# Patient Record
Sex: Female | Born: 1950 | Race: White | Hispanic: No | Marital: Married | Smoking: Former smoker
Health system: Southern US, Community
[De-identification: ages and names within clinical notes are randomized; demographics above are authoritative.]

## PROBLEM LIST (undated history)

## (undated) DIAGNOSIS — K5792 Diverticulitis of intestine, part unspecified, without perforation or abscess without bleeding: Secondary | ICD-10-CM

## (undated) DIAGNOSIS — R3915 Urgency of urination: Secondary | ICD-10-CM

## (undated) DIAGNOSIS — G43909 Migraine, unspecified, not intractable, without status migrainosus: Secondary | ICD-10-CM

## (undated) DIAGNOSIS — R05 Cough: Principal | ICD-10-CM

## (undated) DIAGNOSIS — N281 Cyst of kidney, acquired: Secondary | ICD-10-CM

## (undated) DIAGNOSIS — F419 Anxiety disorder, unspecified: Secondary | ICD-10-CM

## (undated) DIAGNOSIS — N390 Urinary tract infection, site not specified: Secondary | ICD-10-CM

## (undated) DIAGNOSIS — M25562 Pain in left knee: Secondary | ICD-10-CM

## (undated) DIAGNOSIS — K5790 Diverticulosis of intestine, part unspecified, without perforation or abscess without bleeding: Secondary | ICD-10-CM

## (undated) DIAGNOSIS — K589 Irritable bowel syndrome without diarrhea: Secondary | ICD-10-CM

## (undated) DIAGNOSIS — B977 Papillomavirus as the cause of diseases classified elsewhere: Secondary | ICD-10-CM

## (undated) DIAGNOSIS — D126 Benign neoplasm of colon, unspecified: Secondary | ICD-10-CM

## (undated) DIAGNOSIS — K219 Gastro-esophageal reflux disease without esophagitis: Secondary | ICD-10-CM

## (undated) DIAGNOSIS — M1990 Unspecified osteoarthritis, unspecified site: Secondary | ICD-10-CM

## (undated) DIAGNOSIS — N200 Calculus of kidney: Secondary | ICD-10-CM

## (undated) DIAGNOSIS — D689 Coagulation defect, unspecified: Secondary | ICD-10-CM

## (undated) DIAGNOSIS — M25561 Pain in right knee: Secondary | ICD-10-CM

## (undated) DIAGNOSIS — T4145XA Adverse effect of unspecified anesthetic, initial encounter: Secondary | ICD-10-CM

## (undated) DIAGNOSIS — IMO0001 Reserved for inherently not codable concepts without codable children: Secondary | ICD-10-CM

## (undated) DIAGNOSIS — T7840XA Allergy, unspecified, initial encounter: Secondary | ICD-10-CM

## (undated) DIAGNOSIS — K649 Unspecified hemorrhoids: Secondary | ICD-10-CM

## (undated) DIAGNOSIS — N319 Neuromuscular dysfunction of bladder, unspecified: Secondary | ICD-10-CM

## (undated) DIAGNOSIS — Z9889 Other specified postprocedural states: Secondary | ICD-10-CM

## (undated) DIAGNOSIS — Z87442 Personal history of urinary calculi: Secondary | ICD-10-CM

## (undated) DIAGNOSIS — C801 Malignant (primary) neoplasm, unspecified: Secondary | ICD-10-CM

## (undated) DIAGNOSIS — D649 Anemia, unspecified: Secondary | ICD-10-CM

## (undated) DIAGNOSIS — A419 Sepsis, unspecified organism: Secondary | ICD-10-CM

## (undated) DIAGNOSIS — F32A Depression, unspecified: Secondary | ICD-10-CM

## (undated) DIAGNOSIS — E785 Hyperlipidemia, unspecified: Secondary | ICD-10-CM

## (undated) DIAGNOSIS — L709 Acne, unspecified: Secondary | ICD-10-CM

## (undated) DIAGNOSIS — Z780 Asymptomatic menopausal state: Secondary | ICD-10-CM

## (undated) DIAGNOSIS — T8859XA Other complications of anesthesia, initial encounter: Secondary | ICD-10-CM

## (undated) DIAGNOSIS — Z85820 Personal history of malignant melanoma of skin: Secondary | ICD-10-CM

## (undated) DIAGNOSIS — B029 Zoster without complications: Secondary | ICD-10-CM

## (undated) DIAGNOSIS — M779 Enthesopathy, unspecified: Secondary | ICD-10-CM

## (undated) DIAGNOSIS — E079 Disorder of thyroid, unspecified: Secondary | ICD-10-CM

## (undated) DIAGNOSIS — R112 Nausea with vomiting, unspecified: Secondary | ICD-10-CM

## (undated) HISTORY — DX: Pain in left knee: M25.562

## (undated) HISTORY — DX: Pain in right knee: M25.561

## (undated) HISTORY — DX: Acne, unspecified: L70.9

## (undated) HISTORY — DX: Diverticulitis of intestine, part unspecified, without perforation or abscess without bleeding: K57.92

## (undated) HISTORY — DX: Unspecified osteoarthritis, unspecified site: M19.90

## (undated) HISTORY — DX: Unspecified hemorrhoids: K64.9

## (undated) HISTORY — PX: JOINT REPLACEMENT: SHX530

## (undated) HISTORY — DX: Zoster without complications: B02.9

## (undated) HISTORY — PX: SPINE SURGERY: SHX786

## (undated) HISTORY — DX: Papillomavirus as the cause of diseases classified elsewhere: B97.7

## (undated) HISTORY — DX: Asymptomatic menopausal state: Z78.0

## (undated) HISTORY — DX: Allergy, unspecified, initial encounter: T78.40XA

## (undated) HISTORY — DX: Calculus of kidney: N20.0

## (undated) HISTORY — DX: Hyperlipidemia, unspecified: E78.5

## (undated) HISTORY — DX: Gastro-esophageal reflux disease without esophagitis: K21.9

## (undated) HISTORY — PX: CARPAL TUNNEL RELEASE: SHX101

## (undated) HISTORY — DX: Urinary tract infection, site not specified: N39.0

## (undated) HISTORY — DX: Depression, unspecified: F32.A

## (undated) HISTORY — DX: Coagulation defect, unspecified: D68.9

## (undated) HISTORY — PX: TRIGGER FINGER RELEASE: SHX641

## (undated) HISTORY — DX: Diverticulosis of intestine, part unspecified, without perforation or abscess without bleeding: K57.90

## (undated) HISTORY — DX: Anxiety disorder, unspecified: F41.9

## (undated) HISTORY — DX: Malignant (primary) neoplasm, unspecified: C80.1

## (undated) HISTORY — DX: Anemia, unspecified: D64.9

## (undated) HISTORY — DX: Disorder of thyroid, unspecified: E07.9

## (undated) HISTORY — DX: Irritable bowel syndrome, unspecified: K58.9

## (undated) HISTORY — PX: HEMORRHOID BANDING: SHX5850

## (undated) HISTORY — DX: Benign neoplasm of colon, unspecified: D12.6

## (undated) HISTORY — DX: Migraine, unspecified, not intractable, without status migrainosus: G43.909

---

## 1985-10-30 HISTORY — PX: FOOT SURGERY: SHX648

## 1988-01-29 HISTORY — PX: TUBAL LIGATION: SHX77

## 1990-10-30 HISTORY — PX: NASAL SEPTUM SURGERY: SHX37

## 1991-10-31 HISTORY — PX: LUMBAR LAMINECTOMY: SHX95

## 1991-10-31 HISTORY — PX: BACK SURGERY: SHX140

## 1999-06-06 ENCOUNTER — Other Ambulatory Visit: Admission: RE | Admit: 1999-06-06 | Discharge: 1999-06-06 | Payer: Self-pay | Admitting: Gynecology

## 2000-06-19 ENCOUNTER — Other Ambulatory Visit: Admission: RE | Admit: 2000-06-19 | Discharge: 2000-06-19 | Payer: Self-pay | Admitting: Gynecology

## 2000-07-06 ENCOUNTER — Other Ambulatory Visit: Admission: RE | Admit: 2000-07-06 | Discharge: 2000-07-06 | Payer: Self-pay | Admitting: Gynecology

## 2000-07-06 ENCOUNTER — Encounter (INDEPENDENT_AMBULATORY_CARE_PROVIDER_SITE_OTHER): Payer: Self-pay

## 2000-11-05 ENCOUNTER — Other Ambulatory Visit: Admission: RE | Admit: 2000-11-05 | Discharge: 2000-11-05 | Payer: Self-pay | Admitting: Gynecology

## 2001-07-22 ENCOUNTER — Other Ambulatory Visit: Admission: RE | Admit: 2001-07-22 | Discharge: 2001-07-22 | Payer: Self-pay | Admitting: Gynecology

## 2001-12-31 ENCOUNTER — Other Ambulatory Visit: Admission: RE | Admit: 2001-12-31 | Discharge: 2001-12-31 | Payer: Self-pay | Admitting: Gynecology

## 2002-04-03 ENCOUNTER — Other Ambulatory Visit: Admission: RE | Admit: 2002-04-03 | Discharge: 2002-04-03 | Payer: Self-pay | Admitting: Gynecology

## 2002-08-29 ENCOUNTER — Encounter: Admission: RE | Admit: 2002-08-29 | Discharge: 2002-08-29 | Payer: Self-pay | Admitting: Family Medicine

## 2002-08-29 ENCOUNTER — Encounter: Payer: Self-pay | Admitting: Family Medicine

## 2002-09-02 ENCOUNTER — Other Ambulatory Visit: Admission: RE | Admit: 2002-09-02 | Discharge: 2002-09-02 | Payer: Self-pay | Admitting: Gynecology

## 2002-10-30 DIAGNOSIS — D126 Benign neoplasm of colon, unspecified: Secondary | ICD-10-CM

## 2002-10-30 HISTORY — DX: Benign neoplasm of colon, unspecified: D12.6

## 2003-09-16 ENCOUNTER — Other Ambulatory Visit: Admission: RE | Admit: 2003-09-16 | Discharge: 2003-09-16 | Payer: Self-pay | Admitting: Gynecology

## 2003-10-31 HISTORY — PX: KIDNEY STONE SURGERY: SHX686

## 2003-11-23 ENCOUNTER — Inpatient Hospital Stay (HOSPITAL_COMMUNITY): Admission: AD | Admit: 2003-11-23 | Discharge: 2003-11-27 | Payer: Self-pay | Admitting: Family Medicine

## 2003-12-29 HISTORY — PX: KIDNEY STONE SURGERY: SHX686

## 2004-01-04 ENCOUNTER — Emergency Department (HOSPITAL_COMMUNITY): Admission: EM | Admit: 2004-01-04 | Discharge: 2004-01-04 | Payer: Self-pay | Admitting: Emergency Medicine

## 2004-01-06 ENCOUNTER — Ambulatory Visit (HOSPITAL_BASED_OUTPATIENT_CLINIC_OR_DEPARTMENT_OTHER): Admission: RE | Admit: 2004-01-06 | Discharge: 2004-01-06 | Payer: Self-pay | Admitting: Urology

## 2004-08-17 ENCOUNTER — Ambulatory Visit: Payer: Self-pay | Admitting: Family Medicine

## 2004-09-08 ENCOUNTER — Ambulatory Visit: Payer: Self-pay | Admitting: Family Medicine

## 2004-09-15 ENCOUNTER — Ambulatory Visit: Payer: Self-pay

## 2004-09-19 ENCOUNTER — Other Ambulatory Visit: Admission: RE | Admit: 2004-09-19 | Discharge: 2004-09-19 | Payer: Self-pay | Admitting: Gynecology

## 2004-11-09 ENCOUNTER — Ambulatory Visit: Payer: Self-pay | Admitting: Family Medicine

## 2004-11-18 ENCOUNTER — Ambulatory Visit: Payer: Self-pay | Admitting: Family Medicine

## 2005-01-06 ENCOUNTER — Other Ambulatory Visit: Admission: RE | Admit: 2005-01-06 | Discharge: 2005-01-06 | Payer: Self-pay | Admitting: Gynecology

## 2005-01-30 ENCOUNTER — Ambulatory Visit: Payer: Self-pay | Admitting: Internal Medicine

## 2005-03-23 ENCOUNTER — Ambulatory Visit: Payer: Self-pay | Admitting: Family Medicine

## 2005-03-23 ENCOUNTER — Encounter: Admission: RE | Admit: 2005-03-23 | Discharge: 2005-03-23 | Payer: Self-pay | Admitting: Family Medicine

## 2005-04-28 ENCOUNTER — Ambulatory Visit: Payer: Self-pay | Admitting: Family Medicine

## 2005-06-19 ENCOUNTER — Ambulatory Visit: Payer: Self-pay | Admitting: Family Medicine

## 2005-07-04 ENCOUNTER — Ambulatory Visit: Payer: Self-pay | Admitting: Family Medicine

## 2005-10-09 ENCOUNTER — Other Ambulatory Visit: Admission: RE | Admit: 2005-10-09 | Discharge: 2005-10-09 | Payer: Self-pay | Admitting: Gynecology

## 2005-10-10 ENCOUNTER — Encounter: Admission: RE | Admit: 2005-10-10 | Discharge: 2005-10-10 | Payer: Self-pay | Admitting: Family Medicine

## 2005-10-30 HISTORY — PX: MELANOMA EXCISION: SHX5266

## 2005-10-30 HISTORY — PX: LITHOTRIPSY: SUR834

## 2005-11-22 IMAGING — CT CT ABDOMEN W/ CM
2 of 3 series · 13 of 32 positions shown, 19 images · IV contrast (omnipaque)
Comparison: none

CLINICAL DATA: Fever, nausea, vomiting, and abdominal pain.  Sinus drainage.
 LIMITED CT OF THE PARANASAL SINUSES
 Prone coronal images of the paranasal sinuses demonstrate very minimal mucosal thickening of the right maxillary sinus.  There is an air fluid level in the left maxillary sinus.  There is mucosal thickening of the left ostiomeatal unit and some mucosal thickening in the left ethmoid sinus.  No bone abnormality is present.  The sphenoid and frontal sinuses are normal.
 IMPRESSION
 1.  Air fluid level in the left maxillary sinus with mucosal disease in the left ethmoid and right maxillary sinuses.
 2.  Considerable mucosal change of the ostiomeatal unit on the left.
 CT SCAN OF THE ABDOMEN WITH CONTRAST
 Multiple spiral images were made through abdomen after intravenous injection of 150 cc of Omnipaque 300.  Lung bases are normal.  There is some linear scarring.  There are subcentimeter low density lesions of the medial and lateral segments of the left lobe of the liver.  There is a single subcentimeter lesion of the superior aspect of the left kidney.  There is no adenopathy, free air, or free fluid.  The spleen, pancreas and adrenal glands are normal.  There is no abnormality of the kidneys other than the small cyst.  There is no inflammatory change within the abdomen.  
 Negative CT scan of the abdomen with contrast.  No evidence for diverticulitis or acute abnormality.
 CT SCAN OF THE PELVIS WITH CONTRAST
 Additional images through the pelvis after oral and intravenous contrast demonstrate no diverticulitis.  There is no free air or free fluid.  The appendix is normal.  The uterus is normal.  There is a 1.9 cm cyst of the left ovary.  The sigmoid colon is unremarkable.
 Negative CT scan of the pelvis with contrast.  No evidence for active diverticulitis.

[Series 4: sinus ltd 2.5 h70h · axial · 0.18mm/px · z∈[-165,-75]mm · 9 of 44 slices shown, 15 images (1 of 2)]
[im 5/44  soft-tissue]
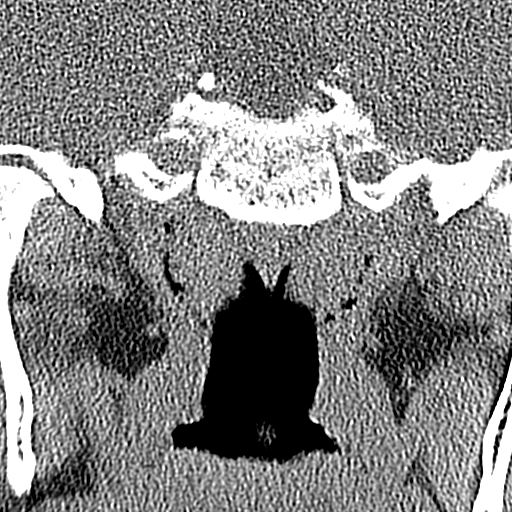
[im 5/44  bone]
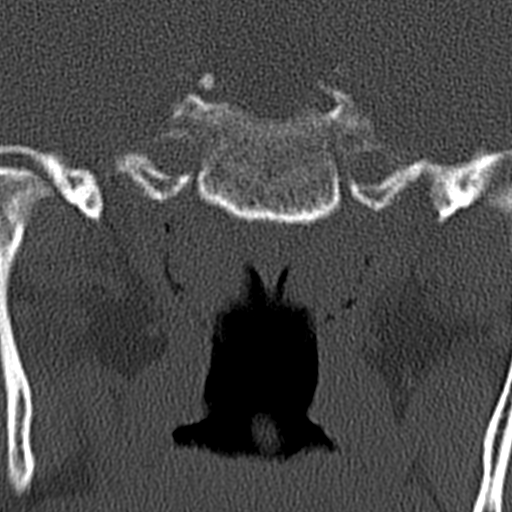
[im 9/44  soft-tissue]
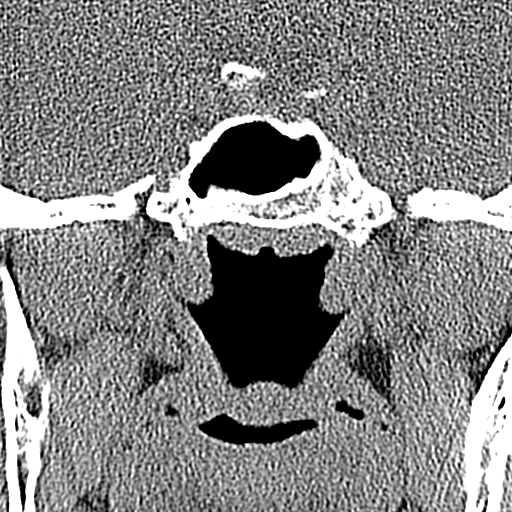
[im 13/44  soft-tissue]
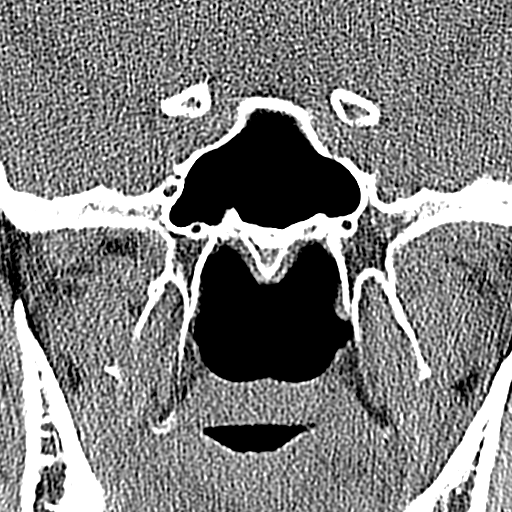
[im 18/44  soft-tissue]
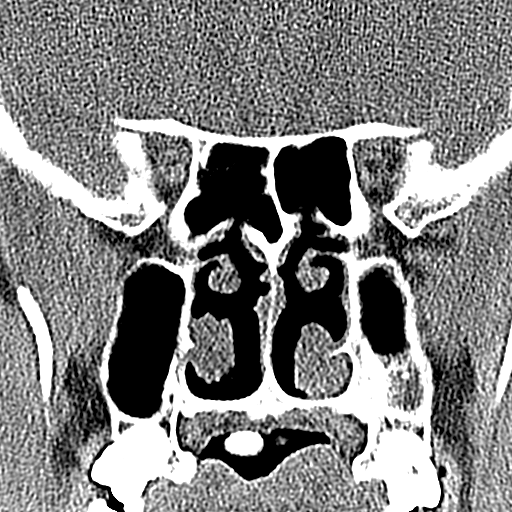
[im 22/44  soft-tissue]
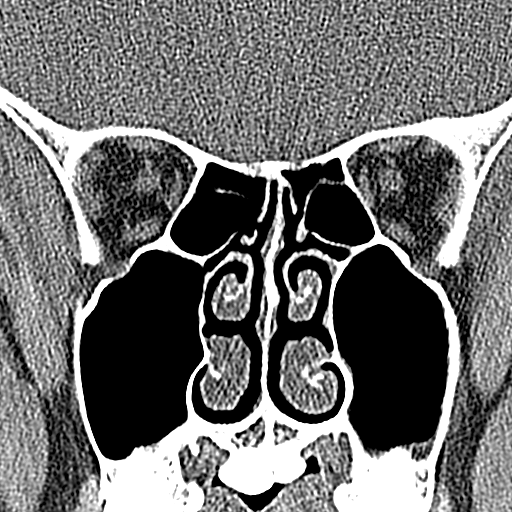
[im 26/44  soft-tissue]
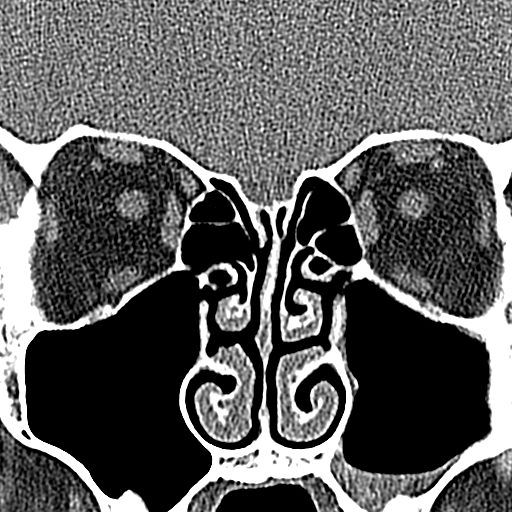
[im 26/44  lung]
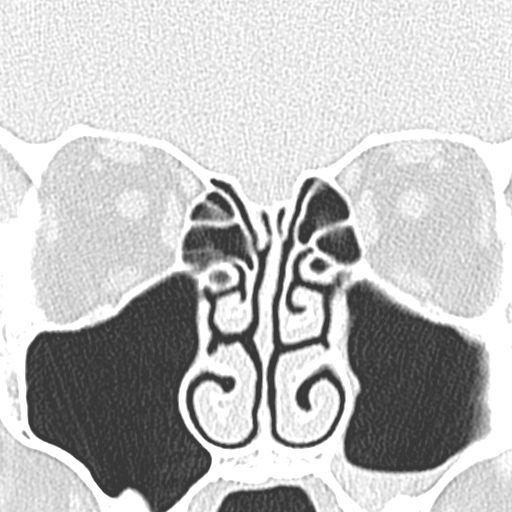
[im 31/44  soft-tissue]
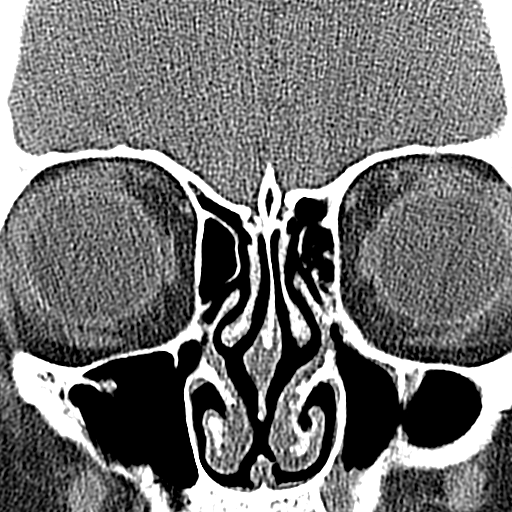
[im 31/44  lung]
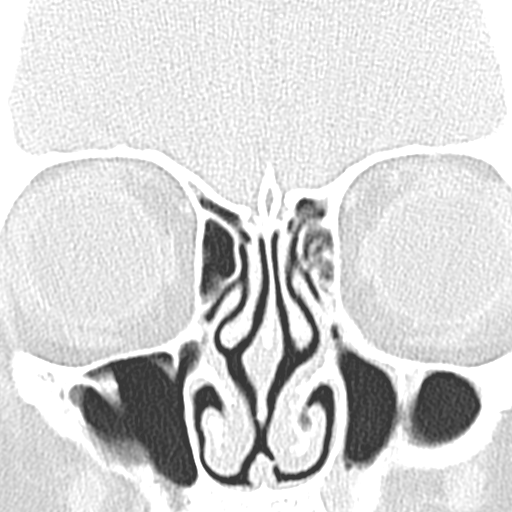
[im 35/44  soft-tissue]
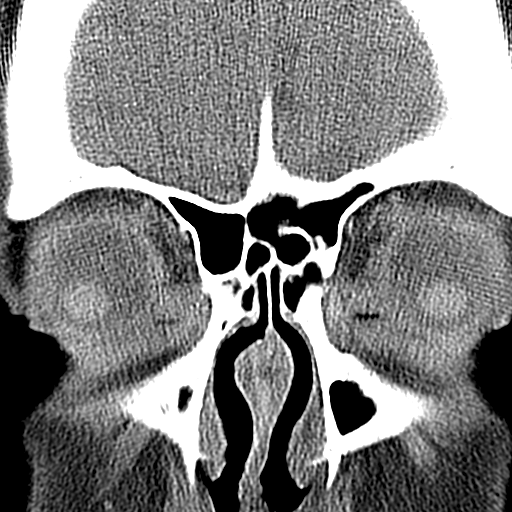
[im 35/44  lung]
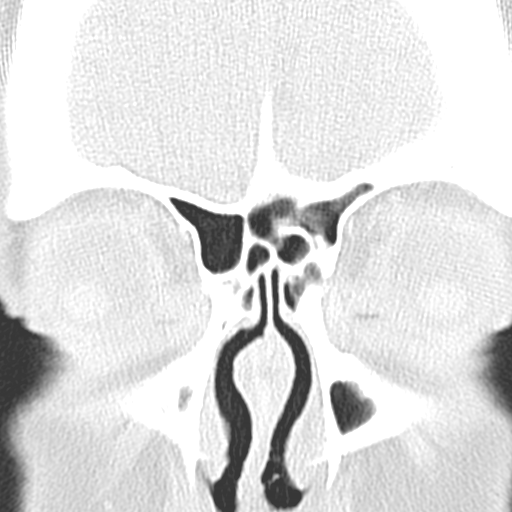
[im 39/44  soft-tissue]
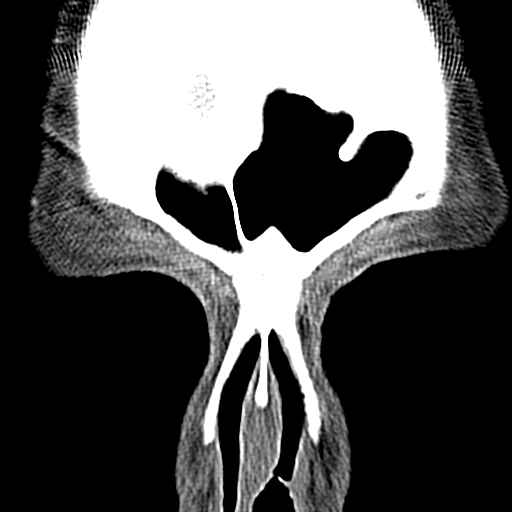
[im 39/44  lung]
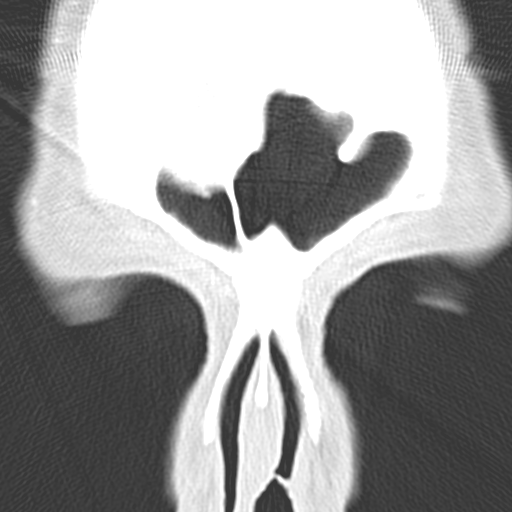
[im 39/44  bone]
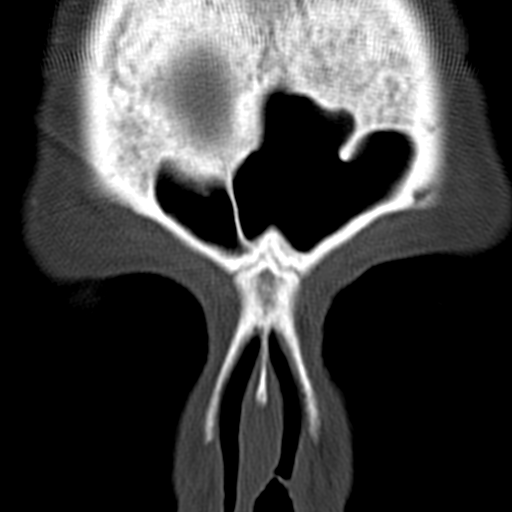

[Series 5: sinus ltd 2.5 h70h · axial · 0.18mm/px · z∈[-164,-130]mm · 4 of 44 slices shown (2 of 2)]
[im 5/44  soft-tissue]
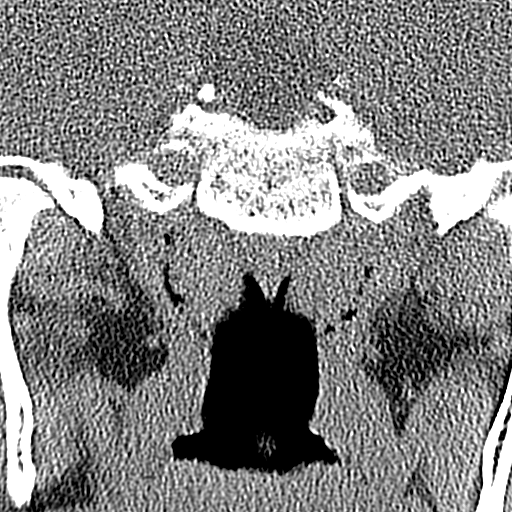
[im 9/44  soft-tissue]
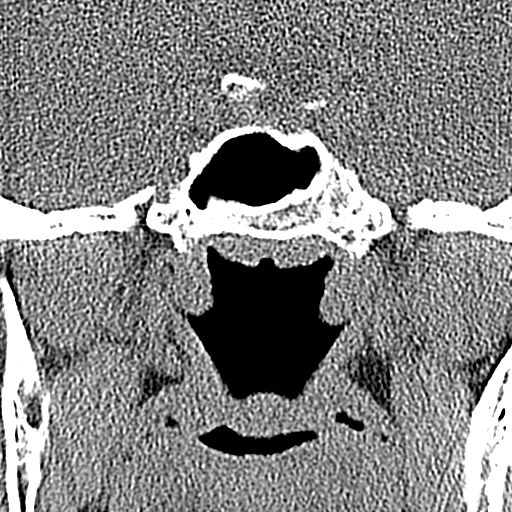
[im 13/44  soft-tissue]
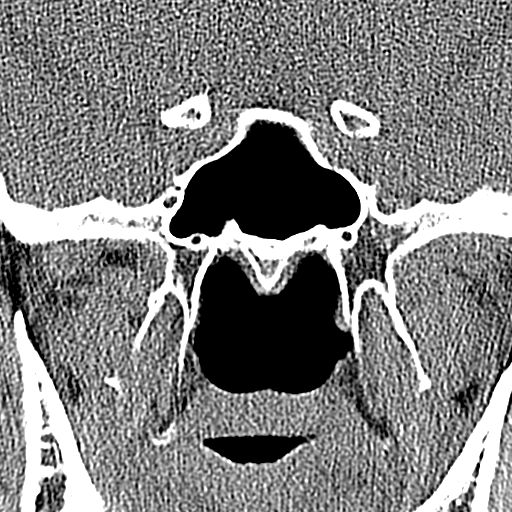
[im 18/44  soft-tissue]
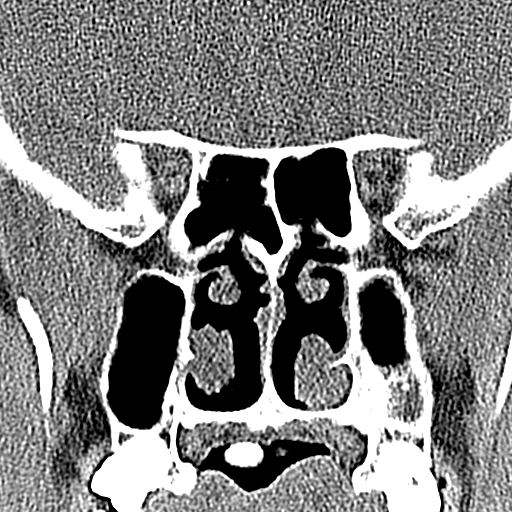

[13 of 32 positions shown; findings below may reference images not displayed]

## 2005-12-21 ENCOUNTER — Ambulatory Visit: Payer: Self-pay | Admitting: Family Medicine

## 2006-01-28 DIAGNOSIS — D0361 Melanoma in situ of right upper limb, including shoulder: Secondary | ICD-10-CM | POA: Insufficient documentation

## 2006-02-06 ENCOUNTER — Emergency Department (HOSPITAL_COMMUNITY): Admission: EM | Admit: 2006-02-06 | Discharge: 2006-02-07 | Payer: Self-pay | Admitting: Emergency Medicine

## 2006-02-07 ENCOUNTER — Ambulatory Visit (HOSPITAL_BASED_OUTPATIENT_CLINIC_OR_DEPARTMENT_OTHER): Admission: RE | Admit: 2006-02-07 | Discharge: 2006-02-07 | Payer: Self-pay | Admitting: Urology

## 2006-02-08 ENCOUNTER — Ambulatory Visit (HOSPITAL_COMMUNITY): Admission: AD | Admit: 2006-02-08 | Discharge: 2006-02-08 | Payer: Self-pay | Admitting: Urology

## 2006-08-13 ENCOUNTER — Ambulatory Visit: Payer: Self-pay | Admitting: Family Medicine

## 2006-09-03 ENCOUNTER — Ambulatory Visit: Payer: Self-pay | Admitting: Family Medicine

## 2006-10-05 ENCOUNTER — Ambulatory Visit: Payer: Self-pay | Admitting: Family Medicine

## 2006-10-05 LAB — CONVERTED CEMR LAB
ALT: 25 units/L (ref 0–40)
AST: 24 units/L (ref 0–37)
Albumin: 4 g/dL (ref 3.5–5.2)
Alkaline Phosphatase: 51 units/L (ref 39–117)
BUN: 18 mg/dL (ref 6–23)
Basophils Absolute: 0 10*3/uL (ref 0.0–0.1)
Basophils Relative: 0.3 % (ref 0.0–1.0)
CO2: 30 meq/L (ref 19–32)
Calcium: 9.5 mg/dL (ref 8.4–10.5)
Chloride: 107 meq/L (ref 96–112)
Chol/HDL Ratio, serum: 4.8
Cholesterol: 228 mg/dL (ref 0–200)
Creatinine, Ser: 0.9 mg/dL (ref 0.4–1.2)
Eosinophil percent: 1.2 % (ref 0.0–5.0)
GFR calc non Af Amer: 69 mL/min
Glomerular Filtration Rate, Af Am: 84 mL/min/{1.73_m2}
Glucose, Bld: 87 mg/dL (ref 70–99)
HCT: 44.4 % (ref 36.0–46.0)
HDL: 47.6 mg/dL (ref >39.0)
Hemoglobin: 14.5 g/dL (ref 12.0–15.0)
LDL DIRECT: 177.8 mg/dL
Lymphocytes Relative: 30.6 % (ref 12.0–46.0)
MCHC: 32.7 g/dL (ref 30.0–36.0)
MCV: 91.6 fL (ref 78.0–100.0)
Monocytes Absolute: 0.6 10*3/uL (ref 0.2–0.7)
Monocytes Relative: 11.7 % — ABNORMAL HIGH (ref 3.0–11.0)
Neutro Abs: 2.7 10*3/uL (ref 1.4–7.7)
Neutrophils Relative %: 56.2 % (ref 43.0–77.0)
Platelets: 210 10*3/uL (ref 150–400)
Potassium: 4.5 meq/L (ref 3.5–5.1)
RBC: 4.85 M/uL (ref 3.87–5.11)
RDW: 12.6 % (ref 11.5–14.6)
Sodium: 144 meq/L (ref 135–145)
TSH: 2.64 microintl units/mL (ref 0.35–5.50)
Total Bilirubin: 0.8 mg/dL (ref 0.3–1.2)
Total Protein: 6.9 g/dL (ref 6.0–8.3)
Triglyceride fasting, serum: 91 mg/dL (ref 0–149)
VLDL: 18 mg/dL (ref 0–40)
WBC: 4.9 10*3/uL (ref 4.5–10.5)

## 2006-10-10 ENCOUNTER — Other Ambulatory Visit: Admission: RE | Admit: 2006-10-10 | Discharge: 2006-10-10 | Payer: Self-pay | Admitting: Gynecology

## 2006-10-12 ENCOUNTER — Ambulatory Visit: Payer: Self-pay | Admitting: Family Medicine

## 2006-11-28 ENCOUNTER — Ambulatory Visit: Payer: Self-pay | Admitting: Internal Medicine

## 2006-12-18 ENCOUNTER — Ambulatory Visit: Payer: Self-pay | Admitting: Internal Medicine

## 2007-06-19 DIAGNOSIS — K219 Gastro-esophageal reflux disease without esophagitis: Secondary | ICD-10-CM | POA: Insufficient documentation

## 2007-06-19 DIAGNOSIS — J309 Allergic rhinitis, unspecified: Secondary | ICD-10-CM | POA: Insufficient documentation

## 2007-07-16 ENCOUNTER — Encounter: Payer: Self-pay | Admitting: Family Medicine

## 2007-07-22 ENCOUNTER — Encounter: Payer: Self-pay | Admitting: Family Medicine

## 2007-07-22 ENCOUNTER — Ambulatory Visit: Payer: Self-pay | Admitting: Internal Medicine

## 2007-07-31 ENCOUNTER — Telehealth: Payer: Self-pay | Admitting: Family Medicine

## 2007-10-02 ENCOUNTER — Telehealth: Payer: Self-pay | Admitting: Family Medicine

## 2007-10-14 ENCOUNTER — Other Ambulatory Visit: Admission: RE | Admit: 2007-10-14 | Discharge: 2007-10-14 | Payer: Self-pay | Admitting: Gynecology

## 2007-10-17 ENCOUNTER — Ambulatory Visit: Payer: Self-pay | Admitting: Family Medicine

## 2007-10-17 DIAGNOSIS — R51 Headache: Secondary | ICD-10-CM

## 2007-10-17 DIAGNOSIS — R519 Headache, unspecified: Secondary | ICD-10-CM | POA: Insufficient documentation

## 2007-11-08 ENCOUNTER — Ambulatory Visit: Payer: Self-pay | Admitting: Family Medicine

## 2007-11-08 LAB — CONVERTED CEMR LAB
Bilirubin Urine: NEGATIVE
Blood in Urine, dipstick: NEGATIVE
Glucose, Urine, Semiquant: NEGATIVE
Ketones, urine, test strip: NEGATIVE
Nitrite: NEGATIVE
Protein, U semiquant: NEGATIVE
Specific Gravity, Urine: 1.015
Urobilinogen, UA: 0.2
WBC Urine, dipstick: NEGATIVE
pH: 7.5

## 2007-11-12 LAB — CONVERTED CEMR LAB
ALT: 19 units/L (ref 0–35)
AST: 19 units/L (ref 0–37)
Albumin: 3.9 g/dL (ref 3.5–5.2)
Alkaline Phosphatase: 59 units/L (ref 39–117)
BUN: 17 mg/dL (ref 6–23)
Basophils Absolute: 0 10*3/uL (ref 0.0–0.1)
Basophils Relative: 0 % (ref 0.0–1.0)
Bilirubin, Direct: 0.1 mg/dL (ref 0.0–0.3)
CO2: 29 meq/L (ref 19–32)
Calcium: 9.5 mg/dL (ref 8.4–10.5)
Chloride: 105 meq/L (ref 96–112)
Cholesterol: 236 mg/dL (ref 0–200)
Creatinine, Ser: 0.7 mg/dL (ref 0.4–1.2)
Direct LDL: 190.8 mg/dL
Eosinophils Absolute: 0.1 10*3/uL (ref 0.0–0.6)
Eosinophils Relative: 1.3 % (ref 0.0–5.0)
GFR calc Af Amer: 112 mL/min
GFR calc non Af Amer: 92 mL/min
Glucose, Bld: 90 mg/dL (ref 70–99)
HCT: 40.6 % (ref 36.0–46.0)
HDL: 39.5 mg/dL (ref >39.0)
Hemoglobin: 14.1 g/dL (ref 12.0–15.0)
Lymphocytes Relative: 31.7 % (ref 12.0–46.0)
MCHC: 34.8 g/dL (ref 30.0–36.0)
MCV: 88.4 fL (ref 78.0–100.0)
Monocytes Absolute: 0.6 10*3/uL (ref 0.2–0.7)
Monocytes Relative: 13 % — ABNORMAL HIGH (ref 3.0–11.0)
Neutro Abs: 2.7 10*3/uL (ref 1.4–7.7)
Neutrophils Relative %: 54 % (ref 43.0–77.0)
Platelets: 213 10*3/uL (ref 150–400)
Potassium: 4.9 meq/L (ref 3.5–5.1)
RBC: 4.59 M/uL (ref 3.87–5.11)
RDW: 12.2 % (ref 11.5–14.6)
Sodium: 140 meq/L (ref 135–145)
TSH: 2.41 microintl units/mL (ref 0.35–5.50)
Total Bilirubin: 0.9 mg/dL (ref 0.3–1.2)
Total CHOL/HDL Ratio: 6
Total Protein: 6.5 g/dL (ref 6.0–8.3)
Triglycerides: 80 mg/dL (ref 0–149)
VLDL: 16 mg/dL (ref 0–40)
WBC: 5 10*3/uL (ref 4.5–10.5)

## 2007-11-18 ENCOUNTER — Ambulatory Visit: Payer: Self-pay | Admitting: Family Medicine

## 2007-11-20 ENCOUNTER — Telehealth: Payer: Self-pay | Admitting: Family Medicine

## 2008-02-25 ENCOUNTER — Ambulatory Visit: Payer: Self-pay | Admitting: Family Medicine

## 2008-02-25 DIAGNOSIS — E785 Hyperlipidemia, unspecified: Secondary | ICD-10-CM | POA: Insufficient documentation

## 2008-02-27 LAB — CONVERTED CEMR LAB
Cholesterol: 228 mg/dL (ref 0–200)
Direct LDL: 179.2 mg/dL
HDL: 39.2 mg/dL (ref >39.0)
Total CHOL/HDL Ratio: 5.8
Triglycerides: 101 mg/dL (ref 0–149)
VLDL: 20 mg/dL (ref 0–40)

## 2008-03-22 ENCOUNTER — Emergency Department (HOSPITAL_COMMUNITY): Admission: EM | Admit: 2008-03-22 | Discharge: 2008-03-22 | Payer: Self-pay | Admitting: Emergency Medicine

## 2008-05-28 ENCOUNTER — Ambulatory Visit: Payer: Self-pay | Admitting: Family Medicine

## 2008-06-03 LAB — CONVERTED CEMR LAB
ALT: 23 units/L (ref 0–35)
AST: 22 units/L (ref 0–37)
Albumin: 3.7 g/dL (ref 3.5–5.2)
Alkaline Phosphatase: 58 units/L (ref 39–117)
Bilirubin, Direct: 0.1 mg/dL (ref 0.0–0.3)
Cholesterol: 159 mg/dL (ref 0–200)
HDL: 37.3 mg/dL — ABNORMAL LOW (ref >39.0)
LDL Cholesterol: 109 mg/dL — ABNORMAL HIGH (ref 0–99)
Total Bilirubin: 0.7 mg/dL (ref 0.3–1.2)
Total CHOL/HDL Ratio: 4.3
Total Protein: 6.2 g/dL (ref 6.0–8.3)
Triglycerides: 63 mg/dL (ref 0–149)
VLDL: 13 mg/dL (ref 0–40)

## 2008-06-16 ENCOUNTER — Encounter: Admission: RE | Admit: 2008-06-16 | Discharge: 2008-06-16 | Payer: Self-pay | Admitting: Sports Medicine

## 2008-07-27 ENCOUNTER — Encounter: Payer: Self-pay | Admitting: Family Medicine

## 2008-09-22 ENCOUNTER — Encounter: Admission: RE | Admit: 2008-09-22 | Discharge: 2008-09-22 | Payer: Self-pay | Admitting: Orthopedic Surgery

## 2008-10-01 ENCOUNTER — Ambulatory Visit: Payer: Self-pay | Admitting: Family Medicine

## 2008-10-05 LAB — CONVERTED CEMR LAB
Cholesterol: 240 mg/dL (ref 0–200)
Direct LDL: 173.2 mg/dL
HDL: 38.5 mg/dL — ABNORMAL LOW (ref >39.0)
Total CHOL/HDL Ratio: 6.2
Triglycerides: 128 mg/dL (ref 0–149)
VLDL: 26 mg/dL (ref 0–40)

## 2008-10-15 ENCOUNTER — Ambulatory Visit: Payer: Self-pay | Admitting: Gynecology

## 2008-10-15 ENCOUNTER — Encounter: Payer: Self-pay | Admitting: Gynecology

## 2008-10-15 ENCOUNTER — Other Ambulatory Visit: Admission: RE | Admit: 2008-10-15 | Discharge: 2008-10-15 | Payer: Self-pay | Admitting: Gynecology

## 2008-10-28 ENCOUNTER — Telehealth: Payer: Self-pay | Admitting: Family Medicine

## 2008-10-30 HISTORY — PX: TOTAL HIP ARTHROPLASTY: SHX124

## 2008-11-02 ENCOUNTER — Ambulatory Visit: Payer: Self-pay | Admitting: Family Medicine

## 2008-11-02 DIAGNOSIS — K5732 Diverticulitis of large intestine without perforation or abscess without bleeding: Secondary | ICD-10-CM | POA: Insufficient documentation

## 2008-11-11 ENCOUNTER — Ambulatory Visit: Payer: Self-pay | Admitting: Family Medicine

## 2008-11-11 LAB — CONVERTED CEMR LAB
Bilirubin Urine: NEGATIVE
Blood in Urine, dipstick: NEGATIVE
Glucose, Urine, Semiquant: NEGATIVE
Ketones, urine, test strip: NEGATIVE
Nitrite: NEGATIVE
Specific Gravity, Urine: 1.02
Urobilinogen, UA: 0.2
WBC Urine, dipstick: NEGATIVE
pH: 7

## 2008-11-12 LAB — CONVERTED CEMR LAB
ALT: 39 units/L — ABNORMAL HIGH (ref 0–35)
AST: 45 units/L — ABNORMAL HIGH (ref 0–37)
Albumin: 3.9 g/dL (ref 3.5–5.2)
Alkaline Phosphatase: 56 units/L (ref 39–117)
BUN: 20 mg/dL (ref 6–23)
Basophils Absolute: 0 10*3/uL (ref 0.0–0.1)
Basophils Relative: 0.4 % (ref 0.0–3.0)
Bilirubin, Direct: 0.1 mg/dL (ref 0.0–0.3)
CO2: 28 meq/L (ref 19–32)
Calcium: 9.4 mg/dL (ref 8.4–10.5)
Chloride: 107 meq/L (ref 96–112)
Cholesterol: 209 mg/dL (ref 0–200)
Creatinine, Ser: 0.7 mg/dL (ref 0.4–1.2)
Direct LDL: 146.1 mg/dL
Eosinophils Absolute: 0.1 10*3/uL (ref 0.0–0.7)
Eosinophils Relative: 1 % (ref 0.0–5.0)
GFR calc Af Amer: 111 mL/min
GFR calc non Af Amer: 92 mL/min
Glucose, Bld: 89 mg/dL (ref 70–99)
HCT: 41.5 % (ref 36.0–46.0)
HDL: 41.3 mg/dL (ref >39.0)
Hemoglobin: 14.3 g/dL (ref 12.0–15.0)
Lymphocytes Relative: 27.8 % (ref 12.0–46.0)
MCHC: 34.3 g/dL (ref 30.0–36.0)
MCV: 90.1 fL (ref 78.0–100.0)
Monocytes Absolute: 0.7 10*3/uL (ref 0.1–1.0)
Monocytes Relative: 11.3 % (ref 3.0–12.0)
Neutro Abs: 3.4 10*3/uL (ref 1.4–7.7)
Neutrophils Relative %: 59.5 % (ref 43.0–77.0)
Platelets: 227 10*3/uL (ref 150–400)
Potassium: 4.4 meq/L (ref 3.5–5.1)
RBC: 4.61 M/uL (ref 3.87–5.11)
RDW: 13.1 % (ref 11.5–14.6)
Sodium: 142 meq/L (ref 135–145)
TSH: 1.75 microintl units/mL (ref 0.35–5.50)
Total Bilirubin: 0.6 mg/dL (ref 0.3–1.2)
Total CHOL/HDL Ratio: 5.1
Total Protein: 6.9 g/dL (ref 6.0–8.3)
Triglycerides: 97 mg/dL (ref 0–149)
VLDL: 19 mg/dL (ref 0–40)
WBC: 5.8 10*3/uL (ref 4.5–10.5)

## 2008-11-18 ENCOUNTER — Ambulatory Visit: Payer: Self-pay | Admitting: Family Medicine

## 2008-11-30 HISTORY — PX: TOTAL HIP ARTHROPLASTY: SHX124

## 2008-12-08 ENCOUNTER — Telehealth: Payer: Self-pay | Admitting: Family Medicine

## 2008-12-16 ENCOUNTER — Inpatient Hospital Stay (HOSPITAL_COMMUNITY): Admission: RE | Admit: 2008-12-16 | Discharge: 2008-12-20 | Payer: Self-pay | Admitting: Orthopedic Surgery

## 2009-03-22 ENCOUNTER — Ambulatory Visit: Payer: Self-pay | Admitting: Gynecology

## 2009-08-04 ENCOUNTER — Ambulatory Visit: Payer: Self-pay | Admitting: Internal Medicine

## 2009-08-04 ENCOUNTER — Encounter: Payer: Self-pay | Admitting: Family Medicine

## 2009-08-18 ENCOUNTER — Encounter: Payer: Self-pay | Admitting: Family Medicine

## 2009-08-25 ENCOUNTER — Encounter: Payer: Self-pay | Admitting: Internal Medicine

## 2009-09-29 ENCOUNTER — Encounter: Admission: RE | Admit: 2009-09-29 | Discharge: 2009-09-29 | Payer: Self-pay | Admitting: Orthopedic Surgery

## 2009-10-27 ENCOUNTER — Ambulatory Visit: Payer: Self-pay | Admitting: Gynecology

## 2009-10-27 ENCOUNTER — Other Ambulatory Visit: Admission: RE | Admit: 2009-10-27 | Discharge: 2009-10-27 | Payer: Self-pay | Admitting: Gynecology

## 2010-01-19 ENCOUNTER — Ambulatory Visit: Payer: Self-pay | Admitting: Family Medicine

## 2010-01-19 LAB — CONVERTED CEMR LAB
Bilirubin Urine: NEGATIVE
Blood in Urine, dipstick: NEGATIVE
Glucose, Urine, Semiquant: NEGATIVE
Ketones, urine, test strip: NEGATIVE
Nitrite: NEGATIVE
Protein, U semiquant: NEGATIVE
Specific Gravity, Urine: 1.015
Urobilinogen, UA: 0.2
pH: 7

## 2010-01-21 LAB — CONVERTED CEMR LAB
ALT: 22 units/L (ref 0–35)
AST: 25 units/L (ref 0–37)
Albumin: 3.9 g/dL (ref 3.5–5.2)
Alkaline Phosphatase: 59 units/L (ref 39–117)
BUN: 16 mg/dL (ref 6–23)
Basophils Absolute: 0 10*3/uL (ref 0.0–0.1)
Basophils Relative: 0.2 % (ref 0.0–3.0)
Bilirubin, Direct: 0 mg/dL (ref 0.0–0.3)
CO2: 30 meq/L (ref 19–32)
Calcium: 9.5 mg/dL (ref 8.4–10.5)
Chloride: 110 meq/L (ref 96–112)
Cholesterol: 217 mg/dL — ABNORMAL HIGH (ref 0–200)
Creatinine, Ser: 0.8 mg/dL (ref 0.4–1.2)
Direct LDL: 165 mg/dL
Eosinophils Absolute: 0.1 10*3/uL (ref 0.0–0.7)
Eosinophils Relative: 1.9 % (ref 0.0–5.0)
GFR calc non Af Amer: 78.38 mL/min (ref >60)
Glucose, Bld: 92 mg/dL (ref 70–99)
HCT: 41.7 % (ref 36.0–46.0)
HDL: 43.9 mg/dL (ref >39.00)
Hemoglobin: 14 g/dL (ref 12.0–15.0)
Lymphocytes Relative: 23.2 % (ref 12.0–46.0)
Lymphs Abs: 1.3 10*3/uL (ref 0.7–4.0)
MCHC: 33.5 g/dL (ref 30.0–36.0)
MCV: 92.3 fL (ref 78.0–100.0)
Monocytes Absolute: 0.6 10*3/uL (ref 0.1–1.0)
Monocytes Relative: 10.4 % (ref 3.0–12.0)
Neutro Abs: 3.6 10*3/uL (ref 1.4–7.7)
Neutrophils Relative %: 64.3 % (ref 43.0–77.0)
Platelets: 194 10*3/uL (ref 150.0–400.0)
Potassium: 4.6 meq/L (ref 3.5–5.1)
RBC: 4.51 M/uL (ref 3.87–5.11)
RDW: 12.5 % (ref 11.5–14.6)
Sodium: 145 meq/L (ref 135–145)
TSH: 2.1 microintl units/mL (ref 0.35–5.50)
Total Bilirubin: 0.4 mg/dL (ref 0.3–1.2)
Total CHOL/HDL Ratio: 5
Total Protein: 6.8 g/dL (ref 6.0–8.3)
Triglycerides: 142 mg/dL (ref 0.0–149.0)
VLDL: 28.4 mg/dL (ref 0.0–40.0)
WBC: 5.6 10*3/uL (ref 4.5–10.5)

## 2010-01-26 ENCOUNTER — Ambulatory Visit: Payer: Self-pay | Admitting: Family Medicine

## 2010-01-26 DIAGNOSIS — M1990 Unspecified osteoarthritis, unspecified site: Secondary | ICD-10-CM | POA: Insufficient documentation

## 2010-01-26 DIAGNOSIS — Z87442 Personal history of urinary calculi: Secondary | ICD-10-CM | POA: Insufficient documentation

## 2010-03-28 ENCOUNTER — Emergency Department (HOSPITAL_COMMUNITY): Admission: EM | Admit: 2010-03-28 | Discharge: 2010-03-28 | Payer: Self-pay | Admitting: Emergency Medicine

## 2010-08-19 ENCOUNTER — Ambulatory Visit: Payer: Self-pay | Admitting: Family Medicine

## 2010-10-30 DIAGNOSIS — IMO0001 Reserved for inherently not codable concepts without codable children: Secondary | ICD-10-CM

## 2010-10-30 HISTORY — DX: Reserved for inherently not codable concepts without codable children: IMO0001

## 2010-11-02 ENCOUNTER — Encounter: Payer: Self-pay | Admitting: Family Medicine

## 2010-11-29 NOTE — Assessment & Plan Note (Signed)
Summary: Ongoing diarrhea X 12 days/nn   Vital Signs:  Patient Profile:   60 Years Old Female Height:     67 inches Weight:      180 pounds Temp:     98.4 degrees F oral Pulse rate:   85 / minute BP sitting:   130 / 82  (left arm) Cuff size:   regular  Vitals Entered By: Alfred Levins, CMA (November 02, 2008 10:52 AM)                 Chief Complaint:  diarrhea x12 days.  History of Present Illness: Here for 12 days of lower abdominal cramps and watery diarrhea. No nausea or fever. No recent antibiotics or travel. Lomotil helps temporarily. She is scheduled to have a right hip replacement per Dr. Lequita Halt on 12-16-08.     Current Allergies: No known allergies   Past Medical History:    Reviewed history from 11/18/2007 and no changes required:       PMS (sees Dr. Audie Box)       Acne (sees Dr. Lonni Fix)       migraines       Allergic rhinitis       GERD       frequent UTI's (sees Dr. Wanda Plump)       normal cardiac stress test 11-05  Past Surgical History:    Reviewed history from 11/18/2007 and no changes required:       Melanoma L upper Arm, per Dr. Lonni Fix       Foot surgery       nasal septra surgery       lithotripsy       lumbar disc surgery X 2       colonoscopy 2-08 per Dr. Marina Goodell     Review of Systems  The patient denies anorexia, fever, weight loss, weight gain, vision loss, decreased hearing, hoarseness, chest pain, syncope, dyspnea on exertion, peripheral edema, prolonged cough, headaches, hemoptysis, melena, hematochezia, severe indigestion/heartburn, hematuria, incontinence, genital sores, muscle weakness, suspicious skin lesions, transient blindness, difficulty walking, depression, unusual weight change, abnormal bleeding, enlarged lymph nodes, angioedema, breast masses, and testicular masses.     Physical Exam  General:     Well-developed,well-nourished,in no acute distress; alert,appropriate and cooperative throughout examination Abdomen:     soft,  normal bowel sounds, no distention, no masses, no guarding, no rigidity, no rebound tenderness, no abdominal hernia, no inguinal hernia, no hepatomegaly, and no splenomegaly.  Mildly tender in both lower quadrants.     Impression & Recommendations:  Problem # 1:  DIVERTICULITIS, COLON (ICD-562.11)  Complete Medication List: 1)  Nadolol 20 Mg Tabs (Nadolol) .Marland Kitchen.. 1 by mouth once daily 2)  Nitrofurantoin Monohyd Macro 100 Mg Caps (Nitrofurantoin monohyd macro) .... Two times a day as needed uti 3)  Rosula 10-5 % Gel (Sulfacetamide-sulfur in urea) .... Once daily 4)  Triaz Cleanser 3 % Lotn (Benzoyl peroxide) .... Once daily 5)  Betamethasone Valerate 0.1 % Lotn (Betamethasone valerate) .... Two times a day as needed 6)  Fioricet 50-325-40 Mg Tabs (Butalbital-apap-caffeine) .Marland Kitchen.. 1 every 4 hours as needed ha 7)  Benzac Ac 5 % Gel (Benzoyl peroxide) .... Once daily 8)  Corgard 20 Mg Tabs (Nadolol) .... Once daily 9)  Prednisone 10 Mg Tabs (Prednisone) .... As needed for mouth ulcers 10)  Ortho-est 0.625 0.75 Mg Tabs (Estropipate) .Marland Kitchen.. 1 by mouth every other day 11)  Lomotil 2.5-0.025 Mg Tabs (Diphenoxylate-atropine) .... Two tabs every 6 hours  as needed diarrhea. 12)  Metronidazole 500 Mg Tabs (Metronidazole) .... Two times a day   Patient Instructions: 1)  Please schedule a follow-up appointment as needed.   Prescriptions: METRONIDAZOLE 500 MG TABS (METRONIDAZOLE) two times a day  #20 x 0   Entered and Authorized by:   Nelwyn Salisbury MD   Signed by:   Nelwyn Salisbury MD on 11/02/2008   Method used:   Electronically to        [facility]  [phone]* (retail)       [address]       [place], [place]  [address]       Ph: [phone] or [phone]       Fax: [phone]   RxID:   [id number]  ]

## 2010-11-29 NOTE — Assessment & Plan Note (Signed)
Summary: flu shot/njr   Nurse Visit   Review of Systems       Flu Vaccine Consent Questions     Do you have a history of severe allergic reactions to this vaccine? no    Any prior history of allergic reactions to egg and/or gelatin? no    Do you have a sensitivity to the preservative Thimersol? no    Do you have a past history of Guillan-Barre Syndrome? no    Do you currently have an acute febrile illness? no    Have you ever had a severe reaction to latex? no    Vaccine information given and explained to patient? yes    Are you currently pregnant? no    Lot Number:[id number]   Exp Date:04/29/2011   Site Given  Left Deltoid IM Pura Spice, RN  August 19, 2010 3:10 PM    Allergies: No Known Drug Allergies  Orders Added: 1)  Admin 1st Vaccine [90471] 2)  Flu Vaccine 62yrs + [34742]

## 2010-11-29 NOTE — Assessment & Plan Note (Signed)
Summary: cpx/cjr/pt rsc/cjr   Vital Signs:  Patient profile:   60 year old female Height:      67 inches Weight:      186 pounds BMI:     29.24  Vitals Entered By: Raechel Ache, RN (January 26, 2010 9:13 AM) CC: CPX, labs done. C/o arthritis pain. Sees gyn.   History of Present Illness: 60 yr old female for cpx. She is doing well in general except for ongoing arthitis pains. When we spoke about her high cholseterol, she admits to eating very poorly for the past feew months and not getting much exercise. She had spent a lot of time with her ill father, who recently died, and she ended up eating a lot of fast food.   Allergies: No Known Drug Allergies  Past History:  Past Medical History: PMS (sees Dr. 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 Lonni Fix 01-2006 Foot surgery per Dr. Chaney Malling 1987 nasal septum  surgery per Dr. Berna Bue 1992 lithotripsy 2007 L5 lumbar disc surgery per Dr. Roxan Hockey 1993 colonoscopy 12-18-06 per Dr. Marina Goodell, repeat in 5 yrs Caesarean section Tubal ligation 1989 kidney stone surgery per Dr. Lorretta Harp 2005 Total hip replacement, right per Dr. Lequita Halt in 11-2008 ESI to cervical spine per Dr. Alvester Morin 09-2009  Review of Systems  The patient denies anorexia, fever, weight loss, weight gain, vision loss, decreased hearing, hoarseness, chest pain, syncope, dyspnea on exertion, peripheral edema, prolonged cough, headaches, hemoptysis, abdominal pain, melena, hematochezia, severe indigestion/heartburn, hematuria, incontinence, genital sores, muscle weakness, suspicious skin lesions, transient blindness, difficulty walking, depression, unusual weight change, abnormal bleeding, enlarged lymph  nodes, angioedema, breast masses, and testicular masses.    Physical Exam  General:  overweight-appearing.   Head:  Normocephalic and atraumatic without obvious abnormalities. No apparent alopecia or balding. Eyes:  No corneal or conjunctival inflammation noted. EOMI. Perrla. Funduscopic exam benign, without hemorrhages, exudates or papilledema. Vision grossly normal. Ears:  External ear exam shows no significant lesions or deformities.  Otoscopic examination reveals clear canals, tympanic membranes are intact bilaterally without bulging, retraction, inflammation or discharge. Hearing is grossly normal bilaterally. Nose:  External nasal examination shows no deformity or inflammation. Nasal mucosa are pink and moist without lesions or exudates. Mouth:  Oral mucosa and oropharynx without lesions or exudates.  Teeth in good repair. Neck:  No deformities, masses, or tenderness noted. Chest Wall:  No deformities, masses, or tenderness noted. Lungs:  Normal respiratory effort, chest expands symmetrically. Lungs are clear to auscultation, no crackles or wheezes. Heart:  Normal rate and regular rhythm. S1 and S2 normal without gallop, murmur, click, rub or other extra sounds. EKG normal Abdomen:  Bowel sounds positive,abdomen soft and non-tender without masses, organomegaly or hernias noted. Msk:  No deformity or scoliosis noted of thoracic or lumbar spine.   Pulses:  R and L carotid,radial,femoral,dorsalis pedis and posterior tibial pulses are full and equal bilaterally Extremities:  No clubbing, cyanosis, edema, or deformity noted with normal full range of motion of all joints.   Neurologic:  No cranial nerve deficits noted. Station and gait are normal. Plantar reflexes are down-going bilaterally. DTRs are symmetrical throughout. Sensory, motor and coordinative functions appear intact. Skin:  Intact without suspicious lesions or rashes Cervical Nodes:  No lymphadenopathy noted Axillary  Nodes:  No  palpable lymphadenopathy Inguinal Nodes:  No significant adenopathy Psych:  Cognition and judgment appear intact. Alert and cooperative with normal attention span and concentration. No apparent delusions, illusions, hallucinations   Impression & Recommendations:  Problem # 1:  WELL ADULT EXAM (ICD-V70.0)  Orders: EKG w/ Interpretation (93000)  Complete Medication List: 1)  Nitrofurantoin Monohyd Macro 100 Mg Caps (Nitrofurantoin monohyd macro) .... Two times a day as needed uti 2)  Rosula 10-5 % Gel (Sulfacetamide-sulfur in urea) .... Once daily 3)  Triaz Cleanser 3 % Lotn (Benzoyl peroxide) .... Once daily 4)  Betamethasone Valerate 0.1 % Lotn (Betamethasone valerate) .... Two times a day as needed 5)  Fioricet 50-325-40 Mg Tabs (Butalbital-apap-caffeine) .Marland Kitchen.. 1 every 4 hours as needed ha 6)  Benzac Ac 5 % Gel (Benzoyl peroxide) .... Once daily 7)  Corgard 20 Mg Tabs (Nadolol) .... Once daily 8)  Prednisone 10 Mg Tabs (Prednisone) .... As needed for mouth ulcers 9)  Fish Oil Oil (Fish oil) .... Once daily 10)  Calcium Carbonate-vitamin D 600-400 Mg-unit Tabs (Calcium carbonate-vitamin d) .... Once daily 11)  Vitamin C 500 Mg Tabs (Ascorbic acid) .... Once daily 12)  Vitamin B-12 500 Mcg Tabs (Cyanocobalamin) .... Once daily 13)  Flax Oil (Flaxseed (linseed)) .... Once daily 14)  Omeprazole 20 Mg Cpdr (Omeprazole) .... One by mouth daily 15)  Aspirin 81 Mg Tbec (Aspirin) .... One by mouth every day 16)  Loratadine 10 Mg Tabs (Loratadine) .... Once daily as needed allergies 17)  Provera 2.5 Mg Tabs (Medroxyprogesterone acetate) .Marland Kitchen.. 1 once daily 18)  Ogen .625  .Marland Kitchen.. 1 once daily  Patient Instructions: 1)  It is important that you exercise reguarly at least 20 minutes 5 times a week. If you develop chest pain, have severe difficulty breathing, or feel very tired, stop exercising immediately and seek medical attention.  2)  You need to lose weight. Consider a lower calorie diet and  regular exercise.  3)  I suggested she take a statin for her cholesterol, but she is very opposed to this idea. She will get back on a strict diet, and we will check her lipids again in 6 months.  Prescriptions: CORGARD 20 MG TABS (NADOLOL) once daily  #30 x 11   Entered and Authorized by:   Nelwyn Salisbury MD   Signed by:   Nelwyn Salisbury MD on 01/26/2010   Method used:   Electronically to        [facility]  [phone]* (retail)       [address]       [place], [place]  [address]       Ph: [phone] or [phone]       Fax: [phone]   RxID:   [id number] BETAMETHASONE VALERATE 0.1 %  LOTN (BETAMETHASONE VALERATE) two times a day as needed  #60 x 5   Entered and Authorized by:   Nelwyn Salisbury MD   Signed by:   Nelwyn Salisbury MD on 01/26/2010   Method used:   Electronically to        [facility]  [phone]* (retail)       [address]       [place], [place]  [address]       Ph: [phone] or [phone]       Fax: [phone]   RxID:  [id number] TRIAZ CLEANSER 3 % LOTN (BENZOYL PEROXIDE) once daily Brand medically necessary #30 x 11   Entered and Authorized by:   Nelwyn Salisbury MD   Signed by:   Nelwyn Salisbury MD on 01/26/2010   Method used:   Electronically to        [facility]  [phone]* (retail)       [address]       [place], [place]  [address]       Ph: [phone] or [phone]       Fax: [phone]   RxID:   [id number] ROSULA 10-5 % GEL (SULFACETAMIDE-SULFUR IN UREA) once daily  #45 x 5   Entered and Authorized by:   Nelwyn Salisbury MD   Signed by:   Nelwyn Salisbury MD on 01/26/2010   Method used:   Electronically to        [facility]  [phone]* (retail)       [address]       [place], [place]  [address]       Ph: [phone] or [phone]       Fax: [phone]   RxID:   [id number] FIORICET  50-325-40 MG  TABS (BUTALBITAL-APAP-CAFFEINE) 1 every 4 hours as needed HA Brand medically necessary #60 x 3   Entered and Authorized by:   Nelwyn Salisbury MD   Signed by:   Nelwyn Salisbury MD on 01/26/2010   Method used:   Print then Give to Patient   RxID:   [id number]    Preventive Care Screening  Colonoscopy:    Date:  11/30/2006    Results:  normal

## 2010-11-29 NOTE — Letter (Signed)
Summary: medical data  medical data   Imported By: Kassie Mends 11/22/2007 12:20:24  _____________________________________________________________________  External Attachment:    Type:   Image     Comment:   medical data

## 2010-11-29 NOTE — Progress Notes (Signed)
Summary: lab results-LMTCB [facility] [place]  Phone Note Call from Patient Call back at Work Phone [phone]   Caller: Patient Call For: Dr. Tawanna Cooler Reason for Call: Lab or Test Results Summary of Call: need the results on her bone density test done on  9/22 pls call for results  Initial call taken by: Shan Levans,  July 31, 2007 11:30 AM  Follow-up for Phone Call        TEST DONE  [place]  [facility] ,  PT CALED ,WILL CALL BACK AS SOON AS RESULTS ARE IN  CALLED [facility] #[phone] - NO ANSWER LMTCB   Follow-up by: Arcola Jansky, RN,  August 02, 2007 8:14 AM  Additional Follow-up for Phone Call Additional follow up Details #1::        Phone Call Completed,called to have BMD results faxed over today,because we have not recieved a copy yet.  call patient BMD normal Additional Follow-up by: Arcola Jansky, RN,  August 05, 2007 8:43 AM         Appended Document: lab results-LMTCB BMD [place] Fax recieved and pt was called by DR Tawanna Cooler.

## 2010-11-29 NOTE — Miscellaneous (Signed)
Summary: BONE DENSITY  Clinical Lists Changes  Orders: Added new Test order of T-Bone Densitometry (77080) - Signed Added new Test order of T-Lumbar Vertebral Assessment (77082) - Signed 

## 2010-11-29 NOTE — Assessment & Plan Note (Signed)
Summary: cpx/cdw   Vital Signs:  Patient Profile:   60 Years Old Female Height:     67 inches Weight:      184 pounds Temp:     98.2 degrees F oral Pulse rate:   77 / minute BP sitting:   132 / 92  (left arm) Cuff size:   regular  Vitals Entered By: Alfred Levins, CMA (November 18, 2008 2:49 PM)                 Chief Complaint:  cpx and no pap.  History of Present Illness: 60 yr old female for cpx. She is due to have a right total hip replacement per Dr. Lequita Halt on 12-16-08, and needs surgical clearance as well. Other than her hip pain, she feels great and has no concerns. She was recently seen for diarrhea, but this cleared up nicely after a course of Flagyl.     Current Allergies (reviewed today): No known allergies   Past Medical History:    Reviewed history from 11/18/2007 and no changes required:       PMS (sees Dr. Audie Box)       Acne (sees Dr. Lonni Fix)       migraines       Allergic rhinitis       GERD       frequent UTI's (sees Dr. Wanda Plump)       normal cardiac stress test 11-05  Past Surgical History:    Reviewed history from 11/18/2007 and no changes required:       Melanoma L upper Arm, per Dr. Lonni Fix       Foot surgery       nasal septum  surgery       lithotripsy       lumbar disc surgery X 2       colonoscopy 12-18-06 per Dr. Marina Goodell, repeat in 5 yrs       Caesarean section   Family History:    Reviewed history from 06/19/2007 and no changes required:       Family History of CAD Female 1st degree relative <60       Family History Hypertension       Family History Psychiatric care       Family History of Cardiovascular disorder  Social History:    Reviewed history from 06/19/2007 and no changes required:       Occupation: Merchandiser, retail       Married       Current Smoker       Alcohol use-no    Review of Systems  The patient denies anorexia, fever, weight loss, weight gain, vision loss, decreased hearing, hoarseness, chest pain, syncope,  dyspnea on exertion, peripheral edema, prolonged cough, headaches, hemoptysis, abdominal pain, melena, hematochezia, severe indigestion/heartburn, hematuria, incontinence, genital sores, muscle weakness, suspicious skin lesions, transient blindness, difficulty walking, depression, unusual weight change, abnormal bleeding, enlarged lymph nodes, angioedema, breast masses, and testicular masses.     Physical Exam  General:     overweight-appearing.   Head:     Normocephalic and atraumatic without obvious abnormalities. No apparent alopecia or balding. Eyes:     No corneal or conjunctival inflammation noted. EOMI. Perrla. Funduscopic exam benign, without hemorrhages, exudates or papilledema. Vision grossly normal. Ears:     External ear exam shows no significant lesions or deformities.  Otoscopic examination reveals clear canals, tympanic membranes are intact bilaterally without bulging, retraction, inflammation or discharge. Hearing is grossly normal bilaterally. Nose:  External nasal examination shows no deformity or inflammation. Nasal mucosa are pink and moist without lesions or exudates. Mouth:     Oral mucosa and oropharynx without lesions or exudates.  Teeth in good repair. Neck:     No deformities, masses, or tenderness noted. Chest Wall:     No deformities, masses, or tenderness noted. Lungs:     Normal respiratory effort, chest expands symmetrically. Lungs are clear to auscultation, no crackles or wheezes. Heart:     Normal rate and regular rhythm. S1 and S2 normal without gallop, murmur, click, rub or other extra sounds. EKG normal Abdomen:     Bowel sounds positive,abdomen soft and non-tender without masses, organomegaly or hernias noted. Msk:     No deformity or scoliosis noted of thoracic or lumbar spine.   Pulses:     R and L carotid,radial,femoral,dorsalis pedis and posterior tibial pulses are full and equal bilaterally Extremities:     No clubbing, cyanosis, edema, or  deformity noted with normal full range of motion of all joints.   Neurologic:     No cranial nerve deficits noted. Station and gait are normal. Plantar reflexes are down-going bilaterally. DTRs are symmetrical throughout. Sensory, motor and coordinative functions appear intact. Skin:     Intact without suspicious lesions or rashes Cervical Nodes:     No lymphadenopathy noted Axillary Nodes:     No palpable lymphadenopathy Inguinal Nodes:     No significant adenopathy Psych:     Cognition and judgment appear intact. Alert and cooperative with normal attention span and concentration. No apparent delusions, illusions, hallucinations    Impression & Recommendations:  Problem # 1:  WELL ADULT EXAM (ICD-V70.0)  Orders: EKG w/ Interpretation (93000)   Complete Medication List: 1)  Nitrofurantoin Monohyd Macro 100 Mg Caps (Nitrofurantoin monohyd macro) .... Two times a day as needed uti 2)  Rosula 10-5 % Gel (Sulfacetamide-sulfur in urea) .... Once daily 3)  Triaz Cleanser 3 % Lotn (Benzoyl peroxide) .... Once daily 4)  Betamethasone Valerate 0.1 % Lotn (Betamethasone valerate) .... Two times a day as needed 5)  Fioricet 50-325-40 Mg Tabs (Butalbital-apap-caffeine) .Marland Kitchen.. 1 every 4 hours as needed ha 6)  Benzac Ac 5 % Gel (Benzoyl peroxide) .... Once daily 7)  Corgard 20 Mg Tabs (Nadolol) .... Once daily 8)  Prednisone 10 Mg Tabs (Prednisone) .... As needed for mouth ulcers 9)  Tramadol Hcl 50 Mg Tabs (Tramadol hcl) .... As needed 10)  Fish Oil Oil (Fish oil) .... Once daily 11)  Calcium Carbonate-vitamin D 600-400 Mg-unit Tabs (Calcium carbonate-vitamin d) .... Once daily 12)  Vitamin C 500 Mg Tabs (Ascorbic acid) .... Once daily 13)  Vitamin B-12 500 Mcg Tabs (Cyanocobalamin) .... Once daily 14)  Flax Oil (Flaxseed (linseed)) .... Once daily 15)  Omeprazole 20 Mg Cpdr (Omeprazole) .... One by mouth daily 16)  Aspirin 81 Mg Tbec (Aspirin) .... One by mouth every day 17)  Loratadine 10  Mg Tabs (Loratadine) .... Once daily as needed allergies   Patient Instructions: 1)  It is important that you exercise regularly at least 20 minutes 5 times a week. If you develop chest pain, have severe difficulty breathing, or feel very tired , stop exercising immediately and seek medical attention. 2)  You need to lose weight. Consider a lower calorie diet and regular exercise.  3)  Recheck lipids in 6 months. 4)  She is cleared for surgery.    Prescriptions: BETAMETHASONE VALERATE 0.1 %  LOTN (BETAMETHASONE VALERATE)  two times a day as needed  #90 days x 3   Entered and Authorized by:   Nelwyn Salisbury MD   Signed by:   Nelwyn Salisbury MD on 11/18/2008   Method used:   Print then Give to Patient   RxID:   [id number] ROSULA 10-5 % GEL (SULFACETAMIDE-SULFUR IN UREA) once daily  #90 days x 3   Entered and Authorized by:   Nelwyn Salisbury MD   Signed by:   Nelwyn Salisbury MD on 11/18/2008   Method used:   Print then Give to Patient   RxID:   [id number] TRIAZ CLEANSER 3 % LOTN (BENZOYL PEROXIDE) once daily  #90 days x 3   Entered and Authorized by:   Nelwyn Salisbury MD   Signed by:   Nelwyn Salisbury MD on 11/18/2008   Method used:   Print then Give to Patient   RxID:   [id number] FIORICET 50-325-40 MG  TABS (BUTALBITAL-APAP-CAFFEINE) 1 every 4 hours as needed HA Brand medically necessary #60 x 3   Entered and Authorized by:   Nelwyn Salisbury MD   Signed by:   Nelwyn Salisbury MD on 11/18/2008   Method used:   Print then Give to Patient   RxID:   [id number] CORGARD 20 MG TABS (NADOLOL) once daily  #90 x 3   Entered and Authorized by:   Nelwyn Salisbury MD   Signed by:   Nelwyn Salisbury MD on 11/18/2008   Method used:   Print then Give to Patient   RxID:   [id number]

## 2010-11-30 ENCOUNTER — Ambulatory Visit: Admit: 2010-11-30 | Payer: Self-pay | Admitting: Gynecology

## 2010-11-30 ENCOUNTER — Other Ambulatory Visit (HOSPITAL_COMMUNITY)
Admission: RE | Admit: 2010-11-30 | Discharge: 2010-11-30 | Disposition: A | Discharge status: Home / Self Care | Payer: 59 | Source: Ambulatory Visit | Attending: Gynecology | Admitting: Gynecology

## 2010-11-30 ENCOUNTER — Other Ambulatory Visit: Payer: Self-pay | Admitting: Gynecology

## 2010-11-30 ENCOUNTER — Encounter: Payer: 59 | Admitting: Gynecology

## 2010-11-30 DIAGNOSIS — Z124 Encounter for screening for malignant neoplasm of cervix: Secondary | ICD-10-CM | POA: Insufficient documentation

## 2010-11-30 DIAGNOSIS — N390 Urinary tract infection, site not specified: Secondary | ICD-10-CM

## 2010-11-30 DIAGNOSIS — R82998 Other abnormal findings in urine: Secondary | ICD-10-CM

## 2010-11-30 DIAGNOSIS — Z01419 Encounter for gynecological examination (general) (routine) without abnormal findings: Secondary | ICD-10-CM

## 2010-12-27 ENCOUNTER — Other Ambulatory Visit (INDEPENDENT_AMBULATORY_CARE_PROVIDER_SITE_OTHER): Payer: 59

## 2010-12-27 DIAGNOSIS — R82998 Other abnormal findings in urine: Secondary | ICD-10-CM

## 2010-12-27 DIAGNOSIS — N390 Urinary tract infection, site not specified: Secondary | ICD-10-CM

## 2011-01-24 ENCOUNTER — Telehealth: Payer: Self-pay | Admitting: Family Medicine

## 2011-01-24 NOTE — Telephone Encounter (Signed)
Please send her a new rx of Rosula sulfa cream to [facility]. Was on a different one that Dr Clent Ridges had rx'd before. She does not want that.

## 2011-01-25 MED ORDER — SULFACETAMIDE-SULFUR IN UREA 10-5 % EX GEL: 1.0000 "application " | Freq: Every day | CUTANEOUS | Status: DC

## 2011-01-25 NOTE — Telephone Encounter (Signed)
Rosula 1--5 % gel, and prefers generic. [facility]  States Dr. Clent Ridges prescribed it last March?

## 2011-01-25 NOTE — Telephone Encounter (Signed)
Please call in Rosula 10-5% gel, apply daily, for one year

## 2011-01-25 NOTE — Telephone Encounter (Signed)
She needs to tell me exactly what she wants. They make Rosula foam 10-4% or gel 10-5%. I have never prescribed this before so I don't know what she has in mind

## 2011-02-14 LAB — URINALYSIS, ROUTINE W REFLEX MICROSCOPIC
Bilirubin Urine: NEGATIVE
Glucose, UA: NEGATIVE mg/dL
Hgb urine dipstick: NEGATIVE
Ketones, ur: NEGATIVE mg/dL
Nitrite: NEGATIVE
Protein, ur: NEGATIVE mg/dL
Specific Gravity, Urine: 1.023 (ref 1.005–1.030)
Urobilinogen, UA: 0.2 mg/dL (ref 0.0–1.0)
pH: 6 (ref 5.0–8.0)

## 2011-02-14 LAB — BASIC METABOLIC PANEL
BUN: 10 mg/dL (ref 6–23)
BUN: 12 mg/dL (ref 6–23)
BUN: 9 mg/dL (ref 6–23)
CO2: 27 mEq/L (ref 19–32)
CO2: 29 mEq/L (ref 19–32)
CO2: 29 mEq/L (ref 19–32)
Calcium: 8.1 mg/dL — ABNORMAL LOW (ref 8.4–10.5)
Calcium: 8.1 mg/dL — ABNORMAL LOW (ref 8.4–10.5)
Calcium: 8.4 mg/dL (ref 8.4–10.5)
Chloride: 100 mEq/L (ref 96–112)
Chloride: 103 mEq/L (ref 96–112)
Chloride: 99 mEq/L (ref 96–112)
Creatinine, Ser: 0.62 mg/dL (ref 0.4–1.2)
Creatinine, Ser: 0.66 mg/dL (ref 0.4–1.2)
Creatinine, Ser: 0.68 mg/dL (ref 0.4–1.2)
GFR calc Af Amer: >60 mL/min (ref >60)
GFR calc Af Amer: >60 mL/min (ref >60)
GFR calc Af Amer: >60 mL/min (ref >60)
GFR calc non Af Amer: >60 mL/min (ref >60)
GFR calc non Af Amer: >60 mL/min (ref >60)
GFR calc non Af Amer: >60 mL/min (ref >60)
Glucose, Bld: 111 mg/dL — ABNORMAL HIGH (ref 70–99)
Glucose, Bld: 120 mg/dL — ABNORMAL HIGH (ref 70–99)
Glucose, Bld: 125 mg/dL — ABNORMAL HIGH (ref 70–99)
Potassium: 3.5 mEq/L (ref 3.5–5.1)
Potassium: 3.8 mEq/L (ref 3.5–5.1)
Potassium: 3.9 mEq/L (ref 3.5–5.1)
Sodium: 134 mEq/L — ABNORMAL LOW (ref 135–145)
Sodium: 134 mEq/L — ABNORMAL LOW (ref 135–145)
Sodium: 135 mEq/L (ref 135–145)

## 2011-02-14 LAB — COMPREHENSIVE METABOLIC PANEL
ALT: 19 U/L (ref 0–35)
AST: 21 U/L (ref 0–37)
Albumin: 3.7 g/dL (ref 3.5–5.2)
Alkaline Phosphatase: 69 U/L (ref 39–117)
BUN: 15 mg/dL (ref 6–23)
CO2: 27 mEq/L (ref 19–32)
Calcium: 9.2 mg/dL (ref 8.4–10.5)
Chloride: 107 mEq/L (ref 96–112)
Creatinine, Ser: 0.74 mg/dL (ref 0.4–1.2)
GFR calc Af Amer: >60 mL/min (ref >60)
GFR calc non Af Amer: >60 mL/min (ref >60)
Glucose, Bld: 131 mg/dL — ABNORMAL HIGH (ref 70–99)
Potassium: 4.1 mEq/L (ref 3.5–5.1)
Sodium: 142 mEq/L (ref 135–145)
Total Bilirubin: 0.5 mg/dL (ref 0.3–1.2)
Total Protein: 6.5 g/dL (ref 6.0–8.3)

## 2011-02-14 LAB — TYPE AND SCREEN
ABO/RH(D): O POS
Antibody Screen: NEGATIVE

## 2011-02-14 LAB — CBC
HCT: 40.9 % (ref 36.0–46.0)
HCT: 31.5 % — ABNORMAL LOW (ref 36.0–46.0)
HCT: 32.4 % — ABNORMAL LOW (ref 36.0–46.0)
HCT: 33.8 % — ABNORMAL LOW (ref 36.0–46.0)
Hemoglobin: 13.9 g/dL (ref 12.0–15.0)
Hemoglobin: 10.7 g/dL — ABNORMAL LOW (ref 12.0–15.0)
Hemoglobin: 11 g/dL — ABNORMAL LOW (ref 12.0–15.0)
Hemoglobin: 11.4 g/dL — ABNORMAL LOW (ref 12.0–15.0)
MCHC: 33.9 g/dL (ref 30.0–36.0)
MCHC: 33.7 g/dL (ref 30.0–36.0)
MCHC: 33.9 g/dL (ref 30.0–36.0)
MCHC: 34.1 g/dL (ref 30.0–36.0)
MCV: 90.9 fL (ref 78.0–100.0)
MCV: 89.9 fL (ref 78.0–100.0)
MCV: 89.9 fL (ref 78.0–100.0)
MCV: 90.9 fL (ref 78.0–100.0)
Platelets: 219 10*3/uL (ref 150–400)
Platelets: 164 10*3/uL (ref 150–400)
Platelets: 164 10*3/uL (ref 150–400)
Platelets: 177 10*3/uL (ref 150–400)
RBC: 4.5 MIL/uL (ref 3.87–5.11)
RBC: 3.5 MIL/uL — ABNORMAL LOW (ref 3.87–5.11)
RBC: 3.61 MIL/uL — ABNORMAL LOW (ref 3.87–5.11)
RBC: 3.72 MIL/uL — ABNORMAL LOW (ref 3.87–5.11)
RDW: 13 % (ref 11.5–15.5)
RDW: 12.4 % (ref 11.5–15.5)
RDW: 12.8 % (ref 11.5–15.5)
RDW: 13.2 % (ref 11.5–15.5)
WBC: 5.3 10*3/uL (ref 4.0–10.5)
WBC: 8.4 10*3/uL (ref 4.0–10.5)
WBC: 9.1 10*3/uL (ref 4.0–10.5)
WBC: 9.7 10*3/uL (ref 4.0–10.5)

## 2011-02-14 LAB — PROTIME-INR
INR: 1 (ref 0.00–1.49)
INR: 1.1 (ref 0.00–1.49)
INR: 1.8 — ABNORMAL HIGH (ref 0.00–1.49)
INR: 2.1 — ABNORMAL HIGH (ref 0.00–1.49)
INR: 2.5 — ABNORMAL HIGH (ref 0.00–1.49)
Prothrombin Time: 13.5 seconds (ref 11.6–15.2)
Prothrombin Time: 14.3 seconds (ref 11.6–15.2)
Prothrombin Time: 22 seconds — ABNORMAL HIGH (ref 11.6–15.2)
Prothrombin Time: 25.1 seconds — ABNORMAL HIGH (ref 11.6–15.2)
Prothrombin Time: 29 seconds — ABNORMAL HIGH (ref 11.6–15.2)

## 2011-02-14 LAB — URINE MICROSCOPIC-ADD ON

## 2011-02-14 LAB — ABO/RH: ABO/RH(D): O POS

## 2011-02-14 LAB — APTT: aPTT: 39 seconds — ABNORMAL HIGH (ref 24–37)

## 2011-03-01 ENCOUNTER — Other Ambulatory Visit (INDEPENDENT_AMBULATORY_CARE_PROVIDER_SITE_OTHER): Payer: 59 | Admitting: Family Medicine

## 2011-03-01 DIAGNOSIS — Z0000 Encounter for general adult medical examination without abnormal findings: Secondary | ICD-10-CM

## 2011-03-01 LAB — HEPATIC FUNCTION PANEL
ALT: 25 U/L (ref 0–35)
AST: 28 U/L (ref 0–37)
Albumin: 3.9 g/dL (ref 3.5–5.2)
Alkaline Phosphatase: 56 U/L (ref 39–117)
Bilirubin, Direct: 0 mg/dL (ref 0.0–0.3)
Total Bilirubin: 0.5 mg/dL (ref 0.3–1.2)
Total Protein: 6.5 g/dL (ref 6.0–8.3)

## 2011-03-01 LAB — CBC WITH DIFFERENTIAL/PLATELET
Basophils Absolute: 0 10*3/uL (ref 0.0–0.1)
Basophils Relative: 0.8 % (ref 0.0–3.0)
Eosinophils Absolute: 0.1 10*3/uL (ref 0.0–0.7)
Eosinophils Relative: 1.6 % (ref 0.0–5.0)
HCT: 41.8 % (ref 36.0–46.0)
Hemoglobin: 14.2 g/dL (ref 12.0–15.0)
Lymphocytes Relative: 29.9 % (ref 12.0–46.0)
Lymphs Abs: 1.4 10*3/uL (ref 0.7–4.0)
MCHC: 33.9 g/dL (ref 30.0–36.0)
MCV: 93.2 fl (ref 78.0–100.0)
Monocytes Absolute: 0.6 10*3/uL (ref 0.1–1.0)
Monocytes Relative: 12.8 % — ABNORMAL HIGH (ref 3.0–12.0)
Neutro Abs: 2.6 10*3/uL (ref 1.4–7.7)
Neutrophils Relative %: 54.9 % (ref 43.0–77.0)
Platelets: 195 10*3/uL (ref 150.0–400.0)
RBC: 4.48 Mil/uL (ref 3.87–5.11)
RDW: 13.1 % (ref 11.5–14.6)
WBC: 4.7 10*3/uL (ref 4.5–10.5)

## 2011-03-01 LAB — POCT URINALYSIS DIPSTICK
Bilirubin, UA: NEGATIVE
Blood, UA: NEGATIVE
Glucose, UA: NEGATIVE
Ketones, UA: NEGATIVE
Nitrite, UA: NEGATIVE
Protein, UA: NEGATIVE
Spec Grav, UA: 1.02
Urobilinogen, UA: 0.2
pH, UA: 7

## 2011-03-01 LAB — BASIC METABOLIC PANEL
BUN: 17 mg/dL (ref 6–23)
CO2: 28 mEq/L (ref 19–32)
Calcium: 9.1 mg/dL (ref 8.4–10.5)
Chloride: 107 mEq/L (ref 96–112)
Creatinine, Ser: 0.6 mg/dL (ref 0.4–1.2)
GFR: 113.16 mL/min (ref >60.00)
Glucose, Bld: 89 mg/dL (ref 70–99)
Potassium: 4.8 mEq/L (ref 3.5–5.1)
Sodium: 142 mEq/L (ref 135–145)

## 2011-03-01 LAB — LIPID PANEL
Cholesterol: 234 mg/dL — ABNORMAL HIGH (ref 0–200)
HDL: 41.6 mg/dL (ref >39.00)
Total CHOL/HDL Ratio: 6
Triglycerides: 124 mg/dL (ref 0.0–149.0)
VLDL: 24.8 mg/dL (ref 0.0–40.0)

## 2011-03-01 LAB — LDL CHOLESTEROL, DIRECT: Direct LDL: 183.7 mg/dL

## 2011-03-01 LAB — TSH: TSH: 2.58 u[IU]/mL (ref 0.35–5.50)

## 2011-03-02 NOTE — Progress Notes (Signed)
Pt Informed via husband. States will not take Rx called in/Dr Clent Ridges informed.

## 2011-03-13 ENCOUNTER — Ambulatory Visit (INDEPENDENT_AMBULATORY_CARE_PROVIDER_SITE_OTHER): Payer: 59 | Admitting: Family Medicine

## 2011-03-13 ENCOUNTER — Encounter: Payer: Self-pay | Admitting: Family Medicine

## 2011-03-13 VITALS — BP 128/82 | HR 78 | Temp 99.2°F | Resp 14 | Ht 67.5 in | Wt 185.6 lb

## 2011-03-13 DIAGNOSIS — Z0000 Encounter for general adult medical examination without abnormal findings: Secondary | ICD-10-CM

## 2011-03-13 DIAGNOSIS — Z136 Encounter for screening for cardiovascular disorders: Secondary | ICD-10-CM

## 2011-03-13 MED ORDER — SIMVASTATIN 40 MG PO TABS: 40.0000 mg | ORAL_TABLET | Freq: Every day | ORAL | Status: DC

## 2011-03-13 MED ORDER — PREDNISONE 10 MG PO TABS: 10.0000 mg | ORAL_TABLET | ORAL | Status: DC | PRN

## 2011-03-13 MED ORDER — NADOLOL 20 MG PO TABS: 20.0000 mg | ORAL_TABLET | Freq: Every day | ORAL | Status: DC

## 2011-03-13 MED ORDER — FIORICET 50-325-40 MG PO TABS: 1.0000 | ORAL_TABLET | ORAL | Status: DC | PRN

## 2011-03-13 NOTE — Progress Notes (Signed)
  Subjective:    Patient ID: Victoria Mosley, female    DOB: 1951-07-28, 60 y.o.   MRN: [id number]  HPI 60 yr old female for a cpx. She feels well in general and has no complaints today. When I asked about any recent chest pains, she did mention some brief chest pain she felt during church services yesterday. No SOB or nausea or sweats. She attributed this to heartburn because it promptly resolved with some TUMS. No discomfort during exertion. She has had several negative stress tests, the most recent being in 2008.    Review of Systems  Constitutional: Negative.   HENT: Negative.   Eyes: Negative.   Respiratory: Negative.   Cardiovascular: Negative.   Gastrointestinal: Negative.   Genitourinary: Negative for dysuria, urgency, frequency, hematuria, flank pain, decreased urine volume, enuresis, difficulty urinating, pelvic pain and dyspareunia.  Musculoskeletal: Negative.   Skin: Negative.   Neurological: Negative.   Hematological: Negative.   Psychiatric/Behavioral: Negative.        Objective:   Physical Exam  Constitutional: She is oriented to person, place, and time. She appears well-developed and well-nourished. No distress.  HENT:  Head: Normocephalic and atraumatic.  Right Ear: External ear normal.  Left Ear: External ear normal.  Nose: Nose normal.  Mouth/Throat: Oropharynx is clear and moist. No oropharyngeal exudate.  Eyes: Conjunctivae and EOM are normal. Pupils are equal, round, and reactive to light. No scleral icterus.  Neck: Normal range of motion. Neck supple. No JVD present. No thyromegaly present.  Cardiovascular: Normal rate, regular rhythm, normal heart sounds and intact distal pulses.  Exam reveals no gallop and no friction rub.   No murmur heard.      EKG shows some new lateral T wave inversions   Pulmonary/Chest: Effort normal and breath sounds normal. No respiratory distress. She has no wheezes. She has no rales. She exhibits no tenderness.  Abdominal:  Soft. Bowel sounds are normal. She exhibits no distension and no mass. There is no tenderness. There is no rebound and no guarding.  Musculoskeletal: Normal range of motion. She exhibits no edema and no tenderness.  Lymphadenopathy:    She has no cervical adenopathy.  Neurological: She is alert and oriented to person, place, and time. She has normal reflexes. No cranial nerve deficit. She exhibits normal muscle tone. Coordination normal.  Skin: Skin is warm and dry. No rash noted. No erythema.  Psychiatric: She has a normal mood and affect. Her behavior is normal. Judgment and thought content normal.          Assessment & Plan:  With the new EKG findings we agreed that it would be a good idea to set up another stress test soon. She reluctantly agreed to start on Simvastatin to get her LDL down. Continue daily aspirin.

## 2011-03-14 ENCOUNTER — Telehealth (HOSPITAL_COMMUNITY): Payer: Self-pay | Admitting: Radiology

## 2011-03-14 ENCOUNTER — Other Ambulatory Visit: Payer: Self-pay | Admitting: *Deleted

## 2011-03-14 DIAGNOSIS — Z136 Encounter for screening for cardiovascular disorders: Secondary | ICD-10-CM

## 2011-03-14 NOTE — Op Note (Signed)
[name], [name]             ACCOUNT NO.:  [id number]   MEDICAL RECORD NO.:  [id number]          PATIENT TYPE:  INP   LOCATION:  NA                           FACILITY:  [facility]   PHYSICIAN:  Ollen Gross, M.D.    DATE OF BIRTH:  [birth year]   DATE OF PROCEDURE:  12/16/2008  DATE OF DISCHARGE:                               OPERATIVE REPORT   PREOPERATIVE DIAGNOSES:  Osteoarthritis, right hip.   POSTOPERATIVE DIAGNOSES:  Osteoarthritis, right hip.   PROCEDURE:  Right total hip arthroplasty.   SURGEON:  Ollen Gross, M.D.   ASSISTANT:  Avel Peace PA-C   ANESTHESIA:  General.   ESTIMATED BLOOD LOSS:  200.   DRAIN:  Hemovac times one.   COMPLICATIONS:  None.   CONDITION:  Stable to recovery.   CLINICAL NOTE:  Victoria Mosley is a 60 year old female with rapidly progressive  osteoarthritis of the right hip with significant pain and dysfunction.  She has had a marked progression of pain symptoms and arthritis over a 6-  month time span.  She now has end-stage arthritic hip.  She presents now  for total hip arthroplasty.   PROCEDURE IN DETAIL:  After successful administration of general  anesthetic, the patient was placed in the left lateral decubitus  position with her right side up and held with the hip positioner.  The  right lower extremity was isolated from her perineum with plastic drapes  and prepped and draped in the usual sterile fashion.  Short  posterolateral incision is made with a 10 blade through subcutaneous  tissue to the level of the fascia lata which is incised in line with the  skin incision.  The sciatic nerve is palpated and protected and the  short external rotators isolated off the femur.  Capsulectomy is  performed and the hip is dislocated.  The center of the femoral head is  marked and a trial prosthesis is placed, such that the center of the  trial head corresponds to the center of her native femoral head.  Osteotomy line is marked on the femoral neck and  osteotomy made with an  oscillating saw.  Femoral head is then removed and the femur retracted  anteriorly to gain acetabular exposure.   Acetabular retractors are placed and labrum and osteophytes removed.  The acetabular reaming is initiated, starting at 43 mm, coursing  increments of 2-49 mm, then a 50-mm Pinnacle acetabular shell is placed  in anatomic position with excellent purchase.  I did not feel it was  necessary to perform any additional screw fixation.  We then placed the  apex hole eliminator and placed the permanent 36-mm neutral Ultramet  liner for metal-on-metal hip replacement.   Femur is repaired with the canal finder and irrigation.  Axial reaming  was performed to 15.5 mm, proximal reaming to a 20D and the sleeve  machined to a large.  A 20D large trial sleeve is placed with 20 x 15  stem and 36 + 8 neck, matching native anteversion.  A 36 + 0 head is  placed and the hip is reduced with excellent stability.  There  is full  extension, full external rotation, 70 degrees flexion, 40 degrees  adduction, 90 degrees internal rotation and 90 degrees of flexion and 70  degrees of internal rotation.  By placing the right leg on top of the  left it felt as though leg lengths were equal.   Hip was then dislocated and all trials removed.  The permanent 20D large  sleeve is placed with the 20 x 15 stem and 36 + 8 neck, matching native  anteversion.  A 36 + 0 head is placed and the hips reduced with the same  stability parameters.  Wound was copiously irrigated with saline  solution and short rotators reattached to femur through drill holes.  Fascia lata was closed over a Hemovac drain with interrupted #1 Vicryl,  subcu closed with #1-0 and #2-0 Vicryl and subcuticular running 4-0  Monocryl.  The incisions were  cleaned and dried and Steri-Strips and  bulky sterile dressing applied.  She was then awakened and transferred  to recovery in stable condition.      Ollen Gross,  M.D.  Electronically Signed     FA/MEDQ  D:  12/16/2008  T:  12/16/2008  Job:  [id number]

## 2011-03-14 NOTE — H&P (Signed)
NAMEFLOYD, Victoria Mosley             ACCOUNT NO.:  [id number]   MEDICAL RECORD NO.:  [id number]          PATIENT TYPE:  INP   LOCATION:  NA                           [facility]   PHYSICIAN:  Ollen Gross, M.D.    DATE OF BIRTH:  1950/11/13   DATE OF ADMISSION:  12/16/2008  DATE OF DISCHARGE:                              HISTORY & PHYSICAL   CHIEF COMPLAINT:  Right hip pain.   HISTORY OF PRESENT ILLNESS:  The patient is a 60 year old female who has  been seen by Dr. Lequita Halt for ongoing right hip pain.  She does recall an  onset of injury back in April 2009.  When she was working in the yard  cleaning up, she bent over and had a little discomfort in the hip and  groin regions, fairly minimal that day, but it got worse.  She has had  ongoing symptoms which have been intermittent.  She has been seen at  Urgent Care and given anti-inflammatories.  She followed up with Dr.  Penni Bombard, then eventually was referred over to Dr. Lequita Halt.  Unfortunately from April up until January this year, symptoms have  progressively gotten worse and it is felt that she has gone on to  develop a rapidly progressive component of osteoarthritis.  She has gone  on from x-rays back in November when there was essentially no collapse  over to basically a bone-on-bone in January of this year.  It is felt  she would benefit from undergoing surgical intervention.  The risks and  benefits have been discussed and she elected to proceed with surgery.  She has been seen by Dr. Clent Ridges and felt to be stable for surgery.   ALLERGIES:  NO KNOWN DRUG ALLERGIES.   INTOLERANCES:  Biaxin - severe stomach upset.  Cipro - severe stomach  upset.   CURRENT MEDICATIONS:  Nadolol, Fioricet, Macrobid, prednisone, tramadol,  omeprazole, loratadine, baby aspirin, Aleve, Excedrin Migraine, calcium  plus D, vitamin C, potassium gluconate, vitamin B12, Omega-3 fish oil,  flaxseed oil, Omega Red, Omega-3 krill oil, Resveratrol grape  extract  and red yeast rice.   PAST MEDICAL HISTORY:  1. History of migraines.  2. Anxiety.  3. Slight tinnitus.  4. Past history of bronchitis.  5. History of pneumonia.  6. Hypercholesterolemia.  7. Reflux disease.  8. Irritable bowel syndrome.  9. Diverticulosis with recent flare.  10.Hemorrhoids.  11.History of urinary tract infections.  12.Cystitis.  13.Renal calculi.  14.History of melanoma skin cancer right arm.  15.Degenerative disk disease.  16.Postmenopausal.  17.History of measles and mumps.   PAST SURGICAL HISTORY:  1. Cesarean section in March 1982.  2. Foot surgery in April 1987.  3. Tubal ligation in April 1989.  4. Deviated septum surgery in April 1992, and again in September 1992.  5. Disk surgery in January 1993.  6. Kidney stone surgery in March 2005.  7. Lithotripsy for kidney stones in April 2007.   FAMILY HISTORY:  Father with history of hypertension and CLL.  Mother  heart disease.  Brother age 31 and two half sister ages 16 and 81.  SOCIAL HISTORY:  Married.  A billing supervisor.  Past smoker, about a  quarter-pack a day for 15 years, quit around 2000.  Very seldom intake  of alcohol.  One child.  Husband will be assisting with care after  surgery.  She does have a living will and healthcare power of attorney.   REVIEW OF SYSTEMS:  GENERAL:  A little bit of fatigue.  She also  documents weight change.  No fevers, chills or night sweats.  NEURO:  Occasional headaches with a history of migraines and tinnitus  with ringing, some insomnia.  No seizure, syncope or paralysis.  RESPIRATORY:  No shortness breath, productive cough or hemoptysis.  CARDIOVASCULAR:  No chest pain, angina or orthopnea.  GI:  She does have reflux, but no nausea, vomiting, diarrhea or  constipation.  GU:  A little bit of frequency, nocturia.  No dysuria or hematuria.  MUSCULOSKELETAL:  Joint pain.   PHYSICAL EXAMINATION:  VITAL SIGNS:  Pulse 68, respirations 12, blood   pressure 172/80.  GENERAL:  A 60 year old white female, well-nourished, well-developed,  mildly anxious.  She is accompanied by her husband.  She does wear  glasses.  HEENT:  Normocephalic, atraumatic.  Pupils round and reactive.  EOMs  intact.  NECK:  Supple.  CHEST:  Clear.  HEART:  Regular rate and rhythm.  No murmur, S1-S2.  ABDOMEN:  Soft, nontender.  Bowel sounds present.  RECTAL/BREASTS/GENITALIA:  Not done, not pertinent to present illness.  EXTREMITIES:  Right hip flexion 100, internal rotation 10, external  rotation 20, abduction 20.   IMPRESSION:  Osteoarthritis right hip.   PLAN:  The patient admitted to [facility] to undergo a right  total replacement arthroplasty.  The surgery will be performed by Dr.  Ollen Gross.      Alexzandrew L. Perkins, P.A.C.      Ollen Gross, M.D.  Electronically Signed    ALP/MEDQ  D:  12/15/2008  T:  12/15/2008  Job:  [id number]   cc:   Ollen Gross, M.D.  Fax: [phone]   Tera Mater. Clent Ridges, MD  [address] [place]  [place] [address]

## 2011-03-14 NOTE — Telephone Encounter (Signed)
Pt scheduled for a stress test, had one once before and has a few questions

## 2011-03-17 NOTE — Discharge Summary (Signed)
NAME:  Victoria Mosley, Victoria Mosley                       ACCOUNT NO.:  [id number]   MEDICAL RECORD NO.:  [id number]                   PATIENT TYPE:  INP   LOCATION:  0357                                 FACILITY:  [facility]   PHYSICIAN:  Rene Paci, M.D. [facility]          DATE OF BIRTH:  [birth year]   DATE OF ADMISSION:  11/23/2003  DATE OF DISCHARGE:  11/27/2003                                 DISCHARGE SUMMARY   DISCHARGE DIAGNOSES:  1. Abdominal pain with nausea, vomiting, diarrhea secondary to viral     gastroenteritis, improved status post gastrointestinal evaluation, no     diverticulitis, symptomatically improved.  2. Acute sinusitis, continue empiric treatment with Avelox.  3. Acute on chronic migraine headache.   DISCHARGE MEDICATIONS:  1. Magic Mouthwash 5 mL swish and swallow 4 times daily for 2 to 3 weeks.  2. Robinul Forte 2 mg b.i.d. for IBS cramping.  3. Imodium p.r.n. diarrhea.  4. Ortho-Evra 0.625 mg p.o. daily.  5. Provera 2.5 mg p.o. daily.  6. Zantac 150 mg p.o. b.i.d.  7. Atenolol 50 mg p.o. daily.  8. Avelox 400 mg p.o. daily x7 additional days to complete a 10-day course.  9. Phenergan 25 mg p.o. q.4-6 h. p.r.n.  10.      Zofran 4 mg p.o. q.6 h. p.r.n.   HOSPITAL FOLLOW UP:  Is to be with Dr. Alonza Smoker, primary care physician, in  1 to 2 weeks and Dr. Marina Goodell of GI as needed.   PROCEDURE:  Included a CT of the abdomen and pelvis done January 25th  without acute findings.   CONDITION ON DISCHARGE:  Medically stable.   HOSPITAL COURSE:  ACUTE VIRAL GASTROENTERITIS:  The patient is a 60 year old  woman who presented to her primary care physician with abdominal pain in the  left lower quadrant associated with nausea, vomiting, and diarrhea and a low-  grade temperature admitted for evaluation and to rule out diverticulitis.  GI evaluated the patient on January 25th and ordered CT to further evaluate  this.  She was begun on IV Cipro and Flagyl for low-grade temperature  which  resulted in normalization of temperature curve.  The patient's GI symptoms  improved though she had exacerbation of her sinusitis as well as chronic  migraines during her hospitalization.  CT of the abdomen showed no  diverticulitis and it was felt that she likely had a viral gastroenteritis  with spontaneous resolution.  Cipro and Flagyl were discontinued on the 25th  and her diet was advanced without difficulty.  However, she was returned to  antibiotics for treatment of acute sinusitis symptoms.  From the day of  discharge, she was tolerating p.o. without difficulty and stable for  outpatient follow up.   LABORATORY DATA:  She had normal CMET.  White count normalization to 5.1.  Hemoglobin of 11.8, platelet count of 273.  UA negative.  Rene Paci, M.D. [facility]    VL/MEDQ  D:  04/12/2004  T:  04/12/2004  Job:  [id number]

## 2011-03-17 NOTE — Op Note (Signed)
NAME:  ICEL, CASTLES                       ACCOUNT NO.:  [id number]   MEDICAL RECORD NO.:  [id number]                   PATIENT TYPE:  AMB   LOCATION:  NESC                                 FACILITY:  [facility]   PHYSICIAN:  Boston Service, M.D.             DATE OF BIRTH:  Feb 29, 1951   DATE OF PROCEDURE:  01/06/2004  DATE OF DISCHARGE:                                 OPERATIVE REPORT   PREOPERATIVE DIAGNOSIS:  A 4x7 mm stone within the distal left ureter about  3 cm proximal to the left ureterovesical junction.  Make reference to CT  report by Dr. Paulina Fusi from January 04, 2004 at 3:50 p.m.  Patient has had  persistent intractable pain.  Nausea but no vomiting.  Denies fever.  Plan  is made for ureteroscopy and stone manipulation.   POSTOPERATIVE DIAGNOSIS:  Same.   PROCEDURE:  Cystoscopy, retrograde ureteroscopy, stone manipulation.   ANESTHESIA:  General.   DRAINS:  None.   COMPLICATIONS:  None.   DESCRIPTION OF PROCEDURE:  The patient was prepped and draped in the dorsal  lithotomy position after institution of an adequate level of general  anesthesia.  A well-lubricated 21 French pan endoscope was gently inserted  at the urethral meatus.  Normal urethra and sphincter.  Normal trigone with  clear efflux at the right orifice.  No efflux at the left orifice.  Right  retrograde showed normal course and caliber of the ureter, pelvis, and  calices with prompt drainage in 3-5 minutes.  Left retrograde showed J  deformity of the distal ureter with elongated filling defect of about 4 x 7  mm and proximal hydroureter.  Guidewire was gently negotiated beyond the  stone with immediate efflux of blood-tinged purulent urine from the left  ureteral orifice.  This hydronephrotic jet was allowed to subside over a  period of about 5-10 minutes.  The end-hole catheter was advanced over the  guidewire.  Once the distal ureter was well dilated, a 6 French ureteroscope  was inserted alongside  the guidewire.  The stone was identified with  adjacent purulent debris.  This was irrigated through the distal ureter  until the stone could be clearly visualized.  The stone was then negotiated  into the flat wire basket and then gently withdrawn.  Ureteroscope was then  reinserted.  Careful inspection of the distal two-thirds of the ureter to  the limb of the short 6 [place] scope showed no ureteral trauma or remaining  calculi within the distal ureter.  The ureteroscope was withdrawn.  Bladder  was drain.  Patient was given a B&O suppository and 30 mg of Toradol and  returned to recovery in satisfactory condition.  Boston Service, M.D.    RH/MEDQ  D:  01/06/2004  T:  01/06/2004  Job:  [id number]

## 2011-03-17 NOTE — Op Note (Signed)
NAMETEKIA, WATERBURY             ACCOUNT NO.:  [id number]   MEDICAL RECORD NO.:  [id number]          PATIENT TYPE:  AMB   LOCATION:  NESC                         FACILITY[facility]  [facility]   PHYSICIAN:  Boston Service, M.D.DATE OF BIRTH:  09-13-1951   DATE OF PROCEDURE:  02/07/2006  DATE OF DISCHARGE:                                 OPERATIVE REPORT   PREOPERATIVE DIAGNOSIS:  5 mm stone right distal ureter.   POSTOPERATIVE DIAGNOSIS:  5 mm stone right distal ureter.   PROCEDURE:  Cystoscopy, retrograde ureteroscopy, stent placement.   ANESTHESIA:  General.   DRAINS:  6 French 26 cm double J stent.   COMPLICATIONS:  Migration of the stone to the proximal ureter.   DESCRIPTION OF PROCEDURE:  The patient was prepped and draped in the dorsal  lithotomy position after institution of an adequate level of general  anesthesia.  A well lubricated 21 French panendoscope was gently inserted at  the urethral meatus, normal urethra and sphincter.  Clear efflux at the left  orifice, minimal reflux at the right orifice.  A blocking catheter was  selected, placed at the left ureteral orifice with gentle injection of  contrast, the ureter, pelvis and calyces were outlined without filling  defect or obstruction.  Right retrograde was performed.  The distal ureter  was atretic, almost thread-like in nature. With gentle injection of  contrast, there was apparently a 2-3 cm segment of the distal ureter that  was quite narrow. With continued injection of contrast a dilated proximal  ureter was demonstrated.  Unfortunately, the stone began to migrate  proximally with additional injection of contrast.  A guidewire was then  inserted at the right ureteral orifice.  An attempt was made to negotiate  the guidewire beyond the stone, however as the guidewire was advanced, the  stone began to migrate into the proximal ureter which was quite  hydronephrotic.  A guidewire was then advanced into the upper pole  calyces,  postobstructive diuresis was allowed to resolve. There was prolonged efflux  of concentrated urine from the right ureteral orifice.  Once this had  subsided, the short 6 French ureteroscope was then passed to the proximal  third of the right ureter.  No stone was identified.  The stone appeared to  be within the mid pole calyces. A long 6 French ureteroscope was then  inserted alongside the guidewire and the stone was not directly visualized.  The ureteroscope was withdrawn.  A 6 French 26 cm double J  stent was then passed over the indwelling guidewire with excellent pigtail  formation on guidewire removal.  There was prompt efflux of concentrated  urine through the fenestrations of the double J stent.  The bladder was  drained.  The patient was given a BNO suppository and returned to recovery  in satisfactory condition.           ______________________________  Boston Service, M.D.     RH/MEDQ  D:  02/07/2006  T:  02/07/2006  Job:  [id number]   cc:   Marcial Pacas P. Fontaine, M.D.  Fax: [phone]   Eugenio Hoes. Tawanna Cooler, M.D.  [facility]  [address] [place]  [place] [address]

## 2011-03-17 NOTE — H&P (Signed)
NAME:  Victoria Mosley, Victoria Mosley                       ACCOUNT NO.:  [id number]   MEDICAL RECORD NO.:  [id number]                   PATIENT TYPE:  INP   LOCATION:  0357                                 FACILITY:  [facility]   PHYSICIAN:  Eugenio Hoes. Tawanna Cooler, M.D. [facility]           DATE OF BIRTH:  21-Sep-1951   DATE OF ADMISSION:  11/23/2003  DATE OF DISCHARGE:                                HISTORY & PHYSICAL   PRIMARY CARE PHYSICIAN:  Tinnie Gens A. Tawanna Cooler, M.D.   HOSPITALIST:  Rene Paci, M.D.   CONCLUSION:  GI, Dr. Wilhemina Cash. al.   HISTORY OF PRESENT ILLNESS:  This 60 year old married white female comes to  the office today as an acute emergency because of abdominal pain.   The patient stated she was feeling well until Thursday when in [place] they  were getting ready to go on a cruise ship.  She began feeling queasy, did  not want to ruin the vacation for her husband, so she got on the boat.  The  pain got worse, she became nauseated, had intermittent fevers, but no  documentation of high they were.  She was unable to eat and lost 4 pounds.  They got back and she came here today for evaluation.   Had a full GI workup by Dr. Marina Goodell in December 2004.  At that time, she had a  normal upper endoscopy.  He recommended doubling her Prilosec because of her  symptoms.  She had a polyp in her colon, some diverticuli in the descending  colon.  Otherwise, her colonoscopy was negative.  She recovered and had no  sequelae.   She also states she has had some vomiting, but no vomiting today.  She  states she had a normal loose bowel movement today, she has had no diarrhea.  She states she had never been sick like this before.   PAST MEDICAL HISTORY:  1. Cesarean section.  2. Foot surgery.  3. BTL.  4. Deviated nasal septum.  5. Ruptured disk surgery in 1993.   Past illnesses other than above negative.   INJURIES:  Negative.   ALLERGIES:  No known drug allergies.   CURRENT MEDICATIONS:  1. Allergy  shots.  2. Ortho-S 0.625.  3. Provera 2.5.  4. Septra DS 1/2 tablet b.i.d.  She was put on that on the cruise ship     because she was told she had an UTI.  5. Zantac 150 two tabs daily.  6. Atenolol 50 daily for migraine prevention.  7. She also has a number of p.r.n. medications, see her dictated list.  Plus     she is on a number of roots and herbs.   REVIEW OF SYSTEMS:  GENERAL:  She does have migraine headaches.  She treats  them prophylactically with beta blocker, and she has break-through  medications to take p.r.n.  She does wear reading glasses.  She gets regular  dental and eye check-ups.  CARDIOPULMONARY:  Negative.  GASTROINTESTINAL:  Negative except as noted above.  GENITOURINARY:  Negative.  GYNECOLOGIC:  She is gravida 1, para 1, AB 0.  She is still taking HRT at the direction of  her gynecologist.  Back surgery of L5 disk was done by Dr. Corlis Hove  years ago.   SOCIAL HISTORY:  She works at [employer].  She enjoys traveling.  Married.  This is her second marriage.   FAMILY HISTORY:  Dad 15, alive and well.  Mother died at 40 of CHF and high  blood pressure.  One brother who is mentally retarded.  A half sister, Lucendia Herrlich,  has coronary artery disease, has hypertension, and angioplasty.  Another  half sister who has had a pacemaker put in.   VACCINATION HISTORY:  Last tetanus was 2003.   PHYSICAL EXAMINATION:  VITAL SIGNS:  The weight was 170, temperature 99,  pulse 80 and regular, respirations 12 and regular, blood pressure 120/80.  GENERAL:  She is a well-developed, well-nourished white female in acute  pain.  She says when she is still her pain is a 4, when she moves it is an 8  to 9.  HEENT:  Negative.  NECK:  Supple, thyroid not enlarged, no carotid bruits.  CHEST:  Clear to auscultation.  CARDIAC:  Negative.  ABDOMEN:  The abdomen was somewhat distended.  There was tenderness in the  left upper quadrant, no rebound.  Bowel sounds were normal.  RECTAL:   Normal, stool guaiac negative.  EXTREMITIES:  Normal skin, peripheral pulses normal.   IMPRESSION AND PLAN:  1. Abdominal pain, most likely consistent with acute diverticulitis.  Admit.     Give her 100 of Demerol and 25 of Phenergan IM now because of severe     pain.  Husband will take her to [facility].  Admission was arranged.     She will be seen by Dr. Felicity Coyer, our hospitalist, and also GI for     evaluation in the hospital.  2. History of migraine headaches.  3. History of allergic rhinitis.  4. History of gastroesophageal reflux disease.  5. Status post child birth x1.  6. Status post lumbar disk surgery at L5.  7. Status post bilateral tubal ligation.   The patient will be started on IV fluids, IV antibiotics, and seen in  consultation via Dr. Felicity Coyer, hospitalist, and GI, Dr. Marina Goodell.  Both called  and kindly agreed to follow her.                                               Jeffrey A. Tawanna Cooler, M.D. [facility]    JAT/MEDQ  D:  11/23/2003  T:  11/23/2003  Job:  [id number]

## 2011-03-17 NOTE — Discharge Summary (Signed)
NAMELAQUIDA, COTRELL             ACCOUNT NO.:  [id number]   MEDICAL RECORD NO.:  [id number]          PATIENT TYPE:  INP   LOCATION:  1610                         FACILITY:  [facility]   PHYSICIAN:  Ollen Gross, M.D.    DATE OF BIRTH:  09-25-1951   DATE OF ADMISSION:  12/16/2008  DATE OF DISCHARGE:  12/20/2008                               DISCHARGE SUMMARY   ADMITTING DIAGNOSES:  1. Osteoarthritis right hip.  2. Migraines.  3. Anxiety.  4. Slight tinnitus.  5. Past history of bronchitis.  6. History of pneumonia.  7. Hypercholesterolemia.  8. Reflux disease.  9. Irritable bowel syndrome.  10.Diverticulosis with recent flare.  11.Hemorrhoids.  12.History of urinary tract infections.  13.Cystitis.  14.Renal calculi.  15.History of melanoma skin cancer right arm.  16.Degenerative disk disease.  17.Postmenopausal.  18.Childhood illnesses of measles and mumps.   DISCHARGE DIAGNOSES:  1. 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  2. Mild postop hyponatremia, stable.  3. Migraines.  4. Anxiety.  5. Slight tinnitus.  6. Past history of bronchitis.  7. History of pneumonia.  8. Hypercholesterolemia.  9. Reflux disease.  10.Irritable bowel syndrome.  11.Diverticulosis with recent flare.  12.Hemorrhoids.  13.History of urinary tract infections.  14.Cystitis.  15.Renal calculi.  16.History of melanoma skin cancer right arm.  17.Degenerative disk disease.  18.Postmenopausal.  19.Childhood illnesses of measles and mumps.   PROCEDURE:  On January 13, 2009, right total hip replacement.  Surgeon,  Dr. Lequita Halt.  Assistant, Alexzandrew L. Perkins, P.A.C.  Anesthesia was  general.   CONSULTS:  None.   BRIEF HISTORY:  Anieya was a 60 year old female with rapidly progressive  OA right hip with significant pain and dysfunction felt to benefit from  undergoing surgery.  Risks and benefits discussed.  The patient was  subsequently admitted to  the hospital.   LABORATORY DATA:  Preop CBC hemoglobin 13.9, hematocrit 40.9, white cell  count 5.3, platelets 219.  Postop hemoglobin 11.4 drifted down, last H  and H 10.7 and 31.5.  PT and PTT preop 13.5 and 39 respectively.  INR  1.0.  Serial pro-times followed per Coumadin protocol.  Last PT/INR 25.1  and 2.1.  Chem panel on admission all within normal limits.  __________  dropped from 142 to 135 down to 134 where it stayed on labs prior to  discharge.  Preop UA small leukocyte esterase, rare epithelials, 3-6  white, only rare bacteria.  Blood group type O positive.   X-RAYS:  Right hip film December 10, 2008, right hip changes consistent  with osteoarthritis, no fracture or acute abnormality.  Portable pelvis  December 16, 2008, and portable right hip film expected appearance of  right hip arthroplasty.   HOSPITAL COURSE:  The patient was admitted to [facility] and  taken to the OR and underwent the above stated procedure without  complications.  The patient tolerated the procedure well.  Did have a  little bit of nausea postop.  Had scopolamine patch, removed that on the  morning of day 1.  Encouraged p.o. medications.  Weaned off PCA and  started back on her home medications.  Had a history of urinary tract  infection but preop UA was okay.  Had excellent urinary output.  Started  getting out of bed on day 1 and walked short distance about 3 feet.  By  day 2 she was going about 30-40 feet though.  A little bit of soreness  with mobility otherwise the incision was healing well.  She was doing  well from pain control standpoint.  Discontinued the Foley and IVs.  She  continued to get up and ambulating, walking about 75 feet by day 3.  Doing a little bit better control but still had a little slight nausea  so she was encouraged to p.o. intake and was walking some short  distances.  However, by day 4 the nausea had improved, she was walking  well and was discharged  home.   DISCHARGE PLAN:  1. Discharged on December 20, 2008.  2. Discharge diagnoses, please see above.   DISCHARGE MEDICATIONS:  Percocet, Robaxin and Coumadin.   DIET:  Low cholesterol, heart healthy diet.   ACTIVITY:  She is partial weightbearing 25-50% right leg, PT and OT,  gait training, ambulation, ADLs, hip precautions.  Follow up in 2 weeks.   DISPOSITION:  Home.   CONDITION UPON DISCHARGE:  Improved.      Alexzandrew L. Perkins, P.A.C.      Ollen Gross, M.D.  Electronically Signed    ALP/MEDQ  D:  01/23/2009  T:  01/23/2009  Job:  [id number]

## 2011-03-20 ENCOUNTER — Ambulatory Visit (HOSPITAL_COMMUNITY): Payer: 59 | Attending: Family Medicine | Admitting: Radiology

## 2011-03-20 VITALS — Ht 67.0 in | Wt 185.0 lb

## 2011-03-20 DIAGNOSIS — Z136 Encounter for screening for cardiovascular disorders: Secondary | ICD-10-CM

## 2011-03-20 DIAGNOSIS — R079 Chest pain, unspecified: Secondary | ICD-10-CM

## 2011-03-20 DIAGNOSIS — R9431 Abnormal electrocardiogram [ECG] [EKG]: Secondary | ICD-10-CM | POA: Insufficient documentation

## 2011-03-20 MED ORDER — TECHNETIUM TC 99M TETROFOSMIN IV KIT
11.0000 | PACK | Freq: Once | INTRAVENOUS | Status: AC | PRN
  Administered 2011-03-20: 11 via INTRAVENOUS

## 2011-03-20 MED ORDER — REGADENOSON 0.4 MG/5ML IV SOLN
0.4000 mg | Freq: Once | INTRAVENOUS | Status: AC
  Administered 2011-03-20: 0.4 mg via INTRAVENOUS

## 2011-03-20 MED ORDER — TECHNETIUM TC 99M TETROFOSMIN IV KIT
33.0000 | PACK | Freq: Once | INTRAVENOUS | Status: AC | PRN
  Administered 2011-03-20: 33 via INTRAVENOUS

## 2011-03-20 NOTE — Progress Notes (Signed)
[facility] SITE 3 NUCLEAR MED [address] [place] [address] [phone]  Cardiology Nuclear Med Study  Victoria Mosley is a 60 y.o. female [id number] 01/11/1951   Nuclear Med Background Indication for Stress Test:  Evaluation for Ischemia and Abnormal EKG History: 2008 GXT- NL and 2005 Myocardial Perfusion Study NL Cardiac Risk Factors: Lipids  Symptoms:  Chest Pain   Nuclear Pre-Procedure Caffeine/Decaff Intake:  None NPO After: 600pm   Lungs:  Clear IV 0.9% NS with Angio Cath:  22g  IV Site: R Wrist  IV Started by:  Cathlyn Parsons, RN  Chest Size (in):  40 Cup Size: C  Height: 5\' 7"  (1.702 m)  Weight:  185 lb (83.915 kg)  BMI:  Body mass index is 28.97 kg/(m^2). Tech Comments:  Corgard held x 14 hrs and no Fioricet in 48hrs.    Nuclear Med Study 1 or 2 day study: 1 day  Stress Test Type:  Eugenie Birks  Reading MD: Kristeen Miss, MD  Order Authorizing Provider:  Dr. Claris Che  Resting Radionuclide: Technetium 75m Tetrofosmin  Resting Radionuclide Dose: 11 mCi   Stress Radionuclide:  Technetium 67m Tetrofosmin  Stress Radionuclide Dose: 33 mCi           Stress Protocol Rest HR: 61 Stress HR: 92  Rest BP: 127/77 Stress BP: 122/67  Exercise Time (min): n/a METS: n/a   Predicted Max HR: 162 bpm % Max HR: 56.79 bpm Rate Pressure Product: 78469   Dose of Adenosine (mg):  n/a Dose of Lexiscan: 0.4 mg  Dose of Atropine (mg): n/a Dose of Dobutamine: n/a mcg/kg/min (at max HR)  Stress Test Technologist: Milana Na, EMT-P  Nuclear Technologist:  Domenic Polite, CNMT     Rest Procedure:  Myocardial perfusion imaging was performed at rest 45 minutes following the intravenous administration of Technetium 34m Tetrofosmin. Rest ECG: NSR with non-specific ST-T wave changes  Stress Procedure:  The patient received IV Lexiscan 0.4 mg over 15-seconds.  Technetium 6m Tetrofosmin injected at 30-seconds.  There were no significant changes with Lexiscan.   Quantitative spect images were obtained after a 45 minute delay. Stress ECG: No significant change from baseline ECG  QPS Raw Data Images:  There is a breast shadow that accounts for the anterior attenuation. Stress Images:  There is decreased uptake in the apex. Rest Images:  There is decreased uptake in the apex. Subtraction (SDS):  There is a fixed anteriour defect that is most consistent with breast attenuation. Transient Ischemic Dilatation (Normal <1.22):  1.10 Lung/Heart Ratio (Normal <0.45):  .39  Quantitative Gated Spect Images QGS EDV:  79 ml QGS ESV:  20 ml QGS cine images:  NL LV Function; NL Wall Motion QGS EF: 75%  Impression Exercise Capacity:  Adenosine study with no exercise. BP Response:  Normal blood pressure response. Clinical Symptoms:  No chest pain. ECG Impression:  No significant ST segment change suggestive of ischemia. Comparison with Prior Nuclear Study: No previous nuclear study performed  Overall Impression:  Low risk stress nuclear study.  No evidence of ischemia.  There is apical attenuation that appears to be due to a breast shadow.  The LV function is normal.   Elyn Aquas., MD, [facility]

## 2011-03-21 ENCOUNTER — Telehealth: Payer: Self-pay | Admitting: Family Medicine

## 2011-03-21 NOTE — Telephone Encounter (Signed)
Her stress test was totally normal. The changes seen on her EKG are nothing to worry about.

## 2011-03-21 NOTE — Progress Notes (Signed)
Copy routed to Dr. Clent Ridges.Mirna Mires

## 2011-03-21 NOTE — Telephone Encounter (Signed)
Pt req to get Stress Test results and clarification of EKG results.

## 2011-03-22 NOTE — Telephone Encounter (Signed)
Spoke w/patient's husband to inform of Stress Test results and let Pt know that changes on Ekg were nothing to worry about.

## 2011-07-26 ENCOUNTER — Encounter: Payer: Self-pay | Admitting: Women's Health

## 2011-07-26 ENCOUNTER — Ambulatory Visit (INDEPENDENT_AMBULATORY_CARE_PROVIDER_SITE_OTHER): Payer: 59 | Admitting: Women's Health

## 2011-07-26 VITALS — BP 130/70

## 2011-07-26 DIAGNOSIS — R8290 Unspecified abnormal findings in urine: Secondary | ICD-10-CM

## 2011-07-26 DIAGNOSIS — K589 Irritable bowel syndrome without diarrhea: Secondary | ICD-10-CM | POA: Insufficient documentation

## 2011-07-26 DIAGNOSIS — N390 Urinary tract infection, site not specified: Secondary | ICD-10-CM

## 2011-07-26 DIAGNOSIS — R82998 Other abnormal findings in urine: Secondary | ICD-10-CM

## 2011-07-26 MED ORDER — SULFAMETHOXAZOLE-TRIMETHOPRIM 800-160 MG PO TABS: 1.0000 | ORAL_TABLET | Freq: Two times a day (BID) | ORAL | Status: AC

## 2011-07-26 NOTE — Progress Notes (Signed)
  Presents with a complaint of urinary frequency, urgency, burning and a strong odor with her urine for 3 days. History of UTIs in the past, also kidney stones. No CVAT. Denies any vaginal discharge or fever. UA positive for TNTC WBCs,  TNTC rbc's, +3 bacteria, positive nitrites.  UTI, sensitivity to Cipro/ nausea, vomiting  Plan: Septra DS one by mouth twice a day for 5 days, UTI prevention discussed, increase fluids, return to office in 2 weeks for a test of cure UA/culture.

## 2011-08-07 ENCOUNTER — Ambulatory Visit: Payer: 59 | Admitting: Women's Health

## 2011-08-07 DIAGNOSIS — R82998 Other abnormal findings in urine: Secondary | ICD-10-CM

## 2011-08-07 DIAGNOSIS — Z01419 Encounter for gynecological examination (general) (routine) without abnormal findings: Secondary | ICD-10-CM

## 2011-08-08 MED ORDER — NITROFURANTOIN MONOHYD MACRO 100 MG PO CAPS: 100.0000 mg | ORAL_CAPSULE | Freq: Two times a day (BID) | ORAL | Status: AC

## 2011-08-09 ENCOUNTER — Ambulatory Visit: Payer: 59

## 2011-09-13 ENCOUNTER — Telehealth: Payer: Self-pay | Admitting: Family Medicine

## 2011-09-13 NOTE — Telephone Encounter (Signed)
Pt stated it's time for another bone density test last one 08-04-2009 per pt. Can I sch?

## 2011-09-14 NOTE — Telephone Encounter (Signed)
Yes please set up a DEXA for osteoporosis

## 2011-09-15 NOTE — Telephone Encounter (Signed)
Pt is sch for dexa on 10-04-2011 4pm

## 2011-10-04 ENCOUNTER — Telehealth: Payer: Self-pay | Admitting: Family Medicine

## 2011-10-04 ENCOUNTER — Ambulatory Visit (INDEPENDENT_AMBULATORY_CARE_PROVIDER_SITE_OTHER)
Admission: RE | Admit: 2011-10-04 | Discharge: 2011-10-04 | Disposition: A | Discharge status: Home / Self Care | Payer: 59 | Source: Ambulatory Visit

## 2011-10-04 DIAGNOSIS — Z13820 Encounter for screening for osteoporosis: Secondary | ICD-10-CM

## 2011-10-04 NOTE — Telephone Encounter (Signed)
[facility] called and requested a bone density scan order, I put in computer.

## 2011-10-12 ENCOUNTER — Encounter: Payer: Self-pay | Admitting: Family Medicine

## 2011-10-12 ENCOUNTER — Telehealth: Payer: Self-pay | Admitting: Family Medicine

## 2011-10-12 NOTE — Telephone Encounter (Signed)
I called pt and gave bone density results. She wants to know if she is taking enough of Vitamin D? She take a calcium supplement that has Vit D in it and also a multivitamin, not sure of the amounts. Also does she get the scan every 2 years still or sooner?

## 2011-10-12 NOTE — Progress Notes (Signed)
Quick Note:  Spoke with pt ______ 

## 2011-10-13 NOTE — Telephone Encounter (Signed)
Spoke with pt

## 2011-10-13 NOTE — Telephone Encounter (Signed)
She should get a DEXA every 2 years, and she should be taking 2000 of vitamin D daily

## 2011-10-31 DIAGNOSIS — K5792 Diverticulitis of intestine, part unspecified, without perforation or abscess without bleeding: Secondary | ICD-10-CM

## 2011-10-31 HISTORY — DX: Diverticulitis of intestine, part unspecified, without perforation or abscess without bleeding: K57.92

## 2011-11-07 ENCOUNTER — Encounter: Payer: Self-pay | Admitting: Gynecology

## 2011-11-13 ENCOUNTER — Other Ambulatory Visit: Payer: Self-pay | Admitting: *Deleted

## 2011-11-13 ENCOUNTER — Encounter: Payer: Self-pay | Admitting: Internal Medicine

## 2011-11-13 DIAGNOSIS — N63 Unspecified lump in unspecified breast: Secondary | ICD-10-CM

## 2011-11-14 ENCOUNTER — Encounter: Payer: Self-pay | Admitting: Family Medicine

## 2011-11-14 ENCOUNTER — Other Ambulatory Visit: Payer: Self-pay | Admitting: Gynecology

## 2011-11-14 DIAGNOSIS — N63 Unspecified lump in unspecified breast: Secondary | ICD-10-CM

## 2012-01-02 ENCOUNTER — Ambulatory Visit (INDEPENDENT_AMBULATORY_CARE_PROVIDER_SITE_OTHER): Payer: 59 | Admitting: Gynecology

## 2012-01-02 ENCOUNTER — Encounter: Payer: Self-pay | Admitting: Gynecology

## 2012-01-02 VITALS — BP 120/74 | Ht 67.0 in | Wt 173.0 lb

## 2012-01-02 DIAGNOSIS — Z01419 Encounter for gynecological examination (general) (routine) without abnormal findings: Secondary | ICD-10-CM

## 2012-01-02 DIAGNOSIS — Z79890 Hormone replacement therapy (postmenopausal): Secondary | ICD-10-CM

## 2012-01-02 MED ORDER — ESTROPIPATE 0.75 MG PO TABS: 0.7500 mg | ORAL_TABLET | Freq: Every day | ORAL | Status: DC

## 2012-01-02 MED ORDER — MEDROXYPROGESTERONE ACETATE 2.5 MG PO TABS: 2.5000 mg | ORAL_TABLET | Freq: Every day | ORAL | Status: DC

## 2012-01-02 NOTE — Progress Notes (Signed)
Victoria Mosley 26-Dec-1950 [id number]        61 y.o.  for annual exam.  Doing well.  Past medical history,surgical history, medications, allergies, family history and social history were all reviewed and documented in the EPIC chart. ROS:  Was performed and pertinent positives and negatives are included in the history.  Exam: Amy chaperone present Filed Vitals:   01/02/12 1559  BP: 120/74   General appearance  Normal Skin grossly normal Head/Neck normal with no cervical or supraclavicular adenopathy thyroid normal Lungs  clear Cardiac RR, without RMG Abdominal  soft, nontender, without masses, organomegaly or hernia Breasts  examined lying and sitting without masses, retractions, discharge or axillary adenopathy. Pelvic  Ext/BUS/vagina  normal with mild atrophic changes  Cervix  normal    Uterus  axial, normal size, shape and contour, midline and mobile nontender   Adnexa  Without masses or tenderness    Anus and perineum  normal   Rectovaginal  normal sphincter tone without palpated masses or tenderness.    Assessment/Plan:  61 y.o. female for annual exam.    1. HRT. Patient is on Ogen 0.625 and Provera 2.5 daily. No bleeding good symptom relief. I discussed again the WHI study. The increased risk of stroke heart attack DVT increased risk of breast cancer. The ACOG and NAMS statements for lowest dose for shortest period of time reviewed. Patient wants to continue. She has tried weaning and had significant hot flashes which were unacceptable. I refilled both of her medications for a year. 2. DEXA. She said she was told she had osteopenia per recent DEXA in December at [place]. I pulled the report and in fact report was normal. I gave her copy of this. I did encourage extra calcium vitamin D. 3. Mammography. Patient recently had mammogram was follow up use in January. The side probable benign finding and recommended a repeat in 6 months and she knows to do this. SBE monthly  reviewed. 4. Colonoscopy. Patient is due for colonoscopy now and knows to schedule this and is in the process of arranging it. 5. Pap smear. The Pap smear was done today. Last Pap smear 2012. She has no history of significant abnormalities with multiple normal records in her chart. I discussed current screening guidelines and will plan every 3 year interval. 6. Health maintenance. No blood work was done today as it's all done through her primary physician's office. I did do a urinalysis at her request. Assuming she continues well from a gynecologic standpoint and she will see me in a year, sooner as needed.    Dara Lords MD, 4:36 PM 01/02/2012

## 2012-01-02 NOTE — Patient Instructions (Signed)
Follow up in one year for your annual gynecologic exam. 

## 2012-01-04 ENCOUNTER — Telehealth: Payer: Self-pay | Admitting: *Deleted

## 2012-01-04 LAB — URINALYSIS W MICROSCOPIC + REFLEX CULTURE
Bacteria, UA: NONE SEEN
Bilirubin Urine: NEGATIVE
Casts: NONE SEEN
Glucose, UA: NEGATIVE mg/dL
Hgb urine dipstick: NEGATIVE
Ketones, ur: NEGATIVE mg/dL
Leukocytes, UA: NEGATIVE
Nitrite: NEGATIVE
Protein, ur: NEGATIVE mg/dL
Specific Gravity, Urine: 1.02 (ref 1.005–1.030)
Squamous Epithelial / LPF: NONE SEEN
Urobilinogen, UA: 0.2 mg/dL (ref 0.0–1.0)
pH: 7 (ref 5.0–8.0)

## 2012-01-04 NOTE — Telephone Encounter (Signed)
Pt informed of recent urine culture results.

## 2012-01-22 ENCOUNTER — Encounter: Payer: Self-pay | Admitting: Internal Medicine

## 2012-03-06 ENCOUNTER — Ambulatory Visit (AMBULATORY_SURGERY_CENTER): Payer: 59 | Admitting: *Deleted

## 2012-03-06 VITALS — Ht 67.0 in | Wt 170.9 lb

## 2012-03-06 DIAGNOSIS — Z860100 Personal history of colon polyps, unspecified: Secondary | ICD-10-CM

## 2012-03-06 DIAGNOSIS — Z1211 Encounter for screening for malignant neoplasm of colon: Secondary | ICD-10-CM

## 2012-03-06 MED ORDER — PEG-KCL-NACL-NASULF-NA ASC-C 100 G PO SOLR: 1.0000 | Freq: Once | ORAL | Status: DC

## 2012-03-07 ENCOUNTER — Encounter: Payer: Self-pay | Admitting: Internal Medicine

## 2012-03-28 ENCOUNTER — Encounter: Payer: Self-pay | Admitting: Internal Medicine

## 2012-03-28 ENCOUNTER — Ambulatory Visit (AMBULATORY_SURGERY_CENTER): Payer: 59 | Admitting: Internal Medicine

## 2012-03-28 VITALS — BP 110/70 | HR 65 | Temp 96.5°F | Resp 22 | Ht 67.0 in | Wt 170.0 lb

## 2012-03-28 DIAGNOSIS — D126 Benign neoplasm of colon, unspecified: Secondary | ICD-10-CM

## 2012-03-28 DIAGNOSIS — Z86010 Personal history of colonic polyps: Secondary | ICD-10-CM

## 2012-03-28 DIAGNOSIS — Z1211 Encounter for screening for malignant neoplasm of colon: Secondary | ICD-10-CM

## 2012-03-28 MED ORDER — SODIUM CHLORIDE 0.9 % IV SOLN: 500.0000 mL | INTRAVENOUS | Status: DC

## 2012-03-28 NOTE — Progress Notes (Signed)
Patient did not experience any of the following events: a burn prior to discharge; a fall within the facility; wrong site/side/patient/procedure/implant event; or a hospital transfer or hospital admission upon discharge from the facility. (G8907) Patient did not have preoperative order for IV antibiotic SSI prophylaxis. (G8918)  

## 2012-03-28 NOTE — Patient Instructions (Signed)
YOU HAD AFollowing upper endoscopy (EGD) N ENDOSCOPIC PROCEDURE TODAY AT THE [facility]: Refer to the procedure report that was given to you for any specific questions about what was found during the examination.  If the procedure report does not answer your questions, please call your gastroenterologist to clarify.  If you requested that your care partner not be given the details of your procedure findings, then the procedure report has been included in a sealed envelope for you to review at your convenience later.  YOU SHOULD EXPECT: Some feelings of bloating in the abdomen. Passage of more gas than usual.  Walking can help get rid of the air that was put into your GI tract during the procedure and reduce the bloating. If you had a lower endoscopy (such as a colonoscopy or flexible sigmoidoscopy) you may notice spotting of blood in your stool or on the toilet paper. If you underwent a bowel prep for your procedure, then you may not have a normal bowel movement for a few days.  DIET: Your first meal following the procedure should be a light meal and then it is ok to progress to your normal diet.  A half-sandwich or bowl of soup is an example of a good first meal.  Heavy or fried foods are harder to digest and may make you feel nauseous or bloated.  Likewise meals heavy in dairy and vegetables can cause extra gas to form and this can also increase the bloating.  Drink plenty of fluids but you should avoid alcoholic beverages for 24 hours.  ACTIVITY: Your care partner should take you home directly after the procedure.  You should plan to take it easy, moving slowly for the rest of the day.  You can resume normal activity the day after the procedure however you should NOT DRIVE or use heavy machinery for 24 hours (because of the sedation medicines used during the test).    SYMPTOMS TO REPORT IMMEDIATELY: A gastroenterologist can be reached at any hour.  During normal business hours, 8:30 AM  to 5:00 PM Monday through Friday, call [phone].  After hours and on weekends, please call the GI answering service at [phone] who will take a message and have the physician on call contact you.   Following lower endoscopy (colonoscopy or flexible sigmoidoscopy):  Excessive amounts of blood in the stool  Significant tenderness or worsening of abdominal pains  Swelling of the abdomen that is new, acute  Fever of 100F or higher   FOLLOW UP: If any biopsies were taken you will be contacted by phone or by letter within the next 1-3 weeks.  Call your gastroenterologist if you have not heard about the biopsies in 3 weeks.  Our staff will call the home number listed on your records the next business day following your procedure to check on you and address any questions or concerns that you may have at that time regarding the information given to you following your procedure. This is a courtesy call and so if there is no answer at the home number and we have not heard from you through the emergency physician on call, we will assume that you have returned to your regular daily activities without incident.    INFORMATION ON  DIVERTICULOSIS & HEMORRHOIDS GIVEN TO YOU  SIGNATURES/CONFIDENTIALITY: You and/or your care partner have signed paperwork which will be entered into your electronic medical record.  These signatures attest to the fact that that the information above on  your After Visit Summary has been reviewed and is understood.  Full responsibility of the confidentiality of this discharge information lies with you and/or your care-partner.

## 2012-03-28 NOTE — Op Note (Signed)
[facility] [address] [place], [place]  [address]  COLONOSCOPY PROCEDURE REPORT  PATIENT:  Victoria Mosley, Victoria Mosley  MR#:  [id number] BIRTHDATE:  October 27, 1951, 60 yrs. old  GENDER:  female ENDOSCOPIST:  Wilhemina Bonito. Eda Keys, MD REF. BY:  Surveillance Program Recall, PROCEDURE DATE:  03/28/2012 PROCEDURE:  Colonoscopy with snare polypectomy x 1 ASA CLASS:  Class II INDICATIONS:  history of pre-cancerous (adenomatous) colon polyps, surveillance and high-risk screening ; index 2004 (TAs); f/u 2008  MEDICATIONS:   MAC sedation, administered by CRNA, propofol (Diprivan) 200 mg IV  DESCRIPTION OF PROCEDURE:   After the risks benefits and alternatives of the procedure were thoroughly explained, informed consent was obtained.  Digital rectal exam was performed and revealed no abnormalities.   The LB CF-[id number] endoscope was introduced through the anus and advanced to the cecum, which was identified by both the appendix and ileocecal valve, without limitations.  The quality of the prep was excellent, using MoviPrep.  The instrument was then slowly withdrawn as the colon was fully examined. <<PROCEDUREIMAGES>>  FINDINGS:  A diminutive polyp was found in the sigmoid colon and snared without cautery. No meaningful tissue available for submission. Moderate diverticulosis was found throughout the colon.  Otherwise normal colonoscopy without other polyps, masses, vascular ectasias, or inflammatory changes.   Retroflexed views in the rectum revealed internal hemorrhoids.    The time to cecum = 3:02  minutes. The scope was then withdrawn in   9:56  minutes from the cecum and the procedure completed.  COMPLICATIONS:  None  ENDOSCOPIC IMPRESSION: 1) Diminutive polyp in the sigmoid colon - removed 2) Moderate diverticulosis throughout the colon 3) Otherwise normal colonoscopy 4) Internal hemorrhoids  RECOMMENDATIONS: 1) Follow up colonoscopy in 5  years  ______________________________ Wilhemina Bonito. Eda Keys, MD  CC:  Nelwyn Salisbury, MD;    The Patient  n. eSIGNED:   Wilhemina Bonito. Eda Keys at 03/28/2012 09:54 AM  [place], Rinaldo Cloud, [phone]

## 2012-03-28 NOTE — Progress Notes (Addendum)
See scanned crna intra procedure report.  Propofol per k rogers crna. ewm  Pt tolerated procedure well. ewm

## 2012-03-29 ENCOUNTER — Telehealth: Payer: Self-pay | Admitting: *Deleted

## 2012-03-29 NOTE — Telephone Encounter (Signed)
  Follow up Call-  Call back number 03/28/2012  Post procedure Call Back phone  # husband's cell [phone]  Permission to leave phone message Yes    Spoke with her husband Patient questions:  Do you have a fever, pain , or abdominal swelling? no Pain Score  0 *  Have you tolerated food without any problems? yes  Have you been able to return to your normal activities? yes  Do you have any questions about your discharge instructions: Diet   no Medications  no Follow up visit  no  Do you have questions or concerns about your Care? no  Actions: * If pain score is 4 or above: No action needed, pain <4.

## 2012-04-02 ENCOUNTER — Other Ambulatory Visit: Payer: Self-pay | Admitting: Family Medicine

## 2012-05-13 ENCOUNTER — Other Ambulatory Visit: Payer: Self-pay | Admitting: *Deleted

## 2012-05-13 ENCOUNTER — Other Ambulatory Visit: Payer: Self-pay | Admitting: Gynecology

## 2012-05-13 DIAGNOSIS — N63 Unspecified lump in unspecified breast: Secondary | ICD-10-CM

## 2012-05-16 ENCOUNTER — Other Ambulatory Visit (INDEPENDENT_AMBULATORY_CARE_PROVIDER_SITE_OTHER): Payer: 59

## 2012-05-16 DIAGNOSIS — Z0000 Encounter for general adult medical examination without abnormal findings: Secondary | ICD-10-CM

## 2012-05-16 LAB — BASIC METABOLIC PANEL
BUN: 21 mg/dL (ref 6–23)
CO2: 27 mEq/L (ref 19–32)
Calcium: 9.2 mg/dL (ref 8.4–10.5)
Chloride: 106 mEq/L (ref 96–112)
Creatinine, Ser: 0.7 mg/dL (ref 0.4–1.2)
GFR: 92.23 mL/min (ref >60.00)
Glucose, Bld: 102 mg/dL — ABNORMAL HIGH (ref 70–99)
Potassium: 4.2 mEq/L (ref 3.5–5.1)
Sodium: 142 mEq/L (ref 135–145)

## 2012-05-16 LAB — POCT URINALYSIS DIPSTICK
Bilirubin, UA: NEGATIVE
Blood, UA: NEGATIVE
Glucose, UA: NEGATIVE
Ketones, UA: NEGATIVE
Nitrite, UA: NEGATIVE
Protein, UA: NEGATIVE
Spec Grav, UA: 1.02
Urobilinogen, UA: 0.2
pH, UA: 6

## 2012-05-16 LAB — HEPATIC FUNCTION PANEL
ALT: 20 U/L (ref 0–35)
AST: 22 U/L (ref 0–37)
Albumin: 4.3 g/dL (ref 3.5–5.2)
Alkaline Phosphatase: 57 U/L (ref 39–117)
Bilirubin, Direct: 0 mg/dL (ref 0.0–0.3)
Total Bilirubin: 0.4 mg/dL (ref 0.3–1.2)
Total Protein: 7.3 g/dL (ref 6.0–8.3)

## 2012-05-16 LAB — LIPID PANEL
Cholesterol: 239 mg/dL — ABNORMAL HIGH (ref 0–200)
HDL: 49.8 mg/dL (ref >39.00)
Total CHOL/HDL Ratio: 5
Triglycerides: 100 mg/dL (ref 0.0–149.0)
VLDL: 20 mg/dL (ref 0.0–40.0)

## 2012-05-16 LAB — CBC WITH DIFFERENTIAL/PLATELET
Basophils Absolute: 0 10*3/uL (ref 0.0–0.1)
Basophils Relative: 0.3 % (ref 0.0–3.0)
Eosinophils Absolute: 0.1 10*3/uL (ref 0.0–0.7)
Eosinophils Relative: 1.7 % (ref 0.0–5.0)
HCT: 44.4 % (ref 36.0–46.0)
Hemoglobin: 14.5 g/dL (ref 12.0–15.0)
Lymphocytes Relative: 29.1 % (ref 12.0–46.0)
Lymphs Abs: 1.6 10*3/uL (ref 0.7–4.0)
MCHC: 32.7 g/dL (ref 30.0–36.0)
MCV: 91.6 fl (ref 78.0–100.0)
Monocytes Absolute: 0.6 10*3/uL (ref 0.1–1.0)
Monocytes Relative: 11.2 % (ref 3.0–12.0)
Neutro Abs: 3.1 10*3/uL (ref 1.4–7.7)
Neutrophils Relative %: 57.7 % (ref 43.0–77.0)
Platelets: 218 10*3/uL (ref 150.0–400.0)
RBC: 4.85 Mil/uL (ref 3.87–5.11)
RDW: 14.1 % (ref 11.5–14.6)
WBC: 5.4 10*3/uL (ref 4.5–10.5)

## 2012-05-16 LAB — LDL CHOLESTEROL, DIRECT: Direct LDL: 165.4 mg/dL

## 2012-05-16 LAB — TSH: TSH: 2.08 u[IU]/mL (ref 0.35–5.50)

## 2012-05-17 NOTE — Progress Notes (Signed)
Quick Note:  I spoke with pt ______ 

## 2012-05-28 ENCOUNTER — Encounter: Payer: Self-pay | Admitting: Family Medicine

## 2012-05-28 ENCOUNTER — Ambulatory Visit (INDEPENDENT_AMBULATORY_CARE_PROVIDER_SITE_OTHER): Payer: 59 | Admitting: Family Medicine

## 2012-05-28 VITALS — BP 124/80 | HR 78 | Temp 98.7°F | Ht 67.5 in | Wt 170.0 lb

## 2012-05-28 DIAGNOSIS — Z23 Encounter for immunization: Secondary | ICD-10-CM

## 2012-05-28 DIAGNOSIS — Z0000 Encounter for general adult medical examination without abnormal findings: Secondary | ICD-10-CM

## 2012-05-28 MED ORDER — PROPRANOLOL HCL 40 MG PO TABS: 40.0000 mg | ORAL_TABLET | Freq: Every day | ORAL | Status: DC

## 2012-05-28 MED ORDER — PREDNISONE 10 MG PO TABS: 10.0000 mg | ORAL_TABLET | ORAL | Status: DC | PRN

## 2012-05-28 MED ORDER — BETAMETHASONE VALERATE 0.1 % EX LOTN: TOPICAL_LOTION | Freq: Two times a day (BID) | CUTANEOUS | Status: DC | PRN

## 2012-05-28 MED ORDER — EZETIMIBE 10 MG PO TABS: 10.0000 mg | ORAL_TABLET | Freq: Every day | ORAL | Status: DC

## 2012-05-28 MED ORDER — FIORICET 50-325-40 MG PO TABS: 1.0000 | ORAL_TABLET | Freq: Four times a day (QID) | ORAL | Status: DC | PRN

## 2012-05-28 NOTE — Addendum Note (Signed)
Addended by: Duard Brady I on: 05/28/2012 12:26 PM   Modules accepted: Orders

## 2012-05-28 NOTE — Progress Notes (Signed)
  Subjective:    Patient ID: Victoria Mosley, female    DOB: 04-30-1951, 61 y.o.   MRN: [id number]  HPI 61 yr old female for a cpx. She feels well except for her chronic joint pains. She tries to exercise but this is difficult. She has managed to lose a little weight. She sees Dr. Dorothea Ogle GYN exams. Her HAs have become a bit more frequent lately.    Review of Systems  Constitutional: Negative.   HENT: Negative.   Eyes: Negative.   Respiratory: Negative.   Cardiovascular: Negative.   Gastrointestinal: Negative.   Genitourinary: Negative for dysuria, urgency, frequency, hematuria, flank pain, decreased urine volume, enuresis, difficulty urinating, pelvic pain and dyspareunia.  Musculoskeletal: Negative.   Skin: Negative.   Neurological: Negative.   Hematological: Negative.   Psychiatric/Behavioral: Negative.        Objective:   Physical Exam  Constitutional: She is oriented to person, place, and time. She appears well-developed and well-nourished. No distress.  HENT:  Head: Normocephalic and atraumatic.  Right Ear: External ear normal.  Left Ear: External ear normal.  Nose: Nose normal.  Mouth/Throat: Oropharynx is clear and moist. No oropharyngeal exudate.  Eyes: Conjunctivae and EOM are normal. Pupils are equal, round, and reactive to light. No scleral icterus.  Neck: Normal range of motion. Neck supple. No JVD present. No thyromegaly present.  Cardiovascular: Normal rate, regular rhythm, normal heart sounds and intact distal pulses.  Exam reveals no gallop and no friction rub.   No murmur heard.      EKG normal   Pulmonary/Chest: Effort normal and breath sounds normal. No respiratory distress. She has no wheezes. She has no rales. She exhibits no tenderness.  Abdominal: Soft. Bowel sounds are normal. She exhibits no distension and no mass. There is no tenderness. There is no rebound and no guarding.  Musculoskeletal: Normal range of motion. She exhibits no edema and no  tenderness.  Lymphadenopathy:    She has no cervical adenopathy.  Neurological: She is alert and oriented to person, place, and time. She has normal reflexes. No cranial nerve deficit. She exhibits normal muscle tone. Coordination normal.  Skin: Skin is warm and dry. No rash noted. No erythema.  Psychiatric: She has a normal mood and affect. Her behavior is normal. Judgment and thought content normal.          Assessment & Plan:  Well exam. Her LDL is still too high despite a strict diet and taking fish oil capsules and flax seeds, so she will start on Zetia 10 mg a day. Recheck labs in 90 days. We will switch from Nadolol to Propranolol to prevent HAs.

## 2012-05-28 NOTE — Addendum Note (Signed)
Addended by: Aniceto Boss A on: 05/28/2012 12:31 PM   Modules accepted: Orders

## 2012-07-08 ENCOUNTER — Encounter (HOSPITAL_COMMUNITY): Payer: Self-pay | Admitting: Certified Registered"

## 2012-07-08 ENCOUNTER — Encounter (HOSPITAL_COMMUNITY)
Admission: EM | Disposition: A | Discharge status: Rehabilitation Facility | Payer: Self-pay | Source: Home / Self Care | Attending: Neurological Surgery

## 2012-07-08 ENCOUNTER — Encounter (HOSPITAL_COMMUNITY): Payer: Self-pay

## 2012-07-08 ENCOUNTER — Inpatient Hospital Stay (HOSPITAL_COMMUNITY)
Admission: EM | Admit: 2012-07-08 | Discharge: 2012-07-11 | DRG: 909 | Disposition: A | Discharge status: Rehabilitation Facility | Payer: 59 | Attending: Neurological Surgery | Admitting: Neurological Surgery

## 2012-07-08 ENCOUNTER — Emergency Department (HOSPITAL_COMMUNITY): Payer: 59 | Admitting: Certified Registered"

## 2012-07-08 ENCOUNTER — Emergency Department (HOSPITAL_COMMUNITY): Payer: 59

## 2012-07-08 DIAGNOSIS — IMO0002 Reserved for concepts with insufficient information to code with codable children: Principal | ICD-10-CM | POA: Diagnosis present

## 2012-07-08 DIAGNOSIS — Z87891 Personal history of nicotine dependence: Secondary | ICD-10-CM

## 2012-07-08 DIAGNOSIS — Y849 Medical procedure, unspecified as the cause of abnormal reaction of the patient, or of later complication, without mention of misadventure at the time of the procedure: Secondary | ICD-10-CM | POA: Diagnosis present

## 2012-07-08 DIAGNOSIS — Z8249 Family history of ischemic heart disease and other diseases of the circulatory system: Secondary | ICD-10-CM

## 2012-07-08 DIAGNOSIS — Z833 Family history of diabetes mellitus: Secondary | ICD-10-CM

## 2012-07-08 DIAGNOSIS — K219 Gastro-esophageal reflux disease without esophagitis: Secondary | ICD-10-CM | POA: Diagnosis present

## 2012-07-08 DIAGNOSIS — N319 Neuromuscular dysfunction of bladder, unspecified: Secondary | ICD-10-CM | POA: Diagnosis present

## 2012-07-08 DIAGNOSIS — S064XAA Epidural hemorrhage with loss of consciousness status unknown, initial encounter: Secondary | ICD-10-CM

## 2012-07-08 DIAGNOSIS — Y92009 Unspecified place in unspecified non-institutional (private) residence as the place of occurrence of the external cause: Secondary | ICD-10-CM

## 2012-07-08 DIAGNOSIS — S064X9A Epidural hemorrhage with loss of consciousness of unspecified duration, initial encounter: Secondary | ICD-10-CM

## 2012-07-08 DIAGNOSIS — K589 Irritable bowel syndrome without diarrhea: Secondary | ICD-10-CM | POA: Diagnosis present

## 2012-07-08 HISTORY — PX: HEMATOMA EVACUATION: SHX5118

## 2012-07-08 HISTORY — PX: POSTERIOR CERVICAL LAMINECTOMY: SHX2248

## 2012-07-08 LAB — CBC WITH DIFFERENTIAL/PLATELET
Basophils Absolute: 0 10*3/uL (ref 0.0–0.1)
Basophils Relative: 0 % (ref 0–1)
Eosinophils Absolute: 0.1 10*3/uL (ref 0.0–0.7)
Eosinophils Relative: 1 % (ref 0–5)
HCT: 41.5 % (ref 36.0–46.0)
Hemoglobin: 14.2 g/dL (ref 12.0–15.0)
Lymphocytes Relative: 17 % (ref 12–46)
Lymphs Abs: 1.6 10*3/uL (ref 0.7–4.0)
MCH: 30.7 pg (ref 26.0–34.0)
MCHC: 34.2 g/dL (ref 30.0–36.0)
MCV: 89.6 fL (ref 78.0–100.0)
Monocytes Absolute: 0.7 10*3/uL (ref 0.1–1.0)
Monocytes Relative: 8 % (ref 3–12)
Neutro Abs: 7.2 10*3/uL (ref 1.7–7.7)
Neutrophils Relative %: 75 % (ref 43–77)
Platelets: 206 10*3/uL (ref 150–400)
RBC: 4.63 MIL/uL (ref 3.87–5.11)
RDW: 12.7 % (ref 11.5–15.5)
WBC: 9.6 10*3/uL (ref 4.0–10.5)

## 2012-07-08 LAB — URINALYSIS, ROUTINE W REFLEX MICROSCOPIC
Bilirubin Urine: NEGATIVE
Glucose, UA: NEGATIVE mg/dL
Ketones, ur: NEGATIVE mg/dL
Nitrite: NEGATIVE
Protein, ur: NEGATIVE mg/dL
Specific Gravity, Urine: 1.014 (ref 1.005–1.030)
Urobilinogen, UA: 0.2 mg/dL (ref 0.0–1.0)
pH: 7.5 (ref 5.0–8.0)

## 2012-07-08 LAB — COMPREHENSIVE METABOLIC PANEL
ALT: 16 U/L (ref 0–35)
AST: 25 U/L (ref 0–37)
Albumin: 4.4 g/dL (ref 3.5–5.2)
Alkaline Phosphatase: 75 U/L (ref 39–117)
BUN: 21 mg/dL (ref 6–23)
CO2: 27 mEq/L (ref 19–32)
Calcium: 10.2 mg/dL (ref 8.4–10.5)
Chloride: 98 mEq/L (ref 96–112)
Creatinine, Ser: 0.74 mg/dL (ref 0.50–1.10)
GFR calc Af Amer: >90 mL/min (ref >90)
GFR calc non Af Amer: >90 mL/min (ref >90)
Glucose, Bld: 114 mg/dL — ABNORMAL HIGH (ref 70–99)
Potassium: 3.7 mEq/L (ref 3.5–5.1)
Sodium: 137 mEq/L (ref 135–145)
Total Bilirubin: 0.3 mg/dL (ref 0.3–1.2)
Total Protein: 7.5 g/dL (ref 6.0–8.3)

## 2012-07-08 LAB — URINE MICROSCOPIC-ADD ON

## 2012-07-08 LAB — PROTIME-INR
INR: 0.99 (ref 0.00–1.49)
Prothrombin Time: 13.3 seconds (ref 11.6–15.2)

## 2012-07-08 LAB — TROPONIN I: Troponin I: <0.3 ng/mL (ref <0.30)

## 2012-07-08 SURGERY — POSTERIOR CERVICAL LAMINECTOMY
Anesthesia: General | Site: Spine Cervical | Wound class: Clean

## 2012-07-08 MED ORDER — LIDOCAINE HCL 4 % MT SOLN
OROMUCOSAL | Status: DC | PRN
  Administered 2012-07-08: 4 mL via TOPICAL

## 2012-07-08 MED ORDER — GADOBENATE DIMEGLUMINE 529 MG/ML IV SOLN
15.0000 mL | Freq: Once | INTRAVENOUS | Status: AC | PRN
  Administered 2012-07-08: 15 mL via INTRAVENOUS

## 2012-07-08 MED ORDER — DEXTROSE 5 % IV SOLN
1.0000 g | Freq: Once | INTRAVENOUS | Status: AC
  Administered 2012-07-08: 1 g via INTRAVENOUS
  Filled 2012-07-08: qty 10

## 2012-07-08 MED ORDER — MIDAZOLAM HCL 5 MG/5ML IJ SOLN
INTRAMUSCULAR | Status: DC | PRN
  Administered 2012-07-08: 2 mg via INTRAVENOUS

## 2012-07-08 MED ORDER — SODIUM CHLORIDE 0.9 % IV SOLN
INTRAVENOUS | Status: DC | PRN
  Administered 2012-07-08: 23:00:00 via INTRAVENOUS

## 2012-07-08 MED ORDER — PROPOFOL 10 MG/ML IV BOLUS
INTRAVENOUS | Status: DC | PRN
  Administered 2012-07-08: 170 mg via INTRAVENOUS

## 2012-07-08 MED ORDER — MUPIROCIN 2 % EX OINT
TOPICAL_OINTMENT | Freq: Two times a day (BID) | CUTANEOUS | Status: DC
  Administered 2012-07-09 – 2012-07-11 (×5): via NASAL
  Filled 2012-07-08: qty 22

## 2012-07-08 MED ORDER — EPHEDRINE SULFATE 50 MG/ML IJ SOLN
INTRAMUSCULAR | Status: DC | PRN
  Administered 2012-07-08 – 2012-07-09 (×2): 10 mg via INTRAVENOUS

## 2012-07-08 MED ORDER — ROCURONIUM BROMIDE 100 MG/10ML IV SOLN
INTRAVENOUS | Status: DC | PRN
  Administered 2012-07-08: 40 mg via INTRAVENOUS

## 2012-07-08 MED ORDER — SUFENTANIL CITRATE 50 MCG/ML IV SOLN
INTRAVENOUS | Status: DC | PRN
  Administered 2012-07-08: 20 ug via INTRAVENOUS

## 2012-07-08 MED ORDER — LIDOCAINE HCL (CARDIAC) 20 MG/ML IV SOLN
INTRAVENOUS | Status: DC | PRN
  Administered 2012-07-08: 100 mg via INTRAVENOUS

## 2012-07-08 SURGICAL SUPPLY — 67 items
BAG DECANTER FOR FLEXI CONT (MISCELLANEOUS) ×2 IMPLANT
BENZOIN TINCTURE PRP APPL 2/3 (GAUZE/BANDAGES/DRESSINGS) IMPLANT
BIT DRILL NEURO 2X3.1 SFT TUCH (MISCELLANEOUS) ×1 IMPLANT
BLADE SURG 11 STRL SS (BLADE) IMPLANT
BLADE SURG ROTATE 9660 (MISCELLANEOUS) IMPLANT
BRUSH SCRUB EZ 1% IODOPHOR (MISCELLANEOUS) IMPLANT
CANISTER SUCTION 2500CC (MISCELLANEOUS) ×2 IMPLANT
CLOTH BEACON ORANGE TIMEOUT ST (SAFETY) ×2 IMPLANT
CONT SPEC 4OZ CLIKSEAL STRL BL (MISCELLANEOUS) ×2 IMPLANT
COVER MAYO STAND STRL (DRAPES) IMPLANT
DECANTER SPIKE VIAL GLASS SM (MISCELLANEOUS) ×2 IMPLANT
DERMABOND ADVANCED (GAUZE/BANDAGES/DRESSINGS)
DERMABOND ADVANCED .7 DNX12 (GAUZE/BANDAGES/DRESSINGS) IMPLANT
DRAPE LAPAROTOMY 100X72 PEDS (DRAPES) ×2 IMPLANT
DRAPE POUCH INSTRU U-SHP 10X18 (DRAPES) ×2 IMPLANT
DRILL NEURO 2X3.1 SOFT TOUCH (MISCELLANEOUS) ×2
DURAPREP 26ML APPLICATOR (WOUND CARE) ×2 IMPLANT
ELECT BLADE 6.5 EXT (BLADE) IMPLANT
ELECT REM PT RETURN 9FT ADLT (ELECTROSURGICAL) ×2
ELECTRODE REM PT RTRN 9FT ADLT (ELECTROSURGICAL) ×1 IMPLANT
EVACUATOR 1/8 PVC DRAIN (DRAIN) ×2 IMPLANT
GAUZE SPONGE 4X4 16PLY XRAY LF (GAUZE/BANDAGES/DRESSINGS) ×2 IMPLANT
GLOVE BIO SURGEON STRL SZ 6.5 (GLOVE) ×2 IMPLANT
GLOVE BIO SURGEON STRL SZ7.5 (GLOVE) IMPLANT
GLOVE BIOGEL PI IND STRL 6.5 (GLOVE) ×1 IMPLANT
GLOVE BIOGEL PI IND STRL 7.0 (GLOVE) ×1 IMPLANT
GLOVE BIOGEL PI IND STRL 7.5 (GLOVE) IMPLANT
GLOVE BIOGEL PI IND STRL 8.5 (GLOVE) ×1 IMPLANT
GLOVE BIOGEL PI INDICATOR 6.5 (GLOVE) ×1
GLOVE BIOGEL PI INDICATOR 7.0 (GLOVE) ×1
GLOVE BIOGEL PI INDICATOR 7.5 (GLOVE)
GLOVE BIOGEL PI INDICATOR 8.5 (GLOVE) ×1
GLOVE ECLIPSE 8.5 STRL (GLOVE) ×2 IMPLANT
GLOVE EXAM NITRILE LRG STRL (GLOVE) IMPLANT
GLOVE EXAM NITRILE MD LF STRL (GLOVE) IMPLANT
GLOVE EXAM NITRILE XL STR (GLOVE) IMPLANT
GLOVE EXAM NITRILE XS STR PU (GLOVE) IMPLANT
GLOVE SS BIOGEL STRL SZ 6.5 (GLOVE) ×1 IMPLANT
GLOVE SUPERSENSE BIOGEL SZ 6.5 (GLOVE) ×1
GOWN BRE IMP SLV AUR LG STRL (GOWN DISPOSABLE) ×4 IMPLANT
GOWN BRE IMP SLV AUR XL STRL (GOWN DISPOSABLE) ×2 IMPLANT
GOWN STRL REIN 2XL LVL4 (GOWN DISPOSABLE) IMPLANT
HEMOSTAT SURGICEL 2X14 (HEMOSTASIS) IMPLANT
KIT BASIN OR (CUSTOM PROCEDURE TRAY) ×2 IMPLANT
KIT ROOM TURNOVER OR (KITS) ×2 IMPLANT
NEEDLE HYPO 18GX1.5 BLUNT FILL (NEEDLE) IMPLANT
NEEDLE HYPO 22GX1.5 SAFETY (NEEDLE) ×2 IMPLANT
NS IRRIG 1000ML POUR BTL (IV SOLUTION) ×2 IMPLANT
PACK LAMINECTOMY NEURO (CUSTOM PROCEDURE TRAY) ×2 IMPLANT
PAD ARMBOARD 7.5X6 YLW CONV (MISCELLANEOUS) ×10 IMPLANT
SPONGE GAUZE 4X4 12PLY (GAUZE/BANDAGES/DRESSINGS) ×2 IMPLANT
SPONGE LAP 4X18 X RAY DECT (DISPOSABLE) IMPLANT
SPONGE SURGIFOAM ABS GEL SZ50 (HEMOSTASIS) ×2 IMPLANT
STAPLER SKIN PROX WIDE 3.9 (STAPLE) ×2 IMPLANT
STRIP CLOSURE SKIN 1/2X4 (GAUZE/BANDAGES/DRESSINGS) IMPLANT
SUT ETHILON 3 0 FSL (SUTURE) ×2 IMPLANT
SUT VIC AB 1 CT1 18XBRD ANBCTR (SUTURE) ×1 IMPLANT
SUT VIC AB 1 CT1 8-18 (SUTURE) ×1
SUT VIC AB 2-0 CP2 18 (SUTURE) ×2 IMPLANT
SUT VIC AB 3-0 SH 8-18 (SUTURE) ×4 IMPLANT
SYR 20ML ECCENTRIC (SYRINGE) ×2 IMPLANT
SYR 3ML LL SCALE MARK (SYRINGE) IMPLANT
TAPE CLOTH SURG 4X10 WHT LF (GAUZE/BANDAGES/DRESSINGS) ×2 IMPLANT
TOWEL OR 17X24 6PK STRL BLUE (TOWEL DISPOSABLE) ×2 IMPLANT
TOWEL OR 17X26 10 PK STRL BLUE (TOWEL DISPOSABLE) ×2 IMPLANT
TRAY FOLEY CATH 14FRSI W/METER (CATHETERS) IMPLANT
WATER STERILE IRR 1000ML POUR (IV SOLUTION) ×2 IMPLANT

## 2012-07-08 NOTE — ED Notes (Signed)
Pt. Reports getting a injection between c5 and c6 for neck pain. Went home and is now having right leg weakness/numbness and spasms. Reports midsternal chest pain. Pt. Alert and oriented x4, anxious.

## 2012-07-08 NOTE — ED Provider Notes (Signed)
History     CSN: [id number]  Arrival date & time 07/08/12  2007   First MD Initiated Contact with Patient 07/08/12 2015      Chief Complaint  Patient presents with  . Extremity Weakness    (Consider location/radiation/quality/duration/timing/severity/associated sxs/prior treatment) HPI Comments: Patient complains of right leg weakness and spasm since receiving an epidural C-spine injection around 3 PM. She's gotten C-spine injections previously by Dr. Alvester Morin for cervical radiculopathy. After the injection today, she did not feel right and developed chest pain and upper back pain which have much improved. After returning home patient noticed spasms in her right leg and difficulty moving it. She denies any fevers, vomiting, bowel or bladder incontinence. She called Dr. August Saucer and was referred to the ED for an MRI of her C-spine. She denies any previous cardiac history.  The history is provided by the patient and the EMS personnel.    Past Medical History  Diagnosis Date  . Menopause     sees Dr. Audie Box  . 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 Migraines   . GERD (gastroesophageal reflux disease)   . Allergy   . IBS (irritable bowel syndrome)   . Arthritis   . Hyperlipidemia   . Diverticulitis   . Kidney stones   . Abnormal EKG 2012    stress test normal  . UTI (urinary tract infection)     started atb on 03-24-12  . Cancer   . Bilateral knee pain     sees Dr. Lequita Halt     Past Surgical History  Procedure Date  . Melanoma excision 2007    left upper arm per dr. Lonni Fix  . Foot surgery 1987    per Dr. Chaney Malling  . Nasal septum surgery 1992    per Dr. Berna Bue  . Lithotripsy 2007  . Lumbar laminectomy 1993    1993, at L5 per Dr. Roxan Hockey  . Colonoscopy 03-28-12    per Dr. Marina Goodell, polyp,  repeat in 5 yrs  . Cesarean section   . Tubal ligation   . Kidney stone surgery 2005    per Dr. Wanda Plump  . Total hip arthroplasty 2010    on right per Dr. Lequita Halt     Family History  Problem Relation Age of Onset  . Heart disease Mother   . Hypertension Mother   . Diabetes Mother   . Depression Father   . Hypertension Father   . Leukemia Father   . Hypertension Sister   . Heart disease Sister   . Cancer Sister     bladder  . Stomach cancer Paternal Grandmother   . Colon cancer Neg Hx   . Esophageal cancer Neg Hx   . Rectal cancer Neg Hx     History  Substance Use Topics  . Smoking status: Former Games developer  . Smokeless tobacco: Never Used  . Alcohol Use: No    OB History    Grav Para Term Preterm Abortions TAB SAB Ect Mult Living   1 1        1       Review of Systems  Constitutional: Positive for activity change. Negative for fever and appetite change.  HENT: Negative for congestion and rhinorrhea.   Eyes: Negative for visual disturbance.  Respiratory: Positive for chest tightness.   Cardiovascular: Positive for chest pain.  Gastrointestinal: Negative for abdominal pain.  Genitourinary: Negative for dysuria and hematuria.  Musculoskeletal: Positive for back pain and extremity weakness.  Skin: Negative for rash.  Neurological: Positive for weakness. Negative for dizziness and headaches.    Allergies  Pravastatin sodium; Biaxin; Ciprofloxacin; and Clarithromycin  Home Medications   Current Outpatient Rx  Name Route Sig Dispense Refill  . ASPIRIN 81 MG PO TABS Oral Take 81 mg by mouth daily.     . ASPIRIN-ACETAMINOPHEN-CAFFEINE 250-250-65 MG PO TABS Oral Take 1 tablet by mouth every 6 (six) hours as needed. For migraine    . VITAMIN B COMPLEX PO Oral Take 1 each by mouth daily.      Marland Kitchen BUTALBITAL-APAP-CAFFEINE 50-325-40 MG PO TABS Oral Take 1 tablet by mouth 2 (two) times daily as needed. For migraine    . CALCIUM-VITAMIN D 600-200 MG-UNIT PO TABS Oral Take 1 tablet by mouth 2 (two) times daily.     Marland Kitchen DIMENHYDRINATE 50 MG PO TABS Oral Take 50 mg by mouth every 8 (eight) hours as needed. For nausea    . DIPHENHYDRAMINE HCL 25  MG PO CAPS Oral Take 25 mg by mouth at bedtime.      . ESTROPIPATE 0.75 MG PO TABS Oral Take 1 tablet (0.75 mg total) by mouth daily. 90 tablet 4  . FLAX PO OIL Oral Take 1 each by mouth 2 (two) times daily.     . IBUPROFEN 800 MG PO TABS Oral Take 800 mg by mouth every 8 (eight) hours as needed. For pain    . LORATADINE 10 MG PO TABS Oral Take 10 mg by mouth daily. For allergies     . MEDROXYPROGESTERONE ACETATE 2.5 MG PO TABS Oral Take 1 tablet (2.5 mg total) by mouth daily. 90 tablet 4  . ONE-A-DAY WOMENS 50 PLUS PO Oral Take 1 each by mouth daily.      Marland Kitchen NAPROXEN SODIUM 220 MG PO TABS Oral Take 220 mg by mouth every morning. Arthritis      . OMEPRAZOLE 20 MG PO CPDR Oral Take 20 mg by mouth daily.      Marland Kitchen OVER THE COUNTER MEDICATION Oral Take 1 capsule by mouth 2 (two) times daily. Turmeric    . POTASSIUM GLUCONATE 595 MG PO CAPS Oral Take 1 each by mouth daily.      Marland Kitchen PREDNISONE 10 MG PO TABS Oral Take 1 tablet (10 mg total) by mouth as needed. For mouth ulcers 60 tablet 5  . PROPRANOLOL HCL 40 MG PO TABS Oral Take 1 tablet (40 mg total) by mouth daily. 90 tablet 3  . VITAMIN C 500 MG PO TABS Oral Take 1,000 mg by mouth daily.       BP 126/67  Pulse 64  Temp 97.9 F (36.6 C) (Oral)  Resp 24  SpO2 94%  Physical Exam  Constitutional: She is oriented to person, place, and time. She appears well-developed and well-nourished. She appears distressed.       Uncomfortable  HENT:  Head: Normocephalic and atraumatic.  Mouth/Throat: Oropharynx is clear and moist. No oropharyngeal exudate.  Eyes: Conjunctivae and EOM are normal. Pupils are equal, round, and reactive to light.  Neck: Normal range of motion. Neck supple.  Cardiovascular: Normal rate, regular rhythm and normal heart sounds.   No murmur heard.      +2 DP, femoral, PT pulses  Pulmonary/Chest: Breath sounds normal. No respiratory distress. She exhibits tenderness.  Abdominal: Soft. There is no tenderness. There is no rebound  and no guarding.  Genitourinary:       Normal rectal tone  Musculoskeletal: She exhibits tenderness. She  exhibits no edema.       Right lower extremity spasm. Unable to lift right leg off bed. Cannot hold up against gravity. +2 DP and PT pulses bilaterally, equal grip she is bilaterally, no facial asymmetry  Neurological: She is alert and oriented to person, place, and time. No cranial nerve deficit.  Skin: Skin is warm.    ED Course  Procedures (including critical care time)  Labs Reviewed  COMPREHENSIVE METABOLIC PANEL - Abnormal; Notable for the following:    Glucose, Bld 114 (*)     All other components within normal limits  URINALYSIS, ROUTINE W REFLEX MICROSCOPIC - Abnormal; Notable for the following:    APPearance CLOUDY (*)     Hgb urine dipstick MODERATE (*)     Leukocytes, UA LARGE (*)     All other components within normal limits  URINE MICROSCOPIC-ADD ON - Abnormal; Notable for the following:    Squamous Epithelial / LPF FEW (*)     Bacteria, UA MANY (*)     All other components within normal limits  CBC WITH DIFFERENTIAL  TROPONIN I  PROTIME-INR  URINALYSIS, WITH MICROSCOPIC   Mr Cervical Spine W Wo Contrast  07/08/2012  *RADIOLOGY REPORT*  Clinical Data: Numbness after spinal injection C6 level.  MRI CERVICAL SPINE WITHOUT AND WITH CONTRAST  Technique:  Multiplanar and multiecho pulse sequences of the cervical spine, to include the craniocervical junction and cervicothoracic junction, were obtained according to standard protocol without and with intravenous contrast.  Contrast: 15mL MULTIHANCE GADOBENATE DIMEGLUMINE 529 MG/ML IV SOLN  Comparison: 09/29/2009  Findings: Large epidural hematoma.  Right posterior lateral loculated component  extends from the C6-7 disc space level to the mid T2 level spanning over 3.9 cm with maximal transverse dimension of 1.2 x 0.7 cm compressing the cord anteriorly and to the left. Within the compressed cord slight increased signal  suggestive of edema on STIR sequence.  Additionally, broad-based epidural hematoma lateral and posterior position (spanning between the neural foramen) extends from the C2 to the T3-T4 level Maximal thickness is 4.5 mm.  Cervical spondylotic changes including:  C2-3:  Small central protrusion. Facet joint degenerative changes greater on the right.  C3-4:  Tiny/small central protrusion. Uncinate bony overgrowth and mild bilateral foraminal narrowing.  C4-5:  Bulge/small broad-based protrusion with mild cord contact. Mild bilateral foraminal narrowing.  C5-6:  Broad-based disc osteophyte complex.  Slightly greater extension to the left. Mild cord flattening.  Moderate right-sided and  mild left-sided foraminal narrowing.  C6-7:  Small broad-based disc osteophyte complex greater to the right.  Mild cord contact/flattening.  Moderate foraminal narrowing greater on the right.  C7-T1:  Bulge.  T1-2:  Facet joint degenerative changes and bulge.  T2-3:  Bulge.  IMPRESSION:  Large epidural hematoma.  Right posterior lateral loculated component  extends from the C6-7 disc space level to the mid T2 level spanning over 3.9 cm with maximal transverse dimension of 1.2 x 0.7 cm compressing the cord anteriorly and to the left.  Within the compressed cord slight increased signal suggestive of edema on STIR sequence.  Additionally, broad-based epidural hematoma lateral and posterior position (spanning between the neural foramen) extends from the C2 to the T3-T4 level.  Maximal thickness is 4.5 mm.  Neurosurgical consultation recommended.  Baseline cervical spondylotic changes as noted above.  Critical Value/emergent results were called by telephone at the time of interpretation on 07/08/2012 at 9:45 p.m. to Dr. Manus Gunning, who verbally acknowledged these results.   Original Report  Authenticated By: Fuller Canada, M.D.    Dg Chest Portable 1 View  07/08/2012  *RADIOLOGY REPORT*  Clinical Data: Right leg weakness, hypertension   PORTABLE CHEST - 1 VIEW  Comparison: Chest x-ray of 11/23/2003  Findings: The lungs are clear.  Mediastinal contours appear stable. The heart is mildly enlarged and stable.  No bony abnormality is seen.  IMPRESSION: Stable chest x-ray.  No active lung disease.   Original Report Authenticated By: Juline Patch, M.D.      1. Epidural hematoma       MDM  Right leg weakness and spasm after receiving epidural injection this afternoon. Chest pain earlier now resolved. Some persistent chest wall tenderness to palpation.  Called by radiology Dr. Constance Goltz regarding cervical epidural hematoma.  D/w Dr. Danielle Dess of neurosurgery.  Dr. Danielle Dess planning to take patient to OR for decompression.  Patient and family in agreement.     Date: 07/08/2012  Rate: 70  Rhythm: normal sinus rhythm  QRS Axis: normal  Intervals: normal  ST/T Wave abnormalities: normal  Conduction Disutrbances:none  Narrative Interpretation:   Old EKG Reviewed: unchanged  CRITICAL CARE Performed by: Glynn Octave   Total critical care time: 30  Critical care time was exclusive of separately billable procedures and treating other patients.  Critical care was necessary to treat or prevent imminent or life-threatening deterioration.  Critical care was time spent personally by me on the following activities: development of treatment plan with patient and/or surrogate as well as nursing, discussions with consultants, evaluation of patient's response to treatment, examination of patient, obtaining history from patient or surrogate, ordering and performing treatments and interventions, ordering and review of laboratory studies, ordering and review of radiographic studies, pulse oximetry and re-evaluation of patient's condition.   Glynn Octave, MD 07/08/12 [phone]

## 2012-07-08 NOTE — H&P (Signed)
Victoria Mosley is an 61 y.o. female.   Chief Complaint: Pain and weakness in right leg status post cervical epidural hematoma HPI: Patient is a 61 year old right-handed white female who undergone a cervical epidural hematoma this afternoon around 3:00 by Dr. Alvester Morin. The noted that she had some pain between the shoulder blades and in the base of the neck prior to her discharge but approximately a half hour to an hour after her discharge she noted the onset of weakness of the right leg she also had increasing neck and shoulder pain she also becomes nauseated she return to the emergency room were and MRI of the cervical spine was performed. This demonstrates that the patient hasn't epidural hematoma at the level of C7-T1 compressing and effacing the right side of her spinal cord. She is now being prepared for the operating room to undergo surgical decompression.  Past Medical History  Diagnosis Date  . Menopause     sees Dr. Audie Box  . 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 Migraines   . GERD (gastroesophageal reflux disease)   . Allergy   . IBS (irritable bowel syndrome)   . Arthritis   . Hyperlipidemia   . Diverticulitis   . Kidney stones   . Abnormal EKG 2012    stress test normal  . UTI (urinary tract infection)     started atb on 03-24-12  . Cancer   . Bilateral knee pain     sees Dr. Lequita Halt     Past Surgical History  Procedure Date  . Melanoma excision 2007    left upper arm per dr. Lonni Fix  . Foot surgery 1987    per Dr. Chaney Malling  . Nasal septum surgery 1992    per Dr. Berna Bue  . Lithotripsy 2007  . Lumbar laminectomy 1993    1993, at L5 per Dr. Roxan Hockey  . Colonoscopy 03-28-12    per Dr. Marina Goodell, polyp,  repeat in 5 yrs  . Cesarean section   . Tubal ligation   . Kidney stone surgery 2005    per Dr. Wanda Plump  . Total hip arthroplasty 2010    on right per Dr. Lequita Halt    Family History  Problem Relation Age of Onset  . Heart disease Mother   .  Hypertension Mother   . Diabetes Mother   . Depression Father   . Hypertension Father   . Leukemia Father   . Hypertension Sister   . Heart disease Sister   . Cancer Sister     bladder  . Stomach cancer Paternal Grandmother   . Colon cancer Neg Hx   . Esophageal cancer Neg Hx   . Rectal cancer Neg Hx    Social History:  reports that she has quit smoking. She has never used smokeless tobacco. She reports that she does not drink alcohol or use illicit drugs.  Allergies:  Allergies  Allergen Reactions  . Pravastatin Sodium (Pravachol)     Joint pain(severe)  . Biaxin (Clarithromycin) Nausea And Vomiting  . Ciprofloxacin Nausea Only  . Clarithromycin Nausea Only     (Not in a hospital admission)  Results for orders placed during the hospital encounter of 07/08/12 (from the past 48 hour(s))  CBC WITH DIFFERENTIAL     Status: Normal   Collection Time   07/08/12  8:37 PM      Component Value Range Comment   WBC 9.6  4.0 - 10.5 K/uL    RBC  4.63  3.87 - 5.11 MIL/uL    Hemoglobin 14.2  12.0 - 15.0 g/dL    HCT 45.4  09.8 - 11.9 %    MCV 89.6  78.0 - 100.0 fL    MCH 30.7  26.0 - 34.0 pg    MCHC 34.2  30.0 - 36.0 g/dL    RDW 14.7  82.9 - 56.2 %    Platelets 206  150 - 400 K/uL    Neutrophils Relative 75  43 - 77 %    Neutro Abs 7.2  1.7 - 7.7 K/uL    Lymphocytes Relative 17  12 - 46 %    Lymphs Abs 1.6  0.7 - 4.0 K/uL    Monocytes Relative 8  3 - 12 %    Monocytes Absolute 0.7  0.1 - 1.0 K/uL    Eosinophils Relative 1  0 - 5 %    Eosinophils Absolute 0.1  0.0 - 0.7 K/uL    Basophils Relative 0  0 - 1 %    Basophils Absolute 0.0  0.0 - 0.1 K/uL   COMPREHENSIVE METABOLIC PANEL     Status: Abnormal   Collection Time   07/08/12  8:37 PM      Component Value Range Comment   Sodium 137  135 - 145 mEq/L    Potassium 3.7  3.5 - 5.1 mEq/L    Chloride 98  96 - 112 mEq/L    CO2 27  19 - 32 mEq/L    Glucose, Bld 114 (*) 70 - 99 mg/dL    BUN 21  6 - 23 mg/dL    Creatinine, Ser 1.30   0.50 - 1.10 mg/dL    Calcium 86.5  8.4 - 10.5 mg/dL    Total Protein 7.5  6.0 - 8.3 g/dL    Albumin 4.4  3.5 - 5.2 g/dL    AST 25  0 - 37 U/L    ALT 16  0 - 35 U/L    Alkaline Phosphatase 75  39 - 117 U/L    Total Bilirubin 0.3  0.3 - 1.2 mg/dL    GFR calc non Af Amer >90  >90 mL/min    GFR calc Af Amer >90  >90 mL/min   TROPONIN I     Status: Normal   Collection Time   07/08/12  8:37 PM      Component Value Range Comment   Troponin I <0.30  <0.30 ng/mL   URINALYSIS, ROUTINE W REFLEX MICROSCOPIC     Status: Abnormal   Collection Time   07/08/12  8:40 PM      Component Value Range Comment   Color, Urine YELLOW  YELLOW    APPearance CLOUDY (*) CLEAR    Specific Gravity, Urine 1.014  1.005 - 1.030    pH 7.5  5.0 - 8.0    Glucose, UA NEGATIVE  NEGATIVE mg/dL    Hgb urine dipstick MODERATE (*) NEGATIVE    Bilirubin Urine NEGATIVE  NEGATIVE    Ketones, ur NEGATIVE  NEGATIVE mg/dL    Protein, ur NEGATIVE  NEGATIVE mg/dL    Urobilinogen, UA 0.2  0.0 - 1.0 mg/dL    Nitrite NEGATIVE  NEGATIVE    Leukocytes, UA LARGE (*) NEGATIVE   URINE MICROSCOPIC-ADD ON     Status: Abnormal   Collection Time   07/08/12  8:40 PM      Component Value Range Comment   Squamous Epithelial / LPF FEW (*) RARE    WBC,  UA 11-20  <3 WBC/hpf    RBC / HPF 0-2  <3 RBC/hpf    Bacteria, UA MANY (*) RARE    Urine-Other AMORPHOUS URATES/PHOSPHATES      Mr Cervical Spine W Wo Contrast  07/08/2012  *RADIOLOGY REPORT*  Clinical Data: Numbness after spinal injection C6 level.  MRI CERVICAL SPINE WITHOUT AND WITH CONTRAST  Technique:  Multiplanar and multiecho pulse sequences of the cervical spine, to include the craniocervical junction and cervicothoracic junction, were obtained according to standard protocol without and with intravenous contrast.  Contrast: 15mL MULTIHANCE GADOBENATE DIMEGLUMINE 529 MG/ML IV SOLN  Comparison: 09/29/2009  Findings: Large epidural hematoma.  Right posterior lateral loculated component  extends  from the C6-7 disc space level to the mid T2 level spanning over 3.9 cm with maximal transverse dimension of 1.2 x 0.7 cm compressing the cord anteriorly and to the left. Within the compressed cord slight increased signal suggestive of edema on STIR sequence.  Additionally, broad-based epidural hematoma lateral and posterior position (spanning between the neural foramen) extends from the C2 to the T3-T4 level Maximal thickness is 4.5 mm.  Cervical spondylotic changes including:  C2-3:  Small central protrusion. Facet joint degenerative changes greater on the right.  C3-4:  Tiny/small central protrusion. Uncinate bony overgrowth and mild bilateral foraminal narrowing.  C4-5:  Bulge/small broad-based protrusion with mild cord contact. Mild bilateral foraminal narrowing.  C5-6:  Broad-based disc osteophyte complex.  Slightly greater extension to the left. Mild cord flattening.  Moderate right-sided and  mild left-sided foraminal narrowing.  C6-7:  Small broad-based disc osteophyte complex greater to the right.  Mild cord contact/flattening.  Moderate foraminal narrowing greater on the right.  C7-T1:  Bulge.  T1-2:  Facet joint degenerative changes and bulge.  T2-3:  Bulge.  IMPRESSION:  Large epidural hematoma.  Right posterior lateral loculated component  extends from the C6-7 disc space level to the mid T2 level spanning over 3.9 cm with maximal transverse dimension of 1.2 x 0.7 cm compressing the cord anteriorly and to the left.  Within the compressed cord slight increased signal suggestive of edema on STIR sequence.  Additionally, broad-based epidural hematoma lateral and posterior position (spanning between the neural foramen) extends from the C2 to the T3-T4 level.  Maximal thickness is 4.5 mm.  Neurosurgical consultation recommended.  Baseline cervical spondylotic changes as noted above.  Critical Value/emergent results were called by telephone at the time of interpretation on 07/08/2012 at 9:45 p.m. to Dr.  Manus Gunning, who verbally acknowledged these results.   Original Report Authenticated By: Fuller Canada, M.D.    Dg Chest Portable 1 View  07/08/2012  *RADIOLOGY REPORT*  Clinical Data: Right leg weakness, hypertension  PORTABLE CHEST - 1 VIEW  Comparison: Chest x-ray of 11/23/2003  Findings: The lungs are clear.  Mediastinal contours appear stable. The heart is mildly enlarged and stable.  No bony abnormality is seen.  IMPRESSION: Stable chest x-ray.  No active lung disease.   Original Report Authenticated By: Juline Patch, M.D.     Review of Systems  HENT: Positive for neck pain.   Eyes: Negative.   Respiratory: Negative.   Cardiovascular: Negative.   Gastrointestinal: Negative.   Genitourinary: Negative.   Skin: Negative.   Neurological: Positive for weakness.  Endo/Heme/Allergies: Negative.   Psychiatric/Behavioral: Negative.     Blood pressure 126/67, pulse 64, temperature 97.9 F (36.6 C), temperature source Oral, resp. rate 24, SpO2 94.00%. Physical Exam  Constitutional: She is oriented to person,  place, and time. She appears well-developed and well-nourished.  HENT:  Head: Normocephalic and atraumatic.  Eyes: Conjunctivae and EOM are normal. Pupils are equal, round, and reactive to light.  Neck: Normal range of motion.       Pain in posterior right side of neck  Cardiovascular: Normal rate and regular rhythm.   Respiratory: Effort normal and breath sounds normal.  GI: Soft. Bowel sounds are normal.  Musculoskeletal: Normal range of motion.  Neurological: She is alert and oriented to person, place, and time. She displays abnormal reflex. She exhibits abnormal muscle tone.       Increased tone in right lower extremity with decreased volitional motor function she is able to wiggle her toes with 3/5 strength she has 1/5 strength in the iliopsoas when out of 5 strength in the quad tone does appear to be increased. Occasional clonic movements of the right lower extremity are noted  these occur spontaneously and are not controlled by the patient  Skin: Skin is warm and dry.  Psychiatric: She has a normal mood and affect. Her behavior is normal. Judgment and thought content normal.     Assessment/Plan  cervical epidural hematoma C7-T1 on the right. Weakness of right lower extremity.  Patient is to be taken to the operating room to undergo surgical decompression   Charlina Dwight J 07/08/2012, 10:47 PM

## 2012-07-08 NOTE — ED Notes (Signed)
Per EMS, Pt. Received injection between c5 and c6 for neck pain this afternoon. States went home and now can't move right leg.

## 2012-07-08 NOTE — Anesthesia Preprocedure Evaluation (Addendum)
Anesthesia Evaluation  Patient identified by MRN, date of birth, ID band Patient awake    Reviewed: Allergy & Precautions, H&P , NPO status , Patient's Chart, lab work & pertinent test results  Airway   Neck ROM: full    Dental   Pulmonary          Cardiovascular Rhythm:regular Rate:Normal     Neuro/Psych  Headaches,    GI/Hepatic GERD-  ,  Endo/Other    Renal/GU Renal disease     Musculoskeletal   Abdominal   Peds  Hematology   Anesthesia Other Findings   Reproductive/Obstetrics                           Anesthesia Physical Anesthesia Plan  ASA: III and Emergent  Anesthesia Plan: General   Post-op Pain Management:    Induction: Intravenous  Airway Management Planned: Oral ETT  Additional Equipment:   Intra-op Plan:   Post-operative Plan: Extubation in OR  Informed Consent: I have reviewed the patients History and Physical, chart, labs and discussed the procedure including the risks, benefits and alternatives for the proposed anesthesia with the patient or authorized representative who has indicated his/her understanding and acceptance.     Plan Discussed with: CRNA, Anesthesiologist and Surgeon  Anesthesia Plan Comments:        Anesthesia Quick Evaluation

## 2012-07-09 ENCOUNTER — Emergency Department (HOSPITAL_COMMUNITY): Payer: 59

## 2012-07-09 ENCOUNTER — Telehealth: Payer: Self-pay | Admitting: Family Medicine

## 2012-07-09 ENCOUNTER — Encounter (HOSPITAL_COMMUNITY): Payer: Self-pay | Admitting: Neurological Surgery

## 2012-07-09 LAB — MRSA PCR SCREENING: MRSA by PCR: NEGATIVE

## 2012-07-09 MED ORDER — PROPRANOLOL HCL 40 MG PO TABS
40.0000 mg | ORAL_TABLET | Freq: Every day | ORAL | Status: DC
  Administered 2012-07-09 – 2012-07-10 (×2): 40 mg via ORAL
  Filled 2012-07-09 (×4): qty 1

## 2012-07-09 MED ORDER — MENTHOL 3 MG MT LOZG: 1.0000 | LOZENGE | OROMUCOSAL | Status: DC | PRN

## 2012-07-09 MED ORDER — KETOROLAC TROMETHAMINE 15 MG/ML IJ SOLN
15.0000 mg | Freq: Four times a day (QID) | INTRAMUSCULAR | Status: AC
  Administered 2012-07-09 – 2012-07-10 (×5): 15 mg via INTRAVENOUS
  Filled 2012-07-09 (×5): qty 1

## 2012-07-09 MED ORDER — LIDOCAINE-EPINEPHRINE 1 %-1:100000 IJ SOLN
INTRAMUSCULAR | Status: DC | PRN
  Administered 2012-07-09: 5 mL

## 2012-07-09 MED ORDER — ESTROPIPATE 0.75 MG PO TABS
0.7500 mg | ORAL_TABLET | Freq: Every day | ORAL | Status: DC
  Administered 2012-07-09 – 2012-07-11 (×3): 0.75 mg via ORAL
  Filled 2012-07-09 (×3): qty 1

## 2012-07-09 MED ORDER — BUPIVACAINE HCL (PF) 0.5 % IJ SOLN
INTRAMUSCULAR | Status: DC | PRN
  Administered 2012-07-09: 5 mL

## 2012-07-09 MED ORDER — ACETAMINOPHEN 325 MG PO TABS: 650.0000 mg | ORAL_TABLET | ORAL | Status: DC | PRN

## 2012-07-09 MED ORDER — SODIUM CHLORIDE 0.9 % IJ SOLN
3.0000 mL | Freq: Two times a day (BID) | INTRAMUSCULAR | Status: DC
  Administered 2012-07-09 (×2): 3 mL via INTRAVENOUS
  Administered 2012-07-10: 10 mL via INTRAVENOUS

## 2012-07-09 MED ORDER — ONDANSETRON HCL 4 MG/2ML IJ SOLN
INTRAMUSCULAR | Status: DC | PRN
  Administered 2012-07-09: 4 mg via INTRAVENOUS

## 2012-07-09 MED ORDER — DEXAMETHASONE SODIUM PHOSPHATE 4 MG/ML IJ SOLN
INTRAMUSCULAR | Status: DC | PRN
  Administered 2012-07-09: 4 mg via INTRAVENOUS

## 2012-07-09 MED ORDER — HEMOSTATIC AGENTS (NO CHARGE) OPTIME
TOPICAL | Status: DC | PRN
  Administered 2012-07-09: 1 via TOPICAL

## 2012-07-09 MED ORDER — DOCUSATE SODIUM 100 MG PO CAPS
100.0000 mg | ORAL_CAPSULE | Freq: Two times a day (BID) | ORAL | Status: DC
  Administered 2012-07-09 – 2012-07-11 (×5): 100 mg via ORAL
  Filled 2012-07-09 (×5): qty 1

## 2012-07-09 MED ORDER — ALUM & MAG HYDROXIDE-SIMETH 200-200-20 MG/5ML PO SUSP: 30.0000 mL | Freq: Four times a day (QID) | ORAL | Status: DC | PRN

## 2012-07-09 MED ORDER — MORPHINE SULFATE 2 MG/ML IJ SOLN
1.0000 mg | INTRAMUSCULAR | Status: DC | PRN
  Administered 2012-07-09 – 2012-07-10 (×2): 2 mg via INTRAVENOUS
  Filled 2012-07-09 (×2): qty 1

## 2012-07-09 MED ORDER — DEXAMETHASONE SODIUM PHOSPHATE 10 MG/ML IJ SOLN
10.0000 mg | INTRAMUSCULAR | Status: DC
  Administered 2012-07-09 – 2012-07-10 (×9): 10 mg via INTRAVENOUS
  Filled 2012-07-09 (×14): qty 1

## 2012-07-09 MED ORDER — ACETAMINOPHEN 650 MG RE SUPP: 650.0000 mg | RECTAL | Status: DC | PRN

## 2012-07-09 MED ORDER — CEFAZOLIN SODIUM 1-5 GM-% IV SOLN
1.0000 g | Freq: Three times a day (TID) | INTRAVENOUS | Status: AC
  Administered 2012-07-09 (×2): 1 g via INTRAVENOUS
  Filled 2012-07-09 (×2): qty 50

## 2012-07-09 MED ORDER — OXYCODONE-ACETAMINOPHEN 5-325 MG PO TABS
1.0000 | ORAL_TABLET | ORAL | Status: DC | PRN
  Administered 2012-07-09 – 2012-07-11 (×7): 1 via ORAL
  Administered 2012-07-11: 2 via ORAL
  Administered 2012-07-11: 1 via ORAL
  Filled 2012-07-09 (×6): qty 1
  Filled 2012-07-09: qty 2
  Filled 2012-07-09: qty 1
  Filled 2012-07-09: qty 2

## 2012-07-09 MED ORDER — 0.9 % SODIUM CHLORIDE (POUR BTL) OPTIME
TOPICAL | Status: DC | PRN
  Administered 2012-07-09: 1000 mL

## 2012-07-09 MED ORDER — MUPIROCIN 2 % EX OINT
TOPICAL_OINTMENT | CUTANEOUS | Status: DC | PRN
  Administered 2012-07-09: 1 via TOPICAL

## 2012-07-09 MED ORDER — PANTOPRAZOLE SODIUM 40 MG PO TBEC
40.0000 mg | DELAYED_RELEASE_TABLET | Freq: Every day | ORAL | Status: DC
  Administered 2012-07-09 – 2012-07-11 (×3): 40 mg via ORAL
  Filled 2012-07-09 (×3): qty 1

## 2012-07-09 MED ORDER — ONDANSETRON HCL 4 MG/2ML IJ SOLN
4.0000 mg | INTRAMUSCULAR | Status: DC | PRN
  Administered 2012-07-09 – 2012-07-11 (×5): 4 mg via INTRAVENOUS
  Filled 2012-07-09 (×5): qty 2

## 2012-07-09 MED ORDER — SODIUM CHLORIDE 0.9 % IV SOLN
INTRAVENOUS | Status: DC
  Administered 2012-07-09: 02:00:00 via INTRAVENOUS
  Administered 2012-07-09: 75 mL/h via INTRAVENOUS
  Administered 2012-07-09: 1000 mL via INTRAVENOUS
  Administered 2012-07-10 – 2012-07-11 (×2): via INTRAVENOUS

## 2012-07-09 MED ORDER — MEDROXYPROGESTERONE ACETATE 2.5 MG PO TABS
2.5000 mg | ORAL_TABLET | Freq: Every day | ORAL | Status: DC
  Administered 2012-07-09 – 2012-07-11 (×3): 2.5 mg via ORAL
  Filled 2012-07-09 (×3): qty 1

## 2012-07-09 MED ORDER — SODIUM CHLORIDE 0.9 % IJ SOLN: 3.0000 mL | INTRAMUSCULAR | Status: DC | PRN

## 2012-07-09 MED ORDER — SENNA 8.6 MG PO TABS
1.0000 | ORAL_TABLET | Freq: Two times a day (BID) | ORAL | Status: DC
  Administered 2012-07-09 – 2012-07-11 (×5): 8.6 mg via ORAL
  Filled 2012-07-09 (×6): qty 1

## 2012-07-09 MED ORDER — VITAMIN C 500 MG PO TABS
1000.0000 mg | ORAL_TABLET | Freq: Every day | ORAL | Status: DC
Start: 2012-07-09 — End: 2012-07-11
  Administered 2012-07-09 – 2012-07-11 (×3): 1000 mg via ORAL
  Filled 2012-07-09 (×3): qty 2

## 2012-07-09 MED ORDER — HYDROMORPHONE HCL PF 1 MG/ML IJ SOLN: 0.2500 mg | INTRAMUSCULAR | Status: DC | PRN

## 2012-07-09 MED ORDER — LACTATED RINGERS IV SOLN
INTRAVENOUS | Status: DC | PRN
  Administered 2012-07-09: via INTRAVENOUS

## 2012-07-09 MED ORDER — DIPHENHYDRAMINE HCL 25 MG PO CAPS
25.0000 mg | ORAL_CAPSULE | Freq: Every day | ORAL | Status: DC
Start: 2012-07-09 — End: 2012-07-11
  Administered 2012-07-09 – 2012-07-10 (×2): 25 mg via ORAL
  Filled 2012-07-09 (×3): qty 1

## 2012-07-09 MED ORDER — DROPERIDOL 2.5 MG/ML IJ SOLN
INTRAMUSCULAR | Status: DC | PRN
  Administered 2012-07-09: 0.625 mg via INTRAVENOUS

## 2012-07-09 MED ORDER — BIOTENE DRY MOUTH MT LIQD
15.0000 mL | Freq: Two times a day (BID) | OROMUCOSAL | Status: DC
  Administered 2012-07-09 – 2012-07-10 (×2): 15 mL via OROMUCOSAL

## 2012-07-09 MED ORDER — LORATADINE 10 MG PO TABS
10.0000 mg | ORAL_TABLET | Freq: Every day | ORAL | Status: DC
  Administered 2012-07-09 – 2012-07-11 (×3): 10 mg via ORAL
  Filled 2012-07-09 (×3): qty 1

## 2012-07-09 MED ORDER — ONDANSETRON HCL 4 MG/2ML IJ SOLN: 4.0000 mg | Freq: Once | INTRAMUSCULAR | Status: DC | PRN

## 2012-07-09 MED ORDER — NEOSTIGMINE METHYLSULFATE 1 MG/ML IJ SOLN
INTRAMUSCULAR | Status: DC | PRN
  Administered 2012-07-09: 3 mg via INTRAVENOUS

## 2012-07-09 MED ORDER — SODIUM CHLORIDE 0.9 % IV SOLN: 250.0000 mL | INTRAVENOUS | Status: DC

## 2012-07-09 MED ORDER — CEFAZOLIN SODIUM 1-5 GM-% IV SOLN
INTRAVENOUS | Status: DC | PRN
  Administered 2012-07-08: 1 g via INTRAVENOUS

## 2012-07-09 MED ORDER — SODIUM CHLORIDE 0.9 % IR SOLN
Status: DC | PRN
  Administered 2012-07-09

## 2012-07-09 MED ORDER — PHENOL 1.4 % MT LIQD: 1.0000 | OROMUCOSAL | Status: DC | PRN

## 2012-07-09 MED ORDER — DEXAMETHASONE SODIUM PHOSPHATE 10 MG/ML IJ SOLN
INTRAMUSCULAR | Status: AC
  Administered 2012-07-09: 10 mg via INTRAVENOUS
  Filled 2012-07-09: qty 1

## 2012-07-09 MED ORDER — GLYCOPYRROLATE 0.2 MG/ML IJ SOLN
INTRAMUSCULAR | Status: DC | PRN
  Administered 2012-07-09: 0.4 mg via INTRAVENOUS

## 2012-07-09 MED ORDER — BUTALBITAL-APAP-CAFFEINE 50-325-40 MG PO TABS: 1.0000 | ORAL_TABLET | Freq: Two times a day (BID) | ORAL | Status: DC | PRN

## 2012-07-09 MED ORDER — DIAZEPAM 5 MG PO TABS: 5.0000 mg | ORAL_TABLET | Freq: Four times a day (QID) | ORAL | Status: DC | PRN

## 2012-07-09 MED ORDER — THROMBIN 5000 UNITS EX KIT
PACK | CUTANEOUS | Status: DC | PRN
  Administered 2012-07-09 (×2): 5000 [IU] via TOPICAL

## 2012-07-09 NOTE — Op Note (Signed)
Preoperative diagnosis: Epidural hematoma C7-T2, status post cervical epidural steroid injection  Postoperative diagnosis: Epidural hematoma C7 and T2 status post cervical epidural steroid injection Procedure: C7, T1, T2 laminectomy evacuation of epidural hematoma Surgeon: Barnett Abu M.D. Anesthesia: Gen. endotracheal Indications: Patient is a 61 year old individual who today had had an epidural steroid injection at C7-T1 she developed substantial neck and shoulder pain and then the vault weakness in the right lower extremity issues seen in the emergency room where an MRI demonstrated a presence of an epidural hematoma extending from C7-T2 mostly on the right side. She was advised regarding the need for emergent surgery.  Procedure: Patient was brought to the operating room supine on a stretcher after the smooth induction of general endotracheal anesthesia she was turned prone after being placed in a 3 pin headrest. The patient's neck was secured in a slightly flexed fashion and care was taken to protect the chin. The back of the neck was prepped with alcohol and DuraPrep and draped in a sterile fashion. A midline incision was created over the lower cervical spine the dissection was carried down to the cervical dorsal fascia. This was opened on the right side of the midline. A subperiosteal dissection was performed and the first spinous process was identified radiographically as that of C5. I can in down from this C7-T1 and T2 are identified. A subperiosteal dissection was carried out over the laminar arches of these vertebrae. A self-retaining retractor was placed in the wound. With adequate hemostatic control laminotomies were created at C7-T1 on the right side. As the yellow ligament and was taken up and the bone was exposed the epidural hematoma was exposed. This was a hard rubbery hemorrhage. His removed in a piecemeal fashion with curettes and rongeurs. Probes were used to dissect the hematoma under  the bony edges and remove it. A laminotomy was then created at T1 and T2. More epidural hematoma was encountered here the hematoma was dissected carefully and removed. Once all the hematoma was removed probes could be passed under laminar arches between all of the vertebrae and no hematoma was encountered. The area was irrigated copiously with antibiotic ear getting solution even in the epidural spaces. When no further [place] was identified hemostasis from the epidural spaces was obtained with Gelfoam. This was later irrigated away. A medium size Hemovac drain was left in the epidural spaces and brought out through separate incision. The cervical dorsal fascia was then closed with #1 Vicryl in an interrupted fashion. 2-0 Vicryl was used in the subcutaneous tissues, and 3-0 Vicryl was used to close the subcuticular skin. Surgical staples were used on the skin. A dry sterile dressing was placed over mupirocin ointment. The patient was then removed from the 3 pin headrest turned to the supine position and returned to the recovery room. Blood loss was estimated at 75 cc.

## 2012-07-09 NOTE — Progress Notes (Signed)
Clinical Social Worker received referral for New SNF placement. Clinical Social Worker reviewed chart and noted PT evaluation recommending HH PT. No social work needs identified. Clinical Social Worker signing off. Please re-consult if further social work needs arise.   Jacklynn Lewis, MSW, LCSWA  Clinical Social Work [phone]

## 2012-07-09 NOTE — Telephone Encounter (Signed)
Call in #[phone] with 5 rf 

## 2012-07-09 NOTE — Transfer of Care (Signed)
Immediate Anesthesia Transfer of Care Note  Patient: Victoria Mosley  Procedure(s) Performed: Procedure(s) (LRB) with comments: POSTERIOR CERVICAL LAMINECTOMY (N/A) - Cervical Seven- thoracic One Laminectomy and Decompression of Epidural Hematoma EVACUATION HEMATOMA (N/A) - Cervical Seven-thoracic One Laminectomy and Decompression of Epidural Hematoma  Patient Location:Neuro ICU  Anesthesia Type: General  Level of Consciousness: sedated, patient cooperative and responds to stimulation  Airway & Oxygen Therapy: Patient Spontanous Breathing and Patient connected to nasal cannula oxygen  Post-op Assessment: Report given to PACU RN and Post -op Vital signs reviewed and stable Moving extremities except right leg/foot  Post vital signs: Reviewed and stable  Complications: No apparent anesthesia complications

## 2012-07-09 NOTE — Progress Notes (Signed)
Patient ID: Victoria Mosley, female   DOB: 24-Mar-1951, 61 y.o.   MRN: [id number] I will signs are stable. Motor function is good in all extremities except right lower extremity. There is 2/5 strength in the iliopsoas 2-3/5 strength in the quadriceps 2-3/5 strength in dorsi and plantar flexors with markedly increased tone persisting. Patient notes there is less jerking and lower extremity.  Leave the drain in for today plan mobilization as tolerated physical therapy and occupational therapy to see patient.

## 2012-07-09 NOTE — Telephone Encounter (Signed)
Please send script for Fioricet 50-325-40 mg take 1 po q4hrs prn and send to [facility], also call pt. ( [facility] can no longer get the brand name )

## 2012-07-09 NOTE — Anesthesia Postprocedure Evaluation (Signed)
  Anesthesia Post-op Note  Patient: Victoria Mosley  Procedure(s) Performed: Procedure(s) (LRB) with comments: POSTERIOR CERVICAL LAMINECTOMY (N/A) - Cervical Seven- thoracic One Laminectomy and Decompression of Epidural Hematoma EVACUATION HEMATOMA (N/A) - Cervical Seven-thoracic One Laminectomy and Decompression of Epidural Hematoma  Patient Location: ICU  Anesthesia Type: General  Level of Consciousness: awake, alert  and oriented  Airway and Oxygen Therapy: Patient Spontanous Breathing  Post-op Pain: none  Post-op Assessment: Post-op Vital signs reviewed, Patient's Cardiovascular Status Stable, Respiratory Function Stable, Patent Airway, No signs of Nausea or vomiting, Adequate PO intake and Pain level controlled  Post-op Vital Signs: Reviewed and stable  Complications: No apparent anesthesia complications

## 2012-07-09 NOTE — Evaluation (Signed)
Physical Therapy Evaluation Patient Details Name: Victoria Mosley MRN: [id number] DOB: Jan 26, 1951 Today's Date: 07/09/2012 Time: 4098-1191 PT Time Calculation (min): 32 min  PT Assessment / Plan / Recommendation Clinical Impression  pt admitted for sudden progressive loss of function primarily in the R LE.  Found to be a cervical  epidural hematoma. S/P evacuation of tumor and deomp. laminectomy  of multimple cervical and upper thoracic vertebrae.  Weakness continues, but is improving.  Hopefully will quickly resolve and be able to get home with HHPT    PT Assessment  Patient needs continued PT services    Follow Up Recommendations  Home health PT    Barriers to Discharge        Equipment Recommendations  Tub/shower bench    Recommendations for Other Services     Frequency Min 5X/week    Precautions / Restrictions Precautions Precautions: Back;Fall Restrictions Weight Bearing Restrictions: No   Pertinent Vitals/Pain       Mobility  Bed Mobility Bed Mobility: Rolling Left;Left Sidelying to Sit;Sitting - Scoot to Edge of Bed Left Sidelying to Sit: 4: Min assist Sitting - Scoot to [place] of Bed: 4: Min assist Details for Bed Mobility Assistance: vc's for safe technique, truncal assist Transfers Transfers: Sit to Stand;Stand to Sit Sit to Stand: 3: Mod assist Stand to Sit: 4: Min assist Details for Transfer Assistance: vc's for hand placement ; lifting assist and help to come forward. Ambulation/Gait Ambulation/Gait Assistance: 4: Min assist Ambulation Distance (Feet): 70 Feet Assistive device: Rolling walker Ambulation/Gait Assistance Details: weak kneed gait, but with good heel toe  Gait Pattern: Step-through pattern;Decreased step length - right;Decreased step length - left;Decreased stride length Stairs: No Wheelchair Mobility Wheelchair Mobility: No    Exercises     PT Diagnosis: Generalized weakness;Acute pain  PT Problem List: Decreased strength;Decreased  activity tolerance;Decreased mobility;Decreased coordination;Decreased knowledge of use of DME;Decreased knowledge of precautions;Pain PT Treatment Interventions: DME instruction;Gait training;Functional mobility training;Therapeutic activities;Neuromuscular re-education;Patient/family education   PT Goals Acute Rehab PT Goals PT Goal Formulation: With patient Time For Goal Achievement: 07/16/12 Potential to Achieve Goals: Good Pt will go Supine/Side to Sit: with supervision PT Goal: Supine/Side to Sit - Progress: Goal set today Pt will go Sit to Stand: with supervision PT Goal: Sit to Stand - Progress: Goal set today Pt will Transfer Bed to Chair/Chair to Bed: with supervision PT Transfer Goal: Bed to Chair/Chair to Bed - Progress: Goal set today Pt will Ambulate: >150 feet;with supervision;with least restrictive assistive device PT Goal: Ambulate - Progress: Goal set today  Visit Information  Last PT Received On: 07/09/12 Assistance Needed: +1    Subjective Data  Subjective: You don't know how it feels to have no use of your R leg Patient Stated Goal: Back to Independent,  easily walking   Prior Functioning  Home Living Lives With: Spouse Available Help at Discharge: Family Type of Home: House Home Access: Other (comment) Home Layout: One level Bathroom Shower/Tub: Tub/shower unit;C[employer]: Standard Bathroom Accessibility: Yes How Accessible: Accessible via walker Home Adaptive Equipment: Bedside commode/3-in-1;Straight cane;Walker - rolling Prior Function Level of Independence: Independent Able to Take Stairs?: Yes Driving: Yes Vocation: Full time employment Communication Communication: No difficulties Dominant Hand: Right    Cognition  Overall Cognitive Status: Appears within functional limits for tasks assessed/performed Arousal/Alertness: Awake/alert Orientation Level: Oriented X4 / Intact Behavior During Session: [facility] for tasks performed      Extremity/Trunk Assessment Right Lower Extremity Assessment RLE ROM/Strength/Tone: [facility] for tasks  assessed Left Lower Extremity Assessment LLE ROM/Strength/Tone: [facility] for tasks assessed;Deficits LLE ROM/Strength/Tone Deficits: bil Grossly weak, but R LE weaker that L at 4-/5 Trunk Assessment Trunk Assessment: Normal   Balance Balance Balance Assessed: Yes Static Sitting Balance Static Sitting - Balance Support: Feet supported;Right upper extremity supported Static Sitting - Level of Assistance: 5: Stand by assistance  End of Session PT - End of Session Equipment Utilized During Treatment: Gait belt Activity Tolerance: Patient tolerated treatment well Patient left: in chair;with call bell/phone within reach Nurse Communication: Mobility status;Other (comment) (pt reports mildly nauseous)  GP     Una Yeomans, Eliseo Gum 07/09/2012, 4:19 PM  07/09/2012  [place], PT [phone] (pager)

## 2012-07-10 DIAGNOSIS — M4712 Other spondylosis with myelopathy, cervical region: Secondary | ICD-10-CM

## 2012-07-10 MED ORDER — DEXAMETHASONE SODIUM PHOSPHATE 4 MG/ML IJ SOLN
4.0000 mg | Freq: Four times a day (QID) | INTRAMUSCULAR | Status: DC
  Administered 2012-07-10 – 2012-07-11 (×6): 4 mg via INTRAVENOUS
  Filled 2012-07-10 (×2): qty 1
  Filled 2012-07-10: qty 0.4
  Filled 2012-07-10 (×3): qty 1
  Filled 2012-07-10: qty 0.4
  Filled 2012-07-10: qty 1
  Filled 2012-07-10 (×2): qty 0.4
  Filled 2012-07-10: qty 1

## 2012-07-10 MED ORDER — SODIUM CHLORIDE 0.9 % IV SOLN: 250.0000 mL | INTRAVENOUS | Status: DC

## 2012-07-10 MED ORDER — TAMSULOSIN HCL 0.4 MG PO CAPS
0.4000 mg | ORAL_CAPSULE | Freq: Every day | ORAL | Status: DC
  Administered 2012-07-10 – 2012-07-11 (×2): 0.4 mg via ORAL
  Filled 2012-07-10 (×3): qty 1

## 2012-07-10 MED ORDER — INFLUENZA VIRUS VACC SPLIT PF IM SUSP
0.5000 mL | INTRAMUSCULAR | Status: AC
  Administered 2012-07-11: 0.5 mL via INTRAMUSCULAR
  Filled 2012-07-10: qty 0.5

## 2012-07-10 NOTE — Evaluation (Signed)
Occupational Therapy Evaluation Patient Details Name: Victoria Mosley MRN: [id number] DOB: [birth year] Today's Date: 07/10/2012 Time: 7829-5621 OT Time Calculation (min): 18 min  OT Assessment / Plan / Recommendation Clinical Impression  61 yo female admitted s/p epidural cervical steroid injection 07/08/2012 per Dr. Alvester Morin. Pt with Rt LE weakness and MRI (+) for epidural hematoma with cord compression. OT to follow acutely. Recommend CIr for d/c planning     OT Assessment  Patient needs continued OT Services    Follow Up Recommendations  Inpatient Rehab    Barriers to Discharge      Equipment Recommendations  Defer to next venue    Recommendations for Other Services Rehab consult  Frequency  Min 2X/week    Precautions / Restrictions Precautions Precautions: Back;Fall Required Braces or Orthoses: Other Brace/Splint Restrictions Weight Bearing Restrictions: No   Pertinent Vitals/Pain 4 out 10 pain at neck surg site    ADL  Grooming: Performed;Teeth care;Min guard (all items setup on container so no opening required) Where Assessed - Grooming: Supported standing Lower Body Dressing: Performed;+1 Total assistance Where Assessed - Lower Body Dressing: Supported sitting (husband pulled socks up ) Toilet Transfer: Simulated;Minimal assistance Toilet Transfer Method: Sit to Barista: Raised toilet seat with arms (or 3-in-1 over toilet) Equipment Used: Gait belt;Rolling walker Transfers/Ambulation Related to ADLs: Pt completed sit<> stand from chair with BIL UE used and weight shifting  ADL Comments: Pt's husband placing all ADL items on sink level prepared for patient. Pt able to weight shift onto Lt le and pushing sink pedals with Rt LE. Pt c/o pain on Lt scapula area s/p brushing teeth. Pt reports feeling more stable with RW. Pt with BIL knee surg needed and wears brace on Rt LE. Pt with Rt hip replacement. Pt reports hx of back pain     OT Diagnosis:  Generalized weakness  OT Problem List: Decreased strength;Decreased activity tolerance;Impaired balance (sitting and/or standing);Decreased safety awareness;Decreased knowledge of use of DME or AE;Decreased knowledge of precautions;Pain OT Treatment Interventions: Self-care/ADL training;Therapeutic exercise;Neuromuscular education;DME and/or AE instruction;Therapeutic activities;Patient/family education;Balance training   OT Goals Acute Rehab OT Goals OT Goal Formulation: With patient Time For Goal Achievement: 07/24/12 Potential to Achieve Goals: Good ADL Goals Pt Will Perform Grooming: with set-up;Sitting at sink;Unsupported ADL Goal: Grooming - Progress: Goal set today Pt Will Perform Upper Body Bathing: with set-up;Standing at sink;Unsupported ADL Goal: Upper Body Bathing - Progress: Goal set today Pt Will Perform Upper Body Dressing: with set-up;Sit to stand from chair ADL Goal: Upper Body Dressing - Progress: Goal set today Pt Will Transfer to Toilet: with set-up;3-in-1;Ambulation ADL Goal: Toilet Transfer - Progress: Goal set today Pt Will Perform Toileting - Clothing Manipulation: with supervision;Sitting on 3-in-1 or toilet ADL Goal: Toileting - Clothing Manipulation - Progress: Goal set today Pt Will Perform Toileting - Hygiene: with set-up;Sit to stand from 3-in-1/toilet ADL Goal: Toileting - Hygiene - Progress: Goal set today Miscellaneous OT Goals Miscellaneous OT Goal #1: Pt will complete bed mobility Supervision level without rails and HOb 20 degrees or less OT Goal: Miscellaneous Goal #1 - Progress: Goal set today  Visit Information  Last OT Received On: 07/10/12 Assistance Needed: +1 PT/OT Co-Evaluation/Treatment: Yes    Subjective Data  Subjective: "i am the bread winner and this 2 day thing has become 6-8 weeks" Patient Stated Goal: to return to work and have less pain   Prior Functioning  Vision/Perception  Home Living Lives With: Spouse Available Help at  Discharge: Family  Type of Home: House Home Access: Other (comment) Home Layout: One level Bathroom Shower/Tub: Tub/shower unit;Curtain Firefighter: Standard Bathroom Accessibility: Yes How Accessible: Accessible via walker Home Adaptive Equipment: Bedside commode/3-in-1;Straight cane;Walker - rolling Prior Function Level of Independence: Independent Able to Take Stairs?: Yes Driving: Yes Vocation: Full time employment Communication Communication: No difficulties Dominant Hand: Right      Cognition  Overall Cognitive Status: Appears within functional limits for tasks assessed/performed Arousal/Alertness: Awake/alert Orientation Level: Oriented X4 / Intact Behavior During Session: WFL for tasks performed    Extremity/Trunk Assessment Right Upper Extremity Assessment RUE ROM/Strength/Tone: Within functional levels RUE Coordination: WFL - gross/fine motor Left Upper Extremity Assessment LUE ROM/Strength/Tone: Within functional levels LUE Coordination: WFL - gross/fine motor   Mobility  Shoulder Instructions  Bed Mobility Bed Mobility: Not assessed Transfers Sit to Stand: 4: Min assist;With upper extremity assist;From chair/3-in-1 Stand to Sit: 4: Min assist;With upper extremity assist;To chair/3-in-1 Details for Transfer Assistance: vc's for hand placement ; lifting assist and help to come forward.       Exercise     Balance Balance Balance Assessed: Yes Static Sitting Balance Static Sitting - Balance Support: Feet supported;Bilateral upper extremity supported Static Sitting - Level of Assistance: 5: Stand by assistance Static Standing Balance Static Standing - Balance Support: Left upper extremity supported;During functional activity Static Standing - Level of Assistance: 5: Stand by assistance Static Standing - Comment/# of Minutes: 4 minutes brushing teeth   End of Session OT - End of Session Activity Tolerance: Patient tolerated treatment well Patient  left: Other (comment) (ambulating with PT) Nurse Communication: Mobility status  GO     Harrel Carina [facility] 07/10/2012, 2:16 PM Pager: [phone]

## 2012-07-10 NOTE — Progress Notes (Signed)
Patient ID: Victoria Mosley, female   DOB: 02-10-1951, 61 y.o.   MRN: [id number] Visited patient who had cervical ESI by Dr. Alvester Morin with post injection epidural hematoma . Appreciate prompt neurosurgical care by Dr. Danielle Dess. She is getting some right leg motor return. Adductor strong but quad still weak and not able to SLR. Ankle DF and PF returned.     No arm pain.   Thank you from Dr. Alvester Morin for the care you have provided.

## 2012-07-10 NOTE — Progress Notes (Signed)
Pt voided insufficient amount at 0600 hr. Iin and out catheter was done a second time. Pt was educated on needing to void by 1300 hr.

## 2012-07-10 NOTE — Progress Notes (Signed)
Pt's foley d/c at 1500 hr 07/09/12 by previous RN. Pt voided insufficient amount within 6 hours. Bladder scan showed > 300 mL. In and out catheter done once at 0100 hr 07/10/12. Pt was educated on voiding by 0700 hr.

## 2012-07-10 NOTE — Progress Notes (Signed)
Patient ID: Victoria Mosley, female   DOB: May 31, 1951, 61 y.o.   MRN: [id number] Vital signs stable. Patient appears awake and alert. Right lower extremity appears to be functioning better.  Dressing dry drain with minimal output motor function 3/5 in iliopsoas quadricep tibialis anterior and gastrocs on right.  Plan: DC drain this afternoon the patient remained stable transfer to floor, rehabilitation medicine consult.

## 2012-07-10 NOTE — Progress Notes (Signed)
Physical Therapy Treatment Patient Details Name: Victoria Mosley MRN: [id number] DOB: [birth year] Today's Date: 07/10/2012 Time: 7829-5621 PT Time Calculation (min): 29 min  PT Assessment / Plan / Recommendation Comments on Treatment Session  `    Follow Up Recommendations       Barriers to Discharge        Equipment Recommendations  Tub/shower bench    Recommendations for Other Services    Frequency Min 5X/week   Plan Discharge plan remains appropriate    Precautions / Restrictions Precautions Precautions: Back;Fall Required Braces or Orthoses: Other Brace/Splint (knee brace R) Restrictions Weight Bearing Restrictions: No   Pertinent Vitals/Pain     Mobility  Bed Mobility Bed Mobility: Not assessed Transfers Transfers: Sit to Stand;Stand to Sit Sit to Stand: 4: Min assist;With upper extremity assist;From chair/3-in-1 Stand to Sit: 4: Min assist;With upper extremity assist;To chair/3-in-1 Details for Transfer Assistance: vc's for hand placement ; lifting assist and help to come forward. Ambulation/Gait Ambulation/Gait Assistance: 4: Min guard Ambulation Distance (Feet): 130 Feet Assistive device: Rolling walker Ambulation/Gait Assistance Details: mildly unsteady gait, not as weak knee'd as last treatment,  pt is much more unsteady with HHA. Gait Pattern: Step-through pattern;Decreased step length - right;Decreased step length - left;Decreased stride length Stairs: No Wheelchair Mobility Wheelchair Mobility: No    Exercises     PT Diagnosis:    PT Problem List:   PT Treatment Interventions:     PT Goals Acute Rehab PT Goals Time For Goal Achievement: 07/16/12 Potential to Achieve Goals: Good PT Goal: Sit to Stand - Progress: Progressing toward goal PT Transfer Goal: Bed to Chair/Chair to Bed - Progress: Progressing toward goal PT Goal: Ambulate - Progress: Progressing toward goal  Visit Information  Last PT Received On: 07/10/12 Assistance Needed:  +1 PT/OT Co-Evaluation/Treatment: Yes    Subjective Data  Subjective: I'm worried that I can't raise my leg straight up. Patient Stated Goal: Back to Independent,  easily walking   Cognition  Overall Cognitive Status: Appears within functional limits for tasks assessed/performed Arousal/Alertness: Awake/alert Orientation Level: Oriented X4 / Intact Behavior During Session: [facility] for tasks performed    Balance  Balance Balance Assessed: Yes Static Sitting Balance Static Sitting - Balance Support: Feet supported;Bilateral upper extremity supported Static Sitting - Level of Assistance: 5: Stand by assistance Static Standing Balance Static Standing - Balance Support: Left upper extremity supported;During functional activity Static Standing - Level of Assistance: 5: Stand by assistance Static Standing - Comment/# of Minutes: 4 minutes brushing teeth  End of Session PT - End of Session Equipment Utilized During Treatment: Gait belt Activity Tolerance: Patient tolerated treatment well Patient left: in chair;with call bell/phone within reach Nurse Communication: Mobility status;Other (comment)   GP     Ugochi Henzler, Eliseo Gum 07/10/2012, 12:23 PM  07/10/2012  [place] Bing, PT [phone] (pager)

## 2012-07-10 NOTE — Consult Note (Signed)
Physical Medicine and Rehabilitation Consult Reason for Consult: Cervical epidural hematoma Referring Physician: Dr. Danielle Dess   HPI: Victoria Mosley is a 61 y.o. right-handed female admitted 07/08/2012 after epidural cervical steroid injection 07/08/2012 per Dr. Alvester Morin. Patient noted approximately 1/2 hour to one hour after discharge she noted some weakness of her right leg and increasing neck pain. She returned to the emergency room And MRI of the cervical spine was performed that showed epidural hematoma at the level of C7-T1 compressing and effacing the right side of her spinal cord. Underwent C7, T1, T2 laminectomy evacuation of epidural hematoma per Dr. Danielle Dess 07/09/2012. Pain management ongoing. She is currently on a Decadron protocol. Physical therapy evaluation completed 07/09/2012. M.D. is requested physical medicine rehabilitation consult to consider inpatient rehabilitation services   Review of Systems  HENT: Positive for neck pain.   Musculoskeletal: Positive for myalgias and back pain.  Neurological: Positive for tingling and headaches.  All other systems reviewed and are negative.   Past Medical History  Diagnosis Date  . Menopause     sees Dr. Audie Box  . 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 Migraines   . GERD (gastroesophageal reflux disease)   . Allergy   . IBS (irritable bowel syndrome)   . Arthritis   . Hyperlipidemia   . Diverticulitis   . Kidney stones   . Abnormal EKG 2012    stress test normal  . UTI (urinary tract infection)     started atb on 03-24-12  . Cancer   . Bilateral knee pain     sees Dr. Lequita Halt    Past Surgical History  Procedure Date  . Melanoma excision 2007    left upper arm per dr. Lonni Fix  . Foot surgery 1987    per Dr. Chaney Malling  . Nasal septum surgery 1992    per Dr. Berna Bue  . Lithotripsy 2007  . Lumbar laminectomy 1993    1993, at L5 per Dr. Roxan Hockey  . Colonoscopy 03-28-12    per Dr. Marina Goodell, polyp,  repeat in 5  yrs  . Cesarean section   . Tubal ligation   . Kidney stone surgery 2005    per Dr. Wanda Plump  . Total hip arthroplasty 2010    on right per Dr. Lequita Halt  . Posterior cervical laminectomy 07/08/2012    Procedure: POSTERIOR CERVICAL LAMINECTOMY;  Surgeon: Barnett Abu, MD;  Location: [facility];  Service: Neurosurgery;  Laterality: N/A;  Cervical Seven- thoracic One Laminectomy and Decompression of Epidural Hematoma  . Hematoma evacuation 07/08/2012    Procedure: EVACUATION HEMATOMA;  Surgeon: Barnett Abu, MD;  Location: [facility];  Service: Neurosurgery;  Laterality: N/A;  Cervical Seven-thoracic One Laminectomy and Decompression of Epidural Hematoma   Family History  Problem Relation Age of Onset  . Heart disease Mother   . Hypertension Mother   . Diabetes Mother   . Depression Father   . Hypertension Father   . Leukemia Father   . Hypertension Sister   . Heart disease Sister   . Cancer Sister     bladder  . Stomach cancer Paternal Grandmother   . Colon cancer Neg Hx   . Esophageal cancer Neg Hx   . Rectal cancer Neg Hx    Social History:  reports that she has quit smoking. She has never used smokeless tobacco. She reports that she does not drink alcohol or use illicit drugs. Allergies:  Allergies  Allergen Reactions  .  Pravastatin Sodium (Pravachol)     Joint pain(severe)  . Biaxin (Clarithromycin) Nausea And Vomiting  . Ciprofloxacin Nausea Only  . Clarithromycin Nausea Only   Medications Prior to Admission  Medication Sig Dispense Refill  . aspirin 81 MG tablet Take 81 mg by mouth daily.       Marland Kitchen aspirin-acetaminophen-caffeine (EXCEDRIN MIGRAINE) 250-250-65 MG per tablet Take 1 tablet by mouth every 6 (six) hours as needed. For migraine      . B Complex Vitamins (VITAMIN B COMPLEX PO) Take 1 each by mouth daily.        . butalbital-acetaminophen-caffeine (FIORICET, ESGIC) 50-325-40 MG per tablet Take 1 tablet by mouth 2 (two) times daily as needed. For migraine      .  Calcium-Vitamin D 600-200 MG-UNIT per tablet Take 1 tablet by mouth 2 (two) times daily.       Marland Kitchen dimenhyDRINATE (DRAMAMINE) 50 MG tablet Take 50 mg by mouth every 8 (eight) hours as needed. For nausea      . diphenhydrAMINE (BENADRYL) 25 mg capsule Take 25 mg by mouth at bedtime.        Marland Kitchen estropipate (OGEN) 0.75 MG tablet Take 1 tablet (0.75 mg total) by mouth daily.  90 tablet  4  . Flax OIL Take 1 each by mouth 2 (two) times daily.       Marland Kitchen ibuprofen (ADVIL,MOTRIN) 800 MG tablet Take 800 mg by mouth every 8 (eight) hours as needed. For pain      . loratadine (CLARITIN) 10 MG tablet Take 10 mg by mouth daily. For allergies       . medroxyPROGESTERone (PROVERA) 2.5 MG tablet Take 1 tablet (2.5 mg total) by mouth daily.  90 tablet  4  . Multiple Vitamins-Minerals (ONE-A-DAY WOMENS 50 PLUS PO) Take 1 each by mouth daily.        . naproxen sodium (ANAPROX) 220 MG tablet Take 220 mg by mouth every morning. Arthritis        . omeprazole (PRILOSEC) 20 MG capsule Take 20 mg by mouth daily.        Marland Kitchen OVER THE COUNTER MEDICATION Take 1 capsule by mouth 2 (two) times daily. Turmeric      . Potassium Gluconate 595 MG CAPS Take 1 each by mouth daily.        . predniSONE (DELTASONE) 10 MG tablet Take 1 tablet (10 mg total) by mouth as needed. For mouth ulcers  60 tablet  5  . propranolol (INDERAL) 40 MG tablet Take 1 tablet (40 mg total) by mouth daily.  90 tablet  3  . vitamin C (ASCORBIC ACID) 500 MG tablet Take 1,000 mg by mouth daily.         Home: Home Living Lives With: Spouse Available Help at Discharge: Family Type of Home: House Home Access: Other (comment) Home Layout: One level Bathroom Shower/Tub: Tub/shower unit;Curtain Firefighter: Standard Bathroom Accessibility: Yes How Accessible: Accessible via walker Home Adaptive Equipment: Bedside commode/3-in-1;Straight cane;Walker - rolling  Functional History: Prior Function Able to Take Stairs?: Yes Driving: Yes Vocation: Full time  employment Functional Status:  Mobility: Bed Mobility Bed Mobility: Rolling Left;Left Sidelying to Sit;Sitting - Scoot to Edge of Bed Left Sidelying to Sit: 4: Min assist Sitting - Scoot to [place] of Bed: 4: Min assist Transfers Transfers: Sit to Stand;Stand to Sit Sit to Stand: 3: Mod assist Stand to Sit: 4: Min assist Ambulation/Gait Ambulation/Gait Assistance: 4: Min assist Ambulation Distance (Feet): 70 Feet Assistive device: Rolling  walker Ambulation/Gait Assistance Details: weak kneed gait, but with good heel toe  Gait Pattern: Step-through pattern;Decreased step length - right;Decreased step length - left;Decreased stride length Stairs: No Wheelchair Mobility Wheelchair Mobility: No  ADL:    Cognition: Cognition Arousal/Alertness: Awake/alert Orientation Level: Oriented X4;Oriented to person;Oriented to place;Oriented to time;Oriented to situation Cognition Overall Cognitive Status: Appears within functional limits for tasks assessed/performed Arousal/Alertness: Awake/alert Orientation Level: Oriented X4 / Intact Behavior During Session: [facility] for tasks performed  Blood pressure 111/55, pulse 76, temperature 97.6 F (36.4 C), temperature source Axillary, resp. rate 16, height 5\' 7"  (1.702 m), weight 81.3 kg (179 lb 3.7 oz), SpO2 99.00%. Physical Exam  Vitals reviewed. Constitutional: She is oriented to person, place, and time. She appears well-developed.  HENT:  Head: Normocephalic.  Eyes:       Pupils round and reactive to light  Cardiovascular: Normal rate and regular rhythm.   Pulmonary/Chest: Breath sounds normal. No respiratory distress. She has no wheezes.  Abdominal: Bowel sounds are normal. She exhibits no distension. There is no tenderness.  Musculoskeletal: She exhibits no edema.  Neurological: She is alert and oriented to person, place, and time.  Skin:       Drains still in place from surgical procedure  Psychiatric: She has a normal mood and affect.    Decreased sensation to temperature on the left side Intact to light touch bilaterally Motor strength is 4/5 in the right deltoid, biceps, triceps, grip 5/5 in the left deltoid, biceps, triceps, grip 2 minus/5 in the right hip flexor knee extensor 4/5 in the right ankle dorsiflexor plantar flexor 5/5 in the left hip flexor, knee flexor, knee extensors, ankle dorsiflexor and plantar flexor Tone there is no evidence of clonus or increase in extensor tone  No results found for this or any previous visit (from the past 24 hour(s)). Dg Cervical Spine 1 View  07/09/2012  *RADIOLOGY REPORT*  Clinical Data: Cervical laminectomy.  DG CERVICAL SPINE - 1 VIEW  Comparison: MRI cervical spine 07/08/2012.  Findings: A single portable lateral view of the cervical spine is obtained with visualization of the cervical spine from the skull base through C5.  The lower cervical region is not visualized metallic structures consistent with surgical retractor and sponge marker in the soft tissues superficial to the C5-C7 spinous processes. Reversal of the usual cervical lordosis is likely positional.  OG and endotracheal tubes are visualized.  No prevertebral soft tissue swelling.  IMPRESSION: Portable lateral cervical spine demonstrating surgical hardware and spines marker   Original Report Authenticated By: Marlon Pel, M.D.    Mr Cervical Spine W Wo Contrast  07/08/2012  *RADIOLOGY REPORT*  Clinical Data: Numbness after spinal injection C6 level.  MRI CERVICAL SPINE WITHOUT AND WITH CONTRAST  Technique:  Multiplanar and multiecho pulse sequences of the cervical spine, to include the craniocervical junction and cervicothoracic junction, were obtained according to standard protocol without and with intravenous contrast.  Contrast: 15mL MULTIHANCE GADOBENATE DIMEGLUMINE 529 MG/ML IV SOLN  Comparison: 09/29/2009  Findings: Large epidural hematoma.  Right posterior lateral loculated component  extends from the C6-7 disc  space level to the mid T2 level spanning over 3.9 cm with maximal transverse dimension of 1.2 x 0.7 cm compressing the cord anteriorly and to the left. Within the compressed cord slight increased signal suggestive of edema on STIR sequence.  Additionally, broad-based epidural hematoma lateral and posterior position (spanning between the neural foramen) extends from the C2 to the T3-T4 level Maximal thickness  is 4.5 mm.  Cervical spondylotic changes including:  C2-3:  Small central protrusion. Facet joint degenerative changes greater on the right.  C3-4:  Tiny/small central protrusion. Uncinate bony overgrowth and mild bilateral foraminal narrowing.  C4-5:  Bulge/small broad-based protrusion with mild cord contact. Mild bilateral foraminal narrowing.  C5-6:  Broad-based disc osteophyte complex.  Slightly greater extension to the left. Mild cord flattening.  Moderate right-sided and  mild left-sided foraminal narrowing.  C6-7:  Small broad-based disc osteophyte complex greater to the right.  Mild cord contact/flattening.  Moderate foraminal narrowing greater on the right.  C7-T1:  Bulge.  T1-2:  Facet joint degenerative changes and bulge.  T2-3:  Bulge.  IMPRESSION:  Large epidural hematoma.  Right posterior lateral loculated component  extends from the C6-7 disc space level to the mid T2 level spanning over 3.9 cm with maximal transverse dimension of 1.2 x 0.7 cm compressing the cord anteriorly and to the left.  Within the compressed cord slight increased signal suggestive of edema on STIR sequence.  Additionally, broad-based epidural hematoma lateral and posterior position (spanning between the neural foramen) extends from the C2 to the T3-T4 level.  Maximal thickness is 4.5 mm.  Neurosurgical consultation recommended.  Baseline cervical spondylotic changes as noted above.  Critical Value/emergent results were called by telephone at the time of interpretation on 07/08/2012 at 9:45 p.m. to Dr. Manus Gunning, who verbally  acknowledged these results.   Original Report Authenticated By: Fuller Canada, M.D.    Dg Chest Portable 1 View  07/08/2012  *RADIOLOGY REPORT*  Clinical Data: Right leg weakness, hypertension  PORTABLE CHEST - 1 VIEW  Comparison: Chest x-ray of 11/23/2003  Findings: The lungs are clear.  Mediastinal contours appear stable. The heart is mildly enlarged and stable.  No bony abnormality is seen.  IMPRESSION: Stable chest x-ray.  No active lung disease.   Original Report Authenticated By: Juline Patch, M.D.     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 She also has a neurogenic bladder 1. Does the need for close, 24 hr/day medical supervision in concert with the patient's rehab needs make it unreasonable for this patient to be served in a less intensive setting? Yes 2. Co-Morbidities requiring supervision/potential complications: Neurogenic bladder, bilateral knee osteoarthritis, bilateral ankle ulcer arthritis 3. Due to bladder management, bowel management, safety, skin/wound care, disease management, medication administration, pain management and patient education, does the patient require 24 hr/day rehab nursing? Yes 4. Does the patient require coordinated care of a physician, rehab nurse, PT (once to hrs/day, 5 days/week) and OT (1-2 hrs/day, 5 days/week) to address physical and functional deficits in the context of the above medical diagnosis(es)? No Addressing deficits in the following areas: balance, endurance, locomotion, strength, transferring, bowel/bladder control, bathing, dressing, feeding, grooming and toileting 5. Can the patient actively participate in an intensive therapy program of at least 3 hrs of therapy per day at least 5 days per week? Yes 6. The potential for patient to make measurable gains while on inpatient rehab is excellent 7. Anticipated functional outcomes upon discharge from inpatient rehab are  Outside independent mobility with PT, Modified independent ADLs with OT, Not applicable with SLP. 8. Estimated rehab length of stay to reach the above functional goals is: 7 days 9. Does the patient have adequate social supports to accommodate these discharge functional goals? Yes 10. Anticipated D/C setting: Home 11. Anticipated post D/C treatments: Outpt therapy 12. Overall Rehab/Functional Prognosis: excellent  RECOMMENDATIONS:  This patient's condition is appropriate for continued rehabilitative care in the following setting: CIR Patient has agreed to participate in recommended program. Yes Note that insurance prior authorization may be required for reimbursement for recommended care.  Comment:    07/10/2012

## 2012-07-11 ENCOUNTER — Inpatient Hospital Stay (HOSPITAL_COMMUNITY)
Admission: RE | Admit: 2012-07-11 | Discharge: 2012-07-19 | DRG: 945 | Disposition: A | Discharge status: Home / Self Care | Payer: 59 | Source: Ambulatory Visit | Attending: Physical Medicine & Rehabilitation | Admitting: Physical Medicine & Rehabilitation

## 2012-07-11 DIAGNOSIS — S24101A Unspecified injury at T1 level of thoracic spinal cord, initial encounter: Secondary | ICD-10-CM

## 2012-07-11 DIAGNOSIS — Y849 Medical procedure, unspecified as the cause of abnormal reaction of the patient, or of later complication, without mention of misadventure at the time of the procedure: Secondary | ICD-10-CM | POA: Diagnosis present

## 2012-07-11 DIAGNOSIS — Z96649 Presence of unspecified artificial hip joint: Secondary | ICD-10-CM

## 2012-07-11 DIAGNOSIS — Z5189 Encounter for other specified aftercare: Principal | ICD-10-CM

## 2012-07-11 DIAGNOSIS — N3289 Other specified disorders of bladder: Secondary | ICD-10-CM | POA: Diagnosis present

## 2012-07-11 DIAGNOSIS — K219 Gastro-esophageal reflux disease without esophagitis: Secondary | ICD-10-CM | POA: Diagnosis present

## 2012-07-11 DIAGNOSIS — Z85820 Personal history of malignant melanoma of skin: Secondary | ICD-10-CM

## 2012-07-11 DIAGNOSIS — G8389 Other specified paralytic syndromes: Secondary | ICD-10-CM | POA: Diagnosis present

## 2012-07-11 DIAGNOSIS — R9431 Abnormal electrocardiogram [ECG] [EKG]: Secondary | ICD-10-CM | POA: Diagnosis present

## 2012-07-11 DIAGNOSIS — IMO0002 Reserved for concepts with insufficient information to code with codable children: Secondary | ICD-10-CM | POA: Diagnosis present

## 2012-07-11 DIAGNOSIS — M1710 Unilateral primary osteoarthritis, unspecified knee: Secondary | ICD-10-CM | POA: Diagnosis present

## 2012-07-11 DIAGNOSIS — K5900 Constipation, unspecified: Secondary | ICD-10-CM | POA: Diagnosis present

## 2012-07-11 DIAGNOSIS — G43909 Migraine, unspecified, not intractable, without status migrainosus: Secondary | ICD-10-CM | POA: Diagnosis present

## 2012-07-11 DIAGNOSIS — E785 Hyperlipidemia, unspecified: Secondary | ICD-10-CM | POA: Diagnosis present

## 2012-07-11 DIAGNOSIS — G4700 Insomnia, unspecified: Secondary | ICD-10-CM | POA: Diagnosis present

## 2012-07-11 DIAGNOSIS — R519 Headache, unspecified: Secondary | ICD-10-CM | POA: Diagnosis present

## 2012-07-11 DIAGNOSIS — R040 Epistaxis: Secondary | ICD-10-CM

## 2012-07-11 LAB — URINALYSIS, ROUTINE W REFLEX MICROSCOPIC
Bilirubin Urine: NEGATIVE
Glucose, UA: NEGATIVE mg/dL
Ketones, ur: NEGATIVE mg/dL
Leukocytes, UA: NEGATIVE
Nitrite: NEGATIVE
Protein, ur: NEGATIVE mg/dL
Specific Gravity, Urine: 1.009 (ref 1.005–1.030)
Urobilinogen, UA: 0.2 mg/dL (ref 0.0–1.0)
pH: 6.5 (ref 5.0–8.0)

## 2012-07-11 LAB — URINE MICROSCOPIC-ADD ON

## 2012-07-11 MED ORDER — PROCHLORPERAZINE 25 MG RE SUPP
12.5000 mg | Freq: Four times a day (QID) | RECTAL | Status: DC | PRN
  Filled 2012-07-11: qty 1

## 2012-07-11 MED ORDER — ACETAMINOPHEN 325 MG PO TABS
325.0000 mg | ORAL_TABLET | ORAL | Status: DC | PRN
  Administered 2012-07-12 – 2012-07-15 (×4): 650 mg via ORAL
  Administered 2012-07-15 (×2): 325 mg via ORAL
  Administered 2012-07-16 (×2): 650 mg via ORAL
  Administered 2012-07-16: 325 mg via ORAL
  Administered 2012-07-17 – 2012-07-18 (×5): 650 mg via ORAL
  Administered 2012-07-19: 325 mg via ORAL
  Administered 2012-07-19: 650 mg via ORAL
  Filled 2012-07-11 (×2): qty 2
  Filled 2012-07-11 (×3): qty 1
  Filled 2012-07-11 (×3): qty 2
  Filled 2012-07-11: qty 1
  Filled 2012-07-11 (×10): qty 2

## 2012-07-11 MED ORDER — PHENOL 1.4 % MT LIQD: 1.0000 | OROMUCOSAL | Status: DC | PRN

## 2012-07-11 MED ORDER — PROCHLORPERAZINE MALEATE 5 MG PO TABS
5.0000 mg | ORAL_TABLET | Freq: Four times a day (QID) | ORAL | Status: DC | PRN
  Administered 2012-07-12 – 2012-07-17 (×5): 5 mg via ORAL
  Filled 2012-07-11 (×5): qty 2

## 2012-07-11 MED ORDER — PANTOPRAZOLE SODIUM 40 MG PO TBEC
40.0000 mg | DELAYED_RELEASE_TABLET | Freq: Every day | ORAL | Status: DC
  Administered 2012-07-12 – 2012-07-18 (×7): 40 mg via ORAL
  Filled 2012-07-11 (×7): qty 1

## 2012-07-11 MED ORDER — LIDOCAINE HCL 2 % EX GEL: CUTANEOUS | Status: DC | PRN

## 2012-07-11 MED ORDER — SENNOSIDES-DOCUSATE SODIUM 8.6-50 MG PO TABS
2.0000 | ORAL_TABLET | Freq: Every day | ORAL | Status: DC
  Administered 2012-07-11: 2 via ORAL
  Filled 2012-07-11: qty 2

## 2012-07-11 MED ORDER — DIPHENHYDRAMINE HCL 25 MG PO CAPS
25.0000 mg | ORAL_CAPSULE | Freq: Every day | ORAL | Status: DC
  Administered 2012-07-11 – 2012-07-18 (×8): 25 mg via ORAL
  Filled 2012-07-11 (×14): qty 1

## 2012-07-11 MED ORDER — DEXAMETHASONE 4 MG PO TABS
4.0000 mg | ORAL_TABLET | Freq: Four times a day (QID) | ORAL | Status: DC
  Administered 2012-07-11 – 2012-07-12 (×2): 4 mg via ORAL
  Filled 2012-07-11 (×6): qty 1

## 2012-07-11 MED ORDER — BUTALBITAL-APAP-CAFFEINE 50-325-40 MG PO TABS
1.0000 | ORAL_TABLET | Freq: Two times a day (BID) | ORAL | Status: DC | PRN
  Administered 2012-07-12 – 2012-07-19 (×7): 1 via ORAL
  Filled 2012-07-11 (×10): qty 1

## 2012-07-11 MED ORDER — PROCHLORPERAZINE EDISYLATE 5 MG/ML IJ SOLN
5.0000 mg | Freq: Four times a day (QID) | INTRAMUSCULAR | Status: DC | PRN
  Filled 2012-07-11: qty 2

## 2012-07-11 MED ORDER — ALUM & MAG HYDROXIDE-SIMETH 200-200-20 MG/5ML PO SUSP
30.0000 mL | Freq: Four times a day (QID) | ORAL | Status: DC | PRN
  Administered 2012-07-17: 30 mL via ORAL
  Filled 2012-07-11 (×2): qty 30

## 2012-07-11 MED ORDER — OXYCODONE HCL 5 MG PO TABS
5.0000 mg | ORAL_TABLET | ORAL | Status: DC | PRN
  Administered 2012-07-11: 10 mg via ORAL
  Administered 2012-07-12 – 2012-07-15 (×18): 5 mg via ORAL
  Filled 2012-07-11: qty 2
  Filled 2012-07-11 (×2): qty 1
  Filled 2012-07-11 (×2): qty 2
  Filled 2012-07-11 (×4): qty 1
  Filled 2012-07-11: qty 2
  Filled 2012-07-11 (×9): qty 1
  Filled 2012-07-11: qty 2
  Filled 2012-07-11: qty 1

## 2012-07-11 MED ORDER — TRAZODONE HCL 50 MG PO TABS
25.0000 mg | ORAL_TABLET | Freq: Every evening | ORAL | Status: DC | PRN
Start: 2012-07-11 — End: 2012-07-19
  Administered 2012-07-12 – 2012-07-16 (×3): 50 mg via ORAL
  Filled 2012-07-11 (×3): qty 1

## 2012-07-11 MED ORDER — MENTHOL 3 MG MT LOZG: 1.0000 | LOZENGE | OROMUCOSAL | Status: DC | PRN

## 2012-07-11 MED ORDER — VITAMIN C 500 MG PO TABS
1000.0000 mg | ORAL_TABLET | Freq: Every day | ORAL | Status: DC
  Administered 2012-07-12 – 2012-07-19 (×8): 1000 mg via ORAL
  Filled 2012-07-11 (×9): qty 2

## 2012-07-11 MED ORDER — ESTROPIPATE 0.75 MG PO TABS
0.7500 mg | ORAL_TABLET | Freq: Every day | ORAL | Status: DC
  Administered 2012-07-12 – 2012-07-19 (×8): 0.75 mg via ORAL
  Filled 2012-07-11 (×9): qty 1

## 2012-07-11 MED ORDER — DIPHENHYDRAMINE HCL 12.5 MG/5ML PO ELIX
12.5000 mg | ORAL_SOLUTION | Freq: Four times a day (QID) | ORAL | Status: DC | PRN
  Administered 2012-07-13: 25 mg via ORAL
  Filled 2012-07-11: qty 10

## 2012-07-11 MED ORDER — GUAIFENESIN-DM 100-10 MG/5ML PO SYRP
5.0000 mL | ORAL_SOLUTION | Freq: Four times a day (QID) | ORAL | Status: DC | PRN
  Administered 2012-07-18 – 2012-07-19 (×2): 10 mL via ORAL
  Filled 2012-07-11 (×2): qty 10

## 2012-07-11 MED ORDER — LORATADINE 10 MG PO TABS
10.0000 mg | ORAL_TABLET | Freq: Every day | ORAL | Status: DC
  Administered 2012-07-12 – 2012-07-19 (×8): 10 mg via ORAL
  Filled 2012-07-11 (×9): qty 1

## 2012-07-11 MED ORDER — BISACODYL 10 MG RE SUPP
10.0000 mg | Freq: Once | RECTAL | Status: AC
  Administered 2012-07-11: 10 mg via RECTAL
  Filled 2012-07-11: qty 1

## 2012-07-11 MED ORDER — PROPRANOLOL HCL 40 MG PO TABS
40.0000 mg | ORAL_TABLET | Freq: Every day | ORAL | Status: DC
  Filled 2012-07-11 (×3): qty 1

## 2012-07-11 MED ORDER — TAMSULOSIN HCL 0.4 MG PO CAPS
0.4000 mg | ORAL_CAPSULE | Freq: Every day | ORAL | Status: DC
  Administered 2012-07-12 – 2012-07-19 (×8): 0.4 mg via ORAL
  Filled 2012-07-11 (×10): qty 1

## 2012-07-11 MED ORDER — SORBITOL 70 % SOLN
45.0000 mL | Freq: Once | Status: AC
  Administered 2012-07-11: 45 mL via ORAL
  Filled 2012-07-11: qty 60

## 2012-07-11 MED ORDER — MEDROXYPROGESTERONE ACETATE 2.5 MG PO TABS
2.5000 mg | ORAL_TABLET | Freq: Every day | ORAL | Status: DC
  Administered 2012-07-12 – 2012-07-19 (×8): 2.5 mg via ORAL
  Filled 2012-07-11 (×9): qty 1

## 2012-07-11 MED ORDER — MUPIROCIN 2 % EX OINT
1.0000 "application " | TOPICAL_OINTMENT | Freq: Two times a day (BID) | CUTANEOUS | Status: DC
  Administered 2012-07-11 – 2012-07-19 (×16): 1 via NASAL
  Filled 2012-07-11: qty 22

## 2012-07-11 NOTE — Progress Notes (Signed)
Have asked insurance to consider pt for CIR.  Await insurance response.  [phone]

## 2012-07-11 NOTE — Progress Notes (Signed)
Physical Therapy Treatment Patient Details Name: Victoria Mosley MRN: [id number] DOB: 24-Apr-1951 Today's Date: 07/11/2012 Time: 4098-1191 PT Time Calculation (min): 39 min  PT Assessment / Plan / Recommendation Comments on Treatment Session  Pt with steady progress today with increased gait distance. Still with weakness in right LE.    Follow Up Recommendations  Home health PT       Equipment Recommendations  Defer to next venue       Frequency Min 5X/week   Plan Discharge plan remains appropriate;Frequency remains appropriate    Precautions / Restrictions Precautions Precautions: Back;Fall Required Braces or Orthoses: Other Brace/Splint Other Brace/Splint: right knee brace with activity    Pertinent Vitals/Pain 5/10 before and after in low back. RN aware and pt was premedicated.    Mobility  Bed Mobility Bed Mobility: Rolling Left;Left Sidelying to Sit;Sit to Sidelying Left;Rolling Right Rolling Right: 4: Min guard Rolling Left: 4: Min assist;With rail Left Sidelying to Sit: 4: Min assist;HOB flat;With rails Sitting - Scoot to Edge of Bed: 4: Min guard Sit to Sidelying Left: 4: Min assist Details for Bed Mobility Assistance: min vc's for logroll technique. assist needed with rolling from back onto left side for trunk transitions, for elevating trunk into sitting postion from lying down and for elevating legs onto bed with lying back down. Transfers Sit to Stand: 4: Min assist;From bed;With upper extremity assist Stand to Sit: 4: Min guard;To bed;With upper extremity assist Details for Transfer Assistance: min vc's for hand placement for saftey with transfers, and assist to complete stand from bed (for ant wt shifting and cues for trunk/hip/knee ext to achieve standing upright). Ambulation/Gait Ambulation/Gait Assistance: 4: Min guard Ambulation Distance (Feet): 150 Feet Assistive device: Rolling walker Ambulation/Gait Assistance Details: cues for posture, walker  postion and to increase bilateral BOS and step length. Pt demo's heel to toe progression with heel strike, however had decreased foot clearance with each step on the right. Gait Pattern: Step-through pattern;Decreased stride length;Decreased step length - left;Decreased stance time - right;Decreased hip/knee flexion - right    Exercises General Exercises - Lower Extremity Ankle Circles/Pumps: AROM;Both;10 reps;Supine Quad Sets: AROM;Strengthening;Right;10 reps;Supine Heel Slides: AROM;Strengthening;Right;10 reps;Supine;Other (comment) (with manual resistance for increased strengthening) Straight Leg Raises: AAROM;Strengthening;Right;10 reps;Supine;Other (comment) (with emphasis on eccentric lowering)    PT Goals Acute Rehab PT Goals PT Goal: Supine/Side to Sit - Progress: Progressing toward goal PT Goal: Sit to Stand - Progress: Progressing toward goal PT Transfer Goal: Bed to Chair/Chair to Bed - Progress: Progressing toward goal PT Goal: Ambulate - Progress: Progressing toward goal  Visit Information  Last PT Received On: 07/11/12 Assistance Needed: +1    Subjective Data  Subjective: No new complaints, agreeable to therapy at this time.   Cognition  Overall Cognitive Status: Appears within functional limits for tasks assessed/performed Arousal/Alertness: Awake/alert Orientation Level: Appears intact for tasks assessed Behavior During Session: [facility] for tasks performed       End of Session PT - End of Session Equipment Utilized During Treatment: Gait belt;Other (comment) (right knee brace) Activity Tolerance: Patient tolerated treatment well Patient left: in bed;with call bell/phone within reach;with family/visitor present;Other (comment) (with Rehab Coordinator in room) Nurse Communication: Mobility status   GP     Sallyanne Kuster 07/11/2012, 10:05 AM  Sallyanne Kuster, PTA Office- [phone]

## 2012-07-11 NOTE — Progress Notes (Signed)
Pt's insurance agrees w /pt d/c to CIR.  Pt's nurse, Morrie Sheldon, notified.  [phone]

## 2012-07-11 NOTE — PMR Pre-admission (Signed)
PMR Admission Coordinator Pre-Admission Assessment  Patient: Victoria Mosley is an 61 y.o., female MRN: [id number] DOB: 02/24/1951 Height: 5\' 7"  (170.2 cm) Weight: 83.6 kg (184 lb 4.9 oz) (bed scale)  Insurance Information HMO:      PPO:       PCP:       IPA:       80/20:       OTHER: yes PRIMARY: [employer]      Policy#: [id number]      Subscriber: pt CM Name: Bertram Denver    Phone#: [phone]        Pre-Cert#: [id number]      Employer: [employer] W[employer] Benefits:  Phone #: [phone]     Name: Abbott Pao. 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 She also has a neurogenic bladder  History of Present Illness: 61 y.o. right-handed female admitted 07/08/2012 after epidural cervical steroid injection 07/08/2012 per Dr. Alvester Morin. Patient noted approximately 1/2 hour to one hour after discharge she noted some weakness of her right leg and increasing neck pain. She returned to the emergency room And MRI of the cervical spine was performed that showed epidural hematoma at the level of C7-T1 compressing and effacing the right side of her spinal cord. Underwent C7, T1, T2 laminectomy evacuation of epidural hematoma per Dr. Danielle Dess 07/09/2012. Pain management ongoing. She is currently on a Decadron protocol. Physical therapy evaluation completed 07/09/2012.       Past Medical History  Past Medical History  Diagnosis Date  . Menopause     sees Dr. Audie Box  . 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 Migraines   . GERD (gastroesophageal reflux disease)   . Allergy   . IBS (irritable bowel syndrome)   . Arthritis   . Hyperlipidemia   . Diverticulitis   . Kidney stones   . Abnormal EKG 2012    stress test normal  . UTI (urinary tract infection)     started atb on 03-24-12  . Cancer   . Bilateral knee pain     sees Dr. Lequita Halt     Family History  family history includes Cancer in her sister; Depression in her father; Diabetes in her mother; Heart disease in her mother and sister; Hypertension in her father, mother, and sister; Leukemia in her father; and Stomach cancer in her paternal grandmother.  There is no history of Colon cancer, and Esophageal cancer, and Rectal cancer, .  Prior Rehab/Hospitalizations: Has had therapy before, but, no Inpatient Rehab   Current Medications  Current facility-administered medications:0.9 %  sodium chloride infusion, , Intravenous, Continuous, Barnett Abu, MD, Last Rate: 75 mL/hr at 07/11/12 0537;  0.9 %  sodium chloride infusion, 250 mL, Intravenous, Continuous, Barnett Abu, MD;  acetaminophen (TYLENOL) suppository 650 mg, 650 mg, Rectal,  Q4H PRN, Barnett Abu, MD;  acetaminophen (TYLENOL) tablet 650 mg, 650 mg, Oral, Q4H PRN, Barnett Abu, MD alum & mag hydroxide-simeth (MAALOX/MYLANTA) 200-200-20 MG/5ML suspension 30 mL, 30 mL, Oral, Q6H PRN, Barnett Abu, MD;  antiseptic oral rinse (BIOTENE) solution 15 mL, 15 mL, Mouth Rinse, BID, Barnett Abu, MD, 15 mL at 07/10/12 0815;  butalbital-acetaminophen-caffeine (FIORICET, ESGIC) 50-325-40 MG per tablet 1 tablet, 1 tablet, Oral, BID PRN, Barnett Abu, MD dexamethasone (DECADRON) injection 4 mg, 4 mg, Intravenous, Q6H, Barnett Abu, MD, 4 mg at 07/11/12 1235;  diazepam (VALIUM) tablet 5 mg, 5 mg, Oral, Q6H PRN, Barnett Abu, MD;  diphenhydrAMINE (BENADRYL) capsule 25 mg, 25 mg, Oral, QHS, Barnett Abu, MD, 25 mg at 07/10/12 2115;  docusate sodium (COLACE) capsule 100  mg, 100 mg, Oral, BID, Barnett Abu, MD, 100 mg at 07/11/12 1052 estropipate (OGEN) tablet 0.75 mg, 0.75 mg, Oral, Daily, Barnett Abu, MD, 0.75 mg at 07/11/12 1051;  influenza  inactive virus vaccine (FLUZONE/FLUARIX) injection 0.5 mL, 0.5 mL, Intramuscular, Tomorrow-1000, Barnett Abu, MD, 0.5 mL at 07/11/12 1052;  loratadine (CLARITIN) tablet 10 mg, 10 mg, Oral, Daily, Barnett Abu, MD, 10 mg at 07/11/12 1051 medroxyPROGESTERone (PROVERA) tablet 2.5 mg, 2.5 mg, Oral, Daily, Barnett Abu, MD, 2.5 mg at 07/11/12 1051;  menthol-cetylpyridinium (CEPACOL) lozenge 3 mg, 1 lozenge, Oral, PRN, Barnett Abu, MD;  morphine 2 MG/ML injection 1-4 mg, 1-4 mg, Intravenous, Q3H PRN, Barnett Abu, MD, 2 mg at 07/10/12 0341;  mupirocin ointment (BACTROBAN) 2 %, , Nasal, BID, Barnett Abu, MD ondansetron [facility]) injection 4 mg, 4 mg, Intravenous, Q4H PRN, Barnett Abu, MD, 4 mg at 07/11/12 1234;  oxyCODONE-acetaminophen (PERCOCET/ROXICET) 5-325 MG per tablet 1-2 tablet, 1-2 tablet, Oral, Q4H PRN, Barnett Abu, MD, 2 tablet at 07/11/12 1229;  pantoprazole (PROTONIX) EC tablet 40 mg, 40 mg, Oral, Q1200, Barnett Abu, MD, 40 mg at 07/11/12 1229;  phenol (CHLORASEPTIC) mouth spray 1 spray, 1 spray, Mouth/Throat, PRN, Barnett Abu, MD propranolol (INDERAL) tablet 40 mg, 40 mg, Oral, Daily, Barnett Abu, MD, 40 mg at 07/10/12 2115;  senna (SENOKOT) tablet 8.6 mg, 1 tablet, Oral, BID, Barnett Abu, MD, 8.6 mg at 07/11/12 1052;  sodium chloride 0.9 % injection 3 mL, 3 mL, Intravenous, Q12H, Barnett Abu, MD, 10 mL at 07/10/12 2116;  sodium chloride 0.9 % injection 3 mL, 3 mL, Intravenous, PRN, Barnett Abu, MD Tamsulosin HCl (FLOMAX) capsule 0.4 mg, 0.4 mg, Oral, QPC breakfast, Barnett Abu, MD, 0.4 mg at 07/11/12 1051;  vitamin C (ASCORBIC ACID) tablet 1,000 mg, 1,000 mg, Oral, Daily, Barnett Abu, MD, 1,000 mg at 07/11/12 1051  Patients Current Diet: General  Precautions / Restrictions Precautions Precautions:  Back;Fall Other Brace/Splint: right knee brace with activity Restrictions Weight Bearing Restrictions: No   Prior Activity Level Community (5-7x/wk):  (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, 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 Assistive Devices/Equipment: Eyeglasses;Brace (specify type) (right knee brace) Home Adaptive Equipment: Bedside commode/3-in-1;Straight cane;Walker - rolling  Prior Functional Level Prior Function Level of Independence: Independent Able to Take Stairs?: Yes Driving: Yes Vocation: Full time employment  Current Functional Level Cognition  Arousal/Alertness: Awake/alert Overall Cognitive Status: Appears within functional limits for tasks assessed/performed Orientation Level: Oriented X4    Extremity Assessment (includes Sensation/Coordination)  RUE ROM/Strength/Tone: Within functional levels RUE Coordination: WFL - gross/fine motor  RLE ROM/Strength/Tone: WFL for tasks assessed    ADLs  Grooming: Performed;Teeth care;Min guard (all items setup on container so no opening required) Where Assessed - Grooming: Supported standing  Lower Body Dressing: Performed;+1 Total assistance Where Assessed - Lower Body Dressing: Supported sitting (husband pulled socks up ) Toilet Transfer: Simulated;Minimal assistance Toilet Transfer Method: Sit to stand Toilet Transfer Equipment: Raised toilet seat with arms (or 3-in-1 over toilet) Equipment Used: Gait belt;Rolling walker Transfers/Ambulation Related to ADLs: Pt completed sit<> stand from chair with BIL UE used and weight shifting  ADL Comments: Pt's husband placing all ADL items on sink level prepared for patient. Pt able to weight shift onto Lt le and pushing sink pedals with Rt LE. Pt c/o pain on Lt scapula area s/p brushing teeth. Pt reports feeling more stable with RW. Pt with BIL knee surg needed and wears brace on Rt LE. Pt with Rt hip replacement. Pt reports hx of back pain     Mobility  Bed Mobility: Rolling  Left;Left Sidelying to Sit;Sit to Sidelying Left;Rolling Right Rolling Right: 4: Min guard Rolling Left: 4: Min assist;With rail Left Sidelying to Sit: 4: Min assist;HOB flat;With rails Sitting - Scoot to Edge of Bed: 4: Min guard Sit to Sidelying Left: 4: Min assist    Transfers  Transfers: Sit to Stand;Stand to Sit Sit to Stand: 4: Min assist;From bed;With upper extremity assist Stand to Sit: 4: Min guard;To bed;With upper extremity assist    Ambulation / Gait / Stairs / Wheelchair Mobility  Ambulation/Gait Ambulation/Gait Assistance: 4: Min guard Ambulation Distance (Feet): 150 Feet Assistive device: Rolling walker Ambulation/Gait Assistance Details: cues for posture, walker postion and to increase bilateral BOS and step length. Pt demo's heel to toe progression with heel strike, however had decreased foot clearance with each step on the right. Gait Pattern: Step-through pattern;Decreased stride length;Decreased step length - left;Decreased stance time - right;Decreased hip/knee flexion - right Stairs: No Wheelchair Mobility Wheelchair Mobility: No    Posture / Balance Static Sitting Balance Static Sitting - Balance Support: Feet supported;Bilateral upper extremity supported Static Sitting - Level of Assistance: 5: Stand by assistance Static Standing Balance Static Standing - Balance Support: Left upper extremity supported;During functional activity Static Standing - Level of Assistance: 5: Stand by assistance Static Standing - Comment/# of Minutes: 4 minutes brushing teeth     Previous Home Environment Living Arrangements: Spouse/significant other Lives With: Spouse Available Help at Discharge: Family Type of Home: House Home Layout: One level Home Access: Other (comment) Bathroom Shower/Tub: Tub/shower unit;Curtain Bathroom Toilet: Standard Bathroom Accessibility: Yes How Accessible: Accessible via walker Home Care Services: No  Discharge Living Setting Plans for  Discharge Living Setting: Patient's home Do you have any problems obtaining your medications?: No  Social/Family/Support Systems Patient Roles: Spouse Contact Information:  [phone]) Anticipated Caregiver:  (husband) Anticipated Caregiver's Contact Information:  (cell [phone]) Ability/Limitations of Caregiver:  (S-Min A) Caregiver Availability: 24/7 Discharge Plan Discussed with Primary Caregiver: Yes Is Caregiver In Agreement with Plan?: Yes Does Caregiver/Family have Issues with Lodging/Transportation while Pt is in Rehab?: No  Goals/Additional Needs Patient/Family Goal for Rehab: Mod I-S Expected length of stay:  (about a week) Pt/Family Agrees to Admission and willing to participate: Yes Program Orientation Provided & Reviewed with Pt/Caregiver Including Roles  & Responsibilities: Yes  Patient Condition: This patient's condition remains as documented in the Consult dated 07/10/12, in which the Rehabilitation Physician determined and documented that the patient's condition is appropriate for intensive rehabilitative care in an inpatient rehabilitation facility.  Preadmission Screen Completed By:  Brock Ra, 07/11/2012 2:17 PM ______________________________________________________________________   Discussed status with Dr. Wynn Banker on 07/11/12 at 2:13pm and received telephone  approval for admission today.  Admission Coordinator:  Brock Ra, time 2:13pm/Date 07/11/12

## 2012-07-11 NOTE — Progress Notes (Signed)
Seeing pt today for possible CIR admit.  [phone]

## 2012-07-12 ENCOUNTER — Inpatient Hospital Stay (HOSPITAL_COMMUNITY): Payer: 59

## 2012-07-12 ENCOUNTER — Inpatient Hospital Stay (HOSPITAL_COMMUNITY): Payer: 59 | Admitting: Physical Therapy

## 2012-07-12 ENCOUNTER — Inpatient Hospital Stay (HOSPITAL_COMMUNITY): Payer: 59 | Admitting: Occupational Therapy

## 2012-07-12 DIAGNOSIS — Z5189 Encounter for other specified aftercare: Secondary | ICD-10-CM

## 2012-07-12 DIAGNOSIS — R040 Epistaxis: Secondary | ICD-10-CM

## 2012-07-12 DIAGNOSIS — S24101A Unspecified injury at T1 level of thoracic spinal cord, initial encounter: Secondary | ICD-10-CM

## 2012-07-12 LAB — COMPREHENSIVE METABOLIC PANEL
ALT: 16 U/L (ref 0–35)
AST: 18 U/L (ref 0–37)
Albumin: 3.3 g/dL — ABNORMAL LOW (ref 3.5–5.2)
Alkaline Phosphatase: 63 U/L (ref 39–117)
BUN: 12 mg/dL (ref 6–23)
CO2: 26 mEq/L (ref 19–32)
Calcium: 9 mg/dL (ref 8.4–10.5)
Chloride: 104 mEq/L (ref 96–112)
Creatinine, Ser: 0.62 mg/dL (ref 0.50–1.10)
GFR calc Af Amer: >90 mL/min (ref >90)
GFR calc non Af Amer: >90 mL/min (ref >90)
Glucose, Bld: 106 mg/dL — ABNORMAL HIGH (ref 70–99)
Potassium: 4 mEq/L (ref 3.5–5.1)
Sodium: 139 mEq/L (ref 135–145)
Total Bilirubin: 0.1 mg/dL — ABNORMAL LOW (ref 0.3–1.2)
Total Protein: 6.2 g/dL (ref 6.0–8.3)

## 2012-07-12 LAB — CBC WITH DIFFERENTIAL/PLATELET
Basophils Absolute: 0 10*3/uL (ref 0.0–0.1)
Basophils Relative: 0 % (ref 0–1)
Eosinophils Absolute: 0 10*3/uL (ref 0.0–0.7)
Eosinophils Relative: 0 % (ref 0–5)
HCT: 38 % (ref 36.0–46.0)
Hemoglobin: 12.8 g/dL (ref 12.0–15.0)
Lymphocytes Relative: 15 % (ref 12–46)
Lymphs Abs: 1.8 10*3/uL (ref 0.7–4.0)
MCH: 30.3 pg (ref 26.0–34.0)
MCHC: 33.7 g/dL (ref 30.0–36.0)
MCV: 89.8 fL (ref 78.0–100.0)
Monocytes Absolute: 1.3 10*3/uL — ABNORMAL HIGH (ref 0.1–1.0)
Monocytes Relative: 12 % (ref 3–12)
Neutro Abs: 8.3 10*3/uL — ABNORMAL HIGH (ref 1.7–7.7)
Neutrophils Relative %: 73 % (ref 43–77)
Platelets: 209 10*3/uL (ref 150–400)
RBC: 4.23 MIL/uL (ref 3.87–5.11)
RDW: 12.8 % (ref 11.5–15.5)
WBC: 11.4 10*3/uL — ABNORMAL HIGH (ref 4.0–10.5)

## 2012-07-12 MED ORDER — BUTALBITAL-APAP-CAFFEINE 50-325-40 MG PO TABS: 1.0000 | ORAL_TABLET | Freq: Two times a day (BID) | ORAL | Status: DC | PRN

## 2012-07-12 MED ORDER — DEXAMETHASONE 4 MG PO TABS
4.0000 mg | ORAL_TABLET | Freq: Every day | ORAL | Status: AC
  Administered 2012-07-16 – 2012-07-17 (×2): 4 mg via ORAL
  Filled 2012-07-12 (×2): qty 1

## 2012-07-12 MED ORDER — DEXAMETHASONE 4 MG PO TABS
4.0000 mg | ORAL_TABLET | Freq: Two times a day (BID) | ORAL | Status: AC
  Administered 2012-07-14 – 2012-07-15 (×3): 4 mg via ORAL
  Filled 2012-07-12 (×4): qty 1

## 2012-07-12 MED ORDER — POLYETHYLENE GLYCOL 3350 17 G PO PACK
17.0000 g | PACK | Freq: Two times a day (BID) | ORAL | Status: DC
  Administered 2012-07-12 – 2012-07-16 (×10): 17 g via ORAL
  Filled 2012-07-12 (×13): qty 1

## 2012-07-12 MED ORDER — DEXAMETHASONE 4 MG PO TABS
4.0000 mg | ORAL_TABLET | Freq: Three times a day (TID) | ORAL | Status: AC
  Administered 2012-07-12 – 2012-07-14 (×6): 4 mg via ORAL
  Filled 2012-07-12 (×6): qty 1

## 2012-07-12 MED ORDER — PROPRANOLOL HCL 40 MG PO TABS
40.0000 mg | ORAL_TABLET | Freq: Every day | ORAL | Status: DC
  Administered 2012-07-12 – 2012-07-18 (×7): 40 mg via ORAL
  Filled 2012-07-12 (×8): qty 1

## 2012-07-12 NOTE — Progress Notes (Signed)
Social Work Assessment and Plan Social Work Assessment and Plan  Patient Details  Name: Victoria Mosley MRN: [id number] Date of Birth: 1951/05/22  Today's Date: 07/12/2012  Problem List:  Patient Active Problem List  Diagnosis  . HYPERLIPIDEMIA  . COMMON MIGRAINE  . ALLERGIC RHINITIS  . GERD  . DIVERTICULITIS, COLON  . OSTEOARTHRITIS  . NEPHROLITHIASIS, HX OF  . IBS (irritable bowel syndrome)  . Traumatic epidural hematoma   Past Medical History:  Past Medical History  Diagnosis Date  . Menopause     sees Dr. Audie Box  . 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 Migraines   . GERD (gastroesophageal reflux disease)   . Allergy   . IBS (irritable bowel syndrome)   . Arthritis   . Hyperlipidemia   . Diverticulitis   . Kidney stones   . Abnormal EKG 2012    stress test normal  . UTI (urinary tract infection)     started atb on 03-24-12  . Cancer   . Bilateral knee pain     sees Dr. Lequita Halt    Past Surgical History:  Past Surgical History  Procedure Date  . Melanoma excision 2007    left upper arm per dr. Lonni Fix  . Foot surgery 1987    per Dr. Chaney Malling  . Nasal septum surgery 1992    per Dr. Berna Bue  . Lithotripsy 2007  . Lumbar laminectomy 1993    1993, at L5 per Dr. Roxan Hockey  . Colonoscopy 03-28-12    per Dr. Marina Goodell, polyp,  repeat in 5 yrs  . Cesarean section   . Tubal ligation   . Kidney stone surgery 2005    per Dr. Wanda Plump  . Total hip arthroplasty 2010    on right per Dr. Lequita Halt  . Posterior cervical laminectomy 07/08/2012    Procedure: POSTERIOR CERVICAL LAMINECTOMY;  Surgeon: Barnett Abu, MD;  Location: [facility];  Service: Neurosurgery;  Laterality: N/A;  Cervical Seven- thoracic One Laminectomy and Decompression of Epidural Hematoma  . Hematoma evacuation 07/08/2012    Procedure: EVACUATION HEMATOMA;  Surgeon: Barnett Abu, MD;  Location: [facility];  Service: Neurosurgery;  Laterality: N/A;  Cervical Seven-thoracic One Laminectomy  and Decompression of Epidural Hematoma   Social History:  reports that she has quit smoking. She has never used smokeless tobacco. She reports that she does not drink alcohol or use illicit drugs.  Family / Support Systems Marital Status: Married Patient Roles: Spouse;Parent;Other (Comment) Programmer, multimedia) Spouse/Significant Other: Steven-[phone]-home  [phone]-cell Children: Son Other Supports: Friend and Co-workers Anticipated Caregiver: Husband Ability/Limitations of Caregiver: No issues Caregiver Availability: 24/7 Family Dynamics: Close knit small family.  Pt is very motivated and wants to improve and do the best she can while here and at home.  Social History Preferred language: English Religion: Baptist Cultural Background: No issues Education: [employer] Read: Yes Write: Yes Employment Status: Employed Name of Employer: [employer] Return to Work Plans: Plans to return in 8 weeks-according to MD recoemmendations Legal Hisotry/Current Legal Issues: No issues Guardian/Conservator: None-accroding to MD pt is capable of making her own decisions   Abuse/Neglect Physical Abuse: Denies Verbal Abuse: Denies Sexual Abuse: Denies Exploitation of patient/patient's resources: Denies Self-Neglect: Denies  Emotional Status Pt's affect, behavior adn adjustment status: Pt is moitvated but did not seleep last night and is having a rough am.  Meds are now fixed and she should have a better night tonight.  She is optimistic and encouraged  by the progress she has already made. Her next hope is her bladder will begin to work on it's own. Recent Psychosocial Issues: Other medical issues Pyschiatric History: History of anxiety takes meds for at times.  She feels this helps.  Depression screen score-2.  Pt is coping appropriately with all that has happened to her.  Will continue to monitor her coping while here. Substance Abuse History: No issues  Patient / Family Perceptions, Expectations &  Goals Pt/Family understanding of illness & functional limitations: Pt has a good understanding of her condition.  She is hopeful she will continue to progress and her bladder will begin to work on her own.  She is discussing with MD daily on his rounds. Premorbid pt/family roles/activities: Wife, Mother, Employee, Friend, Home owner, Church member, etc Anticipated changes in roles/activities/participation: Resume Pt/family expectations/goals: Pt states: " I am so grateful I have recovered like I have, now only my bladder."  Husband states: " We are hopeful she will do well here."  [employer]: None Premorbid Home Care/DME Agencies: None Transportation available at discharge: Family  Discharge Planning Living Arrangements: Spouse/significant other Support Systems: Spouse/significant other;Children;Friends/neighbors;Church/faith community Type of Residence: Private residence Insurance Resources: Media planner (specify) Education officer, museum) Financial Resources: Employment Surveyor, quantity Screen Referred: No Living Expenses: Lives with family Money Management: Patient;Spouse Do you have any problems obtaining your medications?: No Home Management: Both-patient and husband Patient/Family Preliminary Plans: Return home with husband assisting if necessary, he is willing to assist her. Social Work Anticipated Follow Up Needs: HH/OP;Support Group DC Planning Additional Notes/Comments: Short length of stay due to pt's high level  Clinical Impression Pleasant female who is motivated and has been through multiple health issues, but this is the worse.  She has very supportive family and a good support systems.   Lucy Chris 07/12/2012, 12:21 PM

## 2012-07-12 NOTE — Plan of Care (Signed)
Overall Plan of Care [facility]) Patient Details Name: Victoria Mosley MRN: [id number] DOB: 14-Dec-1950  Diagnosis:    Primary Diagnosis:    Traumatic epidural hematoma Co-morbidities: neurogenic bladder, chronic headaches  Functional Problem List  Patient demonstrates impairments in the following areas: Balance, Bladder, Bowel, Endurance, Motor, Pain, Safety, Sensory  and Skin Integrity  Basic ADL's: grooming, bathing, dressing and toileting Advanced ADL's: n/a at this time.  Transfers:  bed mobility, bed to chair, toilet, tub/shower, car and furniture Locomotion:  ambulation and stairs  Additional Impairments:  Leisure Awareness  Anticipated Outcomes Item Anticipated Outcome  Eating/Swallowing    Basic self-care  Modified Independent  Tolieting  Modified Independent  Bowel/Bladder  Continent of bowel and bladder  Transfers  Modified indpendent  Locomotion  Modified independent with gait, S stairs and in community  Communication    Cognition    Pain  < or = 3  Safety/Judgment    Other     Therapy Plan: PT Frequency: 1-2 X/day, 60-90 minutes OT Frequency: 1-2 X/day, 60-90 minutes     Team Interventions: Item RN PT OT SLP SW TR Other  Self Care/Advanced ADL Retraining  x x      Neuromuscular Re-Education  x x      Therapeutic Activities  x x      UE/LE Strength Training/ROM  x x      UE/LE Coordination Activities  x x      Visual/Perceptual Remediation/Compensation         DME/Adaptive Equipment Instruction  x x      Therapeutic Exercise  x x      Balance/Vestibular Training  x x      Patient/Family Education x x x      Cognitive Remediation/Compensation         Functional Mobility Training  x x      Ambulation/Gait Training  x x      Stair Training  x       Wheelchair Propulsion/Positioning  x x      Functional Tourist information centre manager Reintegration  x x      Dysphagia/Aspiration Film/video editor          Bladder Management x        Bowel Management x        Disease Management/Prevention x x       Pain Management x x x      Medication Management x        Skin Care/Wound Management  x x      Splinting/Orthotics  x x      Discharge Planning x x x      Psychosocial Support  x x                         Team Discharge Planning: Destination:  Home Projected Follow-up:  PT, Home Health and OT: TBD Projected Equipment Needs:  Bedside Commode and Tub Bench AD for gait TBD Patient/family involved in discharge planning:  Yes  MD ELOS: 5-7 days Medical Rehab Prognosis:  Excellent Assessment: Pt admitted for CIR therapies. The team will be addressing self-care, safety, strength, NMR, pain. Goals are MOD I.

## 2012-07-12 NOTE — Evaluation (Signed)
Occupational Therapy Assessment and Plan & Session Note  Patient Details  Name: Victoria Mosley MRN: [id number] Date of Birth: 26-Dec-1950  OT Diagnosis: acute pain, lumbago (low back pain) and muscle weakness (generalized) Rehab Potential: Rehab Potential: Good ELOS: 7-10 days   Today's Date: 07/12/2012  Problem List:  Patient Active Problem List  Diagnosis  . HYPERLIPIDEMIA  . COMMON MIGRAINE  . ALLERGIC RHINITIS  . GERD  . DIVERTICULITIS, COLON  . OSTEOARTHRITIS  . NEPHROLITHIASIS, HX OF  . IBS (irritable bowel syndrome)  . Traumatic epidural hematoma    Past Medical History:  Past Medical History  Diagnosis Date  . Menopause     sees Dr. Audie Box  . 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 Migraines   . GERD (gastroesophageal reflux disease)   . Allergy   . IBS (irritable bowel syndrome)   . Arthritis   . Hyperlipidemia   . Diverticulitis   . Kidney stones   . Abnormal EKG 2012    stress test normal  . UTI (urinary tract infection)     started atb on 03-24-12  . Cancer   . Bilateral knee pain     sees Dr. Lequita Halt    Past Surgical History:  Past Surgical History  Procedure Date  . Melanoma excision 2007    left upper arm per dr. Lonni Fix  . Foot surgery 1987    per Dr. Chaney Malling  . Nasal septum surgery 1992    per Dr. Berna Bue  . Lithotripsy 2007  . Lumbar laminectomy 1993    1993, at L5 per Dr. Roxan Hockey  . Colonoscopy 03-28-12    per Dr. Marina Goodell, polyp,  repeat in 5 yrs  . Cesarean section   . Tubal ligation   . Kidney stone surgery 2005    per Dr. Wanda Plump  . Total hip arthroplasty 2010    on right per Dr. Lequita Halt  . Posterior cervical laminectomy 07/08/2012    Procedure: POSTERIOR CERVICAL LAMINECTOMY;  Surgeon: Barnett Abu, MD;  Location: [facility];  Service: Neurosurgery;  Laterality: N/A;  Cervical Seven- thoracic One Laminectomy and Decompression of Epidural Hematoma  . Hematoma evacuation 07/08/2012    Procedure: EVACUATION  HEMATOMA;  Surgeon: Barnett Abu, MD;  Location: [facility];  Service: Neurosurgery;  Laterality: N/A;  Cervical Seven-thoracic One Laminectomy and Decompression of Epidural Hematoma   Clinical Impression: Victoria Mosley is a 61 y.o. right-handed female admitted 07/08/2012 after epidural cervical steroid injection 07/08/2012 per Dr. Alvester Morin. Patient noted approximately 1/2 hour to one hour after discharge she noted some weakness of her right leg and increasing neck pain. She returned to the emergency room And MRI of the cervical spine was performed that showed epidural hematoma at the level of C7-T1 compressing and effacing the right side of her spinal cord. She underwent C7, T1, T2 laminectomy evacuation of epidural hematoma per Dr. Danielle Dess 07/09/2012. Post op foley placed for bladder volume >300cc, decreased urine ouput and reports of hesitancy. Therapies ongoing and CIR recommended for progression. Patient transferred to CIR on 07/11/2012 .    Patient currently requires min with basic self-care skills secondary to muscle weakness and decreased sitting balance, decreased standing balance, decreased postural control and decreased balance strategies.  Prior to hospitalization, patient could complete BADLs independently.   Patient will benefit from skilled intervention to increase independence with basic self-care skills prior to discharge home with care partner.  Anticipate patient will require intermittent supervision and  additional occupational therapy is TBD.  OT - End of Session Activity Tolerance: Tolerates 10 - 20 min activity with multiple rests Endurance Deficit: Yes OT Assessment Rehab Potential: Good Barriers to Discharge: None (none known) OT Plan OT Frequency: 1-2 X/day, 60-90 minutes Estimated Length of Stay: 7-10 days OT Treatment/Interventions: Balance/vestibular training;Community reintegration;Discharge planning;DME/adaptive equipment instruction;Functional mobility  training;Neuromuscular re-education;Pain management;Patient/family education;Psychosocial support;Self Care/advanced ADL retraining;Skin care/wound managment;Splinting/orthotics;Therapeutic Activities;Therapeutic Exercise;UE/LE Strength taining/ROM;UE/LE Coordination activities;Wheelchair propulsion/positioning OT Recommendation Follow Up Recommendations:  (OT: TBD) Equipment Recommended:  (AD TBD; may have SPC and RW at home (husband to bring))  Precautions/Restrictions  Precautions Precautions: Back;Fall Required Braces or Orthoses: Other Brace/Splint Other Brace/Splint: right knee brace  Restrictions Weight Bearing Restrictions: No  General Chart Reviewed: Yes Family/Caregiver Present: No  Pain Pain Assessment Pain Assessment: 0-10 Pain Score:   3 Pain Type: Acute pain Pain Location: Head Pain Orientation: Anterior Pain Descriptors: Heaviness Patients Stated Pain Goal: 3 Pain Intervention(s): RN made aware  Home Living/Prior Functioning Home Living Lives With: Spouse Available Help at Discharge: Family Type of Home: House Home Access: Stairs to enter Secretary/administrator of Steps: one threshold to enter Home Layout: One level Bathroom Shower/Tub: Forensic scientist: Standard Bathroom Accessibility: Yes How Accessible: Accessible via walker Home Adaptive Equipment: Bedside commode/3-in-1;Straight cane;Reacher;Walker - rolling IADL History Homemaking Responsibilities: No Current License: Yes Occupation: Full time employment 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; Clerical work  M-F 8-5 Prior Function Level of Independence: Independent with basic ADLs;Independent with gait;Independent with transfers Able to Take Stairs?: Yes (down, not up unless she had too) Driving: Yes Vocation: Full time employment  ADL - See FIM  Vision/Perception  Vision - History Baseline Vision: Wears glasses only for reading Patient Visual Report: No  change from baseline Perception Perception: Within Functional Limits Praxis Praxis: Intact   Cognition Overall Cognitive Status: Appears within functional limits for tasks assessed Arousal/Alertness: Awake/alert Orientation Level: Oriented X4 Memory: Appears intact Awareness: Appears intact Problem Solving: Appears intact Safety/Judgment: Appears intact  Sensation Sensation Light Touch: Appears Intact (reports LT was cold to RLE yest but feels normal today) Additional Comments: Bilateral UEs appear intact Coordination Gross Motor Movements are Fluid and Coordinated: Yes Fine Motor Movements are Fluid and Coordinated: Yes  Motor  Motor Motor: Within Functional Limits  Trunk/Postural Assessment  Cervical Assessment Cervical Assessment: Within Functional Limits Thoracic Assessment Thoracic Assessment: Within Functional Limits Lumbar Assessment Lumbar Assessment: Within Functional Limits (within limits of back precautions from prior sx) Postural Control Postural Control: Within Functional Limits   Balance Balance Balance Assessed: Yes Static Sitting Balance Static Sitting - Level of Assistance: 6: Modified independent (Device/Increase time) Dynamic Sitting Balance Dynamic Sitting - Level of Assistance: 5: Stand by assistance Static Standing Balance Static Standing - Level of Assistance: 5: Stand by assistance Dynamic Standing Balance Dynamic Standing - Level of Assistance: 4: Min assist  Extremity/Trunk Assessment RUE Assessment RUE Assessment: Within Functional Limits (can benefit from UE strengthening) LUE Assessment LUE Assessment: Within Functional Limits (Can benefit from UE strengthening)  See FIM for current functional status  Refer to Care Plan for Long Term Goals  Recommendations for other services: None  Discharge Criteria: Patient will be discharged from OT if patient refuses treatment 3 consecutive times without medical reason, if treatment goals  not met, if there is a change in medical status, if patient makes no progress towards goals or if patient is discharged from hospital.  The above assessment, treatment plan, treatment alternatives and goals were discussed  and mutually agreed upon: by patient  ----------------------------------------------------------------------------------------------------  SESSION NOTE  0930-1030 - 60 Minutes Individual Therapy Patient with 8/10 complaints of a headache Initial 1:1 occupational therapy evaluation completed. Focused skilled intervention on bed mobility, sit/stands, functional ambulation around room using rolling walker, dynamic standing balance/tolerance/endurance, and toilet transfer. Patient refused bathing & dressing at this time, but stated she would be willing to complete later. Patient also stated she recently took a shower while on the other floor/unit. Therapist discussed OT goals with patient, patient agreeable. At end of session left patient seated on [facility] with call bell within reach. Patient understands not to get up without assistance.   Kallyn Demarcus 07/12/2012, 12:06 PM

## 2012-07-12 NOTE — Discharge Summary (Signed)
Physician Discharge Summary  Patient ID: Victoria Mosley MRN: [id number] DOB/AGE: 1950-11-10 61 y.o.  Admit date: 07/08/2012 Discharge date: 07/12/2012  Admission Diagnoses: Epidural hematoma cervical spine status post epidural steroid injection, paraplegia  Discharge Diagnoses: Epidural hematoma of cervical spine status post epidural steroid injection, paraplegia Active Problems:  * No active hospital problems. *    Discharged Condition: fair  Hospital Course: Patient was admitted to undergo surgical treatment emergently for an epidural hematoma. The patient earlier in the day had had an epidural steroid injection in the cervical spine a few hours later she developed plegia of her right lower extremity. She also had severe pain. An emergent MRI demonstrated the presence of a sizable epidural hematoma at the site of injection C7-T1. She was emergently taken to the operating room where she underwent surgical decompression. She tolerated the procedure well and has been making a good recovery. Further inpatient rehabilitation is mandated during this postoperative period.  Consults: rehabilitation medicine  Significant Diagnostic Studies: MRI of cervical spine  Treatments: surgery: C7-T1 laminectomy evacuation of hematoma T1-T2 laminectomy evacuation of hematoma  Discharge Exam: Blood pressure 117/72, pulse 54, temperature 98.4 F (36.9 C), temperature source Oral, resp. rate 18, height 5\' 7"  (1.702 m), weight 83.6 kg (184 lb 4.9 oz), SpO2 98.00%. Incision/Wound: incision is clean and dry. Motor function reveals 3/5 strength in iliopsoas and quadriceps 4-5 strength in dorsi and plantar flexor there is increased tone in the lower extremity  Disposition: 62-Rehab Facility  Discharge Orders    Future Appointments: Provider: Department: Dept Phone: Center:   07/12/2012 9:30 AM Coralyn Mark, OT [facility] [phone] None   07/12/2012 10:30 AM Philip Aspen, PT [facility]  [phone] None   07/12/2012 1:30 PM Rich Brave, OTA [facility] [phone] None   07/12/2012 2:30 PM Thereasa Parkin, PT [facility] [phone] None       Medication List     As of 07/12/2012  7:56 AM    TAKE these medications         aspirin 81 MG tablet   Take 81 mg by mouth daily.      aspirin-acetaminophen-caffeine 250-250-65 MG per tablet   Commonly known as: EXCEDRIN MIGRAINE   Take 1 tablet by mouth every 6 (six) hours as needed. For migraine      butalbital-acetaminophen-caffeine 50-325-40 MG per tablet   Commonly known as: FIORICET, ESGIC   Take 1 tablet by mouth 2 (two) times daily as needed. For migraine      Calcium-Vitamin D 600-200 MG-UNIT per tablet   Take 1 tablet by mouth 2 (two) times daily.      dimenhyDRINATE 50 MG tablet   Commonly known as: DRAMAMINE   Take 50 mg by mouth every 8 (eight) hours as needed. For nausea      diphenhydrAMINE 25 mg capsule   Commonly known as: BENADRYL   Take 25 mg by mouth at bedtime.      estropipate 0.75 MG tablet   Commonly known as: OGEN   Take 1 tablet (0.75 mg total) by mouth daily.      Flax Oil   Take 1 each by mouth 2 (two) times daily.      ibuprofen 800 MG tablet   Commonly known as: ADVIL,MOTRIN   Take 800 mg by mouth every 8 (eight) hours as needed. For pain      loratadine 10 MG tablet   Commonly known as: CLARITIN  Take 10 mg by mouth daily. For allergies        medroxyPROGESTERone 2.5 MG tablet   Commonly known as: PROVERA   Take 1 tablet (2.5 mg total) by mouth daily.      naproxen sodium 220 MG tablet   Commonly known as: ANAPROX   Take 220 mg by mouth every morning. Arthritis        omeprazole 20 MG capsule   Commonly known as: PRILOSEC   Take 20 mg by mouth daily.      ONE-A-DAY WOMENS 50 PLUS PO   Take 1 each by mouth daily.      OVER THE COUNTER MEDICATION   Take 1 capsule by mouth 2 (two) times daily. Turmeric      Potassium Gluconate 595 MG  Caps   Take 1 each by mouth daily.      predniSONE 10 MG tablet   Commonly known as: DELTASONE   Take 1 tablet (10 mg total) by mouth as needed. For mouth ulcers      propranolol 40 MG tablet   Commonly known as: INDERAL   Take 1 tablet (40 mg total) by mouth daily.      VITAMIN B COMPLEX PO   Take 1 each by mouth daily.      vitamin C 500 MG tablet   Commonly known as: ASCORBIC ACID   Take 1,000 mg by mouth daily.         SignedStefani Dama 07/12/2012, 7:56 AM

## 2012-07-12 NOTE — Progress Notes (Signed)
Physical Therapy Session Note  Patient Details  Name: Victoria Mosley MRN: [id number] Date of Birth: 1951/03/21  Today's Date: 07/12/2012 Time: 7829-5621 Time Calculation (min): 44 min  Short Term Goals: Week 1:  PT Short Term Goal 1 (Week 1): =LTGS  Skilled Therapeutic Interventions/Progress Updates:  Ambulation 2 x 120' with RW with min-guard assist. Pt with very blocked and guarded trunk movement. Alternating foot taps working on single limb stance x 2 bouts to fatigue. Varied bases of support (normal stance, narrow base, bil. modified tandem) while standing on compliant surface + ball toss for distraction x 3 bouts to fatigue. Attempted step ups/down for strengthening however pt did not tolerate well secondary to chronic knee pain (did not rate pain). Pt fatigued from first therapy day and required frequent rest breaks but overall doing very well.   Therapy Documentation Precautions:  Precautions Precautions: Back;Fall Required Braces or Orthoses: Other Brace/Splint Other Brace/Splint: right knee brace  Restrictions Weight Bearing Restrictions: No Pain: Pain Assessment Pain Assessment: 0-10 Pain Score:   4 Pain Type: Acute pain Pain Location: Neck Pain Orientation: Posterior Pain Descriptors: Aching Pain Onset: On-going Patients Stated Pain Goal: 2 Pain Intervention(s): Other (Comment) (Pt received medication at start of treatment. ) Locomotion : Ambulation Ambulation/Gait Assistance: 4: Min guard   See FIM for current functional status  Therapy/Group: Individual Therapy  Wilhemina Bonito 07/12/2012, 5:21 PM

## 2012-07-12 NOTE — Progress Notes (Signed)
Patient received to room 4007 from 3000 at 2030. Alert and oriented x4. Pain level 5 out 10 pain meds adminstered. Patient oriented to unit, call bell system, rehab schedule, and safety plan. Patient verbalized understanding.

## 2012-07-12 NOTE — Care Management Note (Signed)
[facility]patient Rehabilitation Center Individual Statement of Services  Patient Name:  Victoria Mosley  Date:  07/12/2012  Welcome to the Inpatient [facility].  Our goal is to provide you with an individualized program based on your diagnosis and situation, designed to meet your specific needs.  With this comprehensive rehabilitation program, you will be expected to participate in at least 3 hours of rehabilitation therapies Monday-Friday, with modified therapy programming on the weekends.  Your rehabilitation program will include the following services:  Physical Therapy (PT), Occupational Therapy (OT), 24 hour per day rehabilitation nursing, Therapeutic Recreaction (TR), Neuropsychology, Case Management (RN and Social Worker), Rehabilitation Medicine, Nutrition Services and Pharmacy Services  Weekly team conferences will be held on Tuesday  to discuss your progress.  Your RN Case Designer, television/film set will talk with you frequently to get your input and to update you on team discussions.  Team conferences with you and your family in attendance may also be held.  Expected length of stay: 7-10 days Overall anticipated outcome: supervision/mod/i level  Depending on your progress and recovery, your program may change.  Your RN Case Estate agent will coordinate services and will keep you informed of any changes.  Your RN Sports coach and SW names and contact numbers are listed  below.  The following services may also be recommended but are not provided by the Inpatient Rehabilitation Center:   Driving Evaluations  Home Health Rehabiltiation Services  Outpatient Rehabilitatation [facility]  Vocational Rehabilitation   Arrangements will be made to provide these services after discharge if needed.  Arrangements include referral to agencies that provide these services.  Your insurance has been verified to be:  [facility] Your primary doctor is:  Dr Gershon Crane  Pertinent  information will be shared with your doctor and your insurance company.    Social Worker:  Dossie Der, [place] [phone]  Information discussed with and copy given to patient by: Lucy Chris, 07/12/2012, 9:32 AM

## 2012-07-12 NOTE — Progress Notes (Signed)
Subjective/Complaints: Having a migraine headache. Waiting on fioricet. Didn't get inderal last night like she usually takes at home Pleased with neuro progress otherwise. A 12 point review of systems has been performed and if not noted above is otherwise negative.   Objective: Vital Signs: Blood pressure 128/64, pulse 57, temperature 97.6 F (36.4 C), temperature source Oral, resp. rate 18, SpO2 98.00%. No results found.  Basename 07/12/12 0600  WBC 11.4*  HGB 12.8  HCT 38.0  PLT 209   No results found for this basename: NA:2,K:2,CL:2,CO2:2,GLUCOSE:2,BUN:2,CREATININE:2,CALCIUM:2 in the last 72 hours CBG (last 3)  No results found for this basename: GLUCAP:3 in the last 72 hours  Wt Readings from Last 3 Encounters:  07/10/12 83.6 kg (184 lb 4.9 oz)  07/08/12 77.111 kg (170 lb)  07/10/12 83.6 kg (184 lb 4.9 oz)    Physical Exam:  Nursing note and vitals reviewed.  Constitutional: She is oriented to person, place, and time.  Holding damp rag over eyes, forehead HENT:  Head: Normocephalic and atraumatic.  Eyes: Pupils are equal, round, and reactive to light.  Neck: Normal range of motion.  Cardiovascular: Normal rate and regular rhythm.  Pulmonary/Chest: Effort normal and breath sounds normal.  Abdominal: Soft. Bowel sounds are normal. She exhibits no distension. There is no tenderness.  Musculoskeletal: She exhibits no edema and no tenderness.  Neurological: She is alert and oriented to person, place, and time.  Continues with weakness in right knee flexion and PF. Able to lift right leg agst gravity. Left lower ext 3-4. ue's 4/5.  Skin: Skin is warm and dry.  Psychiatric: flat affect but generally pleasant and cooperative.   Assessment/Plan: 1. Functional deficits secondary to C7-T1 EDH s/p lami, decompression which require 3+ hours per day of interdisciplinary therapy in a comprehensive inpatient rehab setting. Physiatrist is providing close team supervision and 24 hour  management of active medical problems listed below. Physiatrist and rehab team continue to assess barriers to discharge/monitor patient progress toward functional and medical goals. FIM:                   Comprehension Comprehension Mode: Auditory Comprehension: 7-Follows complex conversation/direction: With no assist  Expression Expression Mode: Verbal Expression: 7-Expresses complex ideas: With no assist  Social Interaction Social Interaction: 7-Interacts appropriately with others - No medications needed.  Problem Solving Problem Solving: 7-Solves complex problems: Recognizes & self-corrects  Memory Memory: 7-Complete Independence: No helper  Medical Problem List and Plan:  1. DVT Prophylaxis/Anticoagulation: Mechanical: Sequential compression devices, below knee Bilateral lower extremities  2. Pain Management: prn medications effective.  3. Mood: a little anxious but motivated. Will have LCSW follow up for formal evaluation.  4. Neuropsych: This patient is capable of making decisions on his/her own behalf.  5. Hyperactive spastic bladder: continue flomax. Dc foley today once headache better. 6. Acute on chronic constipation: Change senna to miralax. Suppository with results. Needs further clean ot. 7. Migraines: Continue propranolol with prn Fioricet.   LOS (Days) 1 A FACE TO FACE EVALUATION WAS PERFORMED  Biran Mayberry T 07/12/2012, 7:05 AM

## 2012-07-12 NOTE — Evaluation (Addendum)
Physical Therapy Assessment and Plan  Patient Details  Name: Victoria Mosley MRN: [id number] Date of Birth: [birth year]  PT Diagnosis: Abnormality of gait, Difficulty walking, Impaired sensation, Low back pain and Muscle weakness Rehab Potential: Excellent ELOS: 5-7 days   Today's Date: 07/12/2012  Problem List:  Patient Active Problem List  Diagnosis  . HYPERLIPIDEMIA  . COMMON MIGRAINE  . ALLERGIC RHINITIS  . GERD  . DIVERTICULITIS, COLON  . OSTEOARTHRITIS  . NEPHROLITHIASIS, HX OF  . IBS (irritable bowel syndrome)  . Traumatic epidural hematoma    Past Medical History:  Past Medical History  Diagnosis Date  . Menopause     sees Dr. Audie Box  . 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 Migraines   . GERD (gastroesophageal reflux disease)   . Allergy   . IBS (irritable bowel syndrome)   . Arthritis   . Hyperlipidemia   . Diverticulitis   . Kidney stones   . Abnormal EKG 2012    stress test normal  . UTI (urinary tract infection)     started atb on 03-24-12  . Cancer   . Bilateral knee pain     sees Dr. Lequita Halt    Past Surgical History:  Past Surgical History  Procedure Date  . Melanoma excision 2007    left upper arm per dr. Lonni Fix  . Foot surgery 1987    per Dr. Chaney Malling  . Nasal septum surgery 1992    per Dr. Berna Bue  . Lithotripsy 2007  . Lumbar laminectomy 1993    1993, at L5 per Dr. Roxan Hockey  . Colonoscopy 03-28-12    per Dr. Marina Goodell, polyp,  repeat in 5 yrs  . Cesarean section   . Tubal ligation   . Kidney stone surgery 2005    per Dr. Wanda Plump  . Total hip arthroplasty 2010    on right per Dr. Lequita Halt  . Posterior cervical laminectomy 07/08/2012    Procedure: POSTERIOR CERVICAL LAMINECTOMY;  Surgeon: Barnett Abu, MD;  Location: [facility];  Service: Neurosurgery;  Laterality: N/A;  Cervical Seven- thoracic One Laminectomy and Decompression of Epidural Hematoma  . Hematoma evacuation 07/08/2012    Procedure: EVACUATION HEMATOMA;   Surgeon: Barnett Abu, MD;  Location: [facility];  Service: Neurosurgery;  Laterality: N/A;  Cervical Seven-thoracic One Laminectomy and Decompression of Epidural Hematoma    Assessment & Plan Clinical Impression: Patient is a 61 y.o. year old female with recent admission to the hospital on 07/08/2012 after epidural cervical steroid injection 07/08/2012 per Dr. Alvester Morin. Patient noted approximately 1/2 hour to one hour after discharge she noted some weakness of her right leg and increasing neck pain. She returned to the emergency room And MRI of the cervical spine was performed that showed epidural hematoma at the level of C7-T1 compressing and effacing the right side of her spinal cord. She underwent C7, T1, T2 laminectomy evacuation of epidural hematoma per Dr. Danielle Dess 07/09/2012. Post op foley placed for bladder volume >300cc, decreased urine ouput and reports of hesitancy. Patient transferred to CIR on 07/11/2012 .   Patient currently requires min with mobility secondary to muscle weakness and decreased standing balance and decreased balance strategies.  Prior to hospitalization, patient was independent with mobility and lived with Spouse in a House home.  Home access is one threshold to enterStairs to enter.  Patient will benefit from skilled PT intervention to maximize safe functional mobility, minimize fall risk and decrease caregiver burden for  planned discharge home with 24 hour supervision.  Anticipate patient will benefit from follow up OP at discharge.  PT - End of Session Endurance Deficit: Yes PT Assessment Rehab Potential: Excellent Barriers to Discharge: None PT Plan PT Frequency: 1-2 X/day, 60-90 minutes Estimated Length of Stay: 5-7 days PT Treatment/Interventions: Ambulation/gait training;Balance/vestibular training;Community reintegration;Discharge planning;Disease management/prevention;DME/adaptive equipment instruction;Functional mobility training;Neuromuscular re-education;Pain  management;Patient/family education;Psychosocial support;Skin care/wound management;Splinting/orthotics;Stair training;Therapeutic Activities;Therapeutic Exercise;UE/LE Strength taining/ROM;UE/LE Coordination activities;Wheelchair propulsion/positioning PT Recommendation Follow Up Recommendations: Outpatient PT Equipment Recommended:  (AD TBD; may have SPC and RW at home (husband to bring))  PT Evaluation Precautions/Restrictions Precautions Precautions: Back;Fall Required Braces or Orthoses: Other Brace/Splint Other Brace/Splint: right knee brace  Restrictions Weight Bearing Restrictions: No  Pain C/o pain in back and R hip - RN notified. Reports migraine from earlier this AM is resolving.  Home Living/Prior Functioning Home Living Lives With: Spouse Available Help at Discharge: Family Type of Home: House Home Access: Stairs to enter [employer] of Steps: one threshold to enter Home Layout: One level Bathroom Shower/Tub: Forensic scientist: Standard Bathroom Accessibility: Yes How Accessible: Accessible via walker Home Adaptive Equipment: Bedside commode/3-in-1;Straight cane;Reacher;Walker - rolling Prior Function Level of Independence: Independent with basic ADLs;Independent with gait;Independent with transfers Able to Take Stairs?: Yes (down, not up unless she had too) Driving: Yes Vocation: Full time employment Vision/Perception  Vision - History Baseline Vision: Wears glasses only for reading Patient Visual Report: No change from baseline Perception Perception: Within Functional Limits Praxis Praxis: Intact  Cognition Overall Cognitive Status: Appears within functional limits for tasks assessed Arousal/Alertness: Awake/alert Orientation Level: Oriented X4 Memory: Appears intact Awareness: Appears intact Problem Solving: Appears intact Safety/Judgment: Appears intact Sensation Sensation Light Touch: Appears Intact (reports LT  was cold to RLE yest but feels normal today) Additional Comments: Bilateral UEs appear intact Coordination Gross Motor Movements are Fluid and Coordinated: Yes Fine Motor Movements are Fluid and Coordinated: Yes Motor  Motor Motor: Within Functional Limits      Trunk/Postural Assessment  Cervical Assessment Cervical Assessment: Within Functional Limits Thoracic Assessment Thoracic Assessment: Within Functional Limits Lumbar Assessment Lumbar Assessment: Within Functional Limits (within limits of back precautions from prior sx) Postural Control Postural Control: Within Functional Limits  Balance Balance Balance Assessed: Yes Static Sitting Balance Static Sitting - Level of Assistance: 6: Modified independent (Device/Increase time) Dynamic Sitting Balance Dynamic Sitting - Level of Assistance: 5: Stand by assistance Static Standing Balance Static Standing - Level of Assistance: 5: Stand by assistance Dynamic Standing Balance Dynamic Standing - Level of Assistance: 4: Min assist Extremity Assessment  RUE Assessment RUE Assessment: Within Functional Limits (can benefit from UE strengthening) LUE Assessment LUE Assessment: Within Functional Limits (Can benefit from UE strengthening) RLE Assessment RLE Assessment:  (3+/5 grossly, ROM WFL) LLE Assessment LLE Assessment: Within Functional Limits  H/o R hip replacement, needs bilateral knee replacements (wears a brace on R knee at all times due to instability longstanding), and also bilateral pronation at the feet. Pt reports she has tried orthotics in the past and was unsuccessful/unhappy with them.  See FIM for current functional status Refer to Care Plan for Long Term Goals  Recommendations for other services: None  Discharge Criteria: Patient will be discharged from PT if patient refuses treatment 3 consecutive times without medical reason, if treatment goals not met, if there is a change in medical status, if patient makes  no progress towards goals or if patient is discharged from hospital.  The above assessment, treatment plan,  treatment alternatives and goals were discussed and mutually agreed upon: by patient  1030-1125 (55 minutes)  Individual therapy treatment initiated with focus on gait and balance with trial of no AD, SPC, quad cane, and RW. Recommend at this time with nursing to use RW for increased stride, gait speed, and overall safety/balance but during therapy with challenge pt without AD to work on balance and will determine prior to d/c appropriate device. Pt in agreement. Discussed overall goals and LOS expectancy to which pt also in agreement and understanding.   Karolee Stamps [facility] 07/12/2012, 11:34 AM

## 2012-07-12 NOTE — Care Management Note (Signed)
    Page 1 of 1   07/12/2012     9:30:16 AM   CARE MANAGEMENT NOTE 07/12/2012  Patient:  Victoria Mosley, Victoria Mosley   Account Number:  [id number]  Date Initiated:  07/09/2012  Documentation initiated by:  [facility]  Subjective/Objective Assessment:   Admitted postop C7, T1, T2 laminectomy for evacuation of epidural hematoma.     Action/Plan:   PT/OT evals   Anticipated DC Date:  07/12/2012   Anticipated DC Plan:  HOME W HOME HEALTH SERVICES         Choice offered to / List presented to:             Status of service:  Completed, signed off Medicare Important Message given?   (If response is "NO", the following Medicare IM given date fields will be blank) Date Medicare IM given:   Date Additional Medicare IM given:    Discharge Disposition:  IP REHAB FACILITY  Per UR Regulation:  Reviewed for med. necessity/level of care/duration of stay  If discussed at Long Length of Stay Meetings, dates discussed:    Comments:

## 2012-07-12 NOTE — Progress Notes (Signed)
Patient information reviewed and entered into [employer] system by Dottie Vaquerano, RN, CRRN, PPS Coordinator.  Information including medical coding and functional independence measure will be reviewed and updated through discharge.    

## 2012-07-12 NOTE — Telephone Encounter (Signed)
I called to verify to dispense brand name only and I spoke to pt.

## 2012-07-12 NOTE — Progress Notes (Signed)
Occupational Therapy Note  Patient Details  Name: Victoria Mosley MRN: [id number] Date of Birth: [birth year] Today's Date: 07/12/2012  Time: 1330-1400 Pt c/o 4/10 pain in back of neck; RN aware and repositioned Individual Therapy Pt practiced amb with RW to ADL apartment from room with supervision.  Pt practiced bed transfers, tub bench transfers, toilet transfers X 2, and toileting.  Pt completed all tasks at supervision.  Focus on safety awareness, functional amb for home mgmt tasks, activity tolerance, and standing balance.   [name] [facility] 07/12/2012, 2:06 PM

## 2012-07-12 NOTE — Telephone Encounter (Signed)
I sent script e-scribe. 

## 2012-07-12 NOTE — H&P (Signed)
Physical Medicine and Rehabilitation Admission H&P    Chief Complaint  Patient presents with  .  RLE Weakness  : HPI: Victoria Mosley is a 61 y.o. right-handed female admitted 07/08/2012 after epidural cervical steroid injection 07/08/2012 per Dr. Alvester Morin. Patient noted approximately 1/2 hour to one hour after discharge she noted some weakness of her right leg and increasing neck pain. She returned to the emergency room And MRI of the cervical spine was performed that showed epidural hematoma at the level of C7-T1 compressing and effacing the right side of her spinal cord. She underwent C7, T1, T2 laminectomy evacuation of epidural hematoma per Dr. Danielle Dess 07/09/2012. Post op foley placed for bladder volume >300cc, decreased urine ouput  and reports of hesitancy. Therapies ongoing and CIR recommended for progression.   "I can move my leg better" I had bladder problems and tried every medication my urologist prescribed  Review of Systems  HENT: Negative for hearing loss.   Eyes: Negative for blurred vision and double vision.  Respiratory: Negative for cough and shortness of breath.   Cardiovascular: Negative for chest pain and palpitations.  Genitourinary: Positive for urgency and frequency.       Chronic bladder problems X years--no relief with multiple medications.   Musculoskeletal: Positive for back pain and joint pain (DJD B-knees).  Neurological: Positive for sensory change (cold sensation RLE laterally), focal weakness (able to SLR  on right this afternoon.) and headaches.   Past Medical History  Diagnosis Date  . Menopause     sees Dr. Audie Box  . 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 Migraines   . GERD (gastroesophageal reflux disease)   . Allergy   . IBS (irritable bowel syndrome)   . Arthritis   . Hyperlipidemia   . Diverticulitis   . Kidney stones   . Abnormal EKG 2012    stress test normal  . UTI (urinary tract infection)     started atb on 03-24-12    . Cancer   . Bilateral knee pain     sees Dr. Lequita Halt    Past Surgical History  Procedure Date  . Melanoma excision 2007    left upper arm per dr. Lonni Fix  . Foot surgery 1987    per Dr. Chaney Malling  . Nasal septum surgery 1992    per Dr. Berna Bue  . Lithotripsy 2007  . Lumbar laminectomy 1993    1993, at L5 per Dr. Roxan Hockey  . Colonoscopy 03-28-12    per Dr. Marina Goodell, polyp,  repeat in 5 yrs  . Cesarean section   . Tubal ligation   . Kidney stone surgery 2005    per Dr. Wanda Plump  . Total hip arthroplasty 2010    on right per Dr. Lequita Halt  . Posterior cervical laminectomy 07/08/2012    Procedure: POSTERIOR CERVICAL LAMINECTOMY;  Surgeon: Barnett Abu, MD;  Location: [facility];  Service: Neurosurgery;  Laterality: N/A;  Cervical Seven- thoracic One Laminectomy and Decompression of Epidural Hematoma  . Hematoma evacuation 07/08/2012    Procedure: EVACUATION HEMATOMA;  Surgeon: Barnett Abu, MD;  Location: [facility];  Service: Neurosurgery;  Laterality: N/A;  Cervical Seven-thoracic One Laminectomy and Decompression of Epidural Hematoma   Family History  Problem Relation Age of Onset  . Heart disease Mother   . Hypertension Mother   . Diabetes Mother   . Depression Father   . Hypertension Father   . Leukemia Father   . Hypertension Sister   .  Heart disease Sister   . Cancer Sister     bladder  . Stomach cancer Paternal Grandmother   . Colon cancer Neg Hx   . Esophageal cancer Neg Hx   . Rectal cancer Neg Hx    Social History:  Married. She reports that she has quit smoking. She has never used smokeless tobacco. She reports that she does not drink alcohol or use illicit drugs.   Allergies  Allergen Reactions  . Pravastatin Sodium (Pravachol)     Joint pain(severe)  . Biaxin (Clarithromycin) Nausea And Vomiting  . Ciprofloxacin Nausea Only  . Clarithromycin Nausea Only    Scheduled Meds:    . bisacodyl  10 mg Rectal Once  . dexamethasone  4 mg Oral Q6H  .  diphenhydrAMINE  25 mg Oral QHS  . estropipate  0.75 mg Oral Daily  . loratadine  10 mg Oral Daily  . medroxyPROGESTERone  2.5 mg Oral Daily  . mupirocin ointment  1 application Nasal BID  . pantoprazole  40 mg Oral Q1200  . propranolol  40 mg Oral QHS  . senna-docusate  2 tablet Oral QHS  . sorbitol  45 mL Oral Once  . Tamsulosin HCl  0.4 mg Oral QPC breakfast  . vitamin C  1,000 mg Oral Daily  . DISCONTD: propranolol  40 mg Oral Daily    Medications Prior to Admission  Medication Sig Dispense Refill  . aspirin 81 MG tablet Take 81 mg by mouth daily.       Marland Kitchen aspirin-acetaminophen-caffeine (EXCEDRIN MIGRAINE) 250-250-65 MG per tablet Take 1 tablet by mouth every 6 (six) hours as needed. For migraine      . B Complex Vitamins (VITAMIN B COMPLEX PO) Take 1 each by mouth daily.        . butalbital-acetaminophen-caffeine (FIORICET, ESGIC) 50-325-40 MG per tablet Take 1 tablet by mouth 2 (two) times daily as needed. For migraine      . Calcium-Vitamin D 600-200 MG-UNIT per tablet Take 1 tablet by mouth 2 (two) times daily.       Marland Kitchen dimenhyDRINATE (DRAMAMINE) 50 MG tablet Take 50 mg by mouth every 8 (eight) hours as needed. For nausea      . diphenhydrAMINE (BENADRYL) 25 mg capsule Take 25 mg by mouth at bedtime.        Marland Kitchen estropipate (OGEN) 0.75 MG tablet Take 1 tablet (0.75 mg total) by mouth daily.  90 tablet  4  . Flax OIL Take 1 each by mouth 2 (two) times daily.       Marland Kitchen ibuprofen (ADVIL,MOTRIN) 800 MG tablet Take 800 mg by mouth every 8 (eight) hours as needed. For pain      . loratadine (CLARITIN) 10 MG tablet Take 10 mg by mouth daily. For allergies       . medroxyPROGESTERone (PROVERA) 2.5 MG tablet Take 1 tablet (2.5 mg total) by mouth daily.  90 tablet  4  . Multiple Vitamins-Minerals (ONE-A-DAY WOMENS 50 PLUS PO) Take 1 each by mouth daily.        . naproxen sodium (ANAPROX) 220 MG tablet Take 220 mg by mouth every morning. Arthritis        . omeprazole (PRILOSEC) 20 MG capsule  Take 20 mg by mouth daily.        Marland Kitchen OVER THE COUNTER MEDICATION Take 1 capsule by mouth 2 (two) times daily. Turmeric      . Potassium Gluconate 595 MG CAPS Take 1 each by mouth daily.        Marland Kitchen  predniSONE (DELTASONE) 10 MG tablet Take 1 tablet (10 mg total) by mouth as needed. For mouth ulcers  60 tablet  5  . propranolol (INDERAL) 40 MG tablet Take 1 tablet (40 mg total) by mouth daily.  90 tablet  3  . vitamin C (ASCORBIC ACID) 500 MG tablet Take 1,000 mg by mouth daily.         Home:     Functional History:    Functional Status:  Mobility:          ADL:    Cognition:       Blood pressure 128/64, pulse 57, temperature 97.6 F (36.4 C), temperature source Oral, resp. rate 18, SpO2 98.00%. Physical Exam  Nursing note and vitals reviewed. Constitutional: She is oriented to person, place, and time. She appears well-developed and well-nourished.  HENT:  Head: Normocephalic and atraumatic.  Eyes: Pupils are equal, round, and reactive to light.  Neck: Normal range of motion.  Cardiovascular: Normal rate and regular rhythm.   Pulmonary/Chest: Effort normal and breath sounds normal.  Abdominal: Soft. Bowel sounds are normal. She exhibits no distension. There is no tenderness.  Musculoskeletal: She exhibits no edema and no tenderness.  Neurological: She is alert and oriented to person, place, and time.       Continues with weakness in right knee flexion and PF.  Skin: Skin is warm and dry.  Psychiatric: Her behavior is normal. Judgment and thought content normal.  5/5 in BUE 3/5 in R HF and R Quad, 4/5 in R ankle DF/PF,  Sensory hypersensitive to temp on L side Increased extensor tone RLE  Results for orders placed during the hospital encounter of 07/11/12 (from the past 48 hour(s))  URINALYSIS, ROUTINE W REFLEX MICROSCOPIC     Status: Abnormal   Collection Time   07/11/12 10:25 PM      Component Value Range Comment   Color, Urine YELLOW  YELLOW    APPearance CLEAR   CLEAR    Specific Gravity, Urine 1.009  1.005 - 1.030    pH 6.5  5.0 - 8.0    Glucose, UA NEGATIVE  NEGATIVE mg/dL    Hgb urine dipstick TRACE (*) NEGATIVE    Bilirubin Urine NEGATIVE  NEGATIVE    Ketones, ur NEGATIVE  NEGATIVE mg/dL    Protein, ur NEGATIVE  NEGATIVE mg/dL    Urobilinogen, UA 0.2  0.0 - 1.0 mg/dL    Nitrite NEGATIVE  NEGATIVE    Leukocytes, UA NEGATIVE  NEGATIVE   URINE MICROSCOPIC-ADD ON     Status: Normal   Collection Time   07/11/12 10:25 PM      Component Value Range Comment   Squamous Epithelial / LPF RARE  RARE    WBC, UA 0-2  <3 WBC/hpf    RBC / HPF 7-10  <3 RBC/hpf    Bacteria, UA RARE  RARE   CBC WITH DIFFERENTIAL     Status: Abnormal   Collection Time   07/12/12  6:00 AM      Component Value Range Comment   WBC 11.4 (*) 4.0 - 10.5 K/uL    RBC 4.23  3.87 - 5.11 MIL/uL    Hemoglobin 12.8  12.0 - 15.0 g/dL    HCT 45.4  09.8 - 11.9 %    MCV 89.8  78.0 - 100.0 fL    MCH 30.3  26.0 - 34.0 pg    MCHC 33.7  30.0 - 36.0 g/dL    RDW 14.7  82.9 -  15.5 %    Platelets 209  150 - 400 K/uL    Neutrophils Relative 73  43 - 77 %    Neutro Abs 8.3 (*) 1.7 - 7.7 K/uL    Lymphocytes Relative 15  12 - 46 %    Lymphs Abs 1.8  0.7 - 4.0 K/uL    Monocytes Relative 12  3 - 12 %    Monocytes Absolute 1.3 (*) 0.1 - 1.0 K/uL    Eosinophils Relative 0  0 - 5 %    Eosinophils Absolute 0.0  0.0 - 0.7 K/uL    Basophils Relative 0  0 - 1 %    Basophils Absolute 0.0  0.0 - 0.1 K/uL   COMPREHENSIVE METABOLIC PANEL     Status: Abnormal   Collection Time   07/12/12  6:00 AM      Component Value Range Comment   Sodium 139  135 - 145 mEq/L    Potassium 4.0  3.5 - 5.1 mEq/L    Chloride 104  96 - 112 mEq/L    CO2 26  19 - 32 mEq/L    Glucose, Bld 106 (*) 70 - 99 mg/dL    BUN 12  6 - 23 mg/dL    Creatinine, Ser 4.54  0.50 - 1.10 mg/dL    Calcium 9.0  8.4 - 09.8 mg/dL    Total Protein 6.2  6.0 - 8.3 g/dL    Albumin 3.3 (*) 3.5 - 5.2 g/dL    AST 18  0 - 37 U/L    ALT 16  0 - 35  U/L    Alkaline Phosphatase 63  39 - 117 U/L    Total Bilirubin 0.1 (*) 0.3 - 1.2 mg/dL    GFR calc non Af Amer >90  >90 mL/min    GFR calc Af Amer >90  >90 mL/min    No results found.  Post Admission Physician Evaluation: 1. 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 2. Patient is admitted to receive collaborative, interdisciplinary care between the physiatrist, rehab nursing staff, and therapy team. 3. Patient's level of medical complexity and substantial therapy needs in context of that medical necessity cannot be provided at a lesser intensity of care such as a SNF. 4. Patient has experienced substantial functional loss from his/her baseline which was documented above under the "Functional History" and "Functional Status" headings.  Judging by the patient's diagnosis, physical exam, and functional history, the patient has potential for functional progress which will result in measurable gains while on inpatient rehab.  These gains will be of substantial and practical use upon discharge  in facilitating mobility and self-care at the household level. 5. Physiatrist will provide 24 hour management of medical needs as well as oversight of the therapy plan/treatment and provide guidance as appropriate regarding the interaction of the two. 6. 24 hour rehab nursing will assist with bladder management, bowel management, safety, skin/wound care, disease management, medication administration, pain management and patient education  and help integrate therapy concepts, techniques,education, etc. 7. PT will assess and treat for:  Gait training, endurance , safety, equipment.  Goals are: Mod I mobility. 8. OT will assess and treat for: ADLs, equipment, safety, endurance.   Goals are: Mod I ADL. 9. SLP will assess and treat for: NA.  Goals are: NA. 10. Case Management and Social Worker will assess and treat for psychological issues and discharge  planning. 11. Team conference will be held weekly to assess  progress toward goals and to determine barriers to discharge. 12. Patient will receive at least 3 hours of therapy per day at least 5 days per week. 13. ELOS: 7 days      Prognosis:  good   Medical Problem List and Plan: 1. DVT Prophylaxis/Anticoagulation: Mechanical: Sequential compression devices, below knee Bilateral lower extremities 2. Pain Management: prn medications effective.  3. Mood: a little anxious but motivated. Will have  LCSW follow up for formal evaluation. 4. Neuropsych: This patient is capable of making decisions on his/her own behalf. 5. Hyperactive spastic bladder: continue flomax.  Bladder function will improve once constipation resolved. 6. Acute on chronic constipation:  Change senna to miralax. Suppository tonight with sorbitol if no results.  7. Migraines:  Continue propranolol with prn Fioricet.     07/12/2012, 7:19 AM

## 2012-07-13 ENCOUNTER — Inpatient Hospital Stay (HOSPITAL_COMMUNITY): Payer: 59 | Admitting: *Deleted

## 2012-07-13 LAB — URINE CULTURE
Colony Count: NO GROWTH
Culture: NO GROWTH

## 2012-07-13 NOTE — Progress Notes (Signed)
Patient ID: Victoria Mosley, female   DOB: [birth year], 61 y.o.   MRN: [id number] Subjective/Complaints:  9/14.  May complaint today is insomnia. Frequent urination contributing factor. Pleased with neuro progress otherwise. A 12 point review of systems has been performed and if not noted above is otherwise negative.  Chest clear to auscultation. Cardiovascular exam normal heart sounds no tachycardia abdomen benign. Extremities negative SCDs in place  BP Readings from Last 3 Encounters:  07/13/12 132/78  07/11/12 117/72  07/11/12 117/72   Objective: Vital Signs: Blood pressure 132/78, pulse 55, temperature 98.3 F (36.8 C), temperature source Oral, resp. rate 20, SpO2 96.00%. No results found.  Basename 07/12/12 0600  WBC 11.4*  HGB 12.8  HCT 38.0  PLT 209    Basename 07/12/12 0600  NA 139  K 4.0  CL 104  CO2 26  GLUCOSE 106*  BUN 12  CREATININE 0.62  CALCIUM 9.0   CBG (last 3)  No results found for this basename: GLUCAP:3 in the last 72 hours  Wt Readings from Last 3 Encounters:  07/10/12 83.6 kg (184 lb 4.9 oz)  07/08/12 77.111 kg (170 lb)  07/10/12 83.6 kg (184 lb 4.9 oz)    Physical Exam:  Nursing note and vitals reviewed.  Constitutional: She is oriented to person, place, and time.  Holding damp rag over eyes, forehead HENT:  Head: Normocephalic and atraumatic.  Eyes: Pupils are equal, round, and reactive to light.  Neck: Normal range of motion.  Cardiovascular: Normal rate and regular rhythm.  Pulmonary/Chest: Effort normal and breath sounds normal.  Abdominal: Soft. Bowel sounds are normal. She exhibits no distension. There is no tenderness.  Musculoskeletal: She exhibits no edema and no tenderness.  Neurological: She is alert and oriented to person, place, and time.  Continues with weakness in right knee flexion and PF. Able to lift right leg agst gravity. Left lower ext 3-4. ue's 4/5.  Skin: Skin is warm and dry.  Psychiatric: flat affect but  generally pleasant and cooperative.   Assessment/Plan: 1. Functional deficits secondary to C7-T1 EDH s/p lami, decompression which require 3+ hours per day of interdisciplinary therapy in a comprehensive inpatient rehab setting. Physiatrist is providing close team supervision and 24 hour management of active medical problems listed below. Physiatrist and rehab team continue to assess barriers to discharge/monitor patient progress toward functional and medical goals. FIM: FIM - Bathing Bathing: 0: Activity did not occur  FIM - Upper Body Dressing/Undressing Upper body dressing/undressing steps patient completed: Button/unbutton shirt Upper body dressing/undressing: 0: Activity did not occur FIM - Lower Body Dressing/Undressing Lower body dressing/undressing: 0: Activity did not occur  FIM - Toileting Toileting steps completed by patient: Adjust clothing prior to toileting;Performs perineal hygiene;Adjust clothing after toileting Toileting: 4: Steadying assist  FIM - Diplomatic Services operational officer Devices: Building control surveyor Transfers: 4-To toilet/BSC: Min A (steadying Pt. > 75%);4-From toilet/BSC: Min A (steadying Pt. > 75%)  FIM - Bed/Chair Transfer Bed/Chair Transfer: 5: Supine > Sit: Supervision (verbal cues/safety issues);5: Bed > Chair or W/C: Supervision (verbal cues/safety issues);5: Chair or W/C > Bed: Supervision (verbal cues/safety issues)  FIM - Locomotion: Wheelchair Locomotion: Wheelchair: 0: Activity did not occur (gait primary means of locomotion) FIM - Locomotion: Ambulation Locomotion: Ambulation Assistive Devices: Designer, industrial/product Ambulation/Gait Assistance: 4: Min guard Locomotion: Ambulation: 4: Travels 150 ft or more with minimal assistance (Pt.>75%)  Comprehension Comprehension Mode: Auditory Comprehension: 7-Follows complex conversation/direction: With no assist  Expression Expression Mode: Verbal Expression: 7-Expresses  complex  ideas: With no assist  Social Interaction Social Interaction: 7-Interacts appropriately with others - No medications needed.  Problem Solving Problem Solving: 7-Solves complex problems: Recognizes & self-corrects  Memory Memory: 7-Complete Independence: No helper  Medical Problem List and Plan:  1. DVT Prophylaxis/Anticoagulation: Mechanical: Sequential compression devices, below knee Bilateral lower extremities  2. Pain Management: prn medications effective.  3. Mood: a little anxious but motivated. Will have LCSW follow up for formal evaluation.  4. Neuropsych: This patient is capable of making decisions on his/her own behalf.  5. Hyperactive spastic bladder: continue flomax. Dc foley today once headache better. 6. Acute on chronic constipation: Change senna to miralax. Suppository with results. Needs further clean ot. 7. Migraines: Continue propranolol with prn Fioricet.   LOS (Days) 2 A FACE TO FACE EVALUATION WAS PERFORMED  Rogelia Boga 07/13/2012, 9:04 AM

## 2012-07-13 NOTE — Progress Notes (Signed)
Occupational Therapy Note  Patient Details  Name: Victoria Mosley MRN: [id number] Date of Birth: [birth year] Today's Date: 07/13/2012 Pain:  None noted.  See MAR Time:  1445-1530  (45 min) Individual therapy  Engaged in functional mobility to bathroom and to CVA gym.  Pt. Was minimal to supervision to bathroom with RW.  Use Biodex for balance activity.  Pt had most difficulty with posterior balance.  Instructed pt on abdominal muscle contractions and glutes.  She repeatedly stated, " I am going backwards."  Pt was minimal assist while doing balance activity "playing ball."   Humberto Seals 07/13/2012, 4:44 PM

## 2012-07-14 ENCOUNTER — Inpatient Hospital Stay (HOSPITAL_COMMUNITY): Payer: 59 | Admitting: *Deleted

## 2012-07-14 NOTE — Progress Notes (Signed)
Occupational Therapy Note  Patient Details  Name: Victoria Mosley MRN: [id number] Date of Birth: [birth year] Today's Date: 07/14/2012 Individual session Time:  0900-1000 (60 min) Pain: 5/10  1st session;      1st session"  Engaged in bathing and dressing at shower level.  Pt. Ambulated from toilet to shower with RW and supervision assist.  Pt. Sat on tub seat and stood for peri area with Supervision.  Pt. Returned to wc and put pants on with set up.  Needed assist with footies and washing feet; however she was able to put lotion on both feet.       Individual session Time:  1300-1345  ( ) Pain:  4-6 neck  2nd session;   2nd session  Engaged in myotherapy session for upper traps and rhomboids for decreased tension and pressure points.  Pt. Reported relief and wants to train her husband to do the technique.       Humberto Seals 07/14/2012, 10:01 AM

## 2012-07-14 NOTE — Progress Notes (Signed)
Physical Therapy Session Note  Patient Details  Name: Victoria Mosley MRN: [id number] Date of Birth: [birth year]  Today's Date: 07/14/2012 Time: 1120-1205 Time Calculation (min): 45 min  Short Term Goals: Week 1:  PT Short Term Goal 1 (Week 1): =LTGS  Skilled Therapeutic Interventions/Progress Updates:  Pt reporting feeling down, nauseated, and with fear of another migraine coming on, stating she would like to get some fresh air.  Tx focused on gait training in controlled and outdoor environments, WC propulsion for UE strengthening and activity tolerance, and LE strengthening.    Sit<>stand transfers x6 throughout tx with up to min guard/Min A needed, especially as pt fatigued.   Gait training in controlled environment 1x120', 1x140' and several short walking in/around room with safe, slow technique noted, but Min-guard provided due to pt not feeling well and guarded gait noted.  Gait speed in controlled environment = 1.57ft/sec  Gait in outdoor environment on concrete and brick 1x100', limited by fatigue. Pt propelled WC 1x50' with bil UE only, limited by fatigue, but no c/o pain.   Nu Step 1x25min with bil UE and LE only, level 2. Pt challenged to reach 40spm, but unable to exceed 23spm. Initially attempted LE only for maximal strengthening, but unable to generate enough power with RLE to maintain motion.       Therapy Documentation Precautions:  Precautions Precautions: Back;Fall Required Braces or Orthoses: Other Brace/Splint Other Brace/Splint: right knee brace  Restrictions Weight Bearing Restrictions: No    Pain: 6/10, nursing made aware, adjusted therapies/positioning prn   Locomotion : Ambulation Ambulation/Gait Assistance: 4: Min guard Wheelchair Mobility Distance: 50      See FIM for current functional status  Therapy/Group: Individual Therapy  Virl Cagey 07/14/2012, 12:23 PM

## 2012-07-14 NOTE — Progress Notes (Signed)
Patient ID: Victoria Mosley, female   DOB: 11/08/1950, 61 y.o.   MRN: [id number] Patient ID: Victoria Mosley, female   DOB: 03-18-1951, 61 y.o.   MRN: [id number] Subjective/Complaints:  9/15.  Insomnia improved as is the urinary frequency. Complaining of mild early-morning headaches. These improved throughout the day. Pleased with neuro progress otherwise. A 12 point review of systems has been performed and if not noted above is otherwise negative.  Chest clear to auscultation. Cardiovascular exam normal heart sounds no tachycardia abdomen benign. Extremities negative SCDs in place  BP Readings from Last 3 Encounters:  07/14/12 121/69  07/11/12 117/72  07/11/12 117/72   Objective: Vital Signs: Blood pressure 121/69, pulse 56, temperature 98.2 F (36.8 C), temperature source Oral, resp. rate 18, SpO2 95.00%. No results found.  Basename 07/12/12 0600  WBC 11.4*  HGB 12.8  HCT 38.0  PLT 209    Basename 07/12/12 0600  NA 139  K 4.0  CL 104  CO2 26  GLUCOSE 106*  BUN 12  CREATININE 0.62  CALCIUM 9.0   CBG (last 3)  No results found for this basename: GLUCAP:3 in the last 72 hours  Wt Readings from Last 3 Encounters:  07/10/12 83.6 kg (184 lb 4.9 oz)  07/08/12 77.111 kg (170 lb)  07/10/12 83.6 kg (184 lb 4.9 oz)    Physical Exam:  Nursing note and vitals reviewed.  Constitutional: She is oriented to person, place, and time.  Holding damp rag over eyes, forehead HENT:  Head: Normocephalic and atraumatic.  Eyes: Pupils are equal, round, and reactive to light.  Neck: Normal range of motion.  Cardiovascular: Normal rate and regular rhythm.  Pulmonary/Chest: Effort normal and breath sounds normal.  Abdominal: Soft. Bowel sounds are normal. She exhibits no distension. There is no tenderness.  Musculoskeletal: She exhibits no edema and no tenderness.  Neurological: She is alert and oriented to person, place, and time.  Continues with weakness in right knee flexion and PF.  Able to lift right leg agst gravity. Left lower ext 3-4. ue's 4/5.  Skin: Skin is warm and dry.  Psychiatric: flat affect but generally pleasant and cooperative.   Assessment/Plan: 1. Functional deficits secondary to C7-T1 EDH s/p lami, decompression which require 3+ hours per day of interdisciplinary therapy in a comprehensive inpatient rehab setting. Physiatrist is providing close team supervision and 24 hour management of active medical problems listed below. Physiatrist and rehab team continue to assess barriers to discharge/monitor patient progress toward functional and medical goals. FIM: FIM - Bathing Bathing: 0: Activity did not occur  FIM - Upper Body Dressing/Undressing Upper body dressing/undressing steps patient completed: Button/unbutton shirt Upper body dressing/undressing: 0: Activity did not occur FIM - Lower Body Dressing/Undressing Lower body dressing/undressing: 0: Activity did not occur  FIM - Toileting Toileting steps completed by patient: Adjust clothing prior to toileting;Performs perineal hygiene;Adjust clothing after toileting Toileting: 4: Steadying assist  FIM - Diplomatic Services operational officer Devices: Building control surveyor Transfers: 4-To toilet/BSC: Min A (steadying Pt. > 75%);4-From toilet/BSC: Min A (steadying Pt. > 75%)  FIM - Bed/Chair Transfer Bed/Chair Transfer: 5: Supine > Sit: Supervision (verbal cues/safety issues);5: Bed > Chair or W/C: Supervision (verbal cues/safety issues);5: Chair or W/C > Bed: Supervision (verbal cues/safety issues)  FIM - Locomotion: Wheelchair Locomotion: Wheelchair: 0: Activity did not occur (gait primary means of locomotion) FIM - Locomotion: Ambulation Locomotion: Ambulation Assistive Devices: Designer, industrial/product Ambulation/Gait Assistance: 4: Min guard Locomotion: Ambulation: 4: Travels 150 ft or  more with minimal assistance (Pt.>75%)  Comprehension Comprehension Mode: Auditory Comprehension:  7-Follows complex conversation/direction: With no assist  Expression Expression Mode: Verbal Expression: 7-Expresses complex ideas: With no assist  Social Interaction Social Interaction: 7-Interacts appropriately with others - No medications needed.  Problem Solving Problem Solving: 7-Solves complex problems: Recognizes & self-corrects  Memory Memory: 7-Complete Independence: No helper  Medical Problem List and Plan:  1. DVT Prophylaxis/Anticoagulation: Mechanical: Sequential compression devices, below knee Bilateral lower extremities  2. Pain Management: prn medications effective.  3. Mood: a little anxious but motivated. Will have LCSW follow up for formal evaluation.  4. Neuropsych: This patient is capable of making decisions on his/her own behalf.  5. Hyperactive spastic bladder: continue flomax. Dc foley today once headache better. 6. Acute on chronic constipation: Change senna to miralax. Suppository with results. Needs further clean ot. 7. Migraines: Continue propranolol with prn Fioricet.   LOS (Days) 3 A FACE TO FACE EVALUATION WAS PERFORMED  Rogelia Boga 07/14/2012, 8:42 AM

## 2012-07-15 ENCOUNTER — Inpatient Hospital Stay (HOSPITAL_COMMUNITY): Payer: 59

## 2012-07-15 ENCOUNTER — Inpatient Hospital Stay (HOSPITAL_COMMUNITY): Payer: 59 | Admitting: Physical Therapy

## 2012-07-15 MED ORDER — OXYCODONE HCL 5 MG PO TABS
5.0000 mg | ORAL_TABLET | ORAL | Status: DC | PRN
  Administered 2012-07-15 – 2012-07-18 (×10): 5 mg via ORAL
  Filled 2012-07-15 (×13): qty 1

## 2012-07-15 NOTE — Progress Notes (Signed)
Occupational Therapy Note  Patient Details  Name: Victoria Mosley MRN: [id number] Date of Birth: [birth year] Today's Date: 07/15/2012  Time: 1400-1430 Pt c/o 7/10 pain in back of neck and bilateral shoulders; meds admin prior to therapy Individual Therapy Pt practiced toilet transfers, toileting, tub bench transfers, and bed transfers.  Pt amb with RW in ADL apartment for home mgmt tasks.  Walker Systems developer.  Recommended BSC and tub transfer bench for use at home.  Pt completed all tasks at supervision level.   [name] [facility] 07/15/2012, 3:01 PM

## 2012-07-15 NOTE — Progress Notes (Signed)
Physical Therapy Session Note  Patient Details  Name: Victoria Mosley MRN: [id number] Date of Birth: [birth year]  Today's Date: 07/15/2012 Time: 1430-1500 Time Calculation (min): 30 min  Short Term Goals: Week 1:  PT Short Term Goal 1 (Week 1): =LTGS  Skilled Therapeutic Interventions/Progress Updates:   Ambulation with then without RW x 250' over controlled and carpeted surfaces, supervision for safety. Pt more nervous without device and continues to prefer RW. Dynamic standing balance activities working single limb without upper extremity support. Ambulation and bed negotiation in room performed with supervision, nearing modified independence.  Therapy Documentation Precautions:  Precautions Precautions: Back;Fall Required Braces or Orthoses: Other Brace/Splint Other Brace/Splint: right knee brace  Restrictions Weight Bearing Restrictions: No Pain: Pain Assessment Pain Assessment: 0-10 Pain Score:   5 Pain Type: Acute pain Pain Location: Neck Pain Orientation: Right;Left (upper traps) Pain Descriptors: Aching Pain Onset: On-going Pain Intervention(s): Repositioned;Other (Comment) (Pt called for medication at end of session.) Locomotion : Ambulation Ambulation/Gait Assistance: 5: Supervision   See FIM for current functional status  Therapy/Group: Individual Therapy  Wilhemina Bonito 07/15/2012, 5:24 PM

## 2012-07-15 NOTE — Progress Notes (Signed)
Subjective/Complaints: Headaches wax and wane. fioricet tends to help.  -sleep is important  Pleased with neuro progress otherwise. A 12 point review of systems has been performed and if not noted above is otherwise negative.   Objective: Vital Signs: Blood pressure 126/73, pulse 58, temperature 98.2 F (36.8 C), temperature source Oral, resp. rate 20, SpO2 98.00%. No results found. No results found for this basename: WBC:2,HGB:2,HCT:2,PLT:2 in the last 72 hours No results found for this basename: NA:2,K:2,CL:2,CO2:2,GLUCOSE:2,BUN:2,CREATININE:2,CALCIUM:2 in the last 72 hours CBG (last 3)  No results found for this basename: GLUCAP:3 in the last 72 hours  Wt Readings from Last 3 Encounters:  07/10/12 83.6 kg (184 lb 4.9 oz)  07/08/12 77.111 kg (170 lb)  07/10/12 83.6 kg (184 lb 4.9 oz)    Physical Exam:  Nursing note and vitals reviewed.  Constitutional: She is oriented to person, place, and time.  Holding damp rag over eyes, forehead HENT:  Head: Normocephalic and atraumatic.  Eyes: Pupils are equal, round, and reactive to light.  Neck: Normal range of motion.  Cardiovascular: Normal rate and regular rhythm.  Pulmonary/Chest: Effort normal and breath sounds normal.  Abdominal: Soft. Bowel sounds are normal. She exhibits no distension. There is no tenderness.  Musculoskeletal: She exhibits no edema and no tenderness.  Neurological: She is alert and oriented to person, place, and time.  Continues with weakness in right knee flexion and PF. Able to lift right leg agst gravity. Left lower ext 3-4. ue's 4/5.  Skin: Skin is warm and dry.  Psychiatric: flat affect but generally pleasant and cooperative.   Assessment/Plan: 1. Functional deficits secondary to C7-T1 EDH s/p lami, decompression which require 3+ hours per day of interdisciplinary therapy in a comprehensive inpatient rehab setting. Physiatrist is providing close team supervision and 24 hour management of active medical  problems listed below. Physiatrist and rehab team continue to assess barriers to discharge/monitor patient progress toward functional and medical goals. FIM: FIM - Bathing Bathing: 0: Activity did not occur  FIM - Upper Body Dressing/Undressing Upper body dressing/undressing steps patient completed: Button/unbutton shirt Upper body dressing/undressing: 0: Activity did not occur FIM - Lower Body Dressing/Undressing Lower body dressing/undressing: 0: Activity did not occur  FIM - Toileting Toileting steps completed by patient: Adjust clothing prior to toileting;Performs perineal hygiene;Adjust clothing after toileting Toileting: 5: Supervision: Safety issues/verbal cues  FIM - Diplomatic Services operational officer Devices: Environmental consultant;Bedside commode Toilet Transfers: 5-To toilet/BSC: Supervision (verbal cues/safety issues);5-From toilet/BSC: Supervision (verbal cues/safety issues)  FIM - Banker Devices: Therapist, occupational: 5: Bed > Chair or W/C: Supervision (verbal cues/safety issues);5: Chair or W/C > Bed: Supervision (verbal cues/safety issues)  FIM - Locomotion: Wheelchair Distance: 50 Locomotion: Wheelchair: 2: Travels 50 - 149 ft with supervision, cueing or coaxing FIM - Locomotion: Ambulation Locomotion: Ambulation Assistive Devices: Designer, industrial/product Ambulation/Gait Assistance: 4: Min guard Locomotion: Ambulation: 2: Travels 50 - 149 ft with minimal assistance (Pt.>75%)  Comprehension Comprehension Mode: Auditory Comprehension: 7-Follows complex conversation/direction: With no assist  Expression Expression Mode: Verbal Expression: 7-Expresses complex ideas: With no assist  Social Interaction Social Interaction: 6-Interacts appropriately with others with medication or extra time (anti-anxiety, antidepressant).  Problem Solving Problem Solving: 5-Solves complex 90% of the time/cues < 10% of the time  Memory Memory:  7-Complete Independence: No helper  Medical Problem List and Plan:  1. DVT Prophylaxis/Anticoagulation: Mechanical: Sequential compression devices, below knee Bilateral lower extremities  2. Pain Management: prn medications effective.  3. Mood: a  little anxious but motivated. Will have LCSW follow up for formal evaluation.  4. Neuropsych: This patient is capable of making decisions on his/her own behalf.  5. Hyperactive spastic bladder: continue flomax-change to am schedule- i think she has a retaining bladder honestly  -recheck urine culture 6. Acute on chronic constipation: Change senna to miralax. Suppository with results. Needs further clean ot. 7. Migraines: Continue propranolol with prn Fioricet.   LOS (Days) 4 A FACE TO FACE EVALUATION WAS PERFORMED  Eion Timbrook T 07/15/2012, 8:43 AM

## 2012-07-15 NOTE — Progress Notes (Signed)
Physical Therapy Session Note  Patient Details  Name: Victoria Mosley MRN: [id number] Date of Birth: 1951-01-21  Today's Date: 07/15/2012 Time: 1100-1200 Time Calculation (min): 60 min  Short Term Goals: Week 1:  PT Short Term Goal 1 (Week 1): =LTGS  Skilled Therapeutic Interventions/Progress Updates:    Pt talkative but pleasant, pain focused at times. Gait x 150', 150' in controlled environment with RW and supervision. No losses of balance, pt likely ready for ambulation without device however pt reports she is more comfortable with RW. Community gait x >250' over uneven terrain (brick, unlevel sidewalk) ascending and descending inclines performed with RW and supervision - cues for safety, identifying trip hazards, and energy conservation. Gait x 60' in home environment with RW walking forwards, backwards, side stepping with min-guard assist. Cues for negotiation of RW. Car transfer to/from low car and high SUV height performed with supervision. Sit <> stands from varied surfaces including low chair without armrests, supervision. Stair training x 4 performed sideways with min assist.   Therapy Documentation Precautions:  Precautions Precautions: Back;Fall Required Braces or Orthoses: Other Brace/Splint Other Brace/Splint: right knee brace  Restrictions Weight Bearing Restrictions: No Pain: Pain Assessment Pain Assessment: 0-10 Pain Score:   6 Pain Type: Acute pain Pain Location: Neck Pain Orientation: Right;Left Pain Descriptors: Aching Pain Onset: On-going Pain Intervention(s): RN made aware;Repositioned Locomotion : Ambulation Ambulation/Gait Assistance: 5: Supervision   See FIM for current functional status  Therapy/Group: Individual Therapy  Wilhemina Bonito 07/15/2012, 12:08 PM

## 2012-07-15 NOTE — Progress Notes (Signed)
Occupational Therapy Session Note  Patient Details  Name: Victoria Mosley MRN: [id number] Date of Birth: 1950/11/14  Today's Date: 07/15/2012 Time: 0800-0900 Time Calculation (min): 60 min  Short Term Goals: Week 1:  OT Short Term Goal 1 (Week 1): Short Term Goals = Long Term Goals  Skilled Therapeutic Interventions/Progress Updates:    Pt in bed talking on phone but immediately terminated call and attended to therapist.  Pt stated she didn't need to bathe this morning because she had a "great" shower yesterday.  Pt stated she did not want to don shirt because it had a tendency to pull on her neck but did want to don pants..  Pt requested to use BSC and amb with RW to bathroom with supervision.  Pt completed toileting with supervision.  Pt amb to gym with RW.  Pt noted that head ROM decreased secondary to soreness and "tight" muscles.  Therapist performed myotherapy on upper traps, rhomboids, and middle traps.  Several trigger points noted and patient verbalized relief afterwards.  Pt exhibited increased ROM after myotherapy.  Pt amb with RW back to room and positioned self in recliner.  Therapy Documentation Precautions:  Precautions Precautions: Back;Fall Required Braces or Orthoses: Other Brace/Splint Other Brace/Splint: right knee brace  Restrictions Weight Bearing Restrictions: No General:   Pain: Pt c/o 7/10 pain neck and shoulders; RN aware; myotherapy; repositioning    See FIM for current functional status  Therapy/Group: Individual Therapy  Rich Brave 07/15/2012, 9:03 AM

## 2012-07-16 ENCOUNTER — Inpatient Hospital Stay (HOSPITAL_COMMUNITY): Payer: 59

## 2012-07-16 ENCOUNTER — Inpatient Hospital Stay (HOSPITAL_COMMUNITY): Payer: 59 | Attending: Physical Medicine & Rehabilitation

## 2012-07-16 ENCOUNTER — Inpatient Hospital Stay (HOSPITAL_COMMUNITY): Payer: 59 | Admitting: Physical Therapy

## 2012-07-16 LAB — URINE CULTURE
Colony Count: NO GROWTH
Culture: NO GROWTH

## 2012-07-16 NOTE — Progress Notes (Signed)
Social Work Patient ID: Victoria Mosley, female   DOB: 1951-08-29, 61 y.o.   MRN: [id number] Met with pt to inform team conference goals-mod/i level and discharge 9/20.  Pt is pleased bladder has begun to work since this was one of her Concerns.  Agreeable to team's recommendations and pleased with her progress, now needs to build her confidence.

## 2012-07-16 NOTE — Progress Notes (Signed)
Physical Therapy Session Note  Patient Details  Name: Victoria Mosley MRN: [id number] Date of Birth: 11-22-1950  Today's Date: 07/16/2012 Time: 1115-1200 Time Calculation (min): 45 min  Short Term Goals: Week 1:  PT Short Term Goal 1 (Week 1): =LTGS  Skilled Therapeutic Interventions/Progress Updates:    Gait with RW x 200' with supervision/modified independent in controlled environment. Household ambulation walking in varied directions, backwards, and sidestepping over carpeted and tile surfaces with supervision nearing modified independence. Kitchen negotiation opening doors at varied heights as tolerated, supervision. Dynamic balance with out RW support walking while alternating LE kicks of bean bags, min-guard assist. Ambulation x 140' without RW, supervision slight loss of balance with turns however pt able to self correct without assistance.   Therapy Documentation Precautions:  Precautions Precautions: Back;Fall Required Braces or Orthoses: Other Brace/Splint Other Brace/Splint: right knee brace  Restrictions Weight Bearing Restrictions: No Pain: Pain Assessment Pain Assessment: 0-10 Pain Score:   5 Pain Type: Acute pain Pain Location: Head Pain Descriptors: Headache Pain Onset: On-going Pain Intervention(s): Repositioned;Other (Comment) (Premedicated) Locomotion : Ambulation Ambulation/Gait Assistance: 5: Supervision   See FIM for current functional status  Therapy/Group: Individual Therapy  Wilhemina Bonito 07/16/2012, 12:18 PM

## 2012-07-16 NOTE — Progress Notes (Signed)
Occupational Therapy Session Note  Patient Details  Name: Victoria Mosley MRN: [id number] Date of Birth: 04/05/1951  Today's Date: 07/16/2012  Session 1 Time: 5621-3086 Time Calculation (min): 45 min  Short Term Goals: Week 1:  OT Short Term Goal 1 (Week 1): Short Term Goals = Long Term Goals  Skilled Therapeutic Interventions/Progress Updates:    Pt elected to complete bathing tasks at sink with sit<>stand from w/c and LB dressing with sit<>stand from EOB.  Pt completed all tasks with supervision.  Pt elected to not wear shirts at this time secondary to surgical site on posterior neck.  Focus on safety awareness, activity tolerance, standing balance, and functional amb with RW to complete home mgmt tasks in room.  Pt exhibits limited head ROM secondary to surgery.  Therapy Documentation Precautions:  Precautions Precautions: Back;Fall Required Braces or Orthoses: Other Brace/Splint Other Brace/Splint: right knee brace  Restrictions Weight Bearing Restrictions: No Pain: Pain Assessment Pain Assessment: 0-10 Pain Score:   6 Pain Type: Surgical pain Pain Location: Neck Pain Orientation: Posterior Pain Descriptors: Aching Pain Onset: On-going Patients Stated Pain Goal: 2 Pain Intervention(s): RN made aware  See FIM for current functional status  Therapy/Group: Individual Therapy  Session 2 Time: 1400-1430 Pt c/o 6/10 pain in posterior neck; RN aware and meds admin at end of session Individual therapy Pg practiced tub bench transfers and stepping over into tub using wall to steady balance.  Pt completed task with supervision.  Pt amb with RW for home mgmt tasks including retrieving items from drawers and transporting to closet.  Pt does not exhibit any unsafe behavior.  [name] [facility] 07/16/2012, 8:36 AM

## 2012-07-16 NOTE — Patient Care Conference (Signed)
Inpatient RehabilitationTeam Conference Note Date: 07/16/2012   Time: 2:22 PM    Patient Name: Victoria Mosley      Medical Record Number: [id number]  Date of Birth: [birth year] Sex: Female         Room/Bed: 4007/4007-01 Payor Info: Payor: Advertising copywriter  Plan: Advertising copywriter  Product Type: *No Product type*     Admitting Diagnosis: C7 T2 LAMI WITH EVAC OF HEMATOMA  Admit Date/Time:  07/11/2012  8:20 PM Admission Comments: No comment available   Primary Diagnosis:  Traumatic epidural hematoma Principal Problem: Traumatic epidural hematoma  Patient Active Problem List   Diagnosis Date Noted  . Traumatic epidural hematoma 07/12/2012  . IBS (irritable bowel syndrome) 07/26/2011  . OSTEOARTHRITIS 01/26/2010  . NEPHROLITHIASIS, HX OF 01/26/2010  . DIVERTICULITIS, COLON 11/02/2008  . HYPERLIPIDEMIA 02/25/2008  . COMMON MIGRAINE 10/17/2007  . ALLERGIC RHINITIS 06/19/2007  . GERD 06/19/2007    Expected Discharge Date: Expected Discharge Date: 07/19/12  Team Members Present: Physician: Dr. Faith Rogue Social Worker Present: Dossie Der, LCSW Nurse Present: Daryll Brod, RN PT Present: Karolee Stamps, PT OT Present: Mackie Pai, OT;Patricia Mat Carne, OT     Current Status/Progress Goal Weekly Team Focus  Medical   edh after cervical epidural  pain control, bladder emptying and control  see chart   Bowel/Bladder   Continent of bowel and bladder. LBM 07/14/12. c/o urinary urgency  Pt to remain continent of bowel and bladder  Offer toilet assit q 1hr   Swallow/Nutrition/ Hydration     na   na     ADL's   supervision overall  mod I overall  activity tolerance, safety awareness   Mobility   Supervision overall, pt not confident in self.  Modified independent  Improved balance, endurance, independence with gait and stairs.    Communication     wfl   wfl     Safety/Cognition/ Behavioral Observations    na   na     Pain   Oxy IR 5mg  q 4hrs prn, Tylenol 325-65mg  q 4hrs   <3  Offer Oxy IR 5mg  along with Tylenol 325mg  1hr prior to surgery   Skin   I incision healing/monitoring   incision healing        *See Interdisciplinary Assessment and Plan and progress notes for long and short-term goals  Barriers to Discharge: pain,    Possible Resolutions to Barriers:  analgesia, coping skills    Discharge Planning/Teaching Needs:  Home with husband who can assist.  Pt very motivated to be independent.  Short length of stay doing well.      Team Discussion:  Pt progressing well.  Bladder emptying now , ques over active-will follow up with urol as OP. Ready soon for discharge  Revisions to Treatment Plan:  None   Continued Need for Acute Rehabilitation Level of Care: The patient requires daily medical management by a physician with specialized training in physical medicine and rehabilitation for the following conditions: Daily direction of a multidisciplinary physical rehabilitation program to ensure safe treatment while eliciting the highest outcome that is of practical value to the patient.: Yes Daily medical management of patient stability for increased activity during participation in an intensive rehabilitation regime.: Yes Daily analysis of laboratory values and/or radiology reports with any subsequent need for medication adjustment of medical intervention for : Post surgical problems;Neurological problems  Lucy Chris 07/16/2012, 2:22 PM

## 2012-07-16 NOTE — Progress Notes (Signed)
Subjective/Complaints:  -sleeping better. Changing flomax helped  -pain improving  Pleased with neuro progress otherwise. A 12 point review of systems has been performed and if not noted above is otherwise negative.   Objective: Vital Signs: Blood pressure 111/66, pulse 54, temperature 98.5 F (36.9 C), temperature source Oral, resp. rate 19, SpO2 98.00%. No results found. No results found for this basename: WBC:2,HGB:2,HCT:2,PLT:2 in the last 72 hours No results found for this basename: NA:2,K:2,CL:2,CO2:2,GLUCOSE:2,BUN:2,CREATININE:2,CALCIUM:2 in the last 72 hours CBG (last 3)  No results found for this basename: GLUCAP:3 in the last 72 hours  Wt Readings from Last 3 Encounters:  07/10/12 83.6 kg (184 lb 4.9 oz)  07/08/12 77.111 kg (170 lb)  07/10/12 83.6 kg (184 lb 4.9 oz)    Physical Exam:  Nursing note and vitals reviewed.  Constitutional: She is oriented to person, place, and time.  Holding damp rag over eyes, forehead HENT:  Head: Normocephalic and atraumatic.  Eyes: Pupils are equal, round, and reactive to light.  Neck: Normal range of motion.  Cardiovascular: Normal rate and regular rhythm.  Pulmonary/Chest: Effort normal and breath sounds normal.  Abdominal: Soft. Bowel sounds are normal. She exhibits no distension. There is no tenderness.  Musculoskeletal: She exhibits no edema and no tenderness.  Neurological: She is alert and oriented to person, place, and time.  Continues with weakness in right knee flexion and PF but its better. Able to lift right leg agst gravity. Left lower ext 4. ue's 4/5.  Skin: Skin is warm and dry. Staples in place Psychiatric: flat affect but generally pleasant and cooperative.   Assessment/Plan: 1. Functional deficits secondary to C7-T1 EDH s/p lami, decompression which require 3+ hours per day of interdisciplinary therapy in a comprehensive inpatient rehab setting. Physiatrist is providing close team supervision and 24 hour management  of active medical problems listed below. Physiatrist and rehab team continue to assess barriers to discharge/monitor patient progress toward functional and medical goals. FIM: FIM - Bathing Bathing: 0: Activity did not occur  FIM - Upper Body Dressing/Undressing Upper body dressing/undressing steps patient completed: Button/unbutton shirt Upper body dressing/undressing: 0: Activity did not occur FIM - Lower Body Dressing/Undressing Lower body dressing/undressing: 0: Activity did not occur  FIM - Toileting Toileting steps completed by patient: Adjust clothing prior to toileting;Performs perineal hygiene;Adjust clothing after toileting Toileting: 5: Supervision: Safety issues/verbal cues  FIM - Archivist Transfers Assistive Devices: Bedside commode;Walker Toilet Transfers: 5-To toilet/BSC: Supervision (verbal cues/safety issues);5-From toilet/BSC: Supervision (verbal cues/safety issues)  FIM - Banker Devices: Therapist, occupational: 5: Sit > Supine: Supervision (verbal cues/safety issues);5: Bed > Chair or W/C: Supervision (verbal cues/safety issues);5: Chair or W/C > Bed: Supervision (verbal cues/safety issues)  FIM - Locomotion: Wheelchair Distance: 50 Locomotion: Wheelchair: 0: Activity did not occur (Gait primary means of locomotion) FIM - Locomotion: Ambulation Locomotion: Ambulation Assistive Devices: Designer, industrial/product Ambulation/Gait Assistance: 5: Supervision Locomotion: Ambulation: 5: Travels 150 ft or more with supervision/safety issues  Comprehension Comprehension Mode: Auditory Comprehension: 7-Follows complex conversation/direction: With no assist  Expression Expression Mode: Verbal Expression: 7-Expresses complex ideas: With no assist  Social Interaction Social Interaction: 6-Interacts appropriately with others with medication or extra time (anti-anxiety, antidepressant).  Problem Solving Problem Solving:  5-Solves complex 90% of the time/cues < 10% of the time  Memory Memory: 7-Complete Independence: No helper  Medical Problem List and Plan:  1. DVT Prophylaxis/Anticoagulation: Mechanical: Sequential compression devices, below knee Bilateral lower extremities  2. Pain Management: prn  medications effective.  3. Mood: a little anxious but motivated. Will have LCSW follow up for formal evaluation.  4. Neuropsych: This patient is capable of making decisions on his/her own behalf.  5. Hyperactive spastic bladder: continue flomax-changed to am schedule- i think she has a retaining bladder honestly  -recheck urine culture  -check pvr's 6. Acute on chronic constipation: Change senna to miralax. Suppository with results. Needs further clean ot. 7. Migraines: Continue propranolol with prn Fioricet.   LOS (Days) 5 A FACE TO FACE EVALUATION WAS PERFORMED  Omya Winfield T 07/16/2012, 7:07 AM

## 2012-07-16 NOTE — Progress Notes (Signed)
Staples removed from cervical and thoracic area per md's order. Visible edema noted along L mid incisional line. Steri-strips placed per pt's request. Pt tolerated procedure well.

## 2012-07-16 NOTE — Progress Notes (Signed)
Physical Therapy Session Note  Patient Details  Name: Victoria Mosley MRN: [id number] Date of Birth: 1951/10/03  Today's Date: 07/16/2012 Time: 1500-1600 Time Calculation (min): 60 min  Short Term Goals: Week 1:  PT Short Term Goal 1 (Week 1): =LTGS  Skilled Therapeutic Interventions/Progress Updates:  Patient participated in upper extremity strengthening group which consisted WU:[id number] shoulders; punches forward with bilateral UE x 3 minutes; bicep curls with 1.5 lb weights x 3 minutes; shoulder flexion with 1.5  lb weights x 3 minutes; rowing with 1 lb weighted bar x 3 minutes forward and backward; shoulder abduction/adduction and rotation using 1 lb weighted bar.   Therapy Documentation Precautions:  Precautions Precautions: Back;Fall Required Braces or Orthoses: Other Brace/Splint Other Brace/Splint: right knee brace  Restrictions Weight Bearing Restrictions: No General:   Pain: Pain Assessment Pain Assessment: 0-10 Pain Score:   4 Patient reports that she just received pain medication from RN prior to therapy.  See FIM for current functional status  Therapy/Group: Group Therapy  Alma Friendly 07/16/2012, 5:06 PM

## 2012-07-17 ENCOUNTER — Inpatient Hospital Stay (HOSPITAL_COMMUNITY): Payer: 59

## 2012-07-17 ENCOUNTER — Inpatient Hospital Stay (HOSPITAL_COMMUNITY): Payer: 59 | Admitting: Physical Therapy

## 2012-07-17 DIAGNOSIS — Z5189 Encounter for other specified aftercare: Secondary | ICD-10-CM

## 2012-07-17 DIAGNOSIS — S24101A Unspecified injury at T1 level of thoracic spinal cord, initial encounter: Secondary | ICD-10-CM

## 2012-07-17 MED ORDER — DIAZEPAM 2 MG PO TABS
2.0000 mg | ORAL_TABLET | Freq: Every day | ORAL | Status: DC
  Administered 2012-07-17 – 2012-07-18 (×2): 2 mg via ORAL
  Filled 2012-07-17 (×2): qty 1

## 2012-07-17 MED ORDER — SACCHAROMYCES BOULARDII 250 MG PO CAPS
250.0000 mg | ORAL_CAPSULE | Freq: Two times a day (BID) | ORAL | Status: DC
  Administered 2012-07-17 – 2012-07-19 (×5): 250 mg via ORAL
  Filled 2012-07-17 (×8): qty 1

## 2012-07-17 MED ORDER — METHOCARBAMOL 500 MG PO TABS
750.0000 mg | ORAL_TABLET | Freq: Four times a day (QID) | ORAL | Status: DC | PRN
  Administered 2012-07-17 – 2012-07-19 (×4): 750 mg via ORAL
  Filled 2012-07-17 (×4): qty 2

## 2012-07-17 NOTE — Progress Notes (Signed)
Subjective/Complaints:  -poor sleep. Headache. Oxy and fioricet hype me up!  -frequent stool with urination yesterday  -pain improving  Pleased with neuro progress otherwise. A 12 point review of systems has been performed and if not noted above is otherwise negative.   Objective: Vital Signs: Blood pressure 102/71, pulse 64, temperature 98.7 F (37.1 C), temperature source Oral, resp. rate 19, SpO2 100.00%. No results found. No results found for this basename: WBC:2,HGB:2,HCT:2,PLT:2 in the last 72 hours No results found for this basename: NA:2,K:2,CL:2,CO2:2,GLUCOSE:2,BUN:2,CREATININE:2,CALCIUM:2 in the last 72 hours CBG (last 3)  No results found for this basename: GLUCAP:3 in the last 72 hours  Wt Readings from Last 3 Encounters:  07/10/12 83.6 kg (184 lb 4.9 oz)  07/08/12 77.111 kg (170 lb)  07/10/12 83.6 kg (184 lb 4.9 oz)    Physical Exam:  Nursing note and vitals reviewed.  Constitutional: She is oriented to person, place, and time.  Holding damp rag over eyes, forehead HENT:  Head: Normocephalic and atraumatic.  Eyes: Pupils are equal, round, and reactive to light.  Neck: Normal range of motion.  Cardiovascular: Normal rate and regular rhythm.  Pulmonary/Chest: Effort normal and breath sounds normal.  Abdominal: Soft. Bowel sounds are normal. She exhibits no distension. There is no tenderness.  Musculoskeletal: She exhibits no edema and no tenderness.  Neurological: She is alert and oriented to person, place, and time.  Continues with weakness in right knee flexion and PF but its better. Able to lift right leg agst gravity. Left lower ext 4. ue's 4/5.  Skin: Skin is warm and dry. Staples out Psychiatric: flat affect but generally pleasant and cooperative.   Assessment/Plan: 1. Functional deficits secondary to C7-T1 EDH s/p lami, decompression which require 3+ hours per day of interdisciplinary therapy in a comprehensive inpatient rehab setting. Physiatrist is  providing close team supervision and 24 hour management of active medical problems listed below. Physiatrist and rehab team continue to assess barriers to discharge/monitor patient progress toward functional and medical goals. FIM: FIM - Bathing Bathing: 0: Activity did not occur  FIM - Upper Body Dressing/Undressing Upper body dressing/undressing steps patient completed: Button/unbutton shirt Upper body dressing/undressing: 0: Activity did not occur FIM - Lower Body Dressing/Undressing Lower body dressing/undressing: 0: Activity did not occur  FIM - Toileting Toileting steps completed by patient: Adjust clothing prior to toileting;Performs perineal hygiene;Adjust clothing after toileting Toileting: 5: Supervision: Safety issues/verbal cues  FIM - Archivist Transfers Assistive Devices: Bedside commode;Walker Toilet Transfers: 5-To toilet/BSC: Supervision (verbal cues/safety issues);5-From toilet/BSC: Supervision (verbal cues/safety issues)  FIM - Banker Devices: Therapist, occupational: 6: Supine > Sit: No assist;5: Bed > Chair or W/C: Supervision (verbal cues/safety issues);5: Chair or W/C > Bed: Supervision (verbal cues/safety issues)  FIM - Locomotion: Wheelchair Distance: 50 Locomotion: Wheelchair: 1: Total Assistance/staff pushes wheelchair (Pt<25%) FIM - Locomotion: Ambulation Locomotion: Ambulation Assistive Devices: Designer, industrial/product Ambulation/Gait Assistance: 5: Supervision Locomotion: Ambulation: 5: Travels 150 ft or more with supervision/safety issues  Comprehension Comprehension Mode: Auditory Comprehension: 7-Follows complex conversation/direction: With no assist  Expression Expression Mode: Verbal Expression: 7-Expresses complex ideas: With no assist  Social Interaction Social Interaction: 6-Interacts appropriately with others with medication or extra time (anti-anxiety, antidepressant).  Problem  Solving Problem Solving: 7-Solves complex problems: Recognizes & self-corrects  Memory Memory: 7-Complete Independence: No helper  Medical Problem List and Plan:  1. DVT Prophylaxis/Anticoagulation: Mechanical: Sequential compression devices, below knee Bilateral lower extremities  2. Pain Management: prn medications effective.  3. Mood: a little anxious but motivated. Will have LCSW follow up for formal evaluation.  4. Neuropsych: This patient is capable of making decisions on his/her own behalf.  5. Hyperactive spastic bladder: continue flomax-changed to am schedule- i think she has a retaining bladder honestly  -recheck urine culture was negative  -check pvr's--low volume--probably would benefit from anticholinergic med 6. Acute on chronic constipation. Hold miralax due to stool frequency. Add probiotic. 7. Migraines: Continue propranolol with prn Fioricet.   LOS (Days) 6 A FACE TO FACE EVALUATION WAS PERFORMED  Imagine Nest T 07/17/2012, 7:50 AM

## 2012-07-17 NOTE — Progress Notes (Signed)
Physical Therapy Note  Patient Details  Name: Makaiyah Schweiger Montanari MRN: [id number] Date of Birth: September 13, 1951 Today's Date: 07/17/2012  1515-1615 (60 minutes) group Pain: 4/10 cervical/premedicated Pt participated in PT group session focused on UE strengthening/endurance including Nustep (ergonometer) 2 X 5 minutes LEVEL 3 using UEs/LEs; standing without UE support performing bilateral UE exercises - scapular adduction, elbow extension, bicep curls X 20 using green theraband.; gait to/from therapy 120 feet SBA RW.  Sparsh Callens,JIM 07/17/2012, 8:26 AM

## 2012-07-17 NOTE — Progress Notes (Signed)
Occupational Therapy Session Note  Patient Details  Name: Victoria Mosley MRN: [id number] Date of Birth: [birth year]  Today's Date: 07/17/2012  Session 1 Time: 4696-2952 Time Calculation (min): 43 min  Short Term Goals: Week 1:  OT Short Term Goal 1 (Week 1): Short Term Goals = Long Term Goals  Skilled Therapeutic Interventions/Progress Updates:    Pt declined shower this morning and completed bathing and dressing seated EOB.  Pt stated she did not sleep well the previous night.  Pt did agree to donning shirt this morning.  Pt completed all tasks at supervision level.  Pt engaged in functional amb with RW for home mgmt tasks in room.  Focus on standing balance, activity tolerance, and safety awareness. Therapy Documentation Precautions:  Precautions Precautions: Back;Fall Required Braces or Orthoses: Other Brace/Splint Other Brace/Splint: right knee brace  Restrictions Weight Bearing Restrictions: No  Pain: Pain Assessment Pain Assessment: 0-10 Pain Score:   5 Pain Type: Acute pain Pain Location: Head Pain Orientation: Posterior Pain Descriptors: Aching Pain Frequency: Occasional Pain Onset: On-going Patients Stated Pain Goal: 2 Pain Intervention(s): Repositioned  See FIM for current functional status  Therapy/Group: Individual Therapy  Session 2 Time: 1330-1400 Pt c/o 5/10 pain in bilateral shoulders; myotherapy Individual Therapy Pt engaged in functional amb with and without RW to perform home mgmt tasks in room and ADL apartment.  Pt required steady assist when ambulating without RW and supervision with RW.  Practiced tub bench transfers and stepping over into tub with supervision.  [name] [facility] 07/17/2012, 8:29 AM

## 2012-07-17 NOTE — Progress Notes (Signed)
Physical Therapy Session Note  Patient Details  Name: Victoria Mosley MRN: [id number] Date of Birth: Jan 13, 1951  Today's Date: 07/17/2012 Time: 6644-0347 Time Calculation (min): 45 min  Short Term Goals: Week 1:  PT Short Term Goal 1 (Week 1): =LTGS  Skilled Therapeutic Interventions/Progress Updates:    Focused on gait with RW for endurance and strengthening overall in distracting environment, no LOB. Dynamic standing balance activity on compliant surface with no UE support to reach across midline and place horseshoes on basketball hoop to R and L with overall close S. [place] for LE strengthening x 10 min total with rest breaks as needed in seated position.   Therapy Documentation Precautions:  Precautions Precautions: Back;Fall Required Braces or Orthoses: Other Brace/Splint Other Brace/Splint: right knee brace  Restrictions Weight Bearing Restrictions: No    Pain: Premedicated for pain. Rest breaks as needed.   Locomotion : Ambulation Ambulation/Gait Assistance: 5: Supervision   See FIM for current functional status  Therapy/Group: Individual Therapy  Karolee Stamps [facility] 07/17/2012, 9:57 AM

## 2012-07-18 ENCOUNTER — Inpatient Hospital Stay (HOSPITAL_COMMUNITY): Payer: 59 | Admitting: Physical Therapy

## 2012-07-18 ENCOUNTER — Inpatient Hospital Stay (HOSPITAL_COMMUNITY): Payer: 59

## 2012-07-18 LAB — BASIC METABOLIC PANEL
BUN: 21 mg/dL (ref 6–23)
CO2: 30 mEq/L (ref 19–32)
Calcium: 9.2 mg/dL (ref 8.4–10.5)
Chloride: 99 mEq/L (ref 96–112)
Creatinine, Ser: 0.77 mg/dL (ref 0.50–1.10)
GFR calc Af Amer: >90 mL/min (ref >90)
GFR calc non Af Amer: 89 mL/min — ABNORMAL LOW (ref >90)
Glucose, Bld: 90 mg/dL (ref 70–99)
Potassium: 4.2 mEq/L (ref 3.5–5.1)
Sodium: 134 mEq/L — ABNORMAL LOW (ref 135–145)

## 2012-07-18 MED ORDER — SACCHAROMYCES BOULARDII 250 MG PO CAPS: 250.0000 mg | ORAL_CAPSULE | Freq: Two times a day (BID) | ORAL | Status: DC

## 2012-07-18 MED ORDER — OXYCODONE HCL 5 MG PO TABS: 5.0000 mg | ORAL_TABLET | Freq: Four times a day (QID) | ORAL | Status: DC | PRN

## 2012-07-18 MED ORDER — ACETAMINOPHEN 325 MG PO TABS
325.0000 mg | ORAL_TABLET | ORAL | Status: DC | PRN
Start: 2012-07-18 — End: 2012-12-25

## 2012-07-18 MED ORDER — DIAZEPAM 2 MG PO TABS: 2.0000 mg | ORAL_TABLET | Freq: Every day | ORAL | Status: DC

## 2012-07-18 MED ORDER — TAMSULOSIN HCL 0.4 MG PO CAPS: 0.4000 mg | ORAL_CAPSULE | Freq: Every day | ORAL | Status: DC

## 2012-07-18 MED ORDER — METHOCARBAMOL 750 MG PO TABS: 750.0000 mg | ORAL_TABLET | Freq: Four times a day (QID) | ORAL | Status: DC | PRN

## 2012-07-18 NOTE — Progress Notes (Signed)
Subjective/Complaints:  Slept well. Valium was quite helpful with muscle relaxation. Pleased with neuro progress otherwise. A 12 point review of systems has been performed and if not noted above is otherwise negative.   Objective: Vital Signs: Blood pressure 128/69, pulse 67, temperature 98 F (36.7 C), temperature source Oral, resp. rate 19, weight 76.023 kg (167 lb 9.6 oz), SpO2 99.00%. No results found. No results found for this basename: WBC:2,HGB:2,HCT:2,PLT:2 in the last 72 hours  Basename 07/18/12 0600  NA 134*  K 4.2  CL 99  CO2 30  GLUCOSE 90  BUN 21  CREATININE 0.77  CALCIUM 9.2   CBG (last 3)  No results found for this basename: GLUCAP:3 in the last 72 hours  Wt Readings from Last 3 Encounters:  07/17/12 76.023 kg (167 lb 9.6 oz)  07/10/12 83.6 kg (184 lb 4.9 oz)  07/08/12 77.111 kg (170 lb)    Physical Exam:  Nursing note and vitals reviewed.  Constitutional: She is oriented to person, place, and time.  Holding damp rag over eyes, forehead HENT:  Head: Normocephalic and atraumatic.  Eyes: Pupils are equal, round, and reactive to light.  Neck: Normal range of motion.  Cardiovascular: Normal rate and regular rhythm.  Pulmonary/Chest: Effort normal and breath sounds normal.  Abdominal: Soft. Bowel sounds are normal. She exhibits no distension. There is no tenderness.  Musculoskeletal: She exhibits no edema and no tenderness.  Neurological: She is alert and oriented to person, place, and time.  Continues with weakness in right knee flexion and PF but its better. Able to lift right leg agst gravity. Left lower ext 4. ue's 4 to 4+/5.  Skin: Skin is warm and dry. Staples out Psychiatric: flat affect but generally pleasant and cooperative.   Assessment/Plan: 1. Functional deficits secondary to C7-T1 EDH s/p lami, decompression which require 3+ hours per day of interdisciplinary therapy in a comprehensive inpatient rehab setting. Physiatrist is providing close  team supervision and 24 hour management of active medical problems listed below. Physiatrist and rehab team continue to assess barriers to discharge/monitor patient progress toward functional and medical goals. FIM: FIM - Bathing Bathing Steps Patient Completed: Chest;Right Arm;Left Arm;Abdomen;Front perineal area;Buttocks;Right upper leg;Left upper leg;Right lower leg (including foot);Left lower leg (including foot) Bathing: 5: Supervision: Safety issues/verbal cues  FIM - Upper Body Dressing/Undressing Upper body dressing/undressing steps patient completed: Thread/unthread right sleeve of pullover shirt/dresss;Thread/unthread left sleeve of pullover shirt/dress;Put head through opening of pull over shirt/dress;Pull shirt over trunk Upper body dressing/undressing: 5: Supervision: Safety issues/verbal cues FIM - Lower Body Dressing/Undressing Lower body dressing/undressing steps patient completed: Thread/unthread right pants leg;Thread/unthread left pants leg;Pull pants up/down;Don/Doff right sock;Don/Doff left sock;Don/Doff right shoe;Don/Doff left shoe Lower body dressing/undressing: 0: Activity did not occur  FIM - Toileting Toileting steps completed by patient: Adjust clothing prior to toileting;Performs perineal hygiene;Adjust clothing after toileting Toileting: 5: Supervision: Safety issues/verbal cues  FIM - Archivist Transfers Assistive Devices: Bedside commode;Walker Toilet Transfers: 5-To toilet/BSC: Supervision (verbal cues/safety issues);5-From toilet/BSC: Supervision (verbal cues/safety issues)  FIM - Banker Devices: Therapist, occupational: 6: Chair or W/C > Bed: No assist;6: Bed > Chair or W/C: No assist  FIM - Locomotion: Wheelchair Distance: 50 Locomotion: Wheelchair: 0: Activity did not occur (gait to/from therapy) FIM - Locomotion: Ambulation Locomotion: Ambulation Assistive Devices: Dealer Ambulation/Gait Assistance: 5: Supervision Locomotion: Ambulation: 5: Travels 150 ft or more with supervision/safety issues  Comprehension Comprehension Mode: Auditory Comprehension: 7-Follows complex conversation/direction: With no  assist  Expression Expression Mode: Verbal Expression: 7-Expresses complex ideas: With no assist  Social Interaction Social Interaction: 6-Interacts appropriately with others with medication or extra time (anti-anxiety, antidepressant).  Problem Solving Problem Solving: 7-Solves complex problems: Recognizes & self-corrects  Memory Memory: 7-Complete Independence: No helper  Medical Problem List and Plan:  1. DVT Prophylaxis/Anticoagulation: Mechanical: Sequential compression devices, below knee Bilateral lower extremities  2. Pain Management: prn medications effective.  3. Mood: a little anxious but motivated. Will have LCSW follow up for formal evaluation.  4. Neuropsych: This patient is capable of making decisions on his/her own behalf.  5. Hyperactive spastic bladder: continue flomax-changed to am schedule-   -check pvr's--low volume--probably would benefit from anticholinergic med  -outpt uro consult 6. Acute on chronic constipation. Held miralax due to stool frequency. Add probiotic. 7. Migraines: Continue propranolol with prn Fioricet.   -valium qhs   LOS (Days) 7 A FACE TO FACE EVALUATION WAS PERFORMED  SWARTZ,ZACHARY T 07/18/2012, 8:07 AM

## 2012-07-18 NOTE — Progress Notes (Signed)
Occupational Therapy Session Note  Patient Details  Name: Victoria Mosley MRN: [id number] Date of Birth: [birth year]  Today's Date: 07/18/2012  Session 1 Time: 4098-1191 Time Calculation (min): 45 min  Short Term Goals: Week 1:  OT Short Term Goal 1 (Week 1): Short Term Goals = Long Term Goals  Skilled Therapeutic Interventions/Progress Updates:    Pt engaged in bathing and dressing tasks at mod I level.  Focus on safety awareness and safety recommendations at home.  Pt amb with RW in room to place dirty linens in dirty linen bag.  Pt amb with RW into hall to locate RN and request medications.  Practiced tub bench transfers.  Therapy Documentation Precautions:  Precautions Precautions: Back;Fall Required Braces or Orthoses: Other Brace/Splint Other Brace/Splint: right knee brace  Restrictions Weight Bearing Restrictions: No Pain: Pain Assessment Pain Assessment: 0-10 Pain Score:   7 Pain Type: Acute pain Pain Location: Neck Pain Orientation: Posterior Pain Descriptors: Aching;Sore Pain Frequency: Intermittent Pain Onset: On-going Patients Stated Pain Goal: 2 Pain Intervention(s): Repositioned;Distraction  See FIM for current functional status  Therapy/Group: Individual Therapy  Session 2 Time: 1300-1330 Pt c/o cough; pt received meds prior to therapy Individual Therapy  Pt amb with RW in room to gather clothing and personal items in preparation for discharge tomorrow.  Discussed safety at home and recommendation for schedule after discharge.  Pt now mod I in room with RW.  Practiced toilet transfers and navigating in room with various pieces of furniture scattered around.  Victoria Mosley [facility] 07/18/2012, 8:34 AM

## 2012-07-18 NOTE — Progress Notes (Signed)
Physical Therapy Discharge Summary  Patient Details  Name: Victoria Mosley MRN: [id number] Date of Birth: 11/07/1950  Today's Date: 07/18/2012 Time: 1330-1410 Time Calculation (min): 40 min  Skilled Therapeutic Interventions/Progress Updates:  Session focused on community ambulation over uneven paved and brick surfaces with RW. Practiced inclines/ declines, curb, and outdoor steps. Pt ambulated x >400' with RW, supervision. Discussed safety in community and energy conservation techniques. Gait over controlled environment x 400' without RW, supervision. Pt continues to be anxious with mobility, fearful of falling. Reports she is excited to go to outpatient therapies to continue to challenge her balance and decrease need for RW. Pt has not had any issues in room with modified independent status while utilizing RW.    Patient has met 8 of 8 long term goals due to improved activity tolerance, improved balance, increased strength, decreased pain and ability to compensate for deficits.  Patient to discharge at an ambulatory level Modified Independent.   Patient's care partner is independent to provide the necessary physical assistance at discharge. Family did not come for education, pt leaving at modified independent level, able to verbalize any assistance needed and reports husband able to care for her if needed  Reasons goals not met: NA  Recommendation:  Patient will benefit from ongoing skilled PT services in outpatient setting to continue to advance safe functional mobility, address ongoing impairments in decreased gait speed and endurance as would be expected for age, impaired balance without device, minimize fall risk, and return to prior level of function (promote return to work).  Equipment: RW  Reasons for discharge: treatment goals met and discharge from hospital  Patient/family agrees with progress made and goals achieved: Yes  PT Discharge Precautions/Restrictions Fall Vital  Signs Therapy Vitals Temp: 98.8 F (37.1 C) Temp src: Oral Pulse Rate: 78  Resp: 18  BP: 115/76 mmHg Patient Position, if appropriate: Lying Oxygen Therapy SpO2: 98 % Pain Pain Assessment Pain Assessment: 0-10 Pain Score:   4 Pain Type: Acute pain Pain Location: Neck Pain Orientation: Posterior Pain Descriptors: Aching (tight) Pain Onset: On-going Pain Intervention(s): Repositioned;Other (Comment) (encouraged relaxation techniques) Vision/Perception     Cognition Orientation Level: Oriented X4 Sensation Sensation Light Touch: Appears Intact (bil. LEs) Proprioception: Appears Intact Coordination Gross Motor Movements are Fluid and Coordinated: Yes Fine Motor Movements are Fluid and Coordinated: Yes Motor  Motor Motor: Within Functional Limits  Mobility Bed Mobility Bed Mobility: Sit to Supine;Supine to Sit Supine to Sit: 6: Modified independent (Device/Increase time) Sitting - Scoot to Edge of Bed: 6: Modified independent (Device/Increase time) Sit to Supine: 6: Modified independent (Device/Increase time) Transfers Sit to Stand: 6: Modified independent (Device/Increase time) Stand to Sit: 6: Modified independent (Device/Increase time) Locomotion  Ambulation Ambulation: Yes Ambulation/Gait Assistance: 6: Modified independent (Device/Increase time) Ambulation Distance (Feet): 400 Feet (greater than) Assistive device: Rolling walker Gait Gait: Yes Gait Pattern: Impaired Gait Pattern: Step-through pattern (very guarded, blocked trunk with gait. Decreased arm swing) High Level Ambulation High Level Ambulation: Side stepping;Backwards walking;Direction changes;Sudden stops Side Stepping: modified independent Backwards Walking: modified independent Direction Changes: modified independent Sudden Stops: modified independent  Trunk/Postural Assessment     Balance Balance Balance Assessed: Yes Static Sitting Balance Static Sitting - Balance Support: Feet  supported Static Sitting - Level of Assistance: 6: Modified independent (Device/Increase time) Dynamic Sitting Balance Dynamic Sitting - Level of Assistance: 6: Modified independent (Device/Increase time) Static Standing Balance Static Standing - Balance Support: No upper extremity supported Static Standing - Level of Assistance: 6:  Modified independent (Device/Increase time) Dynamic Standing Balance Dynamic Standing - Level of Assistance: 6: Modified independent (Device/Increase time) Extremity Assessment      RLE Assessment RLE Assessment:  (3+/5 grossly, ROM WFL) LLE Assessment LLE Assessment: Within Functional Limits  See FIM for current functional status  Wilhemina Bonito 07/18/2012, 5:19 PM

## 2012-07-18 NOTE — Progress Notes (Signed)
Physical Therapy Session Note  Patient Details  Name: Victoria Mosley MRN: [id number] Date of Birth: Aug 15, 1951  Today's Date: 07/18/2012 Time: 0930-1029 Time Calculation (min): 59 min  Short Term Goals: Week 1:  PT Short Term Goal 1 (Week 1): =LTGS  Skilled Therapeutic Interventions/Progress Updates:    Pt modified independent with >600' ambulation in controlled environment with RW. Bed mobility, household ambulation x 18' with RW, car transfer (at SUV and low car height), 15 steps all performed with modified independence. Pt made modified independent in room, explained conditions and continued need for safety. Nursing made aware  6 min walk test = 404' = 123.139 meters with RW which is well below normal distance for community dwelling individual. Pt will benefit from continued outpatient therapy to progress to prior level of function.   Therapy Documentation Precautions:  Precautions Precautions: Back;Fall Required Braces or Orthoses: Other Brace/Splint Other Brace/Splint: right knee brace  Restrictions Weight Bearing Restrictions: No Pain: Pain Assessment Pain Assessment: 0-10 Pain Score:   5 Pain Type: Acute pain Pain Location: Neck Pain Orientation: Posterior Pain Descriptors: Aching Pain Onset: On-going Pain Intervention(s): Repositioned  See FIM for current functional status  Therapy/Group: Individual Therapy  Wilhemina Bonito 07/18/2012, 12:30 PM

## 2012-07-18 NOTE — Progress Notes (Signed)
Social Work Discharge Note Discharge Note  The overall goal for the admission was met for:   Discharge location: Yes-HOME WITH HUSBAND  Length of Stay: Yes-8 DAYS  Discharge activity level: Yes-MOD/I LEVEL  Home/community participation: Yes  Services provided included: MD, RD, PT, OT, RN, TR, Pharmacy and SW  Financial Services: Private Insurance: [facility]  Follow-up services arranged: Outpatient: [facility]-PT,OT  9/25 10:00-12:00 and DME: [facility]-BSC, TUB BENCH  Comments (or additional information):PT DID VERY WELL HERE READY TO GO HOME  Patient/Family verbalized understanding of follow-up arrangements: Yes  Individual responsible for coordination of the follow-up plan: SELF & STEVEN-HUSBAND  Confirmed correct DME delivered: Lucy Chris 07/18/2012    Lucy Chris

## 2012-07-19 NOTE — Progress Notes (Signed)
Subjective/Complaints:  Slept well. Valium was quite helpful with muscle relaxation. Pleased with neuro progress otherwise. A 12 point review of systems has been performed and if not noted above is otherwise negative.   Objective: Vital Signs: Blood pressure 109/69, pulse 61, temperature 98.7 F (37.1 C), temperature source Oral, resp. rate 20, weight 76.023 kg (167 lb 9.6 oz), SpO2 97.00%. No results found. No results found for this basename: WBC:2,HGB:2,HCT:2,PLT:2 in the last 72 hours  Basename 07/18/12 0600  NA 134*  K 4.2  CL 99  CO2 30  GLUCOSE 90  BUN 21  CREATININE 0.77  CALCIUM 9.2   CBG (last 3)  No results found for this basename: GLUCAP:3 in the last 72 hours  Wt Readings from Last 3 Encounters:  07/17/12 76.023 kg (167 lb 9.6 oz)  07/10/12 83.6 kg (184 lb 4.9 oz)  07/08/12 77.111 kg (170 lb)    Physical Exam:  Nursing note and vitals reviewed.  Constitutional: She is oriented to person, place, and time.  Holding damp rag over eyes, forehead HENT:  Head: Normocephalic and atraumatic.  Eyes: Pupils are equal, round, and reactive to light.  Neck: Normal range of motion.  Cardiovascular: Normal rate and regular rhythm.  Pulmonary/Chest: Effort normal and breath sounds normal.  Abdominal: Soft. Bowel sounds are normal. She exhibits no distension. There is no tenderness.  Musculoskeletal: She exhibits no edema and no tenderness.  Neurological: She is alert and oriented to person, place, and time.    Left lower ext 4. ue's 4 to 4+/5. 4/5 on RLE  Skin: Skin is warm and dry. Staples out Psychiatric: flat affect but generally pleasant and cooperative.   Assessment/Plan: 1. Functional deficits secondary to C7-T1 EDH s/p lami, decompression which require 3+ hours per day of interdisciplinary therapy in a comprehensive inpatient rehab setting. Physiatrist is providing close team supervision and 24 hour management of active medical problems listed below. Physiatrist  and rehab team continue to assess barriers to discharge/monitor patient progress toward functional and medical goals. FIM: FIM - Bathing Bathing Steps Patient Completed: Chest;Right Arm;Left Arm;Abdomen;Front perineal area;Buttocks;Right upper leg;Left upper leg;Right lower leg (including foot);Left lower leg (including foot) Bathing: 6: More than reasonable amount of time  FIM - Upper Body Dressing/Undressing Upper body dressing/undressing steps patient completed: Thread/unthread right sleeve of pullover shirt/dresss;Thread/unthread left sleeve of pullover shirt/dress;Put head through opening of pull over shirt/dress;Pull shirt over trunk Upper body dressing/undressing: 6: More than reasonable amount of time FIM - Lower Body Dressing/Undressing Lower body dressing/undressing steps patient completed: Thread/unthread right pants leg;Thread/unthread left pants leg;Pull pants up/down;Don/Doff right sock;Don/Doff left sock;Don/Doff right shoe;Don/Doff left shoe Lower body dressing/undressing: 6: More than reasonable amount of time  FIM - Toileting Toileting steps completed by patient: Adjust clothing prior to toileting;Performs perineal hygiene;Adjust clothing after toileting Toileting: 6: More than reasonable amount of time  FIM - Diplomatic Services operational officer Devices: Elevated toilet seat Toilet Transfers: 6-Assistive device: No helper;6-From toilet/BSC  FIM - Banker Devices: Therapist, occupational: 6: Supine > Sit: No assist;6: Sit > Supine: No assist;6: Bed > Chair or W/C: No assist;6: Chair or W/C > Bed: No assist  FIM - Locomotion: Wheelchair Distance: 50 Locomotion: Wheelchair: 0: Activity did not occur (gait primary means of locomotion) FIM - Locomotion: Ambulation Locomotion: Ambulation Assistive Devices: Designer, industrial/product Ambulation/Gait Assistance: 6: Modified independent (Device/Increase time) Locomotion: Ambulation: 6:  Travels 150 ft or more with assistive device/no helper  Comprehension Comprehension Mode: Auditory Comprehension:  7-Follows complex conversation/direction: With no assist  Expression Expression Mode: Verbal Expression: 7-Expresses complex ideas: With no assist  Social Interaction Social Interaction: 7-Interacts appropriately with others - No medications needed.  Problem Solving Problem Solving: 6-Solves complex problems: With extra time  Memory Memory: 7-Complete Independence: No helper  Medical Problem List and Plan:  1. DVT Prophylaxis/Anticoagulation: Mechanical: Sequential compression devices, below knee Bilateral lower extremities  2. Pain Management: prn medications effective.  3. Mood: a little anxious but motivated. Will have LCSW follow up for formal evaluation.  4. Neuropsych: This patient is capable of making decisions on his/her own behalf.  5. Hyperactive spastic bladder: continue flomax-changed to am schedule-   -check pvr's--low volume--  -outpt uro consult 6. Acute on chronic constipation. Held miralax due to stool frequency. Add probiotic. 7. Migraines: Continue propranolol with prn Fioricet.   -valium qhs very helpful   LOS (Days) 8 A FACE TO FACE EVALUATION WAS PERFORMED  Victoria Mosley T 07/19/2012, 8:20 AM

## 2012-07-19 NOTE — Progress Notes (Signed)
Patient D/Ced home with self care.  Written and verbal Discharge instructions given via PA Delle Reining.  Patient refused additional pain medication for Migraine Headache.  Patient Alert and Oriented and accompanied by husband.

## 2012-07-19 NOTE — Progress Notes (Signed)
Occupational Therapy Discharge Summary  Patient Details  Name: Victoria Mosley MRN: [id number] Date of Birth: 02-15-1951  Today's Date: 07/19/2012  Patient has met 10 of 10 long term goals due to improved activity tolerance and improved balance.  Pt made excellent progress with bathing, dressing, and functional mobility and transfers since admission.  Pt is mod I/I for bathing, dressing, toilet transfers and toileting; supervision for tub transfers. Patient to discharge at overall Modified Independent level.  Patient's care partner is independent to provide the necessary physical assistance at discharge.    Reasons goals not met: all goals met Recommendation:  Patient will benefit from ongoing skilled OT services in outpatient setting to continue to advance functional skills in the area of iADL and Reduce care partner burden.  Equipment: tub transfer bench; BSC  Reasons for discharge: treatment goals met and discharge from hospital  Patient/family agrees with progress made and goals achieved: Yes  OT Discharge Precautions/Restrictions  Restrictions Weight Bearing Restrictions: No   Pain Pain Assessment Pain Assessment: 0-10 Pain Score:   3 Pain Intervention(s): MD notified (Comment) ADL ADL Equipment Provided: Reacher Eating: Independent Grooming: Independent Upper Body Bathing: Modified independent Lower Body Bathing: Modified independent Upper Body Dressing: Modified independent (Device) Lower Body Dressing: Modified independent Toileting: Modified independent Toilet Transfer: Modified independent Tub/Shower Transfer: Distant supervision   Extremity/Trunk Assessment RUE Assessment RUE Assessment: Within Functional Limits LUE Assessment LUE Assessment: Within Functional Limits  See FIM for current functional status  Rich Brave 07/19/2012, 11:28 AM

## 2012-07-23 NOTE — Discharge Summary (Signed)
[name], [name]             ACCOUNT NO.:  [id number]  MEDICAL RECORD NO.:  [id number]  LOCATION:  4007                         FACILITY:  [facility]  PHYSICIAN:  Ranelle Oyster, M.D.DATE OF BIRTH:  Feb 26, 1951  DATE OF ADMISSION:  07/11/2012 DATE OF DISCHARGE:  07/19/2012                              DISCHARGE SUMMARY   DISCHARGE DIAGNOSES: 1. Epidural cervical hematoma causing Brown-Sequard syndrome. 2. Hyperactive spastic bladder. 3. History of migraines. 4. Insomnia.  HISTORY OF PRESENT ILLNESS:  [name] is a 61 year old female, admitted on July 08, 2012, after epidural cervical steroid injection causing increase in neck pain as well as weakness in her right lower extremity.  MRI of cervical spine showed epidural hematoma at level of C7-T1 compressing and effacing the right side of her spinal cord.  She underwent C7, T1, T2 laminectomy with evacuation of epidural hematoma by Dr. Danielle Dess on July 09, 2012.  Postop, required Foley placement for bladder volume greater than 300 mL as well as reports of hesitancy and decrease in urine output.  The patient was evaluated by Therapy Team and CIR was recommended for progression.  PAST MEDICAL HISTORY: 1. Acne. 2. Granuloma annulare. 3. GERD. 4. Allergies. 5. IBS. 6. Hyperlipidemia. 7. Diverticulitis. 8. Renal calculi. 9. Frequent UTI. 10.Hyperactive bladder. 11.Bilateral end-stage knee DJD.  FUNCTIONAL HISTORY:  The patient was independent prior to admission.  FUNCTIONAL STATUS:  The patient was min-assist for transfers, min-assist ambulating 70 feet with rolling walker.  RECENT LABS:  Check of lytes from July 18, 2012, revealed sodium 134, potassium 4.2, chloride 99, CO2 of 30, BUN 21, creatinine 0.77, glucose 89.  CBC at admission on July 12, 2012, revealed hemoglobin 12.8, hematocrit 38.0, white count 11.4, platelets 209.  Urine culture was done on July 15, 2012, showing no  growth.  HOSPITAL COURSE:  [name] was admitted to Rehab on July 11, 2012, for inpatient therapies to consist of PT, OT at least 3 hours 5 days a week.  Past-admission, physiatrist, rehab RN, and therapy team have worked together to provide customized collaborative interdisciplinary care.  Rehab RN has worked with the patient on bowel and bladder program.  She was started on MiraLax to help with the constipation issues.  A Foley was discontinued and voiding was monitored.  PVR checks were noted to be low.  Urine culture was done and this was negative.  The patient's Flomax was changed to q.a.m. and the patient was continued on her Benadryl at nighttime to help with her Frequency/urgency symptoms at night.  She was also started on Valium to help with her neck pain as well as anxiety issues.  Blood pressures were monitored on b.i.d. basis and these have been relatively stable.  P.o. intake has been good and the patient has been continent of bowel and bladder.  Neck incision has healed well without any signs or symptoms of infection.  During the patient's stay in Rehab, team conference was held to monitor the patient's progress, set goals, as well as discuss barriers to discharge.  OT has worked with the patient on ADL tasks.  The patient made excellent progress and was modified independent for bathing, dressing, toileting as well  as toileting transfers.  She requires supervision for tub transfers.  PT has worked with the patient on mobility, strengthening, and balance.  The patient was able to ambulate greater than 400 feet with rolling walker with supervision.  Gait in controlled environment without rolling walker required supervision. She was able to ambulate greater than 600 feet in controlled environment with rolling walker at modified independent level.  Husband can provide assistance as needed past discharge.  Further followup outpatient PT to continue to address  balance past discharge.  On July 19, 2012, the patient is discharged to home.  DISCHARGE MEDICATIONS: 1. Tylenol 325-650 mg p.o. q.4 hours p.r.n. pain. 2. Valium 2 mg p.o. at bedtime, #15 Rx. 3. Robaxin 750 mg p.o. q.6 hours p.r.n. spasms. 4. Florastor 1 p.o. b.i.d. 5. Flomax 0.4 mg p.o. q.a.m. 6. Aspirin 81 mg p.o. per day. 7. Excedrin Migraine 1 p.o. q.6 hours p.r.n. migraines. 8. Fioricet 1 tab p.o. b.i.d. p.r.n. 9. Calcium with vitamin b.i.d. 10.Dramamine 50 mg p.o. q.8 hours p.r.n. nausea. 11.Benadryl 25 mg p.o. at bedtime. 12.Ogen 0.75 mg p.o. per day. 13.Ibuprofen 800 mg p.o. q.8 hours p.r.n. pain. 14.Claritin 10 mg p.o. per day. 15.Provera 2.5 mg p.o. per day. 16.Vitamin B one p.o. per day. 17.Inderal 40 mg p.o. per day. 18.Prednisone 10 mg p.o. p.r.n. 19.Multivitamin 1 p.o. per day. 20.Omeprazole 20 mg p.o. per day. 21.Vitamin C 1000 mg p.o. per day.  DIET:  Regular.  ACTIVITY:  As tolerated with use of walker, intermittent supervision.  SPECIAL INSTRUCTIONS:  Outpatient PT, [facility] on July 24, 2012.  No driving until cleared by MD.  Elenor Quinones:  The patient to follow up with Dr. Riley Kill on August 06, 2012, at 9:30 a.m.; follow up with Dr. Gershon Crane for posthospital check on July 31, 2012, at 3:15; follow up with Dr. Barnett Abu for postop check in the next 2 weeks.     Delle Reining, P.A.   ______________________________ Ranelle Oyster, M.D.    PL/MEDQ  D:  07/22/2012  T:  07/23/2012  Job:  [id number]  cc:   Stefani Dama, M.D. Tera Mater. Clent Ridges, MD

## 2012-07-24 ENCOUNTER — Ambulatory Visit: Payer: 59 | Attending: Physical Medicine & Rehabilitation | Admitting: Occupational Therapy

## 2012-07-24 ENCOUNTER — Ambulatory Visit: Payer: 59 | Admitting: Physical Therapy

## 2012-07-24 DIAGNOSIS — Z5189 Encounter for other specified aftercare: Secondary | ICD-10-CM | POA: Insufficient documentation

## 2012-07-24 DIAGNOSIS — R269 Unspecified abnormalities of gait and mobility: Secondary | ICD-10-CM | POA: Insufficient documentation

## 2012-07-24 DIAGNOSIS — M6281 Muscle weakness (generalized): Secondary | ICD-10-CM | POA: Insufficient documentation

## 2012-07-24 DIAGNOSIS — R279 Unspecified lack of coordination: Secondary | ICD-10-CM | POA: Insufficient documentation

## 2012-07-24 DIAGNOSIS — M2550 Pain in unspecified joint: Secondary | ICD-10-CM | POA: Insufficient documentation

## 2012-07-30 ENCOUNTER — Ambulatory Visit: Payer: 59 | Attending: Physical Medicine & Rehabilitation | Admitting: Occupational Therapy

## 2012-07-30 DIAGNOSIS — Z5189 Encounter for other specified aftercare: Secondary | ICD-10-CM | POA: Insufficient documentation

## 2012-07-30 DIAGNOSIS — M2550 Pain in unspecified joint: Secondary | ICD-10-CM | POA: Insufficient documentation

## 2012-07-30 DIAGNOSIS — R269 Unspecified abnormalities of gait and mobility: Secondary | ICD-10-CM | POA: Insufficient documentation

## 2012-07-30 DIAGNOSIS — M6281 Muscle weakness (generalized): Secondary | ICD-10-CM | POA: Insufficient documentation

## 2012-07-30 DIAGNOSIS — R279 Unspecified lack of coordination: Secondary | ICD-10-CM | POA: Insufficient documentation

## 2012-07-31 ENCOUNTER — Ambulatory Visit (INDEPENDENT_AMBULATORY_CARE_PROVIDER_SITE_OTHER): Payer: 59 | Admitting: Family Medicine

## 2012-07-31 ENCOUNTER — Ambulatory Visit: Payer: 59 | Admitting: *Deleted

## 2012-07-31 ENCOUNTER — Encounter: Payer: Self-pay | Admitting: Family Medicine

## 2012-07-31 ENCOUNTER — Ambulatory Visit: Payer: 59 | Admitting: Occupational Therapy

## 2012-07-31 VITALS — BP 110/64 | HR 82 | Temp 99.3°F | Wt 173.0 lb

## 2012-07-31 DIAGNOSIS — M5090 Cervical disc disorder, unspecified, unspecified cervical region: Secondary | ICD-10-CM

## 2012-07-31 DIAGNOSIS — S064X9A Epidural hemorrhage with loss of consciousness of unspecified duration, initial encounter: Secondary | ICD-10-CM

## 2012-07-31 DIAGNOSIS — N3281 Overactive bladder: Secondary | ICD-10-CM

## 2012-07-31 DIAGNOSIS — S064XAA Epidural hemorrhage with loss of consciousness status unknown, initial encounter: Secondary | ICD-10-CM

## 2012-07-31 DIAGNOSIS — N318 Other neuromuscular dysfunction of bladder: Secondary | ICD-10-CM

## 2012-07-31 NOTE — Progress Notes (Signed)
  Subjective:    Patient ID: Victoria Mosley, female    DOB: 03-29-1951, 61 y.o.   MRN: [id number]  HPI Here to follow up after a hospital stay from 07-08-12 to 07-19-12. On 07-08-12 she underwent an epidural steroid injection to the cervical spine to help with pain stemming from a herniated disc at the C4-C5 level. Shortly after the injection she developed progressive loss of use of legs and arms, and was sent to the ER. She was found to have a large epidural hematoma, and this was surgically corrected on 07-09-12 by Dr. Danielle Dess. The hematoma was evacuated and she had laminectomies performed at C7-T1 and at T1-T2. This was very successful, and she has been recovering since. She had an inpatient rehab stay under the care of Dr. Faith Rogue, and since coming home she has had outpatient PT every day. She walks with a walker. So far her neck pain has been fairly well controlled. She had problems with bowel movements and urinations in the hospital, but these are much improved now. Her bowels move well. She is taking Flomax daily, and this helps with her long standing urinary urgency and frequency. However she has had a lot of blurred vision the past few weeks and she is convinced this is a side effect or Flomax. This is the only medication that has ever helped her urinary issues, and she has tried just about everything else. She is due to see Urology in a few days. She is taking Tylenol for her chronic HAs.    Review of Systems  Respiratory: Negative.   Cardiovascular: Negative.   Gastrointestinal: Negative.   Genitourinary: Positive for frequency. Negative for dysuria, hematuria, flank pain and pelvic pain.  Neurological: Positive for weakness and headaches.       Objective:   Physical Exam  Constitutional: She is oriented to person, place, and time. She appears well-developed and well-nourished.  Eyes: Conjunctivae normal and EOM are normal. Pupils are equal, round, and reactive to light.  Neck: No  thyromegaly present.  Cardiovascular: Normal rate, regular rhythm, normal heart sounds and intact distal pulses.   Pulmonary/Chest: Effort normal and breath sounds normal.  Lymphadenopathy:    She has no cervical adenopathy.  Neurological: She is alert and oriented to person, place, and time.          Assessment & Plan:  She is recovering well from her surgery and she will follow up with Dr. Riley Kill. She will discuss alternatives to Flomax with Urology.

## 2012-08-02 ENCOUNTER — Ambulatory Visit: Payer: 59 | Admitting: Physical Therapy

## 2012-08-06 ENCOUNTER — Encounter: Payer: 59 | Attending: Physical Medicine & Rehabilitation | Admitting: Physical Medicine & Rehabilitation

## 2012-08-06 ENCOUNTER — Encounter: Payer: Self-pay | Admitting: Physical Medicine & Rehabilitation

## 2012-08-06 VITALS — BP 123/50 | HR 70 | Resp 14 | Ht 67.0 in | Wt 171.0 lb

## 2012-08-06 DIAGNOSIS — G43109 Migraine with aura, not intractable, without status migrainosus: Secondary | ICD-10-CM | POA: Insufficient documentation

## 2012-08-06 DIAGNOSIS — S064XAA Epidural hemorrhage with loss of consciousness status unknown, initial encounter: Secondary | ICD-10-CM

## 2012-08-06 DIAGNOSIS — G8381 Brown-Sequard syndrome: Secondary | ICD-10-CM | POA: Insufficient documentation

## 2012-08-06 DIAGNOSIS — K219 Gastro-esophageal reflux disease without esophagitis: Secondary | ICD-10-CM | POA: Insufficient documentation

## 2012-08-06 DIAGNOSIS — I621 Nontraumatic extradural hemorrhage: Secondary | ICD-10-CM | POA: Insufficient documentation

## 2012-08-06 DIAGNOSIS — G43119 Migraine with aura, intractable, without status migrainosus: Secondary | ICD-10-CM

## 2012-08-06 DIAGNOSIS — N318 Other neuromuscular dysfunction of bladder: Secondary | ICD-10-CM | POA: Insufficient documentation

## 2012-08-06 DIAGNOSIS — N319 Neuromuscular dysfunction of bladder, unspecified: Secondary | ICD-10-CM

## 2012-08-06 DIAGNOSIS — G8389 Other specified paralytic syndromes: Secondary | ICD-10-CM | POA: Insufficient documentation

## 2012-08-06 DIAGNOSIS — S064X9A Epidural hemorrhage with loss of consciousness of unspecified duration, initial encounter: Secondary | ICD-10-CM

## 2012-08-06 DIAGNOSIS — E785 Hyperlipidemia, unspecified: Secondary | ICD-10-CM | POA: Insufficient documentation

## 2012-08-06 MED ORDER — GABAPENTIN 100 MG PO CAPS: 100.0000 mg | ORAL_CAPSULE | Freq: Three times a day (TID) | ORAL | Status: DC

## 2012-08-06 NOTE — Progress Notes (Signed)
Subjective:    Patient ID: Victoria Mosley, female    DOB: 1951/03/31, 61 y.o.   MRN: [id number]  HPI  Pt is back in follow up after her cervical EDH. She has been doing fairly well except for an ingrown toe nail. She has been working with Outpt PT and OT to work on strength, ROM, dexterity of her right greater than left side. She is still walking with a cane. Her balance is better.   Her right shoulder still is bothering her but is not like it was before prior to injection and surgery. She is fearful that she will need further surgery and wants to try anything she can short of surgery in order to avoid it.   Her headaches have been an issue since going home. They have been more consistent since her surgery and hospitalization. She has pain in her neck and over/between her eyes. She typically awakens with a migraine (with visual aura) and takes a fioricet and a tylenol in the morning which tends to knock out the pain. She uses 4-5 more tylenol during the day for other areas of pain.   Weather, sleep, appetite seem to trigger her headache. She also has had  Blurred vision since leaving from the hospital and wonders if it's from the flomax.   Pain Inventory Average Pain 5 Pain Right Now 5 My pain is constant, sharp, burning, dull, stabbing and aching  In the last 24 hours, has pain interfered with the following? General activity 5 Relation with others 5 Enjoyment of life 5 What TIME of day is your pain at its worst? day and night Sleep (in general) Fair  Pain is worse with: some activites Pain improves with: rest, heat/ice, medication and injections Relief from Meds: 5  Mobility walk without assistance use a cane ability to climb steps?  yes do you drive?  yes Do you have any goals in this area?  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   no  Neuro/Psych weakness trouble walking  Prior Studies Any changes since last visit?  no bone scan x-rays CT/MRI  Physicians involved in your care Any changes since last visit?  no   Family History  Problem Relation Age of Onset  . Heart disease Mother   . Hypertension Mother   . Diabetes Mother   . Depression Father   . Hypertension Father   . Leukemia Father   . Hypertension Sister   . Heart disease Sister   . Cancer Sister     bladder  . Stomach cancer Paternal Grandmother   . Colon cancer Neg Hx   . Esophageal cancer Neg Hx   . Rectal cancer Neg Hx    History   Social History  . Marital Status: Married    Spouse Name: N/A    Number of Children: N/A  . Years of Education: N/A   Social History Main Topics  . Smoking status: Former Games developer  . Smokeless tobacco: Never Used  . Alcohol Use: No  . Drug Use: No  . Sexually Active: Yes -- Female partner(s)    Birth Control/ Protection: Post-menopausal   Other Topics Concern  . None   Social History Narrative  . None   Past Surgical History  Procedure Date  . Melanoma excision 2007    left upper arm per dr. Lonni Fix  . Foot surgery 1987  per Dr. Chaney Malling  . Nasal septum surgery 1992    per Dr. Berna Bue  . Lithotripsy 2007  . Lumbar laminectomy 1993    1993, at L5 per Dr. Roxan Hockey  . Colonoscopy 03-28-12    per Dr. Marina Goodell, polyp,  repeat in 5 yrs  . Cesarean section   . Tubal ligation   . Kidney stone surgery 2005    per Dr. Wanda Plump  . Total hip arthroplasty 2010    on right per Dr. Lequita Halt  . Posterior cervical laminectomy 07/08/2012    Procedure: POSTERIOR CERVICAL LAMINECTOMY;  Surgeon: Barnett Abu, MD;  Location: [facility];  Service: Neurosurgery;  Laterality: N/A;  Cervical Seven- thoracic One Laminectomy and Decompression of Epidural Hematoma  . Hematoma evacuation 07/08/2012    Procedure: EVACUATION HEMATOMA;  Surgeon: Barnett Abu, MD;  Location: [facility];  Service: Neurosurgery;   Laterality: N/A;  Cervical Seven-thoracic One Laminectomy and Decompression of Epidural Hematoma   Past Medical History  Diagnosis Date  . Menopause     sees Dr. Audie Box  . 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 Migraines   . GERD (gastroesophageal reflux disease)   . Allergy   . IBS (irritable bowel syndrome)   . Arthritis   . Hyperlipidemia   . Diverticulitis   . Kidney stones   . Abnormal EKG 2012    stress test normal  . UTI (urinary tract infection)     started atb on 03-24-12  . Cancer   . Bilateral knee pain     sees Dr. Lequita Halt    BP 123/50  Pulse 70  Resp 14  Ht 5\' 7"  (1.702 m)  Wt 171 lb (77.565 kg)  BMI 26.78 kg/m2  SpO2 96%     Review of Systems  HENT: Positive for neck pain.   Musculoskeletal: Positive for myalgias, arthralgias and gait problem.  Neurological: Positive for weakness.  All other systems reviewed and are negative.       Objective:   Physical Exam  General: Alert and oriented x 3, No apparent distress HEENT: Head is normocephalic, atraumatic, PERRLA, EOMI, sclera anicteric, oral mucosa pink and moist, dentition intact, ext ear canals clear,  Neck: Supple without JVD or lymphadenopathy Heart: Reg rate and rhythm. No murmurs rubs or gallops Chest: CTA bilaterally without wheezes, rales, or rhonchi; no distress Abdomen: Soft, non-tender, non-distended, bowel sounds positive. Extremities: No clubbing, cyanosis, or edema. Pulses are 2+ Skin: Clean and intact without signs of breakdown. Wound well healed Neuro: Pt is cognitively appropriate with normal insight, memory, and awareness. Cranial nerves 2-12 are intact. Sensory exam is normal.   Fine motor coordination is slgihtly diminished in the right hand and foot.. No tremors. Motor function is grossly 4+ to 5/5.  Musculoskeletal: Cervical ROM limited with right rotation and flexion/extention. She has spasm in the traps and levator scapulae bilaterally.she has pain with  palpation over the peri-operative  Psych: Pt's affect is appropriate. Pt is cooperative        Assessment & Plan:  ASSESSMENT: 1. Epidural cervical hematoma causing Brown-Sequard syndrome.  2. Hyperactive spastic bladder, urinary frequency 3. History of migraines with aura. I feel her C-spine/myofascial pain have been big triggers for these. Recent visual changes also are contributing to more frequent headaches i believe.  4. Insomnia.  Plan: 1. Split inderal to 20mg  bid 2. Begin a trial of neurontin beginning at 100mg  qhs then titrating to TID over 8 days time  for prophylaxis and to potentially help with cervical/referred pain. 3. Continue with PT to address cervical ROM, massage, posture, strength, etc 4. Continue with flomax for now. It could be contributing to her blurred vision and secondarily headaches as well although it has helped her voiding patterns. Ultimately defer to urology regarding plan and other medication/treatment options. 5. Continue to focus on sleep hygiene. 6. Return to see me in 6 weeks.

## 2012-08-06 NOTE — Patient Instructions (Signed)
SPLIT YOUR INDERAL INTO 20MG  DOSES AND TAKE IN THE AM AND AT BEDTIME.  BEGIN THE NEURONTIN TRIAL IN ABOUT 3-4 DAYS.  FOLLOW UP WITH YOUR UROLOGIST REGARDING YOUR URINARY FREQUENCY

## 2012-08-07 ENCOUNTER — Ambulatory Visit: Payer: 59 | Admitting: Physical Therapy

## 2012-08-07 ENCOUNTER — Ambulatory Visit: Payer: 59 | Admitting: Occupational Therapy

## 2012-08-09 ENCOUNTER — Ambulatory Visit: Payer: 59 | Admitting: *Deleted

## 2012-08-09 ENCOUNTER — Ambulatory Visit: Payer: 59 | Admitting: Physical Therapy

## 2012-08-13 ENCOUNTER — Ambulatory Visit: Payer: 59 | Admitting: Physical Therapy

## 2012-08-13 ENCOUNTER — Ambulatory Visit: Payer: 59 | Admitting: Occupational Therapy

## 2012-08-16 ENCOUNTER — Ambulatory Visit: Payer: 59 | Admitting: Occupational Therapy

## 2012-08-16 ENCOUNTER — Ambulatory Visit: Payer: 59 | Admitting: Physical Therapy

## 2012-08-27 ENCOUNTER — Ambulatory Visit: Payer: 59 | Admitting: Occupational Therapy

## 2012-08-27 ENCOUNTER — Ambulatory Visit: Payer: 59 | Admitting: Physical Therapy

## 2012-08-29 ENCOUNTER — Ambulatory Visit: Payer: 59 | Admitting: Occupational Therapy

## 2012-08-29 ENCOUNTER — Ambulatory Visit: Payer: 59 | Admitting: Physical Therapy

## 2012-09-03 ENCOUNTER — Ambulatory Visit: Payer: 59 | Admitting: Physical Therapy

## 2012-09-03 ENCOUNTER — Ambulatory Visit: Payer: 59 | Attending: Physical Medicine & Rehabilitation | Admitting: Occupational Therapy

## 2012-09-03 DIAGNOSIS — M6281 Muscle weakness (generalized): Secondary | ICD-10-CM | POA: Insufficient documentation

## 2012-09-03 DIAGNOSIS — M2550 Pain in unspecified joint: Secondary | ICD-10-CM | POA: Insufficient documentation

## 2012-09-03 DIAGNOSIS — IMO0001 Reserved for inherently not codable concepts without codable children: Secondary | ICD-10-CM | POA: Insufficient documentation

## 2012-09-03 DIAGNOSIS — R279 Unspecified lack of coordination: Secondary | ICD-10-CM | POA: Insufficient documentation

## 2012-09-05 ENCOUNTER — Ambulatory Visit: Payer: 59 | Admitting: Physical Therapy

## 2012-09-05 ENCOUNTER — Ambulatory Visit: Payer: 59 | Admitting: Occupational Therapy

## 2012-09-10 ENCOUNTER — Ambulatory Visit: Payer: 59 | Admitting: Physical Therapy

## 2012-09-10 ENCOUNTER — Encounter: Payer: 59 | Admitting: Occupational Therapy

## 2012-09-12 ENCOUNTER — Encounter: Payer: 59 | Admitting: Occupational Therapy

## 2012-09-12 ENCOUNTER — Ambulatory Visit: Payer: 59 | Admitting: Physical Therapy

## 2012-09-17 ENCOUNTER — Encounter: Payer: Self-pay | Admitting: Physical Medicine & Rehabilitation

## 2012-09-17 ENCOUNTER — Encounter: Payer: 59 | Attending: Physical Medicine & Rehabilitation | Admitting: Physical Medicine & Rehabilitation

## 2012-09-17 VITALS — BP 135/65 | HR 69 | Resp 14 | Ht 67.0 in | Wt 176.4 lb

## 2012-09-17 DIAGNOSIS — G43109 Migraine with aura, not intractable, without status migrainosus: Secondary | ICD-10-CM | POA: Insufficient documentation

## 2012-09-17 DIAGNOSIS — N318 Other neuromuscular dysfunction of bladder: Secondary | ICD-10-CM | POA: Insufficient documentation

## 2012-09-17 DIAGNOSIS — N319 Neuromuscular dysfunction of bladder, unspecified: Secondary | ICD-10-CM

## 2012-09-17 DIAGNOSIS — I621 Nontraumatic extradural hemorrhage: Secondary | ICD-10-CM | POA: Insufficient documentation

## 2012-09-17 DIAGNOSIS — E785 Hyperlipidemia, unspecified: Secondary | ICD-10-CM | POA: Insufficient documentation

## 2012-09-17 DIAGNOSIS — G8381 Brown-Sequard syndrome: Secondary | ICD-10-CM

## 2012-09-17 DIAGNOSIS — K219 Gastro-esophageal reflux disease without esophagitis: Secondary | ICD-10-CM | POA: Insufficient documentation

## 2012-09-17 DIAGNOSIS — G43119 Migraine with aura, intractable, without status migrainosus: Secondary | ICD-10-CM

## 2012-09-17 DIAGNOSIS — G8389 Other specified paralytic syndromes: Secondary | ICD-10-CM | POA: Insufficient documentation

## 2012-09-17 NOTE — Progress Notes (Signed)
Subjective:    Patient ID: Victoria Mosley, female    DOB: 01-30-1951, 61 y.o.   MRN: [id number]  HPI  Mrs. Arcos is back regarding her cervical EDH. She has graduated from therapies. She is back to work now for just over 2 weeks currently. Her biggest complaint is fatigue but she feels that it's improving.  The flomax was stopped and this improved her vision. She's on uroxatrol currently which seems to be helping. She apparently has a few kidney stones which urology is following. She's also had a recent UTI.   She never started the gabapentin because the cessation of the flomax helped her vision and subsequently helped her headaches. She still has some pain in her shoulders, neck but it's more intermittent than anything. She tries to stretch, take rest breaks regularly.   Overall, she is quite happy with where she's at currenlty. Dr. Danielle Dess will check another scan of her spine in December.   Pain Inventory Average Pain 4 Pain Right Now 3 My pain is intermittent, sharp, burning, dull, stabbing and aching  In the last 24 hours, has pain interfered with the following? General activity 4 Relation with others 4 Enjoyment of life 4 What TIME of day is your pain at its worst? varies Sleep (in general) Fair  Pain is worse with: walking, bending, standing and some activites Pain improves with: rest, heat/ice, therapy/exercise and medication Relief from Meds: 7  Mobility walk without assistance ability to climb steps?  yes do you drive?  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  yes CT/MRI  Kidney stones  Physicians involved in your care [facility] urologist   Family History  Problem Relation Age of Onset  . Heart disease Mother   . Hypertension Mother   . Diabetes Mother     . Depression Father   . Hypertension Father   . Leukemia Father   . Hypertension Sister   . Heart disease Sister   . Cancer Sister     bladder  . Stomach cancer Paternal Grandmother   . Colon cancer Neg Hx   . Esophageal cancer Neg Hx   . Rectal cancer Neg Hx    History   Social History  . Marital Status: Married    Spouse Name: N/A    Number of Children: N/A  . Years of Education: N/A   Social History Main Topics  . Smoking status: Former Games developer  . Smokeless tobacco: Never Used  . Alcohol Use: No  . Drug Use: No  . Sexually Active: Yes -- Female partner(s)    Birth Control/ Protection: Post-menopausal   Other Topics Concern  . None   Social History Narrative  . None   Past Surgical History  Procedure Date  . Melanoma excision 2007    left upper arm per dr. Lonni Fix  . Foot surgery 1987    per Dr. Chaney Malling  . Nasal septum surgery 1992    per Dr. Berna Bue  . Lithotripsy 2007  . Lumbar laminectomy 1993    1993, at L5 per Dr. Roxan Hockey  . Colonoscopy 03-28-12    per Dr. Marina Goodell, polyp,  repeat in 5 yrs  . Cesarean section   . Tubal ligation   . Kidney stone surgery 2005    per Dr. Wanda Plump  . Total hip  arthroplasty 2010    on right per Dr. Lequita Halt  . Posterior cervical laminectomy 07/08/2012    Procedure: POSTERIOR CERVICAL LAMINECTOMY;  Surgeon: Barnett Abu, MD;  Location: [facility];  Service: Neurosurgery;  Laterality: N/A;  Cervical Seven- thoracic One Laminectomy and Decompression of Epidural Hematoma  . Hematoma evacuation 07/08/2012    Procedure: EVACUATION HEMATOMA;  Surgeon: Barnett Abu, MD;  Location: [facility];  Service: Neurosurgery;  Laterality: N/A;  Cervical Seven-thoracic One Laminectomy and Decompression of Epidural Hematoma   Past Medical History  Diagnosis Date  . Menopause     sees Dr. Audie Box  . 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 Migraines   . GERD (gastroesophageal reflux disease)   . Allergy   . IBS (irritable  bowel syndrome)   . Arthritis   . Hyperlipidemia   . Diverticulitis   . Kidney stones   . Abnormal EKG 2012    stress test normal  . UTI (urinary tract infection)     started atb on 03-24-12  . Cancer   . Bilateral knee pain     sees Dr. Lequita Halt    BP 135/65  Pulse 69  Resp 14  Ht 5\' 7"  (1.702 m)  Wt 176 lb 6.4 oz (80.015 kg)  BMI 27.63 kg/m2  SpO2 97%   Review of Systems  Gastrointestinal: Positive for constipation.  Genitourinary: Positive for difficulty urinating.  Musculoskeletal: Positive for back pain and gait problem.  Neurological: Positive for weakness.  Hematological: Bruises/bleeds easily.  Psychiatric/Behavioral: The patient is nervous/anxious.        Objective:   Physical Exam General: Alert and oriented x 3, No apparent distress  HEENT: Head is normocephalic, atraumatic, PERRLA, EOMI, sclera anicteric, oral mucosa pink and moist, dentition intact, ext ear canals clear,  Neck: Supple without JVD or lymphadenopathy  Heart: Reg rate and rhythm. No murmurs rubs or gallops  Chest: CTA bilaterally without wheezes, rales, or rhonchi; no distress  Abdomen: Soft, non-tender, non-distended, bowel sounds positive.  Extremities: No clubbing, cyanosis, or edema. Pulses are 2+  Skin: Clean and intact without signs of breakdown. Wound well healed  Neuro: Pt is cognitively appropriate with normal insight, memory, and awareness. Cranial nerves 2-12 are intact. Sensory exam is normal. Fine motor coordination is slgihtly diminished in the right hand and foot.. No tremors. Motor function is grossly   5/5.  Musculoskeletal: Cervical ROM limited with right rotation and flexion/extention. She has spasm in the traps and levator scapulae bilaterally to small extent.  Posture is good. Marland Kitchen Psych: Pt's affect is appropriate. Pt is cooperative   Assessment & Plan:   ASSESSMENT:  1. Epidural cervical hematoma causing Brown-Sequard syndrome.-symptoms have largely resolved. 2. Hyperactive  spastic bladder, urinary frequency  3. History of migraines with aura. I feel her C-spine/myofascial pain have been big triggers for these. Recent visual changes have resolved off of flomax which has helped her headaches.  4. Insomnia.   Plan:  1. Continue inderal in the evening to help with her sleep, headaches  2.  Might benefit from a cervical traction collar down the line if symptoms recur 3. Continue home exercises to address cervical ROM, massage, posture, strength, etc  4. Continue with uroxatral per urology. Rx UTI's as needed.  5. Follow up with me in 6 months. All questions were encouraged and answered.

## 2012-09-17 NOTE — Patient Instructions (Signed)
MAINTAIN YOUR HOME EXERCISE PROGRAM, POSTURE.

## 2012-09-18 ENCOUNTER — Encounter: Payer: 59 | Admitting: Occupational Therapy

## 2012-09-18 ENCOUNTER — Ambulatory Visit: Payer: 59 | Admitting: Physical Therapy

## 2012-09-20 ENCOUNTER — Encounter: Payer: 59 | Admitting: Occupational Therapy

## 2012-09-20 ENCOUNTER — Ambulatory Visit: Payer: 59 | Admitting: Physical Therapy

## 2012-09-24 ENCOUNTER — Ambulatory Visit: Payer: 59 | Admitting: Physical Therapy

## 2012-09-24 ENCOUNTER — Encounter: Payer: 59 | Admitting: Occupational Therapy

## 2012-11-15 ENCOUNTER — Encounter: Payer: Self-pay | Admitting: Gynecology

## 2012-12-05 ENCOUNTER — Other Ambulatory Visit: Payer: Self-pay | Admitting: Urology

## 2012-12-19 ENCOUNTER — Encounter (HOSPITAL_BASED_OUTPATIENT_CLINIC_OR_DEPARTMENT_OTHER): Payer: Self-pay | Admitting: *Deleted

## 2012-12-20 ENCOUNTER — Encounter (HOSPITAL_BASED_OUTPATIENT_CLINIC_OR_DEPARTMENT_OTHER): Payer: Self-pay | Admitting: *Deleted

## 2012-12-20 NOTE — Progress Notes (Signed)
Pt instructed npo p mn 2/25.  To [facility] 2/26 @ 0700.  Needs hgb on arrival.  ekg in epic.

## 2012-12-25 ENCOUNTER — Ambulatory Visit (HOSPITAL_BASED_OUTPATIENT_CLINIC_OR_DEPARTMENT_OTHER)
Admission: RE | Admit: 2012-12-25 | Discharge: 2012-12-25 | Disposition: A | Discharge status: Home / Self Care | Payer: 59 | Source: Ambulatory Visit | Attending: Urology | Admitting: Urology

## 2012-12-25 ENCOUNTER — Encounter (HOSPITAL_BASED_OUTPATIENT_CLINIC_OR_DEPARTMENT_OTHER): Payer: Self-pay | Admitting: Anesthesiology

## 2012-12-25 ENCOUNTER — Encounter (HOSPITAL_BASED_OUTPATIENT_CLINIC_OR_DEPARTMENT_OTHER)
Admission: RE | Disposition: A | Discharge status: Home / Self Care | Payer: Self-pay | Source: Ambulatory Visit | Attending: Urology

## 2012-12-25 ENCOUNTER — Encounter (HOSPITAL_BASED_OUTPATIENT_CLINIC_OR_DEPARTMENT_OTHER): Payer: Self-pay | Admitting: *Deleted

## 2012-12-25 ENCOUNTER — Ambulatory Visit (HOSPITAL_BASED_OUTPATIENT_CLINIC_OR_DEPARTMENT_OTHER): Payer: 59 | Admitting: Anesthesiology

## 2012-12-25 DIAGNOSIS — Z79899 Other long term (current) drug therapy: Secondary | ICD-10-CM | POA: Insufficient documentation

## 2012-12-25 DIAGNOSIS — Z96649 Presence of unspecified artificial hip joint: Secondary | ICD-10-CM | POA: Insufficient documentation

## 2012-12-25 DIAGNOSIS — N200 Calculus of kidney: Secondary | ICD-10-CM | POA: Insufficient documentation

## 2012-12-25 DIAGNOSIS — Z7982 Long term (current) use of aspirin: Secondary | ICD-10-CM | POA: Insufficient documentation

## 2012-12-25 DIAGNOSIS — E785 Hyperlipidemia, unspecified: Secondary | ICD-10-CM | POA: Insufficient documentation

## 2012-12-25 DIAGNOSIS — K219 Gastro-esophageal reflux disease without esophagitis: Secondary | ICD-10-CM | POA: Insufficient documentation

## 2012-12-25 HISTORY — DX: Other specified postprocedural states: Z98.890

## 2012-12-25 HISTORY — DX: Calculus of kidney: N20.0

## 2012-12-25 HISTORY — DX: Personal history of malignant melanoma of skin: Z85.820

## 2012-12-25 HISTORY — PX: CYSTOSCOPY WITH RETROGRADE PYELOGRAM, URETEROSCOPY AND STENT PLACEMENT: SHX5789

## 2012-12-25 HISTORY — DX: Cyst of kidney, acquired: N28.1

## 2012-12-25 HISTORY — DX: Neuromuscular dysfunction of bladder, unspecified: N31.9

## 2012-12-25 HISTORY — DX: Urgency of urination: R39.15

## 2012-12-25 HISTORY — DX: Reserved for inherently not codable concepts without codable children: IMO0001

## 2012-12-25 HISTORY — DX: Personal history of urinary calculi: Z87.442

## 2012-12-25 LAB — POCT I-STAT, CHEM 8
BUN: 15 mg/dL (ref 6–23)
Calcium, Ion: 1.23 mmol/L (ref 1.13–1.30)
Chloride: 108 mEq/L (ref 96–112)
Creatinine, Ser: 0.7 mg/dL (ref 0.50–1.10)
Glucose, Bld: 107 mg/dL — ABNORMAL HIGH (ref 70–99)
HCT: 41 % (ref 36.0–46.0)
Hemoglobin: 13.9 g/dL (ref 12.0–15.0)
Potassium: 3.9 mEq/L (ref 3.5–5.1)
Sodium: 144 mEq/L (ref 135–145)
TCO2: 26 mmol/L (ref 0–100)

## 2012-12-25 SURGERY — CYSTOURETEROSCOPY, WITH RETROGRADE PYELOGRAM AND STENT INSERTION
Anesthesia: General | Site: Ureter | Laterality: Left

## 2012-12-25 MED ORDER — SODIUM CHLORIDE 0.9 % IR SOLN
Status: DC | PRN
  Administered 2012-12-25: 6000 mL

## 2012-12-25 MED ORDER — PROPOFOL 10 MG/ML IV BOLUS
INTRAVENOUS | Status: DC | PRN
  Administered 2012-12-25: 200 mg via INTRAVENOUS

## 2012-12-25 MED ORDER — PROMETHAZINE HCL 25 MG/ML IJ SOLN
6.2500 mg | INTRAMUSCULAR | Status: DC | PRN
  Filled 2012-12-25: qty 1

## 2012-12-25 MED ORDER — OXYCODONE HCL 5 MG PO TABS: 5.0000 mg | ORAL_TABLET | ORAL | Status: DC | PRN

## 2012-12-25 MED ORDER — MIDAZOLAM HCL 5 MG/5ML IJ SOLN
INTRAMUSCULAR | Status: DC | PRN
  Administered 2012-12-25: 2 mg via INTRAVENOUS

## 2012-12-25 MED ORDER — BELLADONNA ALKALOIDS-OPIUM 16.2-60 MG RE SUPP
RECTAL | Status: DC | PRN
  Administered 2012-12-25: 1 via RECTAL

## 2012-12-25 MED ORDER — IOHEXOL 350 MG/ML SOLN
INTRAVENOUS | Status: DC | PRN
  Administered 2012-12-25: 10 mL via URETHRAL

## 2012-12-25 MED ORDER — LIDOCAINE HCL (CARDIAC) 20 MG/ML IV SOLN
INTRAVENOUS | Status: DC | PRN
  Administered 2012-12-25: 80 mg via INTRAVENOUS

## 2012-12-25 MED ORDER — FENTANYL CITRATE 0.05 MG/ML IJ SOLN
25.0000 ug | INTRAMUSCULAR | Status: DC | PRN
  Filled 2012-12-25: qty 1

## 2012-12-25 MED ORDER — AMPICILLIN SODIUM 2 G IJ SOLR
2.0000 g | INTRAMUSCULAR | Status: AC
  Administered 2012-12-25: 2 g via INTRAVENOUS
  Filled 2012-12-25: qty 2000

## 2012-12-25 MED ORDER — PHENAZOPYRIDINE HCL 100 MG PO TABS: 100.0000 mg | ORAL_TABLET | Freq: Three times a day (TID) | ORAL | Status: DC | PRN

## 2012-12-25 MED ORDER — LIDOCAINE HCL 2 % EX GEL
CUTANEOUS | Status: DC | PRN
  Administered 2012-12-25: 1 via URETHRAL

## 2012-12-25 MED ORDER — ONDANSETRON HCL 4 MG/2ML IJ SOLN
INTRAMUSCULAR | Status: DC | PRN
  Administered 2012-12-25: 4 mg via INTRAVENOUS

## 2012-12-25 MED ORDER — KETOROLAC TROMETHAMINE 30 MG/ML IJ SOLN
INTRAMUSCULAR | Status: DC | PRN
  Administered 2012-12-25: 30 mg via INTRAVENOUS

## 2012-12-25 MED ORDER — EPHEDRINE SULFATE 50 MG/ML IJ SOLN
INTRAMUSCULAR | Status: DC | PRN
  Administered 2012-12-25: 5 mg via INTRAVENOUS

## 2012-12-25 MED ORDER — LACTATED RINGERS IV SOLN
INTRAVENOUS | Status: DC
  Administered 2012-12-25 (×2): via INTRAVENOUS
  Filled 2012-12-25: qty 1000

## 2012-12-25 MED ORDER — GENTAMICIN SULFATE 40 MG/ML IJ SOLN
1.5000 mg/kg | INTRAVENOUS | Status: AC
  Administered 2012-12-25: 400 mg via INTRAVENOUS
  Filled 2012-12-25: qty 3

## 2012-12-25 MED ORDER — CEPHALEXIN 500 MG PO CAPS: 500.0000 mg | ORAL_CAPSULE | Freq: Three times a day (TID) | ORAL | Status: DC

## 2012-12-25 MED ORDER — DEXAMETHASONE SODIUM PHOSPHATE 4 MG/ML IJ SOLN
INTRAMUSCULAR | Status: DC | PRN
  Administered 2012-12-25: 10 mg via INTRAVENOUS

## 2012-12-25 MED ORDER — ACETAMINOPHEN 10 MG/ML IV SOLN
INTRAVENOUS | Status: DC | PRN
  Administered 2012-12-25: 1000 mg via INTRAVENOUS

## 2012-12-25 MED ORDER — LACTATED RINGERS IV SOLN
INTRAVENOUS | Status: DC
  Filled 2012-12-25: qty 1000

## 2012-12-25 MED ORDER — HYOSCYAMINE SULFATE 0.125 MG PO TABS: 0.1250 mg | ORAL_TABLET | ORAL | Status: DC | PRN

## 2012-12-25 MED ORDER — KETOROLAC TROMETHAMINE 30 MG/ML IJ SOLN
15.0000 mg | Freq: Once | INTRAMUSCULAR | Status: DC | PRN
  Filled 2012-12-25: qty 1

## 2012-12-25 MED ORDER — OXYBUTYNIN CHLORIDE 5 MG PO TABS: 5.0000 mg | ORAL_TABLET | Freq: Four times a day (QID) | ORAL | Status: DC | PRN

## 2012-12-25 MED ORDER — FENTANYL CITRATE 0.05 MG/ML IJ SOLN
INTRAMUSCULAR | Status: DC | PRN
  Administered 2012-12-25 (×2): 50 ug via INTRAVENOUS

## 2012-12-25 SURGICAL SUPPLY — 37 items
ADAPTER CATH URET PLST 4-6FR (CATHETERS) IMPLANT
BAG DRAIN URO-CYSTO SKYTR STRL (DRAIN) ×3 IMPLANT
BASKET LASER NITINOL 1.9FR (BASKET) IMPLANT
BASKET STNLS GEMINI 4WIRE 3FR (BASKET) IMPLANT
BASKET ZERO TIP NITINOL 2.4FR (BASKET) ×3 IMPLANT
BRUSH URET BIOPSY 3F (UROLOGICAL SUPPLIES) IMPLANT
CANISTER SUCT LVC 12 LTR MEDI- (MISCELLANEOUS) IMPLANT
CATH CLEAR GEL 3F BACKSTOP (CATHETERS) IMPLANT
CATH INTERMIT  6FR 70CM (CATHETERS) IMPLANT
CATH URET 5FR 28IN CONE TIP (BALLOONS)
CATH URET 5FR 28IN OPEN ENDED (CATHETERS) ×3 IMPLANT
CATH URET 5FR 70CM CONE TIP (BALLOONS) IMPLANT
CATH URET DUAL LUMEN 6-10FR 50 (CATHETERS) IMPLANT
CLOTH BEACON ORANGE TIMEOUT ST (SAFETY) ×3 IMPLANT
DRAPE CAMERA CLOSED 9X96 (DRAPES) ×3 IMPLANT
ELECT REM PT RETURN 9FT ADLT (ELECTROSURGICAL)
ELECTRODE REM PT RTRN 9FT ADLT (ELECTROSURGICAL) IMPLANT
GLOVE BIO SURGEON STRL SZ7 (GLOVE) ×3 IMPLANT
GLOVE ECLIPSE 7.0 STRL STRAW (GLOVE) ×6 IMPLANT
GLOVE INDICATOR 7.5 STRL GRN (GLOVE) ×6 IMPLANT
GOWN PREVENTION PLUS LG XLONG (DISPOSABLE) ×6 IMPLANT
GUIDEWIRE 0.038 PTFE COATED (WIRE) IMPLANT
GUIDEWIRE ANG ZIPWIRE 038X150 (WIRE) IMPLANT
GUIDEWIRE STR DUAL SENSOR (WIRE) ×6 IMPLANT
IV NS IRRIG 3000ML ARTHROMATIC (IV SOLUTION) ×6 IMPLANT
KIT BALLIN UROMAX 15FX10 (LABEL) IMPLANT
KIT BALLN UROMAX 15FX4 (MISCELLANEOUS) IMPLANT
KIT BALLN UROMAX 26 75X4 (MISCELLANEOUS)
LASER FIBER DISP (UROLOGICAL SUPPLIES) IMPLANT
PACK CYSTOSCOPY (CUSTOM PROCEDURE TRAY) ×3 IMPLANT
SET HIGH PRES BAL DIL (LABEL)
SHEATH ACCESS URETERAL 38CM (SHEATH) ×3 IMPLANT
SHEATH ACCESS URETERAL 54CM (SHEATH) IMPLANT
SHEATH URET ACCESS 12FR/35CM (UROLOGICAL SUPPLIES) IMPLANT
SHEATH URET ACCESS 12FR/55CM (UROLOGICAL SUPPLIES) IMPLANT
STENT URET 6FRX24 CONTOUR (STENTS) ×3 IMPLANT
SYRINGE IRR TOOMEY STRL 70CC (SYRINGE) IMPLANT

## 2012-12-25 NOTE — Transfer of Care (Signed)
Immediate Anesthesia Transfer of Care Note  Patient: Victoria Mosley  Procedure(s) Performed: Procedure(s) with comments: CYSTOSCOPY WITH RETROGRADE PYELOGRAM, URETEROSCOPY, EXTRACTION OF  LEFT STONE WITH BASKETAND STENT PLACEMENT (Left) - 2.5 HRS    Patient Location: PACU  Anesthesia Type:General  Level of Consciousness: awake, alert  and oriented  Airway & Oxygen Therapy: Patient Spontanous Breathing and Patient connected to nasal cannula oxygen  Post-op Assessment: Report given to PACU RN  Post vital signs: Reviewed and stable  Complications: No apparent anesthesia complications

## 2012-12-25 NOTE — H&P (Signed)
Urology History and Physical Exam  CC: Left nephrolithiasis  HPI: 62 year old female presents for left nephrolithiasis. This is a chronic issue. She has had recurrent UTIs. It is unclear whether these have been associated with her stones or not. CT October 2013 revealed a left lower pole stone which was 15 x 10mm, left middle pole stone 7mm, and left upper pole stone of 3 mm. 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 This has been resolved with alfuzosin. We have discussed treating these stones to see if this could be the source of her urinary tract infections. She has a very large stone burden and we discussed management options including percutaneous nephrostolithotomy, ureteroscopy, shockwave lithotripsy. I have explained F. Ross the lithotomy would be the ideal treatment due to the large stone burden in her kidney, but she is very hesitant to undergo the surgery secondary to the risks. She would much rather prefer a retrograde ureteroscopy. We have discussed the risks, benefits, alternatives, and likelihood of achieving goals. I have explained to her that it is unlikely that one surgical procedure would be able to remove or relieve her of her stone burden. It is more realistic that it may take closer to 3 procedures. Urine culture from 12/04/12 showed pan sensitive Escherichia coli. This has been treated with nitrofurantoin.  We discussed that if we are unable to access her ureter today, then she would like to proceed with PCNL in a separate setting.  PMH: Past Medical History  Diagnosis Date  . Menopause     sees Dr. Audie Box  . 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 Migraines   . GERD (gastroesophageal reflux disease)   . IBS (irritable bowel syndrome)   . Arthritis   . Hyperlipidemia   . Diverticulitis   . UTI (urinary tract infection)     started atb on 03-24-12  . Bilateral knee pain      sees Dr. Lequita Halt   . Kidney stones     bilateral  . Nephrolithiasis     left  . Normal cardiac stress test 2012  . History of renal calculi   . Bilateral renal cysts   . Neurogenic bladder   . History of melanoma excision   . Urinary urgency     PSH: Past Surgical History  Procedure Laterality Date  . Melanoma excision  2007    left upper arm per dr. Lonni Fix  . Foot surgery  1987    per Dr. Chaney Malling  . Nasal septum surgery  1992    per Dr. Berna Bue  . Lithotripsy  2007  . Lumbar laminectomy  1993    1993, at L5 per Dr. Roxan Hockey  . Colonoscopy  03-28-12    per Dr. Marina Goodell, polyp,  repeat in 5 yrs  . Cesarean section    . Tubal ligation    . Kidney stone surgery  2005    per Dr. Wanda Plump  . Total hip arthroplasty  2010    on right per Dr. Lequita Halt  . Posterior cervical laminectomy  07/08/2012    Procedure: POSTERIOR CERVICAL LAMINECTOMY;  Surgeon: Barnett Abu, MD;  Location: [facility];  Service: Neurosurgery;  Laterality: N/A;  Cervical Seven- thoracic One Laminectomy and Decompression of Epidural Hematoma  . Hematoma evacuation  07/08/2012    Procedure: EVACUATION HEMATOMA;  Surgeon: Barnett Abu, MD;  Location: [facility];  Service:  Neurosurgery;  Laterality: N/A;  Cervical Seven-thoracic One Laminectomy and Decompression of Epidural Hematoma    Allergies: Allergies  Allergen Reactions  . Pravastatin Sodium (Pravachol)     Joint pain(severe)  . Biaxin (Clarithromycin) Nausea And Vomiting  . Ciprofloxacin Nausea Only  . Clarithromycin Nausea Only    Medications: Prescriptions prior to admission  Medication Sig Dispense Refill  . acetaminophen (TYLENOL) 325 MG tablet Take 1-2 tablets (325-650 mg total) by mouth every 4 (four) hours as needed for pain.      Marland Kitchen alfuzosin (UROXATRAL) 10 MG 24 hr tablet Take 1 tablet by mouth Daily.      Marland Kitchen aspirin 81 MG tablet Take 81 mg by mouth daily.       Marland Kitchen aspirin-acetaminophen-caffeine (EXCEDRIN MIGRAINE) 250-250-65 MG per tablet  Take 1 tablet by mouth every 6 (six) hours as needed. For migraine      . B Complex Vitamins (VITAMIN B COMPLEX PO) Take 1 each by mouth daily.        . butalbital-acetaminophen-caffeine (FIORICET, ESGIC) 50-325-40 MG per tablet Take 1 tablet by mouth 2 (two) times daily as needed. For migraine  60 tablet  5  . Calcium-Vitamin D 600-200 MG-UNIT per tablet Take 1 tablet by mouth 2 (two) times daily.       . diazepam (VALIUM) 2 MG tablet Take 1 tablet (2 mg total) by mouth at bedtime.  15 tablet  0  . diphenhydrAMINE (BENADRYL) 25 mg capsule Take 25 mg by mouth at bedtime.        Marland Kitchen estropipate (OGEN) 0.75 MG tablet Take 1 tablet (0.75 mg total) by mouth daily.  90 tablet  4  . fish oil-omega-3 fatty acids 1000 MG capsule Take 2 g by mouth daily.      . Flax OIL Take 1 each by mouth 2 (two) times daily.       Marland Kitchen loratadine (CLARITIN) 10 MG tablet Take 10 mg by mouth daily. For allergies       . medroxyPROGESTERone (PROVERA) 2.5 MG tablet Take 1 tablet (2.5 mg total) by mouth daily.  90 tablet  4  . Multiple Vitamins-Minerals (ONE-A-DAY WOMENS 50 PLUS PO) Take 1 each by mouth daily.        . Naproxen Sodium (ALEVE) 220 MG CAPS Take 1 capsule by mouth daily.      Marland Kitchen omeprazole (PRILOSEC) 20 MG capsule Take 20 mg by mouth daily.        . polyethylene glycol (MIRALAX / GLYCOLAX) packet Take 17 g by mouth daily as needed.      . Potassium Gluconate 595 MG CAPS Take 1 each by mouth daily.        . Probiotic Product (ALIGN PO) Take by mouth.      . propranolol (INDERAL) 40 MG tablet Take 1 tablet (40 mg total) by mouth daily.  90 tablet  3  . vitamin C (ASCORBIC ACID) 500 MG tablet Take 1,000 mg by mouth daily.          Social History: History   Social History  . Marital Status: Married    Spouse Name: N/A    Number of Children: N/A  . Years of Education: N/A   Occupational History  . Not on file.   Social History Main Topics  . Smoking status: Former Smoker -- 0.25 packs/day    Quit date:  12/20/1998  . Smokeless tobacco: Never Used  . Alcohol Use: No  . Drug Use: No  . Sexually  Active: Yes -- Female partner(s)    Birth Control/ Protection: Post-menopausal   Other Topics Concern  . Not on file   Social History Narrative  . No narrative on file    Family History: Family History  Problem Relation Age of Onset  . Heart disease Mother   . Hypertension Mother   . Diabetes Mother   . Depression Father   . Hypertension Father   . Leukemia Father   . Hypertension Sister   . Heart disease Sister   . Cancer Sister     bladder  . Stomach cancer Paternal Grandmother   . Colon cancer Neg Hx   . Esophageal cancer Neg Hx   . Rectal cancer Neg Hx     Review of Systems: Positive: None Negative: Chest pain, fever, or SOB.  A further 10 point review of systems was negative except what is listed in the HPI.  Physical Exam: Filed Vitals:   12/25/12 0722  BP: 110/72  Pulse: 72  Temp: 98.3 F (36.8 C)  Resp: 18    General: No acute distress.  Awake. Head:  Normocephalic.  Atraumatic. ENT:  EOMI.  Mucous membranes moist Neck:  Supple.  No lymphadenopathy. CV:  S1 present. S2 present. Regular rate. Pulmonary: Equal effort bilaterally.  Clear to auscultation bilaterally. Abdomen: Soft.  Non- tender to palpation. Skin:  Normal turgor.  No visible rash. Extremity: No gross deformity of bilateral upper extremities.  No gross deformity of    bilateral lower extremities. Neurologic: Alert. Appropriate mood.    Studies:  Recent Labs     12/25/12  0739  HGB  13.9    Recent Labs     12/25/12  0739  NA  144  K  3.9  CL  108  BUN  15  CREATININE  0.70     No results found for this basename: PT, INR, APTT,  in the last 72 hours   No components found with this basename: ABG,     Assessment:  Left nephrolithiasis  Plan: To OR for cystoscopy, left ureteroscopy, left retrograde pyelogram, laser lithotripsy, left ureter stent placement.

## 2012-12-25 NOTE — Anesthesia Postprocedure Evaluation (Signed)
  Anesthesia Post-op Note  Patient: Victoria Mosley  Procedure(s) Performed: Procedure(s) (LRB): CYSTOSCOPY WITH RETROGRADE PYELOGRAM, URETEROSCOPY, EXTRACTION OF  LEFT STONE WITH BASKETAND STENT PLACEMENT (Left)  Patient Location: PACU  Anesthesia Type: General  Level of Consciousness: awake and alert   Airway and Oxygen Therapy: Patient Spontanous Breathing  Post-op Pain: mild  Post-op Assessment: Post-op Vital signs reviewed, Patient's Cardiovascular Status Stable, Respiratory Function Stable, Patent Airway and No signs of Nausea or vomiting  Last Vitals:  Filed Vitals:   12/25/12 0722  BP: 110/72  Pulse: 72  Temp: 36.8 C  Resp: 18    Post-op Vital Signs: stable   Complications: No apparent anesthesia complications

## 2012-12-25 NOTE — Anesthesia Procedure Notes (Signed)
Procedure Name: LMA Insertion Date/Time: 12/25/2012 8:40 AM Performed by: Maris Berger T Pre-anesthesia Checklist: Patient identified, Emergency Drugs available, Suction available and Patient being monitored Patient Re-evaluated:Patient Re-evaluated prior to inductionOxygen Delivery Method: Circle System Utilized Preoxygenation: Pre-oxygenation with 100% oxygen Intubation Type: IV induction Ventilation: Mask ventilation without difficulty LMA: LMA inserted LMA Size: 4.0 Number of attempts: 1 Airway Equipment and Method: bite block Placement Confirmation: positive ETCO2 Dental Injury: Teeth and Oropharynx as per pre-operative assessment

## 2012-12-25 NOTE — Preoperative (Signed)
Beta Blockers   Reason not to administer Beta Blockers:Not Applicable 

## 2012-12-25 NOTE — Anesthesia Preprocedure Evaluation (Signed)
Anesthesia Evaluation  Patient identified by MRN, date of birth, ID band Patient awake    Reviewed: Allergy & Precautions, H&P , NPO status , Patient's Chart, lab work & pertinent test results  Airway Mallampati: II TM Distance: >3 FB Neck ROM: Full    Dental no notable dental hx.    Pulmonary neg pulmonary ROS,  breath sounds clear to auscultation  Pulmonary exam normal       Cardiovascular negative cardio ROS  Rhythm:Regular Rate:Normal     Neuro/Psych negative neurological ROS  negative psych ROS   GI/Hepatic Neg liver ROS, GERD-  Medicated,  Endo/Other  negative endocrine ROS  Renal/GU negative Renal ROS  negative genitourinary   Musculoskeletal negative musculoskeletal ROS (+)   Abdominal   Peds negative pediatric ROS (+)  Hematology negative hematology ROS (+)   Anesthesia Other Findings   Reproductive/Obstetrics negative OB ROS                           Anesthesia Physical Anesthesia Plan  ASA: II  Anesthesia Plan: General   Post-op Pain Management:    Induction: Intravenous  Airway Management Planned: LMA  Additional Equipment:   Intra-op Plan:   Post-operative Plan:   Informed Consent: I have reviewed the patients History and Physical, chart, labs and discussed the procedure including the risks, benefits and alternatives for the proposed anesthesia with the patient or authorized representative who has indicated his/her understanding and acceptance.   Dental advisory given  Plan Discussed with: CRNA and Surgeon  Anesthesia Plan Comments:         Anesthesia Quick Evaluation  

## 2012-12-25 NOTE — Op Note (Signed)
Urology Operative Report  Date of Procedure: 12/25/12  Surgeon: Natalia Leatherwood, MD Assistant: None  Preoperative Diagnosis: Left nephrolithiasis Postoperative Diagnosis:  Same  Procedure(s): Cystoscopy Left ureteroscopy with basket stone retrieval  Left retrograde pyelogram Left ureter stent placement  Estimated blood loss: Minimal  Specimen: Stones sent to [facility] lab.  Drains: None  Complications: None  Findings: Multiple small renal stones as well as multiple Randall's plaques.  History of present illness: 62 year old female with a neurogenic bladder presents today with left nephrolithiasis. She is a large stone burden. She is recurrent urinary tract infections. She presents today for ureteroscopy for the left-sided nephrolithiasis.   Procedure in detail: After informed consent was obtained, the patient was taken to the operating room. They were placed in the supine position. SCDs were turned on and in place. IV antibiotics were infused, and general anesthesia was induced. A timeout was performed in which the correct patient, surgical site, and procedure were identified and agreed upon by the team.  The patient was placed in a dorsolithotomy position, making sure to pad all pertinent neurovascular pressure points. A belladonna and opium suppository was placed into her rectum. The genitals were prepped and draped in the usual sterile fashion.  A rigid cystoscope was advanced into the bladder and the bladder was drained. The bladder was then fully distended and evaluated in a systematic fashion; there were no lesions noted throughout the bladder. Attention was turned to the left ureter which was cannulated with a 5 French ureter catheter. I injected contrast to obtain a retrograde pyelogram. There were no filling defects in the ureter or the collecting system. There were no strictures noted throughout the ureter. 2 sensor tip wires were then placed into the left ureter orifice and up  into the left renal pelvis on fluoroscopy. One wire was secured as a safety wire and the other was used as a working wire.  Under fluoroscopy I placed the obturator to a 12/14 French ureter access sheath. This went up with ease. I then placed the obturator and sheath up over the wire under fluoroscopy with ease. The obturator and wire were then removed. A flexible digital ureter scope was then passed up the sheath and into the renal pelvis. The renal pelvis was evaluated in a systematic fashion all calyces were evaluated. There were noted to be multiple small stones throughout the kidney as well as multiple Randall's plaques. 0 tip Nitinol basket was used to remove all the stones which were large enough to be grasped. The large stone which was noted in her lower pole kidney was actually a large amount of smaller stones. The stones were actually break as they were grasped with the Nitinol basket. No lithotripsy was required. As mentioned, some of the fragments were too small to grasp. After all large fragments had been removed the ureteroscope and access sheath were removed slowly so that the entirety of the ureter was visualized. There was no disruption of the mucosa of the ureter. The safety wire was then loaded back through the cystoscope and a 6 x 24 double-J ureter stent was placed up the wire under direct and fluoroscopic view with ease. A good curl was noted in the left renal pelvis and a curl was noted on direct visualization in the bladder. The string was left in place on the stent and secured to the patient's pubis. The bladder was drained and I placed 10 cc of lidocaine jelly into the urethra. This completed the procedure.  Was placed  back in a supine position, anesthesia was reversed, and she was taken to the [facility] in stable condition.  She will remove the stent on her own early on this coming Monday morning. I will cover her with an antibiotic during the duration given that she has recurrent urinary  tract infections.

## 2012-12-26 ENCOUNTER — Encounter (HOSPITAL_BASED_OUTPATIENT_CLINIC_OR_DEPARTMENT_OTHER): Payer: Self-pay | Admitting: Urology

## 2013-01-27 ENCOUNTER — Encounter: Payer: Self-pay | Admitting: Gynecology

## 2013-01-27 ENCOUNTER — Ambulatory Visit (INDEPENDENT_AMBULATORY_CARE_PROVIDER_SITE_OTHER): Payer: 59 | Admitting: Gynecology

## 2013-01-27 VITALS — BP 124/74 | Ht 67.0 in | Wt 184.0 lb

## 2013-01-27 DIAGNOSIS — K649 Unspecified hemorrhoids: Secondary | ICD-10-CM

## 2013-01-27 DIAGNOSIS — Z01419 Encounter for gynecological examination (general) (routine) without abnormal findings: Secondary | ICD-10-CM

## 2013-01-27 DIAGNOSIS — Z79890 Hormone replacement therapy (postmenopausal): Secondary | ICD-10-CM

## 2013-01-27 MED ORDER — MEDROXYPROGESTERONE ACETATE 2.5 MG PO TABS: 2.5000 mg | ORAL_TABLET | Freq: Every day | ORAL | Status: DC

## 2013-01-27 MED ORDER — ESTROPIPATE 0.75 MG PO TABS: 0.7500 mg | ORAL_TABLET | Freq: Every day | ORAL | Status: DC

## 2013-01-27 NOTE — Progress Notes (Signed)
Victoria Mosley October 10, 1951 [id number]        62 y.o.  G1P1001 for annual exam.  Several issues noted below.  Past medical history,surgical history, medications, allergies, family history and social history were all reviewed and documented in the EPIC chart. ROS:  Was performed and pertinent positives and negatives are included in the history.  Exam: Kim assistant Filed Vitals:   01/27/13 1435  BP: 124/74  Height: 5\' 7"  (1.702 m)  Weight: 184 lb (83.462 kg)   General appearance  Normal Skin grossly normal Head/Neck normal with no cervical or supraclavicular adenopathy thyroid normal Lungs  clear Cardiac RR, without RMG Abdominal  soft, nontender, without masses, organomegaly or hernia Breasts  examined lying and sitting without masses, retractions, discharge or axillary adenopathy. Pelvic  Ext/BUS/vagina  normal with mild atrophic changes  Cervix  normal with mild atrophic changes  Uterus  anteverted, normal size, shape and contour, midline and mobile nontender   Adnexa  Without masses or tenderness    Anus and perineum  normal   Rectovaginal  normal sphincter tone without palpated masses or tenderness. Old external hemorrhoids.   Assessment/Plan:  62 y.o. G45P1001 female for annual exam.   1. HRT. Patient is on Ogen 0.75 mg and Provera 2.5 mg daily. Doing well with no bleeding. Patient wants to continue. I reviewed ACOG in [facility] statements for lowest dose the shortest period of time and recommendations to wean at intervals. WHI study with risks of stroke heart attack DVT breast cancer also again reviewed with her. Various options to include transdermal with first pass effect advantage rediscussed. After lengthy discussion patient wants to continue as is and I refilled her HRT x1 year. Patient knows to report any vaginal bleeding. 2. Mammography 10/2012. Is being followed at six-month interval for suspected benign changes. Patient is comfortable with this and will followup in 6 months  as prescribed. SBE monthly reviewed. 3. Pap smear 2012. No Pap smear done today. No history of significant abnormal Pap smears. Plan repeat next year a 3 year interval. 4. DEXA 09/2011 normal. Plan repeat at 5 year interval. Increase calcium vitamin D reviewed. 5. Colonoscopy 02/2012. Repeat at their recommended interval. 6. History of renal lithiasis. Apparently at urology CT scan they saw some changes suggestive of ovarian cyst did not suggest any particular followup. We'll try to get copy of report. If unable or any question then we'll proceed with ultrasound for surveillance. 7. Hemorrhoids. Not overly bothersome. We'll plan expectant management. General surgical referral if bothersome. 8. Health maintenance. No blood work done as this is all done through her primary physician's office. Followup one year, sooner as needed.   Dara Lords MD, 3:41 PM 01/27/2013

## 2013-01-27 NOTE — Patient Instructions (Addendum)
We will contact you after reviewing the CT scan in reference to questionable ovarian cysts. Call us if you do not hear within one to 2 weeks. Followup in one year, sooner as needed. Report any vaginal bleeding.

## 2013-01-28 ENCOUNTER — Telehealth: Payer: Self-pay | Admitting: *Deleted

## 2013-01-28 NOTE — Telephone Encounter (Signed)
Message copied by Aura Camps on Tue Jan 28, 2013 10:39 AM ------      Message from: Dara Lords      Created: Mon Jan 27, 2013  3:46 PM       I needs copy of CT report from her urologist's office. Apparently there is a question about seeing ovarian cyst and I would like to see what the report shows. ------

## 2013-01-28 NOTE — Telephone Encounter (Signed)
I called [facility] and was unable to receive the report, pt must fill out a medical release form stating the [facility] can have records. I spoke with patient and she will call and have this done.

## 2013-02-03 ENCOUNTER — Telehealth: Payer: Self-pay | Admitting: *Deleted

## 2013-02-03 DIAGNOSIS — N83209 Unspecified ovarian cyst, unspecified side: Secondary | ICD-10-CM

## 2013-02-03 NOTE — Telephone Encounter (Signed)
Left message for pt to call.

## 2013-02-03 NOTE — Telephone Encounter (Signed)
Pt informed with the below note, order placed. 

## 2013-02-03 NOTE — Telephone Encounter (Signed)
Message copied by Aura Camps on Mon Feb 03, 2013  8:46 AM ------      Message from: Dara Lords      Created: Fri Jan 31, 2013  3:03 PM       Tell patient I reviewed her CT scan it did not come with report but it looks like she had a 3 cm cyst on her ovary. I think for completeness we should do a pelvic ultrasound just to make sure everything looks okay. I recommend she schedule a GYN ultrasound. ------

## 2013-02-26 ENCOUNTER — Ambulatory Visit (INDEPENDENT_AMBULATORY_CARE_PROVIDER_SITE_OTHER): Payer: 59 | Admitting: Gynecology

## 2013-02-26 ENCOUNTER — Other Ambulatory Visit: Payer: Self-pay | Admitting: Gynecology

## 2013-02-26 ENCOUNTER — Encounter: Payer: Self-pay | Admitting: Gynecology

## 2013-02-26 ENCOUNTER — Ambulatory Visit (INDEPENDENT_AMBULATORY_CARE_PROVIDER_SITE_OTHER): Payer: 59

## 2013-02-26 DIAGNOSIS — N83209 Unspecified ovarian cyst, unspecified side: Secondary | ICD-10-CM

## 2013-02-26 DIAGNOSIS — N83339 Acquired atrophy of ovary and fallopian tube, unspecified side: Secondary | ICD-10-CM

## 2013-02-26 DIAGNOSIS — D259 Leiomyoma of uterus, unspecified: Secondary | ICD-10-CM

## 2013-02-26 DIAGNOSIS — D251 Intramural leiomyoma of uterus: Secondary | ICD-10-CM

## 2013-02-26 DIAGNOSIS — N854 Malposition of uterus: Secondary | ICD-10-CM

## 2013-02-26 NOTE — Patient Instructions (Signed)
Follow up routinely

## 2013-02-26 NOTE — Progress Notes (Addendum)
Patient presents having had a CT scan for renal lithiasis where they reportedly told her they saw a ovarian cyst but not to worry about it. They sent a copy of the CT done review there is no report and I cannot interpret whether there is any pathology in the pelvis. We asked her to come in for an ultrasound now to clear her ovaries given his history.  Ultrasound shows normal uterine size retroverted with several myomas the largest measuring 30 mm. Endometrial echo not well visualized. Right and left ovaries small and atrophic in appearance. No apparent cysts or masses in either adnexa.  Assessment and plan: Normal pelvic ultrasound. Will get copy of the written report to see what they describe but given the normal ultrasound no followup needed. Patient agrees with the plan.  Addendum: Received CT scan report in actuality they describe a 3.1 cm fibroid and not an ovarian cyst. This correlates with her ultrasound. No further workup needed.

## 2013-03-03 ENCOUNTER — Ambulatory Visit (INDEPENDENT_AMBULATORY_CARE_PROVIDER_SITE_OTHER): Payer: 59 | Admitting: Physician Assistant

## 2013-03-03 ENCOUNTER — Other Ambulatory Visit (INDEPENDENT_AMBULATORY_CARE_PROVIDER_SITE_OTHER): Payer: 59

## 2013-03-03 ENCOUNTER — Encounter: Payer: Self-pay | Admitting: Physician Assistant

## 2013-03-03 ENCOUNTER — Telehealth: Payer: Self-pay | Admitting: Internal Medicine

## 2013-03-03 VITALS — BP 100/62 | HR 76 | Ht 67.0 in | Wt 180.0 lb

## 2013-03-03 DIAGNOSIS — K648 Other hemorrhoids: Secondary | ICD-10-CM

## 2013-03-03 DIAGNOSIS — K5900 Constipation, unspecified: Secondary | ICD-10-CM

## 2013-03-03 DIAGNOSIS — K625 Hemorrhage of anus and rectum: Secondary | ICD-10-CM

## 2013-03-03 DIAGNOSIS — K589 Irritable bowel syndrome without diarrhea: Secondary | ICD-10-CM

## 2013-03-03 LAB — CBC WITH DIFFERENTIAL/PLATELET
Basophils Absolute: 0 10*3/uL (ref 0.0–0.1)
Basophils Relative: 0.4 % (ref 0.0–3.0)
Eosinophils Absolute: 0.1 10*3/uL (ref 0.0–0.7)
Eosinophils Relative: 2 % (ref 0.0–5.0)
HCT: 41.6 % (ref 36.0–46.0)
Hemoglobin: 14.3 g/dL (ref 12.0–15.0)
Lymphocytes Relative: 36.8 % (ref 12.0–46.0)
Lymphs Abs: 2 10*3/uL (ref 0.7–4.0)
MCHC: 34.3 g/dL (ref 30.0–36.0)
MCV: 88.6 fl (ref 78.0–100.0)
Monocytes Absolute: 0.6 10*3/uL (ref 0.1–1.0)
Monocytes Relative: 11.9 % (ref 3.0–12.0)
Neutro Abs: 2.6 10*3/uL (ref 1.4–7.7)
Neutrophils Relative %: 48.9 % (ref 43.0–77.0)
Platelets: 200 10*3/uL (ref 150.0–400.0)
RBC: 4.69 Mil/uL (ref 3.87–5.11)
RDW: 13.2 % (ref 11.5–14.6)
WBC: 5.3 10*3/uL (ref 4.5–10.5)

## 2013-03-03 MED ORDER — HYDROCORTISONE ACETATE 25 MG RE SUPP: RECTAL | Status: DC

## 2013-03-03 MED ORDER — HYOSCYAMINE SULFATE 0.125 MG SL SUBL: SUBLINGUAL_TABLET | SUBLINGUAL | Status: DC

## 2013-03-03 NOTE — Telephone Encounter (Signed)
Patient states she was out of town over the weekend for her son's wedding. She was constipated. She did not take anything for this. Yesterday, she started having diarrhea and is now having diarrhea with bright, red blood. States the diarrhea is accompanied by painful abdominal cramping. She is out of work today due to the diarrhea and pain. States she has had a change in bowel movements for the last month. Hx IBS in past but has not had any problems for a long time. She takes [employer]ibr[employer], academic daily. Wants to be seen ASAP. Offered OV tomorrow but she feels she needs to be seen today. Scheduled with Amy Esterwood, PA at 11:00 AM.

## 2013-03-03 NOTE — Patient Instructions (Addendum)
Please go to the basement level to have your labs drawn.  We sent prescriptions to [facility] for Anusol [facility] Suppositories and Levsin antispasmodic for cramping and spasms.   Continue the Align, 1 daily.  We have given you samples.  Make an appointment to see Dr Marina Goodell or Amy Esterwood PA-C in 2-3 weeks if the above has not helped.

## 2013-03-03 NOTE — Progress Notes (Signed)
Subjective:    Patient ID: Victoria Mosley, female    DOB: Jan 07, 1951, 62 y.o.   MRN: [id number]  HPI  Victoria Mosley is a pleasant 62 year old white female known to Victoria Mosley from prior colonoscopy done in May of 2013 area that she has history of adenomatous colon polyps. She was found to have moderate diverticulosis throughout the colon, internal hemorrhoids and a diminutive polyp was removed with no tissue obtained. Patient relates history of IBS, nephrolithiasis, neurogenic bladder area she had a traumatic epidural hematoma last fall and required an emergency cervical laminectomy. This was complicated by bowel and bladder incontinence. She says ever since her surgery her: colon Has not gone back to normal and she has had increased problems with constipation. She reports constipation on a daily basis, however thus far has not had success with laxatives because they tend to give her diarrhea. Currently she says she will be constipated for several days and then have an episode of fairly severe abdominal cramping, spasm passage of a large amount of hard stool followed by diarrhea and infrequently rectal bleeding She says she's having these episodes with increasing frequency over the past couple of months and that they are occurring at least once every 2 weeks if not more frequently . She had an episode yesterday and says she had about 8 episodes of diarrhea all of which had some bright red blood in. She does not have any associated rectal pain or pressure. For bleeding and diarrhea have resolved as of this morning and she says she feels sore. She did not have any nausea or vomiting but did feel somewhat clammy yesterday with onset. She is worried about the bleeding.    Review of Systems  Constitutional: Positive for appetite change.  HENT: Negative.   Eyes: Negative.   Respiratory: Negative.   Cardiovascular: Negative.   Gastrointestinal: Positive for abdominal pain, diarrhea, constipation and blood in  stool.  Genitourinary: Negative.   Musculoskeletal: Negative.   Skin: Negative.   Allergic/Immunologic: Negative.   Neurological: Negative.   Hematological: Negative.   Psychiatric/Behavioral: Negative.    Outpatient Prescriptions Prior to Visit  Medication Sig Dispense Refill  . alfuzosin (UROXATRAL) 10 MG 24 hr tablet Take 1 tablet by mouth Daily.      . B Complex Vitamins (VITAMIN B COMPLEX PO) Take 1 each by mouth daily.        . butalbital-acetaminophen-caffeine (FIORICET, ESGIC) 50-325-40 MG per tablet Take 1 tablet by mouth 2 (two) times daily as needed. For migraine  60 tablet  5  . Calcium-Vitamin D 600-200 MG-UNIT per tablet Take 1 tablet by mouth daily.       . diphenhydrAMINE (BENADRYL) 25 mg capsule Take 25 mg by mouth at bedtime.        Marland Kitchen estropipate (OGEN) 0.75 MG tablet Take 1 tablet (0.75 mg total) by mouth daily.  90 tablet  4  . fish oil-omega-3 fatty acids 1000 MG capsule Take 2 g by mouth daily.      . Flax OIL Take 1 each by mouth 2 (two) times daily.       Marland Kitchen loratadine (CLARITIN) 10 MG tablet Take 10 mg by mouth daily. For allergies       . medroxyPROGESTERone (PROVERA) 2.5 MG tablet Take 1 tablet (2.5 mg total) by mouth daily.  90 tablet  4  . Multiple Vitamins-Minerals (ONE-A-DAY WOMENS 50 PLUS PO) Take 1 each by mouth daily.        . Naproxen Sodium (ALEVE)  220 MG CAPS Take 2 capsules by mouth every morning and 1 capsule by mouth every evening.      Marland Kitchen omeprazole (PRILOSEC) 20 MG capsule Take 20 mg by mouth daily.        . polyethylene glycol (MIRALAX / GLYCOLAX) packet Take 17 g by mouth daily as needed.      . Probiotic Product (ALIGN PO) Take 1 capsule by mouth daily.       . propranolol (INDERAL) 40 MG tablet Take 1 tablet (40 mg total) by mouth daily.  90 tablet  3  . vitamin C (ASCORBIC ACID) 500 MG tablet Take 1,000 mg by mouth daily.       . diazepam (VALIUM) 2 MG tablet Take 1 tablet (2 mg total) by mouth at bedtime.  15 tablet  0   No  facility-administered medications prior to visit.   Allergies  Allergen Reactions  . Pravastatin Sodium (Pravachol)     Joint pain(severe)  . Biaxin (Clarithromycin) Nausea And Vomiting  . Ciprofloxacin Nausea Only  . Clarithromycin Nausea Only      Patient Active Problem List   Diagnosis Date Noted  . 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 Migraine with aura, intractable 08/06/2012  . Overactive bladder 07/31/2012  . Traumatic epidural hematoma 07/12/2012  . IBS (irritable bowel syndrome) 07/26/2011  . OSTEOARTHRITIS 01/26/2010  . NEPHROLITHIASIS, HX OF 01/26/2010  . DIVERTICULITIS, COLON 11/02/2008  . HYPERLIPIDEMIA 02/25/2008  . COMMON MIGRAINE 10/17/2007  . ALLERGIC RHINITIS 06/19/2007  . GERD 06/19/2007   History  Substance Use Topics  . Smoking status: Former Smoker -- 0.25 packs/day    Quit date: 12/20/1998  . Smokeless tobacco: Never Used  . Alcohol Use: No    Objective:   Physical Exam well-developed white female in no acute distress, pleasant accompanied by her husband. Blood pressure 100/62 pulse 76 height 5 foot 7 weight 180. HEENT; nontraumatic normocephalic EOMI PERRLA sclera anicteric, Neck ;supple no JVD, Cardiovascular; regular rate and rhythm with S1-S2 no murmur or gallop, Pulmonary; clear bilaterally, Abdomen; soft very minimally tender in the left lower quadrant no guarding or rebound no palpable mass or hepatosplenomegaly, Rectal; exam large external hemorrhoidal tags noninflamed on anoscopy I do not see an inflamed internal hemorrhoid she is nontender to exam scant stool is present which is trace heme positive no gross blood noted, Extremities; no clubbing cyanosis or edema skin warm and dry, Psych; mood and affect normal and appropriate        Assessment & Plan:  #53 62 year old female with what sounds like IBS constipation predominant complicated by episodes of cramping and diarrhea with hematochezia most likely  secondary to internal hemorrhoids . Another possibility would be a segmental colitis/diverticulitis type picture though no inflammatory changes were noted at the time of her colonoscopy a year ago. #2 nephrolithiasis #3 anxiety #4 diverticulosis #5 status post emergency cervical laminectomy September 2013 with chronic problems with bowel since. #6 history of adenomatous colon polyps last colonoscopy May 2013 due for followup in 5 years  Plan; will check CBC today Trial of Levsin sublingual one on tongue of onset of cramping and spasm and then every 4 hours as needed for these episodes. Start MiraLax one half dose daily-we discussed altering his regimen as needed Align one by mouth daily Anusol-HC suppositories 1 per rectum each bedtime after an episode of bleeding x4-5 days then as needed Followup with Victoria Mosley or myself as needed, patient is advised if these  measures are not helpful to call back for followup appointment in a few weeks

## 2013-03-05 NOTE — Progress Notes (Signed)
Agree with initial assessment and plans as outlined 

## 2013-03-25 ENCOUNTER — Ambulatory Visit: Payer: 59 | Admitting: Physical Medicine & Rehabilitation

## 2013-05-16 ENCOUNTER — Encounter: Payer: Self-pay | Admitting: Gynecology

## 2013-06-10 ENCOUNTER — Other Ambulatory Visit: Payer: Self-pay | Admitting: Family Medicine

## 2013-06-16 ENCOUNTER — Other Ambulatory Visit: Payer: Self-pay | Admitting: Family Medicine

## 2013-06-23 ENCOUNTER — Other Ambulatory Visit: Payer: Self-pay | Admitting: Family Medicine

## 2013-06-24 NOTE — Telephone Encounter (Signed)
Call in #[phone] with 5 rf 

## 2013-06-24 NOTE — Telephone Encounter (Signed)
Pt request refill of FIORICET 50-325-40 MG per tablet. This had been attempted to send on 8/18, but transmission had failed. Can you pls resend? [facility] [address]

## 2013-06-26 NOTE — Telephone Encounter (Signed)
Received a fax from the pharmacy to changeFioricet 50-325-40 to Fioricet 50-300-40. Ok to change?

## 2013-07-15 ENCOUNTER — Other Ambulatory Visit (INDEPENDENT_AMBULATORY_CARE_PROVIDER_SITE_OTHER): Payer: 59

## 2013-07-15 DIAGNOSIS — Z0000 Encounter for general adult medical examination without abnormal findings: Secondary | ICD-10-CM

## 2013-07-15 LAB — POCT URINALYSIS DIPSTICK
Bilirubin, UA: NEGATIVE
Blood, UA: NEGATIVE
Glucose, UA: NEGATIVE
Ketones, UA: NEGATIVE
Nitrite, UA: NEGATIVE
Protein, UA: NEGATIVE
Spec Grav, UA: 1.015
Urobilinogen, UA: 0.2
pH, UA: 7.5

## 2013-07-15 LAB — CBC WITH DIFFERENTIAL/PLATELET
Basophils Absolute: 0 10*3/uL (ref 0.0–0.1)
Basophils Relative: 0.3 % (ref 0.0–3.0)
Eosinophils Absolute: 0.1 10*3/uL (ref 0.0–0.7)
Eosinophils Relative: 1.4 % (ref 0.0–5.0)
HCT: 45.2 % (ref 36.0–46.0)
Hemoglobin: 15.3 g/dL — ABNORMAL HIGH (ref 12.0–15.0)
Lymphocytes Relative: 33 % (ref 12.0–46.0)
Lymphs Abs: 2.1 10*3/uL (ref 0.7–4.0)
MCHC: 33.8 g/dL (ref 30.0–36.0)
MCV: 88.8 fl (ref 78.0–100.0)
Monocytes Absolute: 0.8 10*3/uL (ref 0.1–1.0)
Monocytes Relative: 12.3 % — ABNORMAL HIGH (ref 3.0–12.0)
Neutro Abs: 3.4 10*3/uL (ref 1.4–7.7)
Neutrophils Relative %: 53 % (ref 43.0–77.0)
Platelets: 224 10*3/uL (ref 150.0–400.0)
RBC: 5.09 Mil/uL (ref 3.87–5.11)
RDW: 14 % (ref 11.5–14.6)
WBC: 6.4 10*3/uL (ref 4.5–10.5)

## 2013-07-15 LAB — LDL CHOLESTEROL, DIRECT: Direct LDL: 164.2 mg/dL

## 2013-07-15 LAB — BASIC METABOLIC PANEL
BUN: 19 mg/dL (ref 6–23)
CO2: 32 mEq/L (ref 19–32)
Calcium: 9.7 mg/dL (ref 8.4–10.5)
Chloride: 104 mEq/L (ref 96–112)
Creatinine, Ser: 0.8 mg/dL (ref 0.4–1.2)
GFR: 80.95 mL/min (ref >60.00)
Glucose, Bld: 91 mg/dL (ref 70–99)
Potassium: 4.6 mEq/L (ref 3.5–5.1)
Sodium: 143 mEq/L (ref 135–145)

## 2013-07-15 LAB — HEPATIC FUNCTION PANEL
ALT: 27 U/L (ref 0–35)
AST: 27 U/L (ref 0–37)
Albumin: 4.2 g/dL (ref 3.5–5.2)
Alkaline Phosphatase: 65 U/L (ref 39–117)
Bilirubin, Direct: 0 mg/dL (ref 0.0–0.3)
Total Bilirubin: 0.5 mg/dL (ref 0.3–1.2)
Total Protein: 7.3 g/dL (ref 6.0–8.3)

## 2013-07-15 LAB — LIPID PANEL
Cholesterol: 230 mg/dL — ABNORMAL HIGH (ref 0–200)
HDL: 42.2 mg/dL (ref >39.00)
Total CHOL/HDL Ratio: 5
Triglycerides: 147 mg/dL (ref 0.0–149.0)
VLDL: 29.4 mg/dL (ref 0.0–40.0)

## 2013-07-15 LAB — TSH: TSH: 3.26 u[IU]/mL (ref 0.35–5.50)

## 2013-07-16 ENCOUNTER — Other Ambulatory Visit: Payer: 59

## 2013-07-17 NOTE — Progress Notes (Signed)
Quick Note:  Pt has appointment on 07/22/13 will go over then, also released results in my chart. ______

## 2013-07-22 ENCOUNTER — Ambulatory Visit (INDEPENDENT_AMBULATORY_CARE_PROVIDER_SITE_OTHER): Payer: 59 | Admitting: Family Medicine

## 2013-07-22 ENCOUNTER — Encounter: Payer: Self-pay | Admitting: Family Medicine

## 2013-07-22 VITALS — BP 122/74 | HR 78 | Temp 98.9°F | Ht 67.5 in | Wt 186.0 lb

## 2013-07-22 DIAGNOSIS — Z0000 Encounter for general adult medical examination without abnormal findings: Secondary | ICD-10-CM

## 2013-07-22 MED ORDER — BENZOYL PEROXIDE 5 % EX GEL: Freq: Every day | CUTANEOUS | Status: DC

## 2013-07-22 MED ORDER — HYDROCHLOROTHIAZIDE 25 MG PO TABS: 25.0000 mg | ORAL_TABLET | Freq: Every day | ORAL | Status: DC

## 2013-07-22 MED ORDER — HYOSCYAMINE SULFATE 0.125 MG SL SUBL: SUBLINGUAL_TABLET | SUBLINGUAL | Status: DC

## 2013-07-22 MED ORDER — FIORICET 50-325-40 MG PO TABS: ORAL_TABLET | ORAL | Status: DC

## 2013-07-22 MED ORDER — OMEPRAZOLE 20 MG PO CPDR: 20.0000 mg | DELAYED_RELEASE_CAPSULE | Freq: Every day | ORAL | Status: DC

## 2013-07-22 MED ORDER — PREDNISONE 10 MG PO TABS: 10.0000 mg | ORAL_TABLET | Freq: Every day | ORAL | Status: DC

## 2013-07-22 MED ORDER — BETAMETHASONE DIPROPIONATE 0.05 % EX LOTN: TOPICAL_LOTION | Freq: Two times a day (BID) | CUTANEOUS | Status: DC

## 2013-07-22 MED ORDER — PROPRANOLOL HCL 40 MG PO TABS: ORAL_TABLET | ORAL | Status: DC

## 2013-07-22 NOTE — Progress Notes (Signed)
  Subjective:    Patient ID: Victoria Mosley, female    DOB: 08-28-1951, 62 y.o.   MRN: [id number]  HPI 62 yr old female for a cpx. She feels well.    Review of Systems  Constitutional: Negative.   HENT: Negative.   Eyes: Negative.   Respiratory: Negative.   Cardiovascular: Negative.   Gastrointestinal: Negative.   Genitourinary: Negative for dysuria, urgency, frequency, hematuria, flank pain, decreased urine volume, enuresis, difficulty urinating, pelvic pain and dyspareunia.  Musculoskeletal: Negative.   Skin: Negative.   Neurological: Negative.   Psychiatric/Behavioral: Negative.        Objective:   Physical Exam  Constitutional: She is oriented to person, place, and time. She appears well-developed and well-nourished. No distress.  HENT:  Head: Normocephalic and atraumatic.  Right Ear: External ear normal.  Left Ear: External ear normal.  Nose: Nose normal.  Mouth/Throat: Oropharynx is clear and moist. No oropharyngeal exudate.  Eyes: Conjunctivae and EOM are normal. Pupils are equal, round, and reactive to light. No scleral icterus.  Neck: Normal range of motion. Neck supple. No JVD present. No thyromegaly present.  Cardiovascular: Normal rate, regular rhythm, normal heart sounds and intact distal pulses.  Exam reveals no gallop and no friction rub.   No murmur heard. Pulmonary/Chest: Effort normal and breath sounds normal. No respiratory distress. She has no wheezes. She has no rales. She exhibits no tenderness.  Abdominal: Soft. Bowel sounds are normal. She exhibits no distension and no mass. There is no tenderness. There is no rebound and no guarding.  Musculoskeletal: Normal range of motion. She exhibits no edema and no tenderness.  Lymphadenopathy:    She has no cervical adenopathy.  Neurological: She is alert and oriented to person, place, and time. She has normal reflexes. No cranial nerve deficit. She exhibits normal muscle tone. Coordination normal.  Skin: Skin  is warm and dry. No rash noted. No erythema.  Psychiatric: She has a normal mood and affect. Her behavior is normal. Judgment and thought content normal.          Assessment & Plan:  Well exam.

## 2013-07-28 ENCOUNTER — Telehealth: Payer: Self-pay | Admitting: Family Medicine

## 2013-07-28 NOTE — Telephone Encounter (Signed)
Fax from pharmacy stating that pt is supposed to be on 1 % of betamethasone lotion? Can you clarify?

## 2013-07-28 NOTE — Telephone Encounter (Signed)
She is actually on 0.05% betamethasone lotion. Have her call us about the DEXA in late December since insurance wants [place] to wait a full 2 years to repeat it

## 2013-07-28 NOTE — Telephone Encounter (Signed)
Pt last bmd was 10-04-2011. Pt would like to sch another BMD due to early menopause. Can I sch?

## 2013-07-29 MED ORDER — BETAMETHASONE VALERATE 0.1 % EX LOTN: TOPICAL_LOTION | Freq: Two times a day (BID) | CUTANEOUS | Status: DC

## 2013-07-29 NOTE — Telephone Encounter (Signed)
I spoke with pt and she was on the 0.1 % of Valisone lotion, per Dr. Clent Ridges okay to send in script which I did send e-scribe.

## 2013-10-03 ENCOUNTER — Telehealth: Payer: Self-pay | Admitting: Family Medicine

## 2013-10-03 DIAGNOSIS — M8580 Other specified disorders of bone density and structure, unspecified site: Secondary | ICD-10-CM

## 2013-10-03 NOTE — Telephone Encounter (Signed)
Pt would like to schedule bone density test last one was dec 2012. Can I sch?

## 2013-10-03 NOTE — Telephone Encounter (Signed)
I ordered the DEXA so she should get a call next week

## 2013-10-06 NOTE — Telephone Encounter (Signed)
I left message with below information. 

## 2013-11-12 ENCOUNTER — Ambulatory Visit (INDEPENDENT_AMBULATORY_CARE_PROVIDER_SITE_OTHER)
Admission: RE | Admit: 2013-11-12 | Discharge: 2013-11-12 | Disposition: A | Discharge status: Home / Self Care | Payer: 59 | Source: Ambulatory Visit | Attending: Family Medicine | Admitting: Family Medicine

## 2013-11-12 ENCOUNTER — Other Ambulatory Visit: Payer: 59

## 2013-11-12 DIAGNOSIS — M8580 Other specified disorders of bone density and structure, unspecified site: Secondary | ICD-10-CM

## 2013-11-12 DIAGNOSIS — M949 Disorder of cartilage, unspecified: Secondary | ICD-10-CM

## 2013-11-12 DIAGNOSIS — M899 Disorder of bone, unspecified: Secondary | ICD-10-CM

## 2013-11-20 ENCOUNTER — Encounter: Payer: Self-pay | Admitting: Gynecology

## 2013-11-23 ENCOUNTER — Telehealth: Payer: Self-pay | Admitting: Family Medicine

## 2013-11-23 NOTE — Telephone Encounter (Signed)
[facility] requesting new script for FIORICET 50-325-40 MG per tablet #60

## 2013-11-24 MED ORDER — FIORICET 50-325-40 MG PO TABS: ORAL_TABLET | ORAL | Status: DC

## 2013-11-24 NOTE — Telephone Encounter (Signed)
Call in #[phone] with 5 rf 

## 2013-11-24 NOTE — Telephone Encounter (Signed)
[facility] 

## 2013-12-09 ENCOUNTER — Telehealth: Payer: Self-pay | Admitting: Family Medicine

## 2013-12-09 NOTE — Telephone Encounter (Signed)
She has stable osteopenia. Take 1500 mg a day of calcium and 2000 units a day of vitamin D. Repeat DEXA in 2 years

## 2013-12-09 NOTE — Telephone Encounter (Signed)
Pt would like results of bone density test. Pt has test at [facility]

## 2013-12-11 NOTE — Telephone Encounter (Signed)
I spoke with pt  

## 2014-01-22 ENCOUNTER — Telehealth: Payer: Self-pay | Admitting: Family Medicine

## 2014-01-22 NOTE — Telephone Encounter (Signed)
Pt will have a new baby in the family and would like to know if she is current on TDAP? pls advise

## 2014-01-22 NOTE — Telephone Encounter (Signed)
Pt had a TDAP in 2013 and I left message with this information.

## 2014-01-28 DIAGNOSIS — B977 Papillomavirus as the cause of diseases classified elsewhere: Secondary | ICD-10-CM | POA: Insufficient documentation

## 2014-01-29 ENCOUNTER — Encounter: Payer: Self-pay | Admitting: Gynecology

## 2014-01-29 ENCOUNTER — Ambulatory Visit (INDEPENDENT_AMBULATORY_CARE_PROVIDER_SITE_OTHER): Payer: 59 | Admitting: Gynecology

## 2014-01-29 ENCOUNTER — Other Ambulatory Visit (HOSPITAL_COMMUNITY)
Admission: RE | Admit: 2014-01-29 | Discharge: 2014-01-29 | Disposition: A | Discharge status: Home / Self Care | Payer: 59 | Source: Ambulatory Visit | Attending: Gynecology | Admitting: Gynecology

## 2014-01-29 VITALS — BP 120/76 | Ht 67.0 in | Wt 196.0 lb

## 2014-01-29 DIAGNOSIS — Z124 Encounter for screening for malignant neoplasm of cervix: Secondary | ICD-10-CM | POA: Insufficient documentation

## 2014-01-29 DIAGNOSIS — R87810 Cervical high risk human papillomavirus (HPV) DNA test positive: Secondary | ICD-10-CM | POA: Insufficient documentation

## 2014-01-29 DIAGNOSIS — Z1151 Encounter for screening for human papillomavirus (HPV): Secondary | ICD-10-CM | POA: Insufficient documentation

## 2014-01-29 DIAGNOSIS — D251 Intramural leiomyoma of uterus: Secondary | ICD-10-CM

## 2014-01-29 DIAGNOSIS — Z01419 Encounter for gynecological examination (general) (routine) without abnormal findings: Secondary | ICD-10-CM

## 2014-01-29 DIAGNOSIS — Z79890 Hormone replacement therapy (postmenopausal): Secondary | ICD-10-CM

## 2014-01-29 MED ORDER — MEDROXYPROGESTERONE ACETATE 2.5 MG PO TABS: 2.5000 mg | ORAL_TABLET | Freq: Every day | ORAL | Status: DC

## 2014-01-29 MED ORDER — ESTROPIPATE 0.75 MG PO TABS: 0.7500 mg | ORAL_TABLET | Freq: Every day | ORAL | Status: DC

## 2014-01-29 NOTE — Addendum Note (Signed)
Addended by: Nelva Nay on: 01/29/2014 04:27 PM   Modules accepted: Orders

## 2014-01-29 NOTE — Progress Notes (Signed)
Victoria Mosley 21-Oct-1951 [id number]        63 y.o.  G1P1001 for annual exam.  Several issues noted below.  Past medical history,surgical history, problem list, medications, allergies, family history and social history were all reviewed and documented in the EPIC chart.  ROS:  Performed and pertinent positives and negatives are included in the history, assessment and plan .  Exam: Kim assistant Filed Vitals:   01/29/14 1553  BP: 120/76  Height: 5\' 7"  (1.702 m)  Weight: 196 lb (88.905 kg)   General appearance  Normal Skin grossly normal Head/Neck normal with no cervical or supraclavicular adenopathy thyroid normal Lungs  clear Cardiac RR, without RMG Abdominal  soft, nontender, without masses, organomegaly or hernia Breasts  examined lying and sitting without masses, retractions, discharge or axillary adenopathy. Pelvic  Ext/BUS/vagina with moderate atrophic changes  Cervix with mild atrophic changes. Pap/HPV  Uterus anteverted, normal size, shape and contour, midline and mobile nontender   Adnexa  Without masses or tenderness    Anus and perineum  Normal   Rectovaginal  Normal sphincter tone without palpated masses or tenderness.    Assessment/Plan:  63 y.o. G49P1001 female for annual exam.   1. Postmenopausal/HRT. Patient continues on Ogen 0.75 mg and Provera 2.5 mg daily. Has been no bleeding and is doing well from a symptom standpoint.  I again reviewed the whole issue of HRT with her to include the WHI study with increased risk of stroke, heart attack, DVT and breast cancer. The ACOG and NAMS statements for lowest dose for the shortest period of time reviewed. Transdermal versus oral first-pass effect benefit discussed. Whether to try to wean now for continue reviewed. Patient strongly wants to continue. She understands and accepts the above risks and I refilled her x1 year. Patient knows to call if any bleeding. 2. Leiomyoma. Patient has history of several small myomas on  ultrasound. Uterus palpates grossly normal size. We'll continue to monitor with annual exams. 3. Pap smear 2012. Pap/HPV today. No history of abnormal Pap smears previously. Plan repeat at 3-5 year interval assuming this Pap smear is normal according to current screening guidelines. 4. Mammography 10/2013. Continue with annual mammography. SBE monthly reviewed. 5. Colonoscopy 2013. Repeat at their recommended interval. 6. DEXA done through Dr. Barbie Banner office 10/2013 normal. Recommend repeat at 5 year interval. Patient is having issues with renal lithiasis and I recommended that she not take extra calcium at this time as this seems to exacerbate her kidney stones. Increased vitamin D recommendations reviewed 7. Health maintenance. No blood work done as this is done through Dr. Barbie Banner office. Followup one year, sooner as needed.  Note: This document was prepared with digital dictation and possible smart phrase technology. Any transcriptional errors that result from this process are unintentional.   [name] MD, 4:19 PM 01/29/2014

## 2014-01-29 NOTE — Patient Instructions (Signed)
Continue on hormone replacement therapy as we discussed. Call if any bleeding or any other issues.  You may obtain a copy of any labs that were done today by logging onto MyChart as outlined in the instructions provided with your AVS (after visit summary). The office will not call with normal lab results but certainly if there are any significant abnormalities then we will contact you.   Health Maintenance, Female A healthy lifestyle and preventative care can promote health and wellness.  Maintain regular health, dental, and eye exams.  Eat a healthy diet. Foods like vegetables, fruits, whole grains, low-fat dairy products, and lean protein foods contain the nutrients you need without too many calories. Decrease your intake of foods high in solid fats, added sugars, and salt. Get information about a proper diet from your caregiver, if necessary.  Regular physical exercise is one of the most important things you can do for your health. Most adults should get at least 150 minutes of moderate-intensity exercise (any activity that increases your heart rate and causes you to sweat) each week. In addition, most adults need muscle-strengthening exercises on 2 or more days a week.   Maintain a healthy weight. The body mass index (BMI) is a screening tool to identify possible weight problems. It provides an estimate of body fat based on height and weight. Your caregiver can help determine your BMI, and can help you achieve or maintain a healthy weight. For adults 20 years and older:  A BMI below 18.5 is considered underweight.  A BMI of 18.5 to 24.9 is normal.  A BMI of 25 to 29.9 is considered overweight.  A BMI of 30 and above is considered obese.  Maintain normal blood lipids and cholesterol by exercising and minimizing your intake of saturated fat. Eat a balanced diet with plenty of fruits and vegetables. Blood tests for lipids and cholesterol should begin at age 5 and be repeated every 5 years.  If your lipid or cholesterol levels are high, you are over 50, or you are a high risk for heart disease, you may need your cholesterol levels checked more frequently.Ongoing high lipid and cholesterol levels should be treated with medicines if diet and exercise are not effective.  If you smoke, find out from your caregiver how to quit. If you do not use tobacco, do not start.  Lung cancer screening is recommended for adults aged 77 80 years who are at high risk for developing lung cancer because of a history of smoking. Yearly low-dose computed tomography (CT) is recommended for people who have at least a 30-pack-year history of smoking and are a current smoker or have quit within the past 15 years. A pack year of smoking is smoking an average of 1 pack of cigarettes a day for 1 year (for example: 1 pack a day for 30 years or 2 packs a day for 15 years). Yearly screening should continue until the smoker has stopped smoking for at least 15 years. Yearly screening should also be stopped for people who develop a health problem that would prevent them from having lung cancer treatment.  If you are pregnant, do not drink alcohol. If you are breastfeeding, be very cautious about drinking alcohol. If you are not pregnant and choose to drink alcohol, do not exceed 1 drink per day. One drink is considered to be 12 ounces (355 mL) of beer, 5 ounces (148 mL) of wine, or 1.5 ounces (44 mL) of liquor.  Avoid use of street drugs.  Do not share needles with anyone. Ask for help if you need support or instructions about stopping the use of drugs.  High blood pressure causes heart disease and increases the risk of stroke. Blood pressure should be checked at least every 1 to 2 years. Ongoing high blood pressure should be treated with medicines, if weight loss and exercise are not effective.  If you are 34 to 62 years old, ask your caregiver if you should take aspirin to prevent strokes.  Diabetes screening involves  taking a blood sample to check your fasting blood sugar level. This should be done once every 3 years, after age 67, if you are within normal weight and without risk factors for diabetes. Testing should be considered at a younger age or be carried out more frequently if you are overweight and have at least 1 risk factor for diabetes.  Breast cancer screening is essential preventative care for women. You should practice "breast self-awareness." This means understanding the normal appearance and feel of your breasts and may include breast self-examination. Any changes detected, no matter how small, should be reported to a caregiver. Women in their 88s and 30s should have a clinical breast exam (CBE) by a caregiver as part of a regular health exam every 1 to 3 years. After age 61, women should have a CBE every year. Starting at age 29, women should consider having a mammogram (breast X-ray) every year. Women who have a family history of breast cancer should talk to their caregiver about genetic screening. Women at a high risk of breast cancer should talk to their caregiver about having an MRI and a mammogram every year.  Breast cancer gene (BRCA)-related cancer risk assessment is recommended for women who have family members with BRCA-related cancers. BRCA-related cancers include breast, ovarian, tubal, and peritoneal cancers. Having family members with these cancers may be associated with an increased risk for harmful changes (mutations) in the breast cancer genes BRCA1 and BRCA2. Results of the assessment will determine the need for genetic counseling and BRCA1 and BRCA2 testing.  The Pap test is a screening test for cervical cancer. Women should have a Pap test starting at age 63. Between ages 15 and 28, Pap tests should be repeated every 2 years. Beginning at age 73, you should have a Pap test every 3 years as long as the past 3 Pap tests have been normal. If you had a hysterectomy for a problem that was not  cancer or a condition that could lead to cancer, then you no longer need Pap tests. If you are between ages 15 and 26, and you have had normal Pap tests going back 10 years, you no longer need Pap tests. If you have had past treatment for cervical cancer or a condition that could lead to cancer, you need Pap tests and screening for cancer for at least 20 years after your treatment. If Pap tests have been discontinued, risk factors (such as a new sexual partner) need to be reassessed to determine if screening should be resumed. Some women have medical problems that increase the chance of getting cervical cancer. In these cases, your caregiver may recommend more frequent screening and Pap tests.  The human papillomavirus (HPV) test is an additional test that may be used for cervical cancer screening. The HPV test looks for the virus that can cause the cell changes on the cervix. The cells collected during the Pap test can be tested for HPV. The HPV test could be used  to screen women aged 70 years and older, and should be used in women of any age who have unclear Pap test results. After the age of 7, women should have HPV testing at the same frequency as a Pap test.  Colorectal cancer can be detected and often prevented. Most routine colorectal cancer screening begins at the age of 66 and continues through age 58. However, your caregiver may recommend screening at an earlier age if you have risk factors for colon cancer. On a yearly basis, your caregiver may provide home test kits to check for hidden blood in the stool. Use of a small camera at the end of a tube, to directly examine the colon (sigmoidoscopy or colonoscopy), can detect the earliest forms of colorectal cancer. Talk to your caregiver about this at age 41, when routine screening begins. Direct examination of the colon should be repeated every 5 to 10 years through age 64, unless early forms of pre-cancerous polyps or small growths are  found.  Hepatitis C blood testing is recommended for all people born from [birth year] through 1964 and any individual with known risks for hepatitis C.  Practice safe sex. Use condoms and avoid high-risk sexual practices to reduce the spread of sexually transmitted infections (STIs). Sexually active women aged 48 and younger should be checked for Chlamydia, which is a common sexually transmitted infection. Older women with new or multiple partners should also be tested for Chlamydia. Testing for other STIs is recommended if you are sexually active and at increased risk.  Osteoporosis is a disease in which the bones lose minerals and strength with aging. This can result in serious bone fractures. The risk of osteoporosis can be identified using a bone density scan. Women ages 38 and over and women at risk for fractures or osteoporosis should discuss screening with their caregivers. Ask your caregiver whether you should be taking a calcium supplement or vitamin D to reduce the rate of osteoporosis.  Menopause can be associated with physical symptoms and risks. Hormone replacement therapy is available to decrease symptoms and risks. You should talk to your caregiver about whether hormone replacement therapy is right for you.  Use sunscreen. Apply sunscreen liberally and repeatedly throughout the day. You should seek shade when your shadow is shorter than you. Protect yourself by wearing long sleeves, pants, a wide-brimmed hat, and sunglasses year round, whenever you are outdoors.  Notify your caregiver of new moles or changes in moles, especially if there is a change in shape or color. Also notify your caregiver if a mole is larger than the size of a pencil eraser.  Stay current with your immunizations. Document Released: 05/01/2011 Document Revised: 02/10/2013 Document Reviewed: 05/01/2011 [facility] Patient Information 2014 [place].

## 2014-01-30 LAB — URINALYSIS W MICROSCOPIC + REFLEX CULTURE
Bilirubin Urine: NEGATIVE
Casts: NONE SEEN
Crystals: NONE SEEN
Glucose, UA: NEGATIVE mg/dL
Hgb urine dipstick: NEGATIVE
Ketones, ur: NEGATIVE mg/dL
Nitrite: POSITIVE — AB
Protein, ur: NEGATIVE mg/dL
Specific Gravity, Urine: 1.015 (ref 1.005–1.030)
Squamous Epithelial / LPF: NONE SEEN
Urobilinogen, UA: 0.2 mg/dL (ref 0.0–1.0)
pH: 6.5 (ref 5.0–8.0)

## 2014-02-01 LAB — URINE CULTURE: Colony Count: >=100000

## 2014-02-02 ENCOUNTER — Other Ambulatory Visit: Payer: Self-pay | Admitting: *Deleted

## 2014-02-02 MED ORDER — SULFAMETHOXAZOLE-TMP DS 800-160 MG PO TABS: 1.0000 | ORAL_TABLET | Freq: Two times a day (BID) | ORAL | Status: DC

## 2014-02-03 ENCOUNTER — Encounter: Payer: Self-pay | Admitting: Gynecology

## 2014-02-03 ENCOUNTER — Other Ambulatory Visit: Payer: Self-pay | Admitting: Gynecology

## 2014-02-03 DIAGNOSIS — N390 Urinary tract infection, site not specified: Secondary | ICD-10-CM

## 2014-02-09 ENCOUNTER — Encounter: Payer: Self-pay | Admitting: Gynecology

## 2014-07-21 ENCOUNTER — Other Ambulatory Visit (INDEPENDENT_AMBULATORY_CARE_PROVIDER_SITE_OTHER): Payer: 59

## 2014-07-21 DIAGNOSIS — Z0000 Encounter for general adult medical examination without abnormal findings: Secondary | ICD-10-CM

## 2014-07-21 LAB — TSH: TSH: 3.17 u[IU]/mL (ref 0.35–4.50)

## 2014-07-21 LAB — BASIC METABOLIC PANEL
BUN: 19 mg/dL (ref 6–23)
CO2: 32 mEq/L (ref 19–32)
Calcium: 9.4 mg/dL (ref 8.4–10.5)
Chloride: 102 mEq/L (ref 96–112)
Creatinine, Ser: 0.9 mg/dL (ref 0.4–1.2)
GFR: 69.16 mL/min (ref >60.00)
Glucose, Bld: 109 mg/dL — ABNORMAL HIGH (ref 70–99)
Potassium: 4.3 mEq/L (ref 3.5–5.1)
Sodium: 140 mEq/L (ref 135–145)

## 2014-07-21 LAB — POCT URINALYSIS DIPSTICK
Bilirubin, UA: NEGATIVE
Glucose, UA: NEGATIVE
Ketones, UA: NEGATIVE
Nitrite, UA: POSITIVE
Protein, UA: NEGATIVE
Spec Grav, UA: 1.01
Urobilinogen, UA: 0.2
pH, UA: 7.5

## 2014-07-21 LAB — CBC WITH DIFFERENTIAL/PLATELET
Basophils Absolute: 0 10*3/uL (ref 0.0–0.1)
Basophils Relative: 0.3 % (ref 0.0–3.0)
Eosinophils Absolute: 0.1 10*3/uL (ref 0.0–0.7)
Eosinophils Relative: 1.5 % (ref 0.0–5.0)
HCT: 44.1 % (ref 36.0–46.0)
Hemoglobin: 14.7 g/dL (ref 12.0–15.0)
Lymphocytes Relative: 31.8 % (ref 12.0–46.0)
Lymphs Abs: 1.8 10*3/uL (ref 0.7–4.0)
MCHC: 33.4 g/dL (ref 30.0–36.0)
MCV: 89.2 fl (ref 78.0–100.0)
Monocytes Absolute: 0.7 10*3/uL (ref 0.1–1.0)
Monocytes Relative: 12.7 % — ABNORMAL HIGH (ref 3.0–12.0)
Neutro Abs: 3.1 10*3/uL (ref 1.4–7.7)
Neutrophils Relative %: 53.7 % (ref 43.0–77.0)
Platelets: 225 10*3/uL (ref 150.0–400.0)
RBC: 4.95 Mil/uL (ref 3.87–5.11)
RDW: 13.9 % (ref 11.5–15.5)
WBC: 5.8 10*3/uL (ref 4.0–10.5)

## 2014-07-21 LAB — LIPID PANEL
Cholesterol: 240 mg/dL — ABNORMAL HIGH (ref 0–200)
HDL: 39.2 mg/dL (ref >39.00)
LDL Cholesterol: 174 mg/dL — ABNORMAL HIGH (ref 0–99)
NonHDL: 200.8
Total CHOL/HDL Ratio: 6
Triglycerides: 134 mg/dL (ref 0.0–149.0)
VLDL: 26.8 mg/dL (ref 0.0–40.0)

## 2014-07-21 LAB — HEPATIC FUNCTION PANEL
ALT: 36 U/L — ABNORMAL HIGH (ref 0–35)
AST: 36 U/L (ref 0–37)
Albumin: 4 g/dL (ref 3.5–5.2)
Alkaline Phosphatase: 61 U/L (ref 39–117)
Bilirubin, Direct: 0.1 mg/dL (ref 0.0–0.3)
Total Bilirubin: 0.5 mg/dL (ref 0.2–1.2)
Total Protein: 7.4 g/dL (ref 6.0–8.3)

## 2014-07-21 NOTE — Addendum Note (Signed)
Addended by: Denna Haggard K on: 07/21/2014 11:05 AM   Modules accepted: Orders

## 2014-07-24 LAB — URINE CULTURE: Colony Count: >=100000

## 2014-07-27 MED ORDER — NITROFURANTOIN MONOHYD MACRO 100 MG PO CAPS: 100.0000 mg | ORAL_CAPSULE | Freq: Two times a day (BID) | ORAL | Status: DC

## 2014-07-27 NOTE — Addendum Note (Signed)
Addended by: Aggie Hacker A on: 07/27/2014 01:41 PM   Modules accepted: Orders

## 2014-07-28 ENCOUNTER — Ambulatory Visit (INDEPENDENT_AMBULATORY_CARE_PROVIDER_SITE_OTHER): Payer: 59 | Admitting: Family Medicine

## 2014-07-28 ENCOUNTER — Encounter: Payer: Self-pay | Admitting: Family Medicine

## 2014-07-28 VITALS — BP 130/72 | HR 81 | Temp 99.0°F | Ht 67.0 in | Wt 197.0 lb

## 2014-07-28 DIAGNOSIS — Z0000 Encounter for general adult medical examination without abnormal findings: Secondary | ICD-10-CM

## 2014-07-28 MED ORDER — HYDROCHLOROTHIAZIDE 25 MG PO TABS: 25.0000 mg | ORAL_TABLET | Freq: Every day | ORAL | Status: DC

## 2014-07-28 MED ORDER — PREDNISONE 10 MG PO TABS: 10.0000 mg | ORAL_TABLET | Freq: Every day | ORAL | Status: DC

## 2014-07-28 MED ORDER — EZETIMIBE 10 MG PO TABS: 10.0000 mg | ORAL_TABLET | Freq: Every day | ORAL | Status: DC

## 2014-07-28 MED ORDER — FIORICET 50-325-40 MG PO TABS: ORAL_TABLET | ORAL | Status: DC

## 2014-07-28 MED ORDER — PROPRANOLOL HCL 40 MG PO TABS: ORAL_TABLET | ORAL | Status: DC

## 2014-07-28 MED ORDER — BETAMETHASONE VALERATE 0.1 % EX LOTN: TOPICAL_LOTION | Freq: Two times a day (BID) | CUTANEOUS | Status: DC

## 2014-07-28 MED ORDER — HYOSCYAMINE SULFATE 0.125 MG SL SUBL: SUBLINGUAL_TABLET | SUBLINGUAL | Status: DC

## 2014-07-28 MED ORDER — BENZOYL PEROXIDE 5 % EX GEL: Freq: Every day | CUTANEOUS | Status: DC

## 2014-07-28 NOTE — Progress Notes (Signed)
   Subjective:    Patient ID: Victoria Mosley, female    DOB: 1951/06/25, 63 y.o.   MRN: [id number]  HPI 63 yr old female for a cpx. She feels well except for right knee pain. She is scheduled for a TKA per Dr. Wynelle Link on 12-07-14. She has not been able to exercise due to this , but she does try to watch her diet. Despite this her lipid levels remain high. She does not tolerate statins.    Review of Systems  Constitutional: Negative.   HENT: Negative.   Eyes: Negative.   Respiratory: Negative.   Cardiovascular: Negative.   Gastrointestinal: Negative.   Genitourinary: Negative for dysuria, urgency, frequency, hematuria, flank pain, decreased urine volume, enuresis, difficulty urinating, pelvic pain and dyspareunia.  Musculoskeletal: Positive for arthralgias and gait problem. Negative for back pain, joint swelling, myalgias, neck pain and neck stiffness.  Skin: Negative.   Neurological: Negative.   Psychiatric/Behavioral: Negative.        Objective:   Physical Exam  Constitutional: She is oriented to person, place, and time. She appears well-developed and well-nourished. No distress.  HENT:  Head: Normocephalic and atraumatic.  Right Ear: External ear normal.  Left Ear: External ear normal.  Nose: Nose normal.  Mouth/Throat: Oropharynx is clear and moist. No oropharyngeal exudate.  Eyes: Conjunctivae and EOM are normal. Pupils are equal, round, and reactive to light. No scleral icterus.  Neck: Normal range of motion. Neck supple. No JVD present. No thyromegaly present.  Cardiovascular: Normal rate, regular rhythm, normal heart sounds and intact distal pulses.  Exam reveals no gallop and no friction rub.   No murmur heard. EKG normal   Pulmonary/Chest: Effort normal and breath sounds normal. No respiratory distress. She has no wheezes. She has no rales. She exhibits no tenderness.  Abdominal: Soft. Bowel sounds are normal. She exhibits no distension and no mass. There is no  tenderness. There is no rebound and no guarding.  Musculoskeletal: Normal range of motion. She exhibits no edema and no tenderness.  Lymphadenopathy:    She has no cervical adenopathy.  Neurological: She is alert and oriented to person, place, and time. She has normal reflexes. No cranial nerve deficit. She exhibits normal muscle tone. Coordination normal.  Skin: Skin is warm and dry. No rash noted. No erythema.  Psychiatric: She has a normal mood and affect. Her behavior is normal. Judgment and thought content normal.          Assessment & Plan:  Well exam. Add Zetia 10 mg daily. Recheck lipids in 90 days. She is cleared for her upcoming surgery.

## 2014-07-28 NOTE — Progress Notes (Signed)
Pre visit review using our clinic review tool, if applicable. No additional management support is needed unless otherwise documented below in the visit note. 

## 2014-07-30 ENCOUNTER — Other Ambulatory Visit: Payer: Self-pay | Admitting: Family Medicine

## 2014-08-31 ENCOUNTER — Encounter: Payer: Self-pay | Admitting: Family Medicine

## 2014-09-01 ENCOUNTER — Telehealth: Payer: Self-pay | Admitting: Family Medicine

## 2014-09-01 NOTE — Telephone Encounter (Signed)
Victoria Mosley called to request a change in dosage, pt would like to go back on Fioricet 50-300-40 mg capsule. She was prescribed a tablet this last time and its a different dosage. Can we make this change?

## 2014-09-02 NOTE — Telephone Encounter (Signed)
Cancel the old Fioricet rx and call in Fioricet 50-300-40 capsules instead to use bid prn migraines, #60 with 5 rf

## 2014-09-04 MED ORDER — BUTALBITAL-APAP-CAFFEINE 50-300-40 MG PO CAPS: 1.0000 | ORAL_CAPSULE | Freq: Two times a day (BID) | ORAL | Status: DC

## 2014-09-04 NOTE — Telephone Encounter (Signed)
Old rx cancelled and new Rx called in.

## 2014-11-02 ENCOUNTER — Telehealth: Payer: Self-pay | Admitting: Family Medicine

## 2014-11-02 ENCOUNTER — Other Ambulatory Visit: Payer: Self-pay | Admitting: Family Medicine

## 2014-11-02 DIAGNOSIS — E785 Hyperlipidemia, unspecified: Secondary | ICD-10-CM

## 2014-11-02 NOTE — Telephone Encounter (Signed)
Patient wants to [facility] to have lipids drawn. Please enter order.  She is going 11/04/14 morning.

## 2014-11-02 NOTE — Telephone Encounter (Signed)
Order placed

## 2014-11-04 ENCOUNTER — Other Ambulatory Visit (INDEPENDENT_AMBULATORY_CARE_PROVIDER_SITE_OTHER): Payer: Self-pay

## 2014-11-04 DIAGNOSIS — E785 Hyperlipidemia, unspecified: Secondary | ICD-10-CM

## 2014-11-04 LAB — LIPID PANEL
Cholesterol: 208 mg/dL — ABNORMAL HIGH (ref 0–200)
HDL: 40.8 mg/dL (ref >39.00)
LDL Cholesterol: 135 mg/dL — ABNORMAL HIGH (ref 0–99)
NonHDL: 167.2
Total CHOL/HDL Ratio: 5
Triglycerides: 161 mg/dL — ABNORMAL HIGH (ref 0.0–149.0)
VLDL: 32.2 mg/dL (ref 0.0–40.0)

## 2014-11-06 ENCOUNTER — Telehealth: Payer: Self-pay | Admitting: Family Medicine

## 2014-11-06 NOTE — Telephone Encounter (Signed)
I left a message with results and released them in my chart.

## 2014-11-06 NOTE — Telephone Encounter (Signed)
Pt req call back about lab results .. °

## 2014-11-11 ENCOUNTER — Telehealth: Payer: Self-pay | Admitting: Family Medicine

## 2014-11-11 MED ORDER — GEMFIBROZIL 600 MG PO TABS: 600.0000 mg | ORAL_TABLET | Freq: Two times a day (BID) | ORAL | Status: DC

## 2014-11-11 NOTE — Telephone Encounter (Signed)
Pt would like to change from Zetia to Lopid due to cost, send in a 90 day supply to [place].

## 2014-11-11 NOTE — Telephone Encounter (Signed)
done

## 2014-11-16 ENCOUNTER — Telehealth: Payer: Self-pay | Admitting: Family Medicine

## 2014-11-16 NOTE — Telephone Encounter (Signed)
Pt called to say that she is having a knee replacement on 12/07/14. Dr Wynelle Link office is asking for a medical clearance .    [phone] pt phone number

## 2014-11-16 NOTE — Telephone Encounter (Signed)
Pt was seen recently for a CPE, had a EKG during that visit. Per Dr. Sarajane Jews we just need the form to fill out. I spoke with pt and she will request this form from Dr. Wynelle Link, they will fax over to [place] and we will fill out and fax back.

## 2014-11-19 ENCOUNTER — Ambulatory Visit: Payer: Self-pay | Admitting: Orthopedic Surgery

## 2014-11-19 NOTE — Progress Notes (Signed)
Preoperative surgical orders have been place into the Epic hospital system for Victoria Mosley on 11/19/2014, 5:45 PM  by Mickel Crow for surgery on 12-07-2014.  Preop Total Knee orders including Experal, IV Tylenol, and IV Decadron as long as there are no contraindications to the above medications. Arlee Muslim, PA-C

## 2014-11-23 ENCOUNTER — Encounter: Payer: Self-pay | Admitting: Gynecology

## 2014-11-30 ENCOUNTER — Ambulatory Visit (HOSPITAL_COMMUNITY)
Admission: RE | Admit: 2014-11-30 | Discharge: 2014-11-30 | Disposition: A | Discharge status: Home / Self Care | Payer: 59 | Source: Ambulatory Visit | Attending: Anesthesiology | Admitting: Anesthesiology

## 2014-11-30 ENCOUNTER — Encounter: Payer: Self-pay | Admitting: Family Medicine

## 2014-11-30 ENCOUNTER — Encounter (HOSPITAL_COMMUNITY): Payer: Self-pay

## 2014-11-30 ENCOUNTER — Ambulatory Visit (INDEPENDENT_AMBULATORY_CARE_PROVIDER_SITE_OTHER): Payer: 59 | Admitting: Family Medicine

## 2014-11-30 ENCOUNTER — Encounter (HOSPITAL_COMMUNITY)
Admission: RE | Admit: 2014-11-30 | Discharge: 2014-11-30 | Disposition: A | Discharge status: Home / Self Care | Payer: 59 | Source: Ambulatory Visit | Attending: Orthopedic Surgery | Admitting: Orthopedic Surgery

## 2014-11-30 VITALS — BP 117/78 | Temp 99.2°F | Resp 12 | Wt 196.0 lb

## 2014-11-30 DIAGNOSIS — J189 Pneumonia, unspecified organism: Secondary | ICD-10-CM

## 2014-11-30 DIAGNOSIS — J181 Lobar pneumonia, unspecified organism: Principal | ICD-10-CM

## 2014-11-30 DIAGNOSIS — R05 Cough: Secondary | ICD-10-CM | POA: Diagnosis present

## 2014-11-30 DIAGNOSIS — R059 Cough, unspecified: Secondary | ICD-10-CM

## 2014-11-30 HISTORY — DX: Urinary tract infection, site not specified: N39.0

## 2014-11-30 HISTORY — DX: Cough: R05

## 2014-11-30 HISTORY — DX: Cough, unspecified: R05.9

## 2014-11-30 LAB — COMPREHENSIVE METABOLIC PANEL
ALT: 27 U/L (ref 0–35)
AST: 32 U/L (ref 0–37)
Albumin: 3.8 g/dL (ref 3.5–5.2)
Alkaline Phosphatase: 75 U/L (ref 39–117)
Anion gap: 9 (ref 5–15)
BUN: 20 mg/dL (ref 6–23)
CO2: 31 mmol/L (ref 19–32)
Calcium: 9.8 mg/dL (ref 8.4–10.5)
Chloride: 99 mmol/L (ref 96–112)
Creatinine, Ser: 0.77 mg/dL (ref 0.50–1.10)
GFR calc Af Amer: >90 mL/min (ref >90)
GFR calc non Af Amer: 88 mL/min — ABNORMAL LOW (ref >90)
Glucose, Bld: 116 mg/dL — ABNORMAL HIGH (ref 70–99)
Potassium: 3.8 mmol/L (ref 3.5–5.1)
Sodium: 139 mmol/L (ref 135–145)
Total Bilirubin: 0.5 mg/dL (ref 0.3–1.2)
Total Protein: 7.3 g/dL (ref 6.0–8.3)

## 2014-11-30 LAB — SURGICAL PCR SCREEN
MRSA, PCR: NEGATIVE
Staphylococcus aureus: POSITIVE — AB

## 2014-11-30 LAB — CBC
HCT: 44 % (ref 36.0–46.0)
Hemoglobin: 14.3 g/dL (ref 12.0–15.0)
MCH: 28.6 pg (ref 26.0–34.0)
MCHC: 32.5 g/dL (ref 30.0–36.0)
MCV: 88 fL (ref 78.0–100.0)
Platelets: 264 10*3/uL (ref 150–400)
RBC: 5 MIL/uL (ref 3.87–5.11)
RDW: 14.3 % (ref 11.5–15.5)
WBC: 7.1 10*3/uL (ref 4.0–10.5)

## 2014-11-30 LAB — PROTIME-INR
INR: 1.01 (ref 0.00–1.49)
Prothrombin Time: 13.4 seconds (ref 11.6–15.2)

## 2014-11-30 LAB — APTT: aPTT: 41 seconds — ABNORMAL HIGH (ref 24–37)

## 2014-11-30 MED ORDER — LEVOFLOXACIN 500 MG PO TABS: 500.0000 mg | ORAL_TABLET | Freq: Every day | ORAL | Status: DC

## 2014-11-30 MED ORDER — HYDROCODONE-HOMATROPINE 5-1.5 MG/5ML PO SYRP: 5.0000 mL | ORAL_SOLUTION | ORAL | Status: DC | PRN

## 2014-11-30 NOTE — Progress Notes (Signed)
Pre visit review using our clinic review tool, if applicable. No additional management support is needed unless otherwise documented below in the visit note. 

## 2014-11-30 NOTE — Progress Notes (Signed)
Clearance dr fry  Chart ekg 9/15 on chart and epic Patient states was seen by Dr Louis Meckel 11/27/14, u/a with culture done there.  Did not repeat as pst visit- obtained results- faxed u/a, culture, chest x ray to dr Wynelle Link

## 2014-11-30 NOTE — Patient Instructions (Addendum)
12/07/14  MONDAY  Your procedure is scheduled on:  12/07/14  Report to [facility] at 0500       AM.   Call this number if you have problems the morning of surgery: [phone]        Do not eat food  Or drink :After Midnight. Sunday NIGHT   Take these medicines the morning of surgery with A SIP OF WATER: Loratadine  .  Contacts, dentures or partial plates, or metal hairpins  can not be worn to surgery. Your family will be responsible for glasses, dentures, hearing aides while you are in surgery  Leave suitcase in the car. After surgery it may be brought to your room.  For patients admitted to the hospital, checkout time is 11:00 AM day of  discharge.         [facility] IS NOT RESPONSIBLE FOR ANY VALUABLES  Patients discharged the day of surgery will not be allowed to drive home. IF going home the day of surgery, you must have a driver and someone to stay with you for the first 24 hours  Name and phone number of your driver:    husband                                                                                                                                      WHAT IS A BLOOD TRANSFUSION? Blood Transfusion Information  A transfusion is the replacement of blood or some of its parts. Blood is made up of multiple cells which provide different functions.  Red blood cells carry oxygen and are used for blood loss replacement.  White blood cells fight against infection.  Platelets control bleeding.  Plasma helps clot blood.  Other blood products are available for specialized needs, such as hemophilia or other clotting disorders. BEFORE THE TRANSFUSION  Who gives blood for transfusions?   Healthy volunteers who are fully evaluated to make sure their blood is safe. This is blood bank blood. Transfusion therapy is the safest it has ever been in the practice of medicine. Before blood is taken from a donor,  a complete history is taken to make sure that person has no history of diseases nor engages in risky social behavior (examples are intravenous drug use or sexual activity with multiple partners). The donor's travel history is screened to minimize risk of transmitting infections, such as malaria. The donated blood is tested for signs of infectious diseases, such as HIV and hepatitis. The blood is then tested to be sure it is compatible with you in order to minimize the chance of a transfusion reaction. If you or a relative donates blood, this is often done in anticipation of surgery and is not appropriate for emergency situations. It takes many days to process the donated blood. RISKS AND COMPLICATIONS Although transfusion therapy is very  safe and saves many lives, the main dangers of transfusion include:  1. Getting an infectious disease. 2. Developing a transfusion reaction. This is an allergic reaction to something in the blood you were given. Every precaution is taken to prevent this. The decision to have a blood transfusion has been considered carefully by your caregiver before blood is given. Blood is not given unless the benefits outweigh the risks. AFTER THE TRANSFUSION  Right after receiving a blood transfusion, you will usually feel much better and more energetic. This is especially true if your red blood cells have gotten low (anemic). The transfusion raises the level of the red blood cells which carry oxygen, and this usually causes an energy increase.  The nurse administering the transfusion will monitor you carefully for complications. HOME CARE INSTRUCTIONS  No special instructions are needed after a transfusion. You may find your energy is better. Speak with your caregiver about any limitations on activity for underlying diseases you may have. SEEK MEDICAL CARE IF:   Your condition is not improving after your transfusion.  You develop redness or irritation at the intravenous (IV)  site. SEEK IMMEDIATE MEDICAL CARE IF:  Any of the following symptoms occur over the next 12 hours:  Shaking chills.  You have a temperature by mouth above 102 F (38.9 C), not controlled by medicine.  Chest, back, or muscle pain.  People around you feel you are not acting correctly or are confused.  Shortness of breath or difficulty breathing.  Dizziness and fainting.  You get a rash or develop hives.  You have a decrease in urine output.  Your urine turns a dark color or changes to pink, red, or brown. Any of the following symptoms occur over the next 10 days:  You have a temperature by mouth above 102 F (38.9 C), not controlled by medicine.  Shortness of breath.  Weakness after normal activity.  The white part of the eye turns yellow (jaundice).  You have a decrease in the amount of urine or are urinating less often.  Your urine turns a dark color or changes to pink, red, or brown. Document Released: 10/13/2000 Document Revised: 01/08/2012 Document Reviewed: 06/01/2008 ExitCare Patient Information 2014 [place].  _______________________________________________________________________  Incentive Spirometer  An incentive spirometer is a tool that can help keep your lungs clear and active. This tool measures how well you are filling your lungs with each breath. Taking long deep breaths may help reverse or decrease the chance of developing breathing (pulmonary) problems (especially infection) following:  A long period of time when you are unable to move or be active. BEFORE THE PROCEDURE   If the spirometer includes an indicator to show your best effort, your nurse or respiratory therapist will set it to a desired goal.  If possible, sit up straight or lean slightly forward. Try not to slouch.  Hold the incentive spirometer in an upright position. INSTRUCTIONS FOR USE  3. Sit on the edge of your bed if possible, or sit up as far as you can in bed or on a  chair. 4. Hold the incentive spirometer in an upright position. 5. Breathe out normally. 6. Place the mouthpiece in your mouth and seal your lips tightly around it. 7. Breathe in slowly and as deeply as possible, raising the piston or the ball toward the top of the column. 8. Hold your breath for 3-5 seconds or for as long as possible. Allow the piston or ball to fall to the bottom of  the column. 9. Remove the mouthpiece from your mouth and breathe out normally. 10. Rest for a few seconds and repeat Steps 1 through 7 at least 10 times every 1-2 hours when you are awake. Take your time and take a few normal breaths between deep breaths. 11. The spirometer may include an indicator to show your best effort. Use the indicator as a goal to work toward during each repetition. 12. After each set of 10 deep breaths, practice coughing to be sure your lungs are clear. If you have an incision (the cut made at the time of surgery), support your incision when coughing by placing a pillow or rolled up towels firmly against it. Once you are able to get out of bed, walk around indoors and cough well. You may stop using the incentive spirometer when instructed by your caregiver.  RISKS AND COMPLICATIONS  Take your time so you do not get dizzy or light-headed.  If you are in pain, you may need to take or ask for pain medication before doing incentive spirometry. It is harder to take a deep breath if you are having pain. AFTER USE  Rest and breathe slowly and easily.  It can be helpful to keep track of a log of your progress. Your caregiver can provide you with a simple table to help with this. If you are using the spirometer at home, follow these instructions: [place] IF:   You are having difficultly using the spirometer.  You have trouble using the spirometer as often as instructed.  Your pain medication is not giving enough relief while using the spirometer.  You develop fever of 100.5 F  (38.1 C) or higher. SEEK IMMEDIATE MEDICAL CARE IF:   You cough up bloody sputum that had not been present before.  You develop fever of 102 F (38.9 C) or greater.  You develop worsening pain at or near the incision site. MAKE SURE YOU:   Understand these instructions.  Will watch your condition.  Will get help right away if you are not doing well or get worse. Document Released: 02/26/2007 Document Revised: 01/08/2012 Document Reviewed: 04/29/2007 ExitCare Patient Information 2014 [place].   ________________________________________________________________________ [facility] - Preparing for Surgery Before surgery, you can play an important role.  Because skin is not sterile, your skin needs to be as free of germs as possible.  You can reduce the number of germs on your skin by washing with CHG (chlorahexidine gluconate) soap before surgery.  CHG is an antiseptic cleaner which kills germs and bonds with the skin to continue killing germs even after washing. Please DO NOT use if you have an allergy to CHG or antibacterial soaps.  If your skin becomes reddened/irritated stop using the CHG and inform your nurse when you arrive at Short Stay. Do not shave (including legs and underarms) for at least 48 hours prior to the first CHG shower.  You may shave your face/neck. Please follow these instructions carefully:  1.  Shower with CHG Soap the night before surgery and the  morning of Surgery.  2.  If you choose to wash your hair, wash your hair first as usual with your  normal  shampoo.  3.  After you shampoo, rinse your hair and body thoroughly to remove the  shampoo.                           4.  Use CHG as you would  any other liquid soap.  You can apply chg directly  to the skin and wash                       Gently with a scrungie or clean washcloth.  5.  Apply the CHG Soap to your body ONLY FROM THE NECK DOWN.   Do not use on face/ open                           Wound or open  sores. Avoid contact with eyes, ears mouth and genitals (private parts).                       Wash face,  Genitals (private parts) with your normal soap.             6.  Wash thoroughly, paying special attention to the area where your surgery  will be performed.  7.  Thoroughly rinse your body with warm water from the neck down.  8.  DO NOT shower/wash with your normal soap after using and rinsing off  the CHG Soap.                9.  Pat yourself dry with a clean towel.            10.  Wear clean pajamas.            11.  Place clean sheets on your bed the night of your first shower and do not  sleep with pets. Day of Surgery : Do not apply any lotions/deodorants the morning of surgery.  Please wear clean clothes to the hospital/surgery center.  FAILURE TO FOLLOW THESE INSTRUCTIONS MAY RESULT IN THE CANCELLATION OF YOUR SURGERY PATIENT SIGNATURE_________________________________  NURSE SIGNATURE__________________________________  ________________________________________________________________________

## 2014-11-30 NOTE — Progress Notes (Signed)
   Subjective:    Patient ID: Victoria Mosley, female    DOB: [birth year], 64 y.o.   MRN: [id number]  HPI Here for 10 days of chest congestion and coughing up green sputum. She has developed a mild fever today. She is to start taking Macrobid tonight for a UTI per urologist. As part of her pre-operative workup for her knee replacement set for 12-07-14 she had a CXR this morning, and it showed a possible RLL pneumonia.    Review of Systems  Constitutional: Positive for fever.  HENT: Negative.   Eyes: Negative.   Respiratory: Positive for cough and chest tightness.   Cardiovascular: Positive for chest pain. Negative for palpitations and leg swelling.       Objective:   Physical Exam  Constitutional: She appears well-developed and well-nourished.  HENT:  Right Ear: External ear normal.  Left Ear: External ear normal.  Nose: Nose normal.  Mouth/Throat: Oropharynx is clear and moist.  Eyes: Conjunctivae are normal.  Pulmonary/Chest: Effort normal. No respiratory distress. She has no wheezes.  She has scattered rhonchi and a patch of rales at the right posterior base. She is tender along the right lower lateral ribs   Lymphadenopathy:    She has no cervical adenopathy.          Assessment & Plan:  She has a small pneumonia which we will treat with Levaquin. The rib pain is muscular from coughing. She will start the [place] as well. Recheck later this week. We will assess at that time whether she will be able to have her surgery as planned or not.

## 2014-12-01 ENCOUNTER — Telehealth: Payer: Self-pay | Admitting: Family Medicine

## 2014-12-01 ENCOUNTER — Encounter (HOSPITAL_COMMUNITY): Payer: Self-pay | Admitting: Emergency Medicine

## 2014-12-01 ENCOUNTER — Emergency Department (HOSPITAL_COMMUNITY)
Admission: EM | Admit: 2014-12-01 | Discharge: 2014-12-01 | Disposition: A | Discharge status: Home / Self Care | Payer: 59 | Attending: Emergency Medicine | Admitting: Emergency Medicine

## 2014-12-01 DIAGNOSIS — T378X5A Adverse effect of other specified systemic anti-infectives and antiparasitics, initial encounter: Secondary | ICD-10-CM | POA: Diagnosis not present

## 2014-12-01 DIAGNOSIS — Q6102 Congenital multiple renal cysts: Secondary | ICD-10-CM | POA: Insufficient documentation

## 2014-12-01 DIAGNOSIS — Z8619 Personal history of other infectious and parasitic diseases: Secondary | ICD-10-CM | POA: Insufficient documentation

## 2014-12-01 DIAGNOSIS — R112 Nausea with vomiting, unspecified: Secondary | ICD-10-CM | POA: Diagnosis present

## 2014-12-01 DIAGNOSIS — F419 Anxiety disorder, unspecified: Secondary | ICD-10-CM | POA: Diagnosis not present

## 2014-12-01 DIAGNOSIS — Z780 Asymptomatic menopausal state: Secondary | ICD-10-CM | POA: Insufficient documentation

## 2014-12-01 DIAGNOSIS — G43909 Migraine, unspecified, not intractable, without status migrainosus: Secondary | ICD-10-CM | POA: Diagnosis not present

## 2014-12-01 DIAGNOSIS — E785 Hyperlipidemia, unspecified: Secondary | ICD-10-CM | POA: Insufficient documentation

## 2014-12-01 DIAGNOSIS — Z85820 Personal history of malignant melanoma of skin: Secondary | ICD-10-CM | POA: Insufficient documentation

## 2014-12-01 DIAGNOSIS — Z7952 Long term (current) use of systemic steroids: Secondary | ICD-10-CM | POA: Diagnosis not present

## 2014-12-01 DIAGNOSIS — T402X5A Adverse effect of other opioids, initial encounter: Secondary | ICD-10-CM | POA: Insufficient documentation

## 2014-12-01 DIAGNOSIS — T368X5A Adverse effect of other systemic antibiotics, initial encounter: Secondary | ICD-10-CM | POA: Diagnosis not present

## 2014-12-01 DIAGNOSIS — T50905A Adverse effect of unspecified drugs, medicaments and biological substances, initial encounter: Secondary | ICD-10-CM

## 2014-12-01 DIAGNOSIS — T507X5A Adverse effect of analeptics and opioid receptor antagonists, initial encounter: Secondary | ICD-10-CM | POA: Diagnosis not present

## 2014-12-01 DIAGNOSIS — Z87440 Personal history of urinary (tract) infections: Secondary | ICD-10-CM | POA: Diagnosis not present

## 2014-12-01 DIAGNOSIS — K219 Gastro-esophageal reflux disease without esophagitis: Secondary | ICD-10-CM | POA: Insufficient documentation

## 2014-12-01 DIAGNOSIS — Z87442 Personal history of urinary calculi: Secondary | ICD-10-CM | POA: Diagnosis not present

## 2014-12-01 DIAGNOSIS — Z872 Personal history of diseases of the skin and subcutaneous tissue: Secondary | ICD-10-CM | POA: Diagnosis not present

## 2014-12-01 DIAGNOSIS — M1990 Unspecified osteoarthritis, unspecified site: Secondary | ICD-10-CM | POA: Diagnosis not present

## 2014-12-01 DIAGNOSIS — T443X5A Adverse effect of other parasympatholytics [anticholinergics and antimuscarinics] and spasmolytics, initial encounter: Secondary | ICD-10-CM | POA: Diagnosis not present

## 2014-12-01 DIAGNOSIS — Z87891 Personal history of nicotine dependence: Secondary | ICD-10-CM | POA: Diagnosis not present

## 2014-12-01 DIAGNOSIS — Z79899 Other long term (current) drug therapy: Secondary | ICD-10-CM | POA: Diagnosis not present

## 2014-12-01 LAB — BASIC METABOLIC PANEL
Anion gap: 10 (ref 5–15)
BUN: 16 mg/dL (ref 6–23)
CO2: 28 mmol/L (ref 19–32)
Calcium: 8.6 mg/dL (ref 8.4–10.5)
Chloride: 100 mmol/L (ref 96–112)
Creatinine, Ser: 0.78 mg/dL (ref 0.50–1.10)
GFR calc Af Amer: >90 mL/min (ref >90)
GFR calc non Af Amer: 88 mL/min — ABNORMAL LOW (ref >90)
Glucose, Bld: 124 mg/dL — ABNORMAL HIGH (ref 70–99)
Potassium: 3.6 mmol/L (ref 3.5–5.1)
Sodium: 138 mmol/L (ref 135–145)

## 2014-12-01 LAB — CBC WITH DIFFERENTIAL/PLATELET
Basophils Absolute: 0 10*3/uL (ref 0.0–0.1)
Basophils Relative: 0 % (ref 0–1)
Eosinophils Absolute: 0.1 10*3/uL (ref 0.0–0.7)
Eosinophils Relative: 2 % (ref 0–5)
HCT: 39.4 % (ref 36.0–46.0)
Hemoglobin: 13.1 g/dL (ref 12.0–15.0)
Lymphocytes Relative: 21 % (ref 12–46)
Lymphs Abs: 1.6 10*3/uL (ref 0.7–4.0)
MCH: 29 pg (ref 26.0–34.0)
MCHC: 33.2 g/dL (ref 30.0–36.0)
MCV: 87.2 fL (ref 78.0–100.0)
Monocytes Absolute: 1 10*3/uL (ref 0.1–1.0)
Monocytes Relative: 14 % — ABNORMAL HIGH (ref 3–12)
Neutro Abs: 4.9 10*3/uL (ref 1.7–7.7)
Neutrophils Relative %: 63 % (ref 43–77)
Platelets: 236 10*3/uL (ref 150–400)
RBC: 4.52 MIL/uL (ref 3.87–5.11)
RDW: 14.3 % (ref 11.5–15.5)
WBC: 7.6 10*3/uL (ref 4.0–10.5)

## 2014-12-01 MED ORDER — DIPHENHYDRAMINE HCL 50 MG/ML IJ SOLN
25.0000 mg | Freq: Once | INTRAMUSCULAR | Status: AC
  Administered 2014-12-01: 25 mg via INTRAVENOUS
  Filled 2014-12-01 (×2): qty 1

## 2014-12-01 MED ORDER — CEFUROXIME AXETIL 500 MG PO TABS: 500.0000 mg | ORAL_TABLET | Freq: Two times a day (BID) | ORAL | Status: DC

## 2014-12-01 MED ORDER — SODIUM CHLORIDE 0.9 % IV BOLUS (SEPSIS)
1000.0000 mL | Freq: Once | INTRAVENOUS | Status: AC
  Administered 2014-12-01: 1000 mL via INTRAVENOUS

## 2014-12-01 MED ORDER — ONDANSETRON HCL 4 MG/2ML IJ SOLN
4.0000 mg | Freq: Once | INTRAMUSCULAR | Status: AC
  Administered 2014-12-01: 4 mg via INTRAVENOUS
  Filled 2014-12-01 (×2): qty 2

## 2014-12-01 MED ORDER — MORPHINE SULFATE 4 MG/ML IJ SOLN
4.0000 mg | INTRAMUSCULAR | Status: DC | PRN
  Administered 2014-12-01: 4 mg via INTRAVENOUS
  Filled 2014-12-01: qty 1

## 2014-12-01 MED ORDER — ONDANSETRON HCL 4 MG/2ML IJ SOLN
4.0000 mg | Freq: Once | INTRAMUSCULAR | Status: AC
  Administered 2014-12-01: 4 mg via INTRAVENOUS
  Filled 2014-12-01: qty 2

## 2014-12-01 MED ORDER — LORAZEPAM 2 MG/ML IJ SOLN
1.0000 mg | INTRAMUSCULAR | Status: DC | PRN
  Administered 2014-12-01: 1 mg via INTRAVENOUS
  Filled 2014-12-01: qty 1

## 2014-12-01 NOTE — Discharge Instructions (Signed)
Stop Levaquin  Continue Macrobid.  Recheck with your physician.  Stay hydrated.

## 2014-12-01 NOTE — Telephone Encounter (Signed)
I agree it would be helpful to see the culture report on her urine. Maybe then we could pick one antibiotic to cover both problems. I will wait for this fax to come in

## 2014-12-01 NOTE — Telephone Encounter (Signed)
I spoke with pt and sent script e-scribe. 

## 2014-12-01 NOTE — Telephone Encounter (Signed)
I spoke with pt and we are waiting on results from urine culture.

## 2014-12-01 NOTE — ED Notes (Signed)
Called Dr. Jeneen Rinks to report patient's migraine.

## 2014-12-01 NOTE — Telephone Encounter (Signed)
Pt has appt scheduled for 2.5.2016.

## 2014-12-01 NOTE — Telephone Encounter (Signed)
We got the urine C and S today and the E coli are sensitive to every antibiotic tested. Call in Ceftin 500 mg bid for 10 days. This should treat both the UTI and pneumonia together

## 2014-12-01 NOTE — Progress Notes (Signed)
   Subjective:    Patient ID: Victoria Mosley, female    DOB: 07-14-1951, 64 y.o.   MRN: [id number]  HPI    Review of Systems     Objective:   Physical Exam        Assessment & Plan:

## 2014-12-01 NOTE — ED Provider Notes (Signed)
CSN: [id number]     Arrival date & time 12/01/14  0313 History  This chart was scribed for Tanna Furry, MD by Randa Evens, ED Scribe. This patient was seen in room A04C/A04C and the patient's care was started at 3:18 AM.      Chief Complaint  Patient presents with  . Medication Reaction   The history is provided by the patient. No language interpreter was used.   HPI Comments: Victoria Mosley is a 64 y.o. female who presents to the Emergency Department complaining of medication reaction onset 1 day prior. Pt states she feels "tingling and hot all over." Pt states she also has associated nausea and vomiting. Pt states she has tried benadryl with no relief. Pt states she is on several new medications including Levaquin, Hydromet, and nitrofurantoin. Pt states she is on these medication for bronchitis and a "touch of pneumonia." Pt states she was recently diagnosed with UTI as well. Pt doesn't report any other symptoms.   Past Medical History  Diagnosis Date  . Menopause     sees Dr. Phineas Real  . 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 Migraines   . GERD (gastroesophageal reflux disease)   . IBS (irritable bowel syndrome)   . Arthritis   . Hyperlipidemia   . Diverticulitis   . UTI (urinary tract infection)     started atb on 03-24-12  . Bilateral knee pain     sees Dr. Wynelle Link   . Kidney stones     bilateral  . Nephrolithiasis     left  . Normal cardiac stress test 2012  . History of renal calculi   . Bilateral renal cysts   . Neurogenic bladder   . History of melanoma excision   . Urinary urgency   . 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 Cough 11/30/14     cough since 11/20/14- now productine- green with green mucus out of nose- no fever  . Chronic UTI (urinary tract infection)    Past Surgical History  Procedure Laterality Date  . Melanoma excision  2007    left upper arm per dr. Teressa Senter  . Foot surgery Bilateral 1987    per Dr.  Alphonzo Cruise  . Nasal septum surgery  1992    per Dr. Cynda Familia  . Lithotripsy  2007    x 3  . Lumbar laminectomy  1993    1993, at L5 per Dr. Quentin Cornwall  . Colonoscopy  03-28-12    per Dr. Henrene Pastor, polyp,  repeat in 5 yrs  . Cesarean section    . Tubal ligation    . Kidney stone surgery  2005    per Dr. Reece Agar  . Total hip arthroplasty Right 2010    [place]  . Posterior cervical laminectomy  07/08/2012    Procedure: POSTERIOR CERVICAL LAMINECTOMY;  Surgeon: Kristeen Miss, MD;  Location: [facility];  Service: Neurosurgery;  Laterality: N/A;  Cervical Seven- thoracic One Laminectomy and Decompression of Epidural Hematoma  . Hematoma evacuation  07/08/2012    Procedure: EVACUATION HEMATOMA;  Surgeon: Kristeen Miss, MD;  Location: [facility];  Service: Neurosurgery;  Laterality: N/A;  Cervical Seven-thoracic One Laminectomy and Decompression of Epidural Hematoma  . Cystoscopy with retrograde pyelogram, ureteroscopy and stent placement Left 12/25/2012    Procedure: CYSTOSCOPY WITH RETROGRADE PYELOGRAM, URETEROSCOPY, EXTRACTION OF  LEFT STONE WITH BASKETAND STENT PLACEMENT;  Surgeon: Molli Hazard,  MD;  Location: [facility];  Service: Urology;  Laterality: Left;  2.5 HRS    Family History  Problem Relation Age of Onset  . Heart disease Mother   . Hypertension Mother   . Diabetes Mother   . Hypertension Father   . Leukemia Father   . Hypertension Sister   . Heart disease Sister   . Bladder Cancer Sister   . Stomach cancer Paternal Grandmother   . Colon cancer Neg Hx   . Esophageal cancer Neg Hx   . Rectal cancer Neg Hx    History  Substance Use Topics  . Smoking status: Former Smoker -- 0.25 packs/day    Quit date: 12/20/1998  . Smokeless tobacco: Never Used  . Alcohol Use: No   OB History    Gravida Para Term Preterm AB TAB SAB Ectopic Multiple Living   1 1 1       1       Review of Systems  Constitutional: Negative for fever, chills, diaphoresis, appetite  change and fatigue.  HENT: Negative for mouth sores, sore throat and trouble swallowing.   Eyes: Negative for visual disturbance.  Respiratory: Negative for cough, chest tightness, shortness of breath and wheezing.   Cardiovascular: Negative for chest pain.  Gastrointestinal: Positive for nausea and vomiting. Negative for abdominal pain, diarrhea and abdominal distention.  Endocrine: Negative for polydipsia, polyphagia and polyuria.  Genitourinary: Negative for dysuria, frequency and hematuria.  Musculoskeletal: Negative for gait problem.  Skin: Negative for color change, pallor and rash.  Neurological: Negative for dizziness, syncope, light-headedness and headaches.  Hematological: Does not bruise/bleed easily.  Psychiatric/Behavioral: Negative for behavioral problems and confusion.     Allergies  Pravastatin sodium; Biaxin; Ciprofloxacin; Clarithromycin; and Levaquin  Home Medications   Prior to Admission medications   Medication Sig Start Date End Date Taking? Authorizing Provider  B Complex Vitamins (VITAMIN B COMPLEX PO) Take 1 each by mouth daily.     Yes Historical Provider, MD  betamethasone valerate lotion (VALISONE) 0.1 % Apply topically 2 (two) times daily. 07/28/14  Yes Laurey Morale, MD  Calcium Citrate-Vitamin D (CALCIUM CITRATE + PO) Take 1 tablet by mouth daily.   Yes Historical Provider, MD  diphenhydrAMINE (BENADRYL) 25 mg capsule Take 25 mg by mouth at bedtime.     Yes Historical Provider, MD  estropipate (OGEN) 0.75 MG tablet Take 1 tablet (0.75 mg total) by mouth daily. Patient taking differently: Take 0.75 mg by mouth daily with supper.  01/29/14  Yes [name], MD  fish oil-omega-3 fatty acids 1000 MG capsule Take 2 g by mouth daily.   Yes Historical Provider, MD  Flax OIL Take 1 capsule by mouth 2 (two) times daily.    Yes Historical Provider, MD  hydrochlorothiazide (HYDRODIURIL) 25 MG tablet Take 1 tablet (25 mg total) by mouth daily. 07/28/14  Yes  Laurey Morale, MD  HYDROcodone-homatropine (HYDROMET) 5-1.5 MG/5ML syrup Take 5 mLs by mouth every 4 (four) hours as needed. 11/30/14  Yes Laurey Morale, MD  loratadine (CLARITIN) 10 MG tablet Take 10 mg by mouth daily. For allergies   Yes Historical Provider, MD  medroxyPROGESTERone (PROVERA) 2.5 MG tablet Take 1 tablet (2.5 mg total) by mouth daily. Patient taking differently: Take 2.5 mg by mouth daily with supper.  01/29/14  Yes [name], MD  Multiple Vitamins-Minerals (ONE-A-DAY WOMENS 50 PLUS PO) Take 1 each by mouth daily.     Yes Historical Provider, MD  Naproxen  Sodium (ALEVE) 220 MG CAPS Take 2 capsules by mouth every morning and 1 capsule by mouth every evening.   Yes Historical Provider, MD  omeprazole (PRILOSEC) 20 MG capsule TAKE ONE CAPSULE BY MOUTH ONCE DAILY 11/02/14  Yes Laurey Morale, MD  polyethylene glycol (MIRALAX / GLYCOLAX) packet Take 17 g by mouth daily. States takes 1/3 packet at bedtime   Yes Historical Provider, MD  potassium gluconate 595 MG TABS Take 1,190 mg by mouth daily.    Yes Historical Provider, MD  propranolol (INDERAL) 40 MG tablet TAKE ONE TABLET BY MOUTH DAILY. 07/28/14  Yes Laurey Morale, MD  vitamin C (ASCORBIC ACID) 500 MG tablet Take 1,000 mg by mouth daily.    Yes Historical Provider, MD  benzoyl peroxide 5 % gel Apply topically daily. Patient not taking: Reported on 11/24/2014 07/28/14   Laurey Morale, MD  Butalbital-APAP-Caffeine 50-300-40 MG CAPS Take 1 capsule by mouth 2 (two) times daily. Patient not taking: Reported on 12/01/2014 09/04/14   Laurey Morale, MD  hyoscyamine (LEVSIN SL) 0.125 MG SL tablet Place 1 tab on the tongue to dissolve at the at the onset of cramping, spasms then every 4 hours as needed. Patient not taking: Reported on 12/01/2014 07/28/14   Laurey Morale, MD  levofloxacin (LEVAQUIN) 500 MG tablet Take 1 tablet (500 mg total) by mouth daily. Patient not taking: Reported on 12/01/2014 11/30/14 12/10/14  Laurey Morale, MD   nitrofurantoin, macrocrystal-monohydrate, (MACROBID) 100 MG capsule Take 1 capsule (100 mg total) by mouth 2 (two) times daily. Patient not taking: Reported on 12/01/2014 07/27/14   Laurey Morale, MD  omeprazole (PRILOSEC) 20 MG capsule Take 20 mg by mouth at bedtime.     Historical Provider, MD  predniSONE (DELTASONE) 10 MG tablet Take 1 tablet (10 mg total) by mouth daily. Patient not taking: Reported on 11/30/2014 07/28/14   Laurey Morale, MD  propranolol (INDERAL) 40 MG tablet Take 40 mg by mouth at bedtime.    Historical Provider, MD  pseudoephedrine (SUDAFED) 120 MG 12 hr tablet Take 120 mg by mouth 2 (two) times daily.    Historical Provider, MD  pseudoephedrine-guaifenesin (MUCINEX D) 60-600 MG per tablet Take 1 tablet by mouth every 12 (twelve) hours.    Historical Provider, MD   BP 107/60 mmHg  Pulse 61  Temp(Src) 97.7 F (36.5 C) (Oral)  Resp 15  Ht 5\' 7"  (1.702 m)  Wt 196 lb (88.905 kg)  BMI 30.69 kg/m2  SpO2 94%   Physical Exam  Constitutional: She is oriented to person, place, and time. She appears well-developed and well-nourished. No distress.  Pressured speech and appears anxious.   HENT:  Head: Normocephalic.  Eyes: Conjunctivae are normal. Pupils are equal, round, and reactive to light. No scleral icterus.  Neck: Normal range of motion. Neck supple. No thyromegaly present.  Cardiovascular: Normal rate and regular rhythm.  Exam reveals no gallop and no friction rub.   No murmur heard. Pulmonary/Chest: Effort normal. No respiratory distress. She has no wheezes. She has rhonchi in the right lower field. She has no rales.  Abdominal: Soft. Bowel sounds are normal. She exhibits no distension. There is no tenderness. There is no rebound.  Musculoskeletal: Normal range of motion.  Neurological: She is alert and oriented to person, place, and time.  Skin: Skin is warm and dry. No rash noted.  Psychiatric: Her behavior is normal.  Nursing note and vitals reviewed.   ED  Course  Procedures (including critical care time) DIAGNOSTIC STUDIES: Oxygen Saturation is 99% on RA, normal by my interpretation.    COORDINATION OF CARE: 3:58 AM-Discussed treatment plan with pt at bedside and pt agreed to plan.     Labs Review Labs Reviewed  CBC WITH DIFFERENTIAL/PLATELET - Abnormal; Notable for the following:    Monocytes Relative 14 (*)    All other components within normal limits  BASIC METABOLIC PANEL - Abnormal; Notable for the following:    Glucose, Bld 124 (*)    GFR calc non Af Amer 88 (*)    All other components within normal limits    Imaging Review Dg Chest 2 View  11/30/2014   CLINICAL DATA:  Cough.  EXAM: CHEST  2 VIEW  COMPARISON:  07/08/2012.  FINDINGS: Mediastinum and hilar structures are normal. Mild bibasilar subsegmental atelectasis. Mild right base infiltrate cannot be excluded. No pleural effusion or pneumothorax. Heart size normal. No acute bony abnormality.  IMPRESSION: Mild bibasilar subsegmental atelectasis. Mild right base pneumonia cannot be excluded   Electronically Signed   By: Marcello Moores  Register   On: 11/30/2014 09:21     EKG Interpretation None      MDM   Final diagnoses:  Medication reaction, initial encounter    Patient with an anion concerning history. Nausea improved. No sign of true urticarial or anaphylactic reaction. I discussed with her starting a new medication for treatment of community acquired pneumonia. This is quite subtle on x-ray. However, she refuses. States "I wanted take another antibiotic after what happened to be tonight. We discussed trying an antibiotic here to ensure that she would not react to before discharging her with her prescription. She declines. States she will follow-up with her physician.  I personally performed the services described in this documentation, which was scribed in my presence. The recorded information has been reviewed and is accurate.      Tanna Furry, MD 12/01/14 [phone]

## 2014-12-01 NOTE — Telephone Encounter (Signed)
Patient Name: Victoria Mosley  DOB: 27-Nov-1950    Initial Comment Caller states she saw MD yesterday for congestion, dx bronchitis and touch of pneumonia. Had chest x-ray done yesterday in prep of knee replacement which will be Monday. Having allergic reaction to levaquin. Vomited and felt better. Woke up at 2am tingling all over, hot, nauseated, dizzy. Took benadryl, but threw it up. Was in ED for 6 hours for treatment. Has question   Nurse Assessment  Nurse: Leilani Merl, RN, Heather Date/Time (Eastern Time): 12/01/2014 9:05:37 AM  Confirm and document reason for call. If symptomatic, describe symptoms. ---Caller states she saw MD yesterday for congestion, dx bronchitis and touch of pneumonia. She is also on macrobid for a UTI. Had chest x-ray done yesterday in prep of knee replacement which will be Monday. Having allergic reaction to levaquin. Vomited and felt better. Woke up at 2am tingling all over, hot, nauseated, dizzy. Took benadryl, but threw it up. Was in ED for 6 hours for treatment. She was given fluids, pain meds, nausea medication. She is wanting one antibiotic to take that will take care of the pneumonia and the UTI. She was seen by Dr. Kenton Kingfisher, the urologist for the UTI, he did do a UA C&S.  Has the patient traveled out of the country within the last 30 days? ---Not Applicable  Does the patient require triage? ---No  Please document clinical information provided and list any resource used. ---Caller informed to get Dr. Kenton Kingfisher to fax over the UA C&S to Dr. Barbie Banner office, so he will know what bacteria is in the urine. Caller states that she will do that, office fax number given. Caller states that her ortho doc is calling her back right now, she will have to call the office back later.     Guidelines    Guideline Title Affirmed Question Affirmed Notes       Final Disposition User        Comments  Called the office backline, [phone] and gave information to Donna.

## 2014-12-01 NOTE — ED Notes (Signed)
Dr. James at the bedside.  

## 2014-12-01 NOTE — ED Notes (Signed)
Reported nausea and anxiety to Dr. Jeneen Rinks.

## 2014-12-01 NOTE — ED Notes (Signed)
Dr. Jeneen Rinks is at the bedside.

## 2014-12-01 NOTE — ED Notes (Signed)
Patient reports having a "weird sensation" after taking medications for bronchitis and UTI: levaquin, hydromet, and nitrofurantoin. She reports nausea and vomiting twice, hot tingling sensation, and dry mouth. She reports she took benadryl at home also.

## 2014-12-02 NOTE — H&P (Addendum)
TOTAL KNEE ADMISSION H&P  Patient is being admitted for right total knee arthroplasty.  Subjective:  Chief Complaint:right knee pain.  HPI: Victoria Mosley, 64 y.o. female, has a history of pain and functional disability in the right knee due to arthritis and has failed non-surgical conservative treatments for greater than 12 weeks to includeNSAID's and/or analgesics, corticosteriod injections and activity modification.  Onset of symptoms was gradual, starting 4 years ago with gradually worsening course since that time. The patient noted no past surgery on the right knee(s).  Patient currently rates pain in the right knee(s) at 7 out of 10 with activity. Patient has night pain, worsening of pain with activity and weight bearing, pain that interferes with activities of daily living, pain with passive range of motion, crepitus and joint swelling.  Patient has evidence of periarticular osteophytes and joint space narrowing by imaging studies. There is no active infection.  Patient Active Problem List   Diagnosis Date Noted  . 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 Migraine with aura, intractable 08/06/2012  . Overactive bladder 07/31/2012  . Traumatic epidural hematoma 07/12/2012  . IBS (irritable bowel syndrome) 07/26/2011  . OSTEOARTHRITIS 01/26/2010  . NEPHROLITHIASIS, HX OF 01/26/2010  . DIVERTICULITIS, COLON 11/02/2008  . Hyperlipidemia 02/25/2008  . COMMON MIGRAINE 10/17/2007  . ALLERGIC RHINITIS 06/19/2007  . GERD 06/19/2007   Past Medical History  Diagnosis Date  . Menopause     sees Dr. Phineas Real  . 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 Migraines   . GERD (gastroesophageal reflux disease)   . IBS (irritable bowel syndrome)   . Arthritis   . Hyperlipidemia   . Diverticulitis   . UTI (urinary tract infection)     started atb on 03-24-12  . Bilateral knee pain     sees Dr. Wynelle Link   .  Kidney stones     bilateral  . Nephrolithiasis     left  . Normal cardiac stress test 2012  . History of renal calculi   . Bilateral renal cysts   . Neurogenic bladder   . History of melanoma excision   . Urinary urgency   . 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 Cough 11/30/14     cough since 11/20/14- now productine- green with green mucus out of nose- no fever  . Chronic UTI (urinary tract infection)     Past Surgical History  Procedure Laterality Date  . Melanoma excision  2007    left upper arm per dr. Teressa Senter  . Foot surgery Bilateral 1987    per Dr. Alphonzo Cruise  . Nasal septum surgery  1992    per Dr. Cynda Familia  . Lithotripsy  2007    x 3  . Lumbar laminectomy  1993    1993, at L5 per Dr. Quentin Cornwall  . Colonoscopy  03-28-12    per Dr. Henrene Pastor, polyp,  repeat in 5 yrs  . Cesarean section    . Tubal ligation    . Kidney stone surgery  2005    per Dr. Reece Agar  . Total hip arthroplasty Right 2010    [place]  . Posterior cervical laminectomy  07/08/2012    Procedure: POSTERIOR CERVICAL LAMINECTOMY;  Surgeon: Kristeen Miss, MD;  Location: [facility];  Service: Neurosurgery;  Laterality: N/A;  Cervical Seven- thoracic One Laminectomy and Decompression of Epidural Hematoma  .  Hematoma evacuation  07/08/2012    Procedure: EVACUATION HEMATOMA;  Surgeon: Kristeen Miss, MD;  Location: [facility];  Service: Neurosurgery;  Laterality: N/A;  Cervical Seven-thoracic One Laminectomy and Decompression of Epidural Hematoma  . Cystoscopy with retrograde pyelogram, ureteroscopy and stent placement Left 12/25/2012    Procedure: CYSTOSCOPY WITH RETROGRADE PYELOGRAM, URETEROSCOPY, EXTRACTION OF  LEFT STONE WITH BASKETAND STENT PLACEMENT;  Surgeon: Molli Hazard, MD;  Location: [facility];  Service: Urology;  Laterality: Left;  2.5 HRS      Current outpatient prescriptions:  .  betamethasone valerate lotion (VALISONE) 0.1 %, Apply topically 2 (two)  times daily., Disp: 60 mL, Rfl: 5 .  Butalbital-APAP-Caffeine 50-300-40 MG CAPS, Take 1 capsule by mouth 2 (two) times daily. (Patient not taking: Reported on 12/01/2014), Disp: 60 capsule, Rfl: 5 .  diphenhydrAMINE (BENADRYL) 25 mg capsule, Take 25 mg by mouth at bedtime.  , Disp: , Rfl:  .  estropipate (OGEN) 0.75 MG tablet, Take 1 tablet (0.75 mg total) by mouth daily. (Patient taking differently: Take 0.75 mg by mouth daily with supper. ), Disp: 90 tablet, Rfl: 4 .  hydrochlorothiazide (HYDRODIURIL) 25 MG tablet, Take 1 tablet (25 mg total) by mouth daily., Disp: 90 tablet, Rfl: 3 .  loratadine (CLARITIN) 10 MG tablet, Take 10 mg by mouth daily. For allergies, Disp: , Rfl:  .  medroxyPROGESTERone (PROVERA) 2.5 MG tablet, Take 1 tablet (2.5 mg total) by mouth daily. (Patient taking differently: Take 2.5 mg by mouth daily with supper. ), Disp: 90 tablet, Rfl: 4 .  omeprazole (PRILOSEC) 20 MG capsule, Take 20 mg by mouth at bedtime. , Disp: , Rfl:  .  polyethylene glycol (MIRALAX / GLYCOLAX) packet, Take 17 g by mouth daily. States takes 1/3 packet at bedtime, Disp: , Rfl:  .  predniSONE (DELTASONE) 10 MG tablet, Take 1 tablet (10 mg total) by mouth daily. (Patient not taking: Reported on 11/30/2014), Disp: 90 tablet, Rfl: 3 .  propranolol (INDERAL) 40 MG tablet, Take 40 mg by mouth at bedtime., Disp: , Rfl:  .  pseudoephedrine (SUDAFED) 120 MG 12 hr tablet, Take 120 mg by mouth 2 (two) times daily., Disp: , Rfl:  .  pseudoephedrine-guaifenesin (MUCINEX D) 60-600 MG per tablet, Take 1 tablet by mouth every 12 (twelve) hours., Disp: , Rfl:  .  B Complex Vitamins (VITAMIN B COMPLEX PO), Take 1 each by mouth daily.  , Disp: , Rfl:  .  benzoyl peroxide 5 % gel, Apply topically daily. (Patient not taking: Reported on 11/24/2014), Disp: 60 g, Rfl: 5 .  Calcium Citrate-Vitamin D (CALCIUM CITRATE + PO), Take 1 tablet by mouth daily., Disp: , Rfl:  .  cefUROXime (CEFTIN) 500 MG tablet, Take 1 tablet (500 mg  total) by mouth 2 (two) times daily with a meal., Disp: 20 tablet, Rfl: 0 .  fish oil-omega-3 fatty acids 1000 MG capsule, Take 2 g by mouth daily., Disp: , Rfl:  .  Flax OIL, Take 1 capsule by mouth 2 (two) times daily. , Disp: , Rfl:  .  HYDROcodone-homatropine (HYDROMET) 5-1.5 MG/5ML syrup, Take 5 mLs by mouth every 4 (four) hours as needed., Disp: 240 mL, Rfl: 0 .  levofloxacin (LEVAQUIN) 500 MG tablet, Take 1 tablet (500 mg total) by mouth daily. (Patient not taking: Reported on 12/01/2014), Disp: 10 tablet, Rfl: 0 .  Multiple Vitamins-Minerals (ONE-A-DAY WOMENS 50 PLUS PO), Take 1 each by mouth daily.  , Disp: , Rfl:  .  Naproxen  Sodium (ALEVE) 220 MG CAPS, Take 2 capsules by mouth every morning and 1 capsule by mouth every evening., Disp: , Rfl:  .  omeprazole (PRILOSEC) 20 MG capsule, TAKE ONE CAPSULE BY MOUTH ONCE DAILY, Disp: 90 capsule, Rfl: 0 .  potassium gluconate 595 MG TABS, Take 1,190 mg by mouth daily. , Disp: , Rfl:  .  propranolol (INDERAL) 40 MG tablet, TAKE ONE TABLET BY MOUTH DAILY., Disp: 90 tablet, Rfl: 3 .  vitamin C (ASCORBIC ACID) 500 MG tablet, Take 1,000 mg by mouth daily. , Disp: , Rfl:   Allergies  Allergen Reactions  . Pravastatin Sodium [Pravachol]     Joint pain(severe)  . Biaxin [Clarithromycin] Nausea And Vomiting  . Ciprofloxacin Nausea Only  . Clarithromycin Nausea Only  . Levaquin [Levofloxacin In D5w] Other (See Comments)    Hot, dizziness, diarrhea    History  Substance Use Topics  . Smoking status: Former Smoker -- 0.25 packs/day    Quit date: 12/20/1998  . Smokeless tobacco: Never Used  . Alcohol Use: No    Family History  Problem Relation Age of Onset  . Heart disease Mother   . Hypertension Mother   . Diabetes Mother   . Hypertension Father   . Leukemia Father   . Hypertension Sister   . Heart disease Sister   . Bladder Cancer Sister   . Stomach cancer Paternal Grandmother   . Colon cancer Neg Hx   . Esophageal cancer Neg Hx   .  Rectal cancer Neg Hx      Review of Systems  Constitutional: Positive for malaise/fatigue. Negative for fever, chills, weight loss and diaphoresis.  HENT: Positive for hearing loss and tinnitus. Negative for congestion, ear discharge, ear pain, nosebleeds and sore throat.   Eyes: Negative.   Respiratory: Negative.  Negative for stridor.   Cardiovascular: Negative.   Gastrointestinal: Positive for heartburn and constipation. Negative for nausea, vomiting, abdominal pain, diarrhea, blood in stool and melena.  Genitourinary: Negative.   Musculoskeletal: Positive for myalgias, back pain and joint pain. Negative for falls and neck pain.  Skin: Negative.   Neurological: Positive for weakness and headaches. Negative for dizziness, tingling, tremors, sensory change, speech change, focal weakness, seizures and loss of consciousness.  Endo/Heme/Allergies: Positive for environmental allergies. Negative for polydipsia. Does not bruise/bleed easily.  Psychiatric/Behavioral: Negative.     Objective:  Physical Exam  Constitutional: She is oriented to person, place, and time. She appears well-developed. No distress.  Overweight  HENT:  Head: Normocephalic and atraumatic.  Right Ear: External ear normal.  Left Ear: External ear normal.  Nose: Nose normal.  Mouth/Throat: Oropharynx is clear and moist.  Eyes: Conjunctivae and EOM are normal.  Neck: Normal range of motion. Neck supple.  Cardiovascular: Normal rate, regular rhythm, normal heart sounds and intact distal pulses.   No murmur heard. Respiratory: Effort normal and breath sounds normal. No respiratory distress. She has no wheezes.  GI: Soft. Bowel sounds are normal. She exhibits no distension. There is no tenderness.  Musculoskeletal:       Right hip: Normal.       Left hip: Normal.       Right knee: She exhibits decreased range of motion and swelling. She exhibits no effusion and no erythema. Tenderness found. Medial joint line and  lateral joint line tenderness noted.       Left knee: Normal.       Right lower leg: She exhibits no tenderness and no swelling.  Left lower leg: She exhibits no tenderness and no swelling.  Evaluation of her right hip, flexion 120, rotation in 30, out 30, abduction 40 without discomfort. Her right knee with no effusion. There is marked crepitus on range of motion in the knee. She is tender along the medial joint line. There is no lateral tenderness or instability  Neurological: She is alert and oriented to person, place, and time. She has normal strength and normal reflexes. No sensory deficit.  Skin: No rash noted. She is not diaphoretic. No erythema.  Psychiatric: She has a normal mood and affect. Her behavior is normal.    Vitals  Weight: 190 lb Height: 67in Body Surface Area: 1.98 m Body Mass Index: 29.76 kg/m  Pulse: 76 (Regular)  BP: 128/72 (Sitting, Left Arm, Standard)  Imaging Review Plain radiographs demonstrate severe degenerative joint disease of the right knee(s). The overall alignment ismild varus. The bone quality appears to be good for age and reported activity level.  Assessment/Plan:  End stage arthritis, right knee (Primary Osteoarthritis)  The patient history, physical examination, clinical judgment of the provider and imaging studies are consistent with end stage degenerative joint disease of the right knee(s) and total knee arthroplasty is deemed medically necessary. The treatment options including medical management, injection therapy arthroscopy and arthroplasty were discussed at length. The risks and benefits of total knee arthroplasty were presented and reviewed. The risks due to aseptic loosening, infection, stiffness, patella tracking problems, thromboembolic complications and other imponderables were discussed. The patient acknowledged the explanation, agreed to proceed with the plan and consent was signed. Patient is being admitted for inpatient  treatment for surgery, pain control, PT, OT, prophylactic antibiotics, VTE prophylaxis, progressive ambulation and ADL's and discharge planning. The patient is planning to be discharged home with home health services   Wants general anesthesia Wants CPM for home use TXA IV  PCP: Dr. Alysia Penna   Ardeen Jourdain, PA-C

## 2014-12-04 ENCOUNTER — Ambulatory Visit (INDEPENDENT_AMBULATORY_CARE_PROVIDER_SITE_OTHER): Payer: 59 | Admitting: Family Medicine

## 2014-12-04 ENCOUNTER — Encounter: Payer: Self-pay | Admitting: Family Medicine

## 2014-12-04 VITALS — BP 118/78 | HR 75 | Temp 98.5°F

## 2014-12-04 DIAGNOSIS — J181 Lobar pneumonia, unspecified organism: Principal | ICD-10-CM

## 2014-12-04 DIAGNOSIS — N3000 Acute cystitis without hematuria: Secondary | ICD-10-CM

## 2014-12-04 DIAGNOSIS — J189 Pneumonia, unspecified organism: Secondary | ICD-10-CM

## 2014-12-04 NOTE — Progress Notes (Signed)
Pre visit review using our clinic review tool, if applicable. No additional management support is needed unless otherwise documented below in the visit note. 

## 2014-12-04 NOTE — Progress Notes (Signed)
   Subjective:    Patient ID: Victoria Mosley, female    DOB: 1951/10/08, 64 y.o.   MRN: [id number]  HPI Here to follow up a RLL pneumonia. After an allergic reaction to Levaquin led to an ER visit on 12-01-14, we switched her to Ceftin and she has done quite well since then. The cough and fever are gone, and she feels more energy. She actually went back to work yesterday.    Review of Systems  Constitutional: Negative.   Respiratory: Negative.   Cardiovascular: Negative.   Genitourinary: Negative.        Objective:   Physical Exam  Constitutional: She appears well-developed and well-nourished.  Neck: No thyromegaly present.  Pulmonary/Chest: Effort normal and breath sounds normal. No respiratory distress. She has no wheezes. She has no rales.  Lymphadenopathy:    She has no cervical adenopathy.          Assessment & Plan:  She is recovering well from pneumonia and a UTI. Finish out the [employer]. She is cleared for her knee surgery on Monday as planned.

## 2014-12-07 ENCOUNTER — Inpatient Hospital Stay (HOSPITAL_COMMUNITY): Payer: 59 | Admitting: Certified Registered Nurse Anesthetist

## 2014-12-07 ENCOUNTER — Inpatient Hospital Stay (HOSPITAL_COMMUNITY)
Admission: RE | Admit: 2014-12-07 | Discharge: 2014-12-09 | DRG: 470 | Disposition: A | Discharge status: Home / Self Care | Payer: 59 | Source: Ambulatory Visit | Attending: Orthopedic Surgery | Admitting: Orthopedic Surgery

## 2014-12-07 ENCOUNTER — Encounter (HOSPITAL_COMMUNITY): Payer: Self-pay | Admitting: *Deleted

## 2014-12-07 ENCOUNTER — Encounter (HOSPITAL_COMMUNITY)
Admission: RE | Disposition: A | Discharge status: Home / Self Care | Payer: Self-pay | Source: Ambulatory Visit | Attending: Orthopedic Surgery

## 2014-12-07 DIAGNOSIS — Z96641 Presence of right artificial hip joint: Secondary | ICD-10-CM | POA: Diagnosis present

## 2014-12-07 DIAGNOSIS — G43119 Migraine with aura, intractable, without status migrainosus: Secondary | ICD-10-CM | POA: Diagnosis present

## 2014-12-07 DIAGNOSIS — Z87891 Personal history of nicotine dependence: Secondary | ICD-10-CM

## 2014-12-07 DIAGNOSIS — Z87442 Personal history of urinary calculi: Secondary | ICD-10-CM | POA: Diagnosis not present

## 2014-12-07 DIAGNOSIS — Z87440 Personal history of urinary (tract) infections: Secondary | ICD-10-CM

## 2014-12-07 DIAGNOSIS — M1710 Unilateral primary osteoarthritis, unspecified knee: Secondary | ICD-10-CM | POA: Diagnosis present

## 2014-12-07 DIAGNOSIS — K219 Gastro-esophageal reflux disease without esophagitis: Secondary | ICD-10-CM | POA: Diagnosis present

## 2014-12-07 DIAGNOSIS — N319 Neuromuscular dysfunction of bladder, unspecified: Secondary | ICD-10-CM | POA: Diagnosis present

## 2014-12-07 DIAGNOSIS — M25561 Pain in right knee: Secondary | ICD-10-CM | POA: Diagnosis present

## 2014-12-07 DIAGNOSIS — G8381 Brown-Sequard syndrome: Secondary | ICD-10-CM | POA: Diagnosis present

## 2014-12-07 DIAGNOSIS — Z881 Allergy status to other antibiotic agents status: Secondary | ICD-10-CM | POA: Diagnosis not present

## 2014-12-07 DIAGNOSIS — E785 Hyperlipidemia, unspecified: Secondary | ICD-10-CM | POA: Diagnosis present

## 2014-12-07 DIAGNOSIS — K589 Irritable bowel syndrome without diarrhea: Secondary | ICD-10-CM | POA: Diagnosis present

## 2014-12-07 DIAGNOSIS — M1711 Unilateral primary osteoarthritis, right knee: Secondary | ICD-10-CM | POA: Diagnosis present

## 2014-12-07 DIAGNOSIS — L709 Acne, unspecified: Secondary | ICD-10-CM | POA: Diagnosis present

## 2014-12-07 DIAGNOSIS — N3281 Overactive bladder: Secondary | ICD-10-CM | POA: Diagnosis present

## 2014-12-07 DIAGNOSIS — M179 Osteoarthritis of knee, unspecified: Secondary | ICD-10-CM | POA: Diagnosis present

## 2014-12-07 DIAGNOSIS — Z7982 Long term (current) use of aspirin: Secondary | ICD-10-CM | POA: Diagnosis not present

## 2014-12-07 DIAGNOSIS — Z85820 Personal history of malignant melanoma of skin: Secondary | ICD-10-CM

## 2014-12-07 HISTORY — PX: TOTAL KNEE ARTHROPLASTY: SHX125

## 2014-12-07 LAB — TYPE AND SCREEN
ABO/RH(D): O POS
Antibody Screen: NEGATIVE

## 2014-12-07 SURGERY — ARTHROPLASTY, KNEE, TOTAL
Anesthesia: Monitor Anesthesia Care | Site: Knee | Laterality: Right

## 2014-12-07 MED ORDER — PHENYLEPHRINE 40 MCG/ML (10ML) SYRINGE FOR IV PUSH (FOR BLOOD PRESSURE SUPPORT)
PREFILLED_SYRINGE | INTRAVENOUS | Status: AC
  Filled 2014-12-07: qty 10

## 2014-12-07 MED ORDER — KETOROLAC TROMETHAMINE 15 MG/ML IJ SOLN
INTRAMUSCULAR | Status: AC
  Filled 2014-12-07: qty 1

## 2014-12-07 MED ORDER — SODIUM CHLORIDE 0.9 % IV SOLN
INTRAVENOUS | Status: DC
Start: 2014-12-07 — End: 2014-12-07

## 2014-12-07 MED ORDER — PROMETHAZINE HCL 25 MG/ML IJ SOLN
INTRAMUSCULAR | Status: AC
  Filled 2014-12-07: qty 1

## 2014-12-07 MED ORDER — METHOCARBAMOL 500 MG PO TABS
500.0000 mg | ORAL_TABLET | Freq: Four times a day (QID) | ORAL | Status: DC | PRN
  Administered 2014-12-08: 500 mg via ORAL
  Filled 2014-12-07: qty 1

## 2014-12-07 MED ORDER — GLYCOPYRROLATE 0.2 MG/ML IJ SOLN
INTRAMUSCULAR | Status: AC
  Filled 2014-12-07: qty 1

## 2014-12-07 MED ORDER — CHLORHEXIDINE GLUCONATE 4 % EX LIQD: 60.0000 mL | Freq: Once | CUTANEOUS | Status: DC

## 2014-12-07 MED ORDER — POTASSIUM GLUCONATE 595 (99 K) MG PO TABS
1190.0000 mg | ORAL_TABLET | Freq: Every day | ORAL | Status: DC
  Administered 2014-12-08 – 2014-12-09 (×2): 1190 mg via ORAL
  Filled 2014-12-07 (×2): qty 2

## 2014-12-07 MED ORDER — MENTHOL 3 MG MT LOZG
1.0000 | LOZENGE | OROMUCOSAL | Status: DC | PRN
  Filled 2014-12-07: qty 9

## 2014-12-07 MED ORDER — ACETAMINOPHEN 650 MG RE SUPP: 650.0000 mg | Freq: Four times a day (QID) | RECTAL | Status: DC | PRN

## 2014-12-07 MED ORDER — PHENYLEPHRINE HCL 10 MG/ML IJ SOLN
INTRAMUSCULAR | Status: DC | PRN
  Administered 2014-12-07 (×3): 40 ug via INTRAVENOUS
  Administered 2014-12-07: 80 ug via INTRAVENOUS

## 2014-12-07 MED ORDER — HYDROCHLOROTHIAZIDE 25 MG PO TABS
25.0000 mg | ORAL_TABLET | Freq: Every day | ORAL | Status: DC
  Administered 2014-12-07 – 2014-12-09 (×3): 25 mg via ORAL
  Filled 2014-12-07 (×3): qty 1

## 2014-12-07 MED ORDER — MIDAZOLAM HCL 2 MG/2ML IJ SOLN
INTRAMUSCULAR | Status: AC
  Filled 2014-12-07: qty 2

## 2014-12-07 MED ORDER — PROPOFOL 10 MG/ML IV BOLUS
INTRAVENOUS | Status: AC
  Filled 2014-12-07: qty 20

## 2014-12-07 MED ORDER — HYDROMORPHONE HCL 2 MG/ML IJ SOLN
INTRAMUSCULAR | Status: AC
  Filled 2014-12-07: qty 1

## 2014-12-07 MED ORDER — POTASSIUM CHLORIDE IN NACL 20-0.9 MEQ/L-% IV SOLN
INTRAVENOUS | Status: AC
  Filled 2014-12-07: qty 1000

## 2014-12-07 MED ORDER — HYOSCYAMINE SULFATE 0.125 MG SL SUBL
0.1250 mg | SUBLINGUAL_TABLET | SUBLINGUAL | Status: DC | PRN
  Filled 2014-12-07: qty 1

## 2014-12-07 MED ORDER — POTASSIUM CHLORIDE IN NACL 20-0.9 MEQ/L-% IV SOLN
INTRAVENOUS | Status: DC
  Administered 2014-12-07: 23:00:00 via INTRAVENOUS
  Filled 2014-12-07 (×5): qty 1000

## 2014-12-07 MED ORDER — KETOROLAC TROMETHAMINE 15 MG/ML IJ SOLN
7.5000 mg | Freq: Four times a day (QID) | INTRAMUSCULAR | Status: AC | PRN
  Administered 2014-12-07: 7.5 mg via INTRAVENOUS
  Filled 2014-12-07: qty 1

## 2014-12-07 MED ORDER — SUCCINYLCHOLINE CHLORIDE 20 MG/ML IJ SOLN
INTRAMUSCULAR | Status: DC | PRN
  Administered 2014-12-07: 100 mg via INTRAVENOUS

## 2014-12-07 MED ORDER — HYDROMORPHONE HCL 1 MG/ML IJ SOLN
INTRAMUSCULAR | Status: AC
  Filled 2014-12-07: qty 2

## 2014-12-07 MED ORDER — CEFUROXIME AXETIL 500 MG PO TABS
500.0000 mg | ORAL_TABLET | Freq: Two times a day (BID) | ORAL | Status: DC
  Administered 2014-12-07 – 2014-12-09 (×4): 500 mg via ORAL
  Filled 2014-12-07 (×7): qty 1

## 2014-12-07 MED ORDER — PROPRANOLOL HCL 40 MG PO TABS
40.0000 mg | ORAL_TABLET | Freq: Every day | ORAL | Status: DC
  Administered 2014-12-08: 40 mg via ORAL
  Filled 2014-12-07 (×3): qty 1

## 2014-12-07 MED ORDER — LIDOCAINE HCL (CARDIAC) 20 MG/ML IV SOLN
INTRAVENOUS | Status: DC | PRN
  Administered 2014-12-07: 100 mg via INTRAVENOUS

## 2014-12-07 MED ORDER — LACTATED RINGERS IV SOLN
INTRAVENOUS | Status: DC
  Administered 2014-12-07: 09:00:00 via INTRAVENOUS

## 2014-12-07 MED ORDER — FENTANYL CITRATE 0.05 MG/ML IJ SOLN
INTRAMUSCULAR | Status: DC | PRN
  Administered 2014-12-07 (×5): 50 ug via INTRAVENOUS

## 2014-12-07 MED ORDER — DIPHENHYDRAMINE HCL 12.5 MG/5ML PO ELIX: 12.5000 mg | ORAL_SOLUTION | ORAL | Status: DC | PRN

## 2014-12-07 MED ORDER — NEOSTIGMINE METHYLSULFATE 10 MG/10ML IV SOLN
INTRAVENOUS | Status: AC
Start: 2014-12-07 — End: 2014-12-07
  Filled 2014-12-07: qty 1

## 2014-12-07 MED ORDER — STERILE WATER FOR IRRIGATION IR SOLN
Status: DC | PRN
  Administered 2014-12-07: 1500 mL

## 2014-12-07 MED ORDER — RIVAROXABAN 10 MG PO TABS
10.0000 mg | ORAL_TABLET | Freq: Every day | ORAL | Status: DC
  Administered 2014-12-08 – 2014-12-09 (×2): 10 mg via ORAL
  Filled 2014-12-07 (×4): qty 1

## 2014-12-07 MED ORDER — ONDANSETRON HCL 4 MG PO TABS: 4.0000 mg | ORAL_TABLET | Freq: Four times a day (QID) | ORAL | Status: DC | PRN

## 2014-12-07 MED ORDER — BUTALBITAL-APAP-CAFFEINE 50-325-40 MG PO TABS
1.0000 | ORAL_TABLET | ORAL | Status: DC | PRN
  Administered 2014-12-08: 1 via ORAL
  Administered 2014-12-09: 2 via ORAL
  Filled 2014-12-07: qty 2
  Filled 2014-12-07: qty 1

## 2014-12-07 MED ORDER — ACETAMINOPHEN 325 MG PO TABS: 650.0000 mg | ORAL_TABLET | Freq: Four times a day (QID) | ORAL | Status: DC | PRN

## 2014-12-07 MED ORDER — TRAMADOL HCL 50 MG PO TABS
50.0000 mg | ORAL_TABLET | Freq: Four times a day (QID) | ORAL | Status: DC | PRN
  Administered 2014-12-07 – 2014-12-08 (×3): 100 mg via ORAL
  Filled 2014-12-07 (×4): qty 2

## 2014-12-07 MED ORDER — 0.9 % SODIUM CHLORIDE (POUR BTL) OPTIME
TOPICAL | Status: DC | PRN
  Administered 2014-12-07: 1000 mL

## 2014-12-07 MED ORDER — METOCLOPRAMIDE HCL 10 MG PO TABS: 5.0000 mg | ORAL_TABLET | Freq: Three times a day (TID) | ORAL | Status: DC | PRN

## 2014-12-07 MED ORDER — SODIUM CHLORIDE 0.9 % IJ SOLN
INTRAMUSCULAR | Status: DC | PRN
  Administered 2014-12-07: 30 mL

## 2014-12-07 MED ORDER — DEXAMETHASONE SODIUM PHOSPHATE 10 MG/ML IJ SOLN
10.0000 mg | Freq: Once | INTRAMUSCULAR | Status: AC
  Administered 2014-12-08: 10 mg via INTRAVENOUS
  Filled 2014-12-07: qty 1

## 2014-12-07 MED ORDER — MIDAZOLAM HCL 5 MG/5ML IJ SOLN
INTRAMUSCULAR | Status: DC | PRN
  Administered 2014-12-07: 2 mg via INTRAVENOUS

## 2014-12-07 MED ORDER — ATROPINE SULFATE 0.4 MG/ML IJ SOLN
INTRAMUSCULAR | Status: AC
  Filled 2014-12-07: qty 1

## 2014-12-07 MED ORDER — LIDOCAINE HCL (CARDIAC) 20 MG/ML IV SOLN
INTRAVENOUS | Status: AC
  Filled 2014-12-07: qty 5

## 2014-12-07 MED ORDER — BUPIVACAINE LIPOSOME 1.3 % IJ SUSP
20.0000 mL | Freq: Once | INTRAMUSCULAR | Status: DC
  Filled 2014-12-07: qty 20

## 2014-12-07 MED ORDER — METOCLOPRAMIDE HCL 5 MG/ML IJ SOLN: 5.0000 mg | Freq: Three times a day (TID) | INTRAMUSCULAR | Status: DC | PRN

## 2014-12-07 MED ORDER — TRANEXAMIC ACID 100 MG/ML IV SOLN
1000.0000 mg | INTRAVENOUS | Status: AC
  Administered 2014-12-07: 1000 mg via INTRAVENOUS
  Filled 2014-12-07: qty 10

## 2014-12-07 MED ORDER — GUAIFENESIN ER 600 MG PO TB12: 600.0000 mg | ORAL_TABLET | Freq: Two times a day (BID) | ORAL | Status: DC | PRN

## 2014-12-07 MED ORDER — SCOPOLAMINE 1 MG/3DAYS TD PT72
MEDICATED_PATCH | TRANSDERMAL | Status: DC | PRN
  Administered 2014-12-07: 1 via TRANSDERMAL

## 2014-12-07 MED ORDER — DOCUSATE SODIUM 100 MG PO CAPS
100.0000 mg | ORAL_CAPSULE | Freq: Two times a day (BID) | ORAL | Status: DC
  Administered 2014-12-07 – 2014-12-09 (×4): 100 mg via ORAL

## 2014-12-07 MED ORDER — SODIUM CHLORIDE 0.9 % IJ SOLN
INTRAMUSCULAR | Status: AC
  Filled 2014-12-07: qty 50

## 2014-12-07 MED ORDER — GLYCOPYRROLATE 0.2 MG/ML IJ SOLN
INTRAMUSCULAR | Status: DC | PRN
  Administered 2014-12-07: .8 mg via INTRAVENOUS

## 2014-12-07 MED ORDER — LORATADINE 10 MG PO TABS
10.0000 mg | ORAL_TABLET | Freq: Every day | ORAL | Status: DC
  Administered 2014-12-08 – 2014-12-09 (×2): 10 mg via ORAL
  Filled 2014-12-07 (×2): qty 1

## 2014-12-07 MED ORDER — ACETAMINOPHEN 500 MG PO TABS
1000.0000 mg | ORAL_TABLET | Freq: Four times a day (QID) | ORAL | Status: AC
  Administered 2014-12-07 – 2014-12-08 (×4): 1000 mg via ORAL
  Filled 2014-12-07 (×4): qty 2

## 2014-12-07 MED ORDER — MORPHINE SULFATE 2 MG/ML IJ SOLN
1.0000 mg | INTRAMUSCULAR | Status: DC | PRN
  Administered 2014-12-07 – 2014-12-08 (×5): 2 mg via INTRAVENOUS
  Administered 2014-12-08: 1 mg via INTRAVENOUS
  Filled 2014-12-07 (×6): qty 1

## 2014-12-07 MED ORDER — ROCURONIUM BROMIDE 100 MG/10ML IV SOLN
INTRAVENOUS | Status: AC
  Filled 2014-12-07: qty 1

## 2014-12-07 MED ORDER — PANTOPRAZOLE SODIUM 40 MG PO TBEC
40.0000 mg | DELAYED_RELEASE_TABLET | Freq: Every day | ORAL | Status: DC
  Administered 2014-12-07: 40 mg via ORAL
  Filled 2014-12-07 (×2): qty 1

## 2014-12-07 MED ORDER — PREDNISONE 20 MG PO TABS
20.0000 mg | ORAL_TABLET | Freq: Once | ORAL | Status: AC
  Administered 2014-12-07: 20 mg via ORAL
  Filled 2014-12-07: qty 1

## 2014-12-07 MED ORDER — CEFAZOLIN SODIUM-DEXTROSE 2-3 GM-% IV SOLR
INTRAVENOUS | Status: AC
  Filled 2014-12-07: qty 50

## 2014-12-07 MED ORDER — OXYCODONE HCL 5 MG PO TABS
5.0000 mg | ORAL_TABLET | ORAL | Status: DC | PRN
  Administered 2014-12-08: 10 mg via ORAL
  Filled 2014-12-07: qty 2

## 2014-12-07 MED ORDER — PSEUDOEPHEDRINE-GUAIFENESIN ER 60-600 MG PO TB12: 1.0000 | ORAL_TABLET | Freq: Two times a day (BID) | ORAL | Status: DC

## 2014-12-07 MED ORDER — BISACODYL 10 MG RE SUPP: 10.0000 mg | Freq: Every day | RECTAL | Status: DC | PRN

## 2014-12-07 MED ORDER — POLYETHYLENE GLYCOL 3350 17 G PO PACK: 17.0000 g | PACK | Freq: Every day | ORAL | Status: DC | PRN

## 2014-12-07 MED ORDER — NEOSTIGMINE METHYLSULFATE 10 MG/10ML IV SOLN
INTRAVENOUS | Status: DC | PRN
  Administered 2014-12-07: 4 mg via INTRAVENOUS

## 2014-12-07 MED ORDER — FENTANYL CITRATE 0.05 MG/ML IJ SOLN
INTRAMUSCULAR | Status: AC
  Filled 2014-12-07: qty 5

## 2014-12-07 MED ORDER — ACETAMINOPHEN 10 MG/ML IV SOLN
1000.0000 mg | Freq: Once | INTRAVENOUS | Status: AC
  Administered 2014-12-07: 1000 mg via INTRAVENOUS
  Filled 2014-12-07: qty 100

## 2014-12-07 MED ORDER — PREDNISONE 10 MG PO TABS
10.0000 mg | ORAL_TABLET | Freq: Every day | ORAL | Status: DC
  Filled 2014-12-07: qty 1

## 2014-12-07 MED ORDER — PROMETHAZINE HCL 25 MG/ML IJ SOLN
6.2500 mg | INTRAMUSCULAR | Status: AC | PRN
  Administered 2014-12-07 (×2): 12.5 mg via INTRAVENOUS

## 2014-12-07 MED ORDER — SODIUM CHLORIDE 0.9 % IJ SOLN
INTRAMUSCULAR | Status: AC
Start: 2014-12-07 — End: 2014-12-07
  Administered 2014-12-07: 10 mL
  Filled 2014-12-07: qty 10

## 2014-12-07 MED ORDER — BUPIVACAINE HCL 0.25 % IJ SOLN
INTRAMUSCULAR | Status: DC | PRN
  Administered 2014-12-07: 30 mL

## 2014-12-07 MED ORDER — GLYCOPYRROLATE 0.2 MG/ML IJ SOLN
INTRAMUSCULAR | Status: AC
  Filled 2014-12-07: qty 3

## 2014-12-07 MED ORDER — ONDANSETRON HCL 4 MG/2ML IJ SOLN
INTRAMUSCULAR | Status: AC
  Filled 2014-12-07: qty 2

## 2014-12-07 MED ORDER — CEFAZOLIN SODIUM-DEXTROSE 2-3 GM-% IV SOLR
2.0000 g | INTRAVENOUS | Status: AC
  Administered 2014-12-07: 2 g via INTRAVENOUS

## 2014-12-07 MED ORDER — SODIUM CHLORIDE 0.9 % IJ SOLN
INTRAMUSCULAR | Status: AC
  Filled 2014-12-07: qty 10

## 2014-12-07 MED ORDER — PREDNISONE 20 MG PO TABS
20.0000 mg | ORAL_TABLET | Freq: Two times a day (BID) | ORAL | Status: AC
  Administered 2014-12-08 (×2): 20 mg via ORAL
  Filled 2014-12-07 (×2): qty 1

## 2014-12-07 MED ORDER — PREDNISONE 10 MG PO TABS
10.0000 mg | ORAL_TABLET | Freq: Two times a day (BID) | ORAL | Status: DC
  Administered 2014-12-09: 10 mg via ORAL
  Filled 2014-12-07 (×3): qty 1

## 2014-12-07 MED ORDER — OXYCODONE HCL 5 MG PO TABS: 5.0000 mg | ORAL_TABLET | Freq: Once | ORAL | Status: DC | PRN

## 2014-12-07 MED ORDER — SODIUM CHLORIDE 0.9 % IR SOLN
Status: DC | PRN
  Administered 2014-12-07: 1000 mL

## 2014-12-07 MED ORDER — CEFAZOLIN SODIUM-DEXTROSE 2-3 GM-% IV SOLR
2.0000 g | Freq: Four times a day (QID) | INTRAVENOUS | Status: AC
  Administered 2014-12-07 (×2): 2 g via INTRAVENOUS
  Filled 2014-12-07 (×2): qty 50

## 2014-12-07 MED ORDER — DEXTROSE 5 % IV SOLN
500.0000 mg | Freq: Four times a day (QID) | INTRAVENOUS | Status: DC | PRN
  Administered 2014-12-07: 500 mg via INTRAVENOUS
  Filled 2014-12-07 (×2): qty 5

## 2014-12-07 MED ORDER — ROCURONIUM BROMIDE 100 MG/10ML IV SOLN
INTRAVENOUS | Status: DC | PRN
  Administered 2014-12-07: 40 mg via INTRAVENOUS

## 2014-12-07 MED ORDER — SCOPOLAMINE 1 MG/3DAYS TD PT72
MEDICATED_PATCH | TRANSDERMAL | Status: AC
  Filled 2014-12-07: qty 1

## 2014-12-07 MED ORDER — BUPIVACAINE LIPOSOME 1.3 % IJ SUSP
INTRAMUSCULAR | Status: DC | PRN
  Administered 2014-12-07: 20 mL

## 2014-12-07 MED ORDER — DEXAMETHASONE SODIUM PHOSPHATE 10 MG/ML IJ SOLN
INTRAMUSCULAR | Status: AC
  Filled 2014-12-07: qty 1

## 2014-12-07 MED ORDER — OXYCODONE HCL 5 MG/5ML PO SOLN
5.0000 mg | Freq: Once | ORAL | Status: DC | PRN
  Filled 2014-12-07: qty 5

## 2014-12-07 MED ORDER — FLEET ENEMA 7-19 GM/118ML RE ENEM: 1.0000 | ENEMA | Freq: Once | RECTAL | Status: AC | PRN

## 2014-12-07 MED ORDER — PHENOL 1.4 % MT LIQD: 1.0000 | OROMUCOSAL | Status: DC | PRN

## 2014-12-07 MED ORDER — HYDROMORPHONE HCL 1 MG/ML IJ SOLN
INTRAMUSCULAR | Status: DC | PRN
  Administered 2014-12-07 (×4): 0.5 mg via INTRAVENOUS

## 2014-12-07 MED ORDER — LACTATED RINGERS IV SOLN
INTRAVENOUS | Status: DC | PRN
  Administered 2014-12-07 (×2): via INTRAVENOUS

## 2014-12-07 MED ORDER — DEXAMETHASONE SODIUM PHOSPHATE 10 MG/ML IJ SOLN
10.0000 mg | Freq: Once | INTRAMUSCULAR | Status: AC
  Administered 2014-12-07: 10 mg via INTRAVENOUS

## 2014-12-07 MED ORDER — EPHEDRINE SULFATE 50 MG/ML IJ SOLN
INTRAMUSCULAR | Status: AC
  Filled 2014-12-07: qty 1

## 2014-12-07 MED ORDER — ONDANSETRON HCL 4 MG/2ML IJ SOLN
4.0000 mg | Freq: Four times a day (QID) | INTRAMUSCULAR | Status: DC | PRN
  Administered 2014-12-07 – 2014-12-09 (×3): 4 mg via INTRAVENOUS
  Filled 2014-12-07 (×3): qty 2

## 2014-12-07 MED ORDER — BUPIVACAINE HCL (PF) 0.25 % IJ SOLN
INTRAMUSCULAR | Status: AC
  Filled 2014-12-07: qty 30

## 2014-12-07 MED ORDER — ONDANSETRON HCL 4 MG/2ML IJ SOLN
INTRAMUSCULAR | Status: DC | PRN
  Administered 2014-12-07: 4 mg via INTRAVENOUS

## 2014-12-07 MED ORDER — HYDROMORPHONE HCL 1 MG/ML IJ SOLN
0.2500 mg | INTRAMUSCULAR | Status: DC | PRN
  Administered 2014-12-07 (×4): 0.5 mg via INTRAVENOUS

## 2014-12-07 MED ORDER — PSEUDOEPHEDRINE HCL ER 120 MG PO TB12
120.0000 mg | ORAL_TABLET | Freq: Two times a day (BID) | ORAL | Status: DC | PRN
  Filled 2014-12-07: qty 1

## 2014-12-07 MED ORDER — PROPOFOL 10 MG/ML IV BOLUS
INTRAVENOUS | Status: DC | PRN
  Administered 2014-12-07: 180 mg via INTRAVENOUS

## 2014-12-07 SURGICAL SUPPLY — 60 items
BAG ZIPLOCK 12X15 (MISCELLANEOUS) ×2 IMPLANT
BANDAGE ELASTIC 6 VELCRO ST LF (GAUZE/BANDAGES/DRESSINGS) ×2 IMPLANT
BANDAGE ESMARK 6X9 LF (GAUZE/BANDAGES/DRESSINGS) ×1 IMPLANT
BLADE SAG 18X100X1.27 (BLADE) ×2 IMPLANT
BLADE SAW SGTL 11.0X1.19X90.0M (BLADE) ×2 IMPLANT
BNDG CONFORM 6X.82 1P STRL (GAUZE/BANDAGES/DRESSINGS) ×2 IMPLANT
BNDG ESMARK 6X9 LF (GAUZE/BANDAGES/DRESSINGS) ×2
BOWL SMART MIX CTS (DISPOSABLE) ×2 IMPLANT
CAPT KNEE TOTAL 3 ATTUNE ×2 IMPLANT
CEMENT HV SMART SET (Cement) ×2 IMPLANT
CUFF TOURN SGL QUICK 34 (TOURNIQUET CUFF) ×1
CUFF TRNQT CYL 34X4X40X1 (TOURNIQUET CUFF) ×1 IMPLANT
DECANTER SPIKE VIAL GLASS SM (MISCELLANEOUS) ×2 IMPLANT
DRAPE EXTREMITY T 121X128X90 (DRAPE) ×2 IMPLANT
DRAPE POUCH INSTRU U-SHP 10X18 (DRAPES) ×2 IMPLANT
DRAPE U-SHAPE 47X51 STRL (DRAPES) ×2 IMPLANT
DRSG ADAPTIC 3X8 NADH LF (GAUZE/BANDAGES/DRESSINGS) ×2 IMPLANT
DRSG PAD ABDOMINAL 8X10 ST (GAUZE/BANDAGES/DRESSINGS) ×2 IMPLANT
DURAPREP 26ML APPLICATOR (WOUND CARE) ×2 IMPLANT
ELECT REM PT RETURN 9FT ADLT (ELECTROSURGICAL) ×2
ELECTRODE REM PT RTRN 9FT ADLT (ELECTROSURGICAL) ×1 IMPLANT
EVACUATOR 1/8 PVC DRAIN (DRAIN) ×2 IMPLANT
FACESHIELD WRAPAROUND (MASK) ×10 IMPLANT
GAUZE SPONGE 4X4 12PLY STRL (GAUZE/BANDAGES/DRESSINGS) ×2 IMPLANT
GLOVE BIO SURGEON STRL SZ7.5 (GLOVE) IMPLANT
GLOVE BIO SURGEON STRL SZ8 (GLOVE) ×2 IMPLANT
GLOVE BIOGEL PI IND STRL 6.5 (GLOVE) IMPLANT
GLOVE BIOGEL PI IND STRL 8 (GLOVE) ×1 IMPLANT
GLOVE BIOGEL PI INDICATOR 6.5 (GLOVE)
GLOVE BIOGEL PI INDICATOR 8 (GLOVE) ×1
GLOVE SURG SS PI 6.5 STRL IVOR (GLOVE) IMPLANT
GOWN STRL REUS W/TWL LRG LVL3 (GOWN DISPOSABLE) ×2 IMPLANT
GOWN STRL REUS W/TWL XL LVL3 (GOWN DISPOSABLE) IMPLANT
HANDPIECE INTERPULSE COAX TIP (DISPOSABLE) ×1
IMMOBILIZER KNEE 20 (SOFTGOODS) ×4 IMPLANT
IMMOBILIZER KNEE 20 THIGH 36 (SOFTGOODS) ×1 IMPLANT
KIT BASIN OR (CUSTOM PROCEDURE TRAY) ×2 IMPLANT
MANIFOLD NEPTUNE II (INSTRUMENTS) ×2 IMPLANT
NDL SAFETY ECLIPSE 18X1.5 (NEEDLE) ×2 IMPLANT
NEEDLE HYPO 18GX1.5 SHARP (NEEDLE) ×2
NS IRRIG 1000ML POUR BTL (IV SOLUTION) ×2 IMPLANT
PACK TOTAL JOINT (CUSTOM PROCEDURE TRAY) ×2 IMPLANT
PAD ABD 8X10 STRL (GAUZE/BANDAGES/DRESSINGS) ×2 IMPLANT
PADDING CAST COTTON 6X4 STRL (CAST SUPPLIES) ×6 IMPLANT
POSITIONER SURGICAL ARM (MISCELLANEOUS) ×2 IMPLANT
SET HNDPC FAN SPRY TIP SCT (DISPOSABLE) ×1 IMPLANT
STRIP CLOSURE SKIN 1/2X4 (GAUZE/BANDAGES/DRESSINGS) ×4 IMPLANT
SUCTION FRAZIER 12FR DISP (SUCTIONS) ×2 IMPLANT
SUT MNCRL AB 4-0 PS2 18 (SUTURE) ×2 IMPLANT
SUT VIC AB 2-0 CT1 27 (SUTURE) ×3
SUT VIC AB 2-0 CT1 TAPERPNT 27 (SUTURE) ×3 IMPLANT
SUT VLOC 180 0 24IN GS25 (SUTURE) ×2 IMPLANT
SYR 20CC LL (SYRINGE) ×2 IMPLANT
SYR 50ML LL SCALE MARK (SYRINGE) ×2 IMPLANT
TAPE STRIPS DRAPE STRL (GAUZE/BANDAGES/DRESSINGS) ×2 IMPLANT
TOWEL OR 17X26 10 PK STRL BLUE (TOWEL DISPOSABLE) ×2 IMPLANT
TOWEL OR NON WOVEN STRL DISP B (DISPOSABLE) IMPLANT
TRAY FOLEY CATH 14FRSI W/METER (CATHETERS) ×2 IMPLANT
WATER STERILE IRR 1500ML POUR (IV SOLUTION) ×2 IMPLANT
WRAP KNEE MAXI GEL POST OP (GAUZE/BANDAGES/DRESSINGS) ×2 IMPLANT

## 2014-12-07 NOTE — Op Note (Signed)
Pre-operative diagnosis- Osteoarthritis  Right knee(s)  Post-operative diagnosis- Osteoarthritis Right knee(s)  Procedure-  Right  Total Knee Arthroplasty  Surgeon- Dione Plover. Selina Tapper, MD  Assistant- Ardeen Jourdain, PA-C   Anesthesia-  Spinal  EBL-* No blood loss amount entered *   Drains Hemovac  Tourniquet time-  Total Tourniquet Time Documented: Thigh (Right) - 33 minutes Total: Thigh (Right) - 33 minutes     Complications- None  Condition-PACU - hemodynamically stable.   Brief Clinical Note  Victoria Mosley is a 64 y.o. year old female with end stage OA of her right knee with progressively worsening pain and dysfunction. She has constant pain, with activity and at rest and significant functional deficits with difficulties even with ADLs. She has had extensive non-op management including analgesics, injections of cortisone and viscosupplements, and home exercise program, but remains in significant pain with significant dysfunction.Radiographs show bone on bone arthritis patellofemoral. She presents now for right Total Knee Arthroplasty.    Procedure in detail---   The patient is brought into the operating room and positioned supine on the operating table. After successful administration of  Spinal,   a tourniquet is placed high on the  Right thigh(s) and the lower extremity is prepped and draped in the usual sterile fashion. Time out is performed by the operating team and then the  Right lower extremity is wrapped in Esmarch, knee flexed and the tourniquet inflated to 300 mmHg.       A midline incision is made with a ten blade through the subcutaneous tissue to the level of the extensor mechanism. A fresh blade is used to make a medial parapatellar arthrotomy. Soft tissue over the proximal medial tibia is subperiosteally elevated to the joint line with a knife and into the semimembranosus bursa with a Cobb elevator. Soft tissue over the proximal lateral tibia is elevated with  attention being paid to avoiding the patellar tendon on the tibial tubercle. The patella is everted, knee flexed 90 degrees and the ACL and PCL are removed. Findings are bone on bone patellofemoral with exposed bone laterally and large global osteophytes.        The drill is used to create a starting hole in the distal femur and the canal is thoroughly irrigated with sterile saline to remove the fatty contents. The 5 degree Right  valgus alignment guide is placed into the femoral canal and the distal femoral cutting block is pinned to remove 9 mm off the distal femur. Resection is made with an oscillating saw.      The tibia is subluxed forward and the menisci are removed. The extramedullary alignment guide is placed referencing proximally at the medial aspect of the tibial tubercle and distally along the second metatarsal axis and tibial crest. The block is pinned to remove 43mm off the more deficient medial  side. Resection is made with an oscillating saw. Size 5is the most appropriate size for the tibia and the proximal tibia is prepared with the modular drill and keel punch for that size.      The femoral sizing guide is placed and size 6 is most appropriate. Rotation is marked off the epicondylar axis and confirmed by creating a rectangular flexion gap at 90 degrees. The size 6 cutting block is pinned in this rotation and the anterior, posterior and chamfer cuts are made with the oscillating saw. The intercondylar block is then placed and that cut is made.      Trial size 5 tibial component, trial  size 6 posterior stabilized femur and a 6  mm posterior stabilized rotating platform insert trial is placed. Full extension is achieved with excellent varus/valgus and anterior/posterior balance throughout full range of motion. The patella is everted and thickness measured to be 22  mm. Free hand resection is taken to 12 mm, a 35 template is placed, lug holes are drilled, trial patella is placed, and it tracks  normally. Osteophytes are removed off the posterior femur with the trial in place. All trials are removed and the cut bone surfaces prepared with pulsatile lavage. Cement is mixed and once ready for implantation, the size 5 tibial implant, size  6 narrow posterior stabilized femoral component, and the size 35 patella are cemented in place and the patella is held with the clamp. The trial insert is placed and the knee held in full extension. The Exparel (20 ml mixed with 30 ml saline) and .25% Bupivicaine, are injected into the extensor mechanism, posterior capsule, medial and lateral gutters and subcutaneous tissues.  All extruded cement is removed and once the cement is hard the permanent 6 mm posterior stabilized rotating platform insert is placed into the tibial tray.      The wound is copiously irrigated with saline solution and the extensor mechanism closed over a hemovac drain with #1 V-loc suture. The tourniquet is released for a total tourniquet time of 33  minutes. Flexion against gravity is 140 degrees and the patella tracks normally. Subcutaneous tissue is closed with 2.0 vicryl and subcuticular with running 4.0 Monocryl. The incision is cleaned and dried and steri-strips and a bulky sterile dressing are applied. The limb is placed into a knee immobilizer and the patient is awakened and transported to recovery in stable condition.      Please note that a surgical assistant was a medical necessity for this procedure in order to perform it in a safe and expeditious manner. Surgical assistant was necessary to retract the ligaments and vital neurovascular structures to prevent injury to them and also necessary for proper positioning of the limb to allow for anatomic placement of the prosthesis.   Dione Plover Sritha Chauncey, MD    12/07/2014, 8:11 AM

## 2014-12-07 NOTE — Anesthesia Postprocedure Evaluation (Signed)
Anesthesia Post Note  Patient: Victoria Mosley  Procedure(s) Performed: Procedure(s) (LRB): RIGHT TOTAL KNEE ARTHROPLASTY (Right)  Anesthesia type: general  Patient location: PACU  Post pain: Pain level controlled  Post assessment: Patient's Cardiovascular Status Stable  Last Vitals:  Filed Vitals:   12/07/14 0930  BP: 133/74  Pulse: 62  Temp: 36.7 C  Resp: 12    Post vital signs: Reviewed and stable  Level of consciousness: sedated  Complications: No apparent anesthesia complications

## 2014-12-07 NOTE — Transfer of Care (Signed)
Immediate Anesthesia Transfer of Care Note  Patient: Victoria Mosley  Procedure(s) Performed: Procedure(s) (LRB): RIGHT TOTAL KNEE ARTHROPLASTY (Right)  Patient Location: PACU  Anesthesia Type: General  Level of Consciousness: sedated, patient cooperative and responds to stimulation  Airway & Oxygen Therapy: Patient Spontanous Breathing and Patient connected to face mask oxgen  Post-op Assessment: Report given to PACU RN and Post -op Vital signs reviewed and stable  Post vital signs: Reviewed and stable  Complications: No apparent anesthesia complications

## 2014-12-07 NOTE — Evaluation (Signed)
Physical Therapy Evaluation Patient Details Name: Victoria Mosley MRN: [id number] DOB: Feb 03, 1951 Today's Date: 12/07/2014   History of Present Illness  s/p R TKA  Clinical Impression  Pt will benefit from PT to address deficits below;  Plan is for HHPT    Follow Up Recommendations Home health PT    Equipment Recommendations  None recommended by PT    Recommendations for Other Services       Precautions / Restrictions Precautions Precautions: Knee Required Braces or Orthoses: Knee Immobilizer - Right Knee Immobilizer - Right: Discontinue once straight leg raise with < 10 degree lag      Mobility  Bed Mobility Overal bed mobility: Needs Assistance Bed Mobility: Supine to Sit     Supine to sit: Min assist     General bed mobility comments: cues for technique and to self assist  Transfers Overall transfer level: Needs assistance Equipment used: Rolling walker (2 wheeled) Transfers: Sit to/from Stand Sit to Stand: Min assist;From elevated surface         General transfer comment: cues for hand placement and control of descent  Ambulation/Gait Ambulation/Gait assistance: Min assist Ambulation Distance (Feet): 25 Feet Assistive device: Rolling walker (2 wheeled) Gait Pattern/deviations: Step-to pattern;Antalgic     General Gait Details: cues for sequence and RW safety  Stairs            Wheelchair Mobility    Modified Rankin (Stroke Patients Only)       Balance                                             Pertinent Vitals/Pain Pain Assessment: 0-10 Pain Score: 3  Pain Location: right knee Pain Descriptors / Indicators: Sore Pain Intervention(s): Limited activity within patient's tolerance;Monitored during session;Premedicated before session;Repositioned    Home Living Family/patient expects to be discharged to:: Private residence Living Arrangements: Spouse/significant other Available Help at Discharge: Family Type of  Home: House Home Access: Stairs to enter   [employer] of Steps: one threshold step to enter Home Layout: One level Home Equipment: Cane - single point;Bedside commode;Walker - 2 wheels;Shower seat Additional Comments: reacher, lift chair    Prior Function Level of Independence: Independent               Hand Dominance        Extremity/Trunk Assessment   Upper Extremity Assessment: Defer to OT evaluation           Lower Extremity Assessment: RLE deficits/detail RLE Deficits / Details: knee extension and hip flexion 2/5; ankle WFL       Communication   Communication: No difficulties  Cognition Arousal/Alertness: Awake/alert Behavior During Therapy: WFL for tasks assessed/performed Overall Cognitive Status: Within Functional Limits for tasks assessed                      General Comments      Exercises Total Joint Exercises Ankle Circles/Pumps: AROM;Both;10 reps Quad Sets: AROM;Right;10 reps      Assessment/Plan    PT Assessment Patient needs continued PT services  PT Diagnosis Difficulty walking   PT Problem List Decreased strength;Decreased range of motion;Decreased activity tolerance;Decreased balance;Decreased knowledge of precautions;Decreased knowledge of use of DME  PT Treatment Interventions DME instruction;Gait training;Functional mobility training;Therapeutic activities;Therapeutic exercise;Patient/family education   PT Goals (Current goals can be found in the Care Plan section)  Acute Rehab PT Goals Patient Stated Goal: less pain PT Goal Formulation: With patient Time For Goal Achievement: 12/14/14 Potential to Achieve Goals: Good    Frequency 7X/week   Barriers to discharge        Co-evaluation               End of Session Equipment Utilized During Treatment: Gait belt Activity Tolerance: Patient tolerated treatment well Patient left: with call bell/phone within reach;in bed;with family/visitor  present Nurse Communication: Mobility status         Time: 1451-1533 PT Time Calculation (min) (ACUTE ONLY): 42 min   Charges:   PT Evaluation $Initial PT Evaluation Tier I: 1 Procedure PT Treatments $Gait Training: 8-22 mins $Therapeutic Activity: 8-22 mins   PT G Codes:        Kynzlie Hilleary Dec 31, 2014, 3:40 PM

## 2014-12-07 NOTE — Interval H&P Note (Signed)
History and Physical Interval Note:  12/07/2014 6:50 AM  Victoria Mosley  has presented today for surgery, with the diagnosis of OA OF RIGHT KNEE  The various methods of treatment have been discussed with the patient and family. After consideration of risks, benefits and other options for treatment, the patient has consented to  Procedure(s): RIGHT TOTAL KNEE ARTHROPLASTY (Right) as a surgical intervention .  The patient's history has been reviewed, patient examined, no change in status, stable for surgery.  I have reviewed the patient's chart and labs.  Questions were answered to the patient's satisfaction.     [name]

## 2014-12-07 NOTE — Plan of Care (Signed)
Problem: Consults Goal: Diagnosis- Total Joint Replacement Right total knee     

## 2014-12-07 NOTE — Progress Notes (Signed)
Patient started taking Levaquin for UTI. Had a reaction from Levaquin and has been on Cefatin since then.

## 2014-12-07 NOTE — Anesthesia Procedure Notes (Signed)
Procedure Name: Intubation Date/Time: 12/07/2014 7:19 AM Performed by: Montel Clock Pre-anesthesia Checklist: Patient identified, Emergency Drugs available, Suction available, Patient being monitored and Timeout performed Patient Re-evaluated:Patient Re-evaluated prior to inductionOxygen Delivery Method: Circle system utilized Preoxygenation: Pre-oxygenation with 100% oxygen Intubation Type: IV induction Ventilation: Mask ventilation without difficulty Laryngoscope Size: Mac and 3 Grade View: Grade II Tube type: Oral Tube size: 7.5 mm Number of attempts: 1 Airway Equipment and Method: Stylet Placement Confirmation: ETT inserted through vocal cords under direct vision,  positive ETCO2 and breath sounds checked- equal and bilateral Secured at: 21 cm Tube secured with: Tape Dental Injury: Teeth and Oropharynx as per pre-operative assessment

## 2014-12-07 NOTE — Anesthesia Preprocedure Evaluation (Addendum)
Anesthesia Evaluation  Patient identified by MRN, date of birth, ID band Patient awake    Reviewed: Allergy & Precautions, NPO status , Patient's Chart, lab work & pertinent test results  History of Anesthesia Complications Negative for: history of anesthetic complications  Airway Mallampati: II  TM Distance: >3 FB Neck ROM: Full    Dental  (+) Teeth Intact, Dental Advisory Given   Pulmonary former smoker,    Pulmonary exam normal       Cardiovascular negative cardio ROS      Neuro/Psych  Headaches, negative psych ROS   GI/Hepatic Neg liver ROS, GERD-  Medicated,  Endo/Other  negative endocrine ROS  Renal/GU negative Renal ROS     Musculoskeletal  (+) Arthritis -,   Abdominal   Peds  Hematology   Anesthesia Other Findings   Reproductive/Obstetrics                            Anesthesia Physical Anesthesia Plan  ASA: III  Anesthesia Plan: MAC and Spinal   Post-op Pain Management:    Induction:   Airway Management Planned: Simple Face Mask  Additional Equipment:   Intra-op Plan:   Post-operative Plan:   Informed Consent: I have reviewed the patients History and Physical, chart, labs and discussed the procedure including the risks, benefits and alternatives for the proposed anesthesia with the patient or authorized representative who has indicated his/her understanding and acceptance.   Dental advisory given  Plan Discussed with: CRNA, Anesthesiologist and Surgeon  Anesthesia Plan Comments:        Anesthesia Quick Evaluation

## 2014-12-08 ENCOUNTER — Encounter (HOSPITAL_COMMUNITY): Payer: Self-pay | Admitting: Orthopedic Surgery

## 2014-12-08 LAB — BASIC METABOLIC PANEL
Anion gap: 6 (ref 5–15)
BUN: 15 mg/dL (ref 6–23)
CO2: 28 mmol/L (ref 19–32)
Calcium: 8 mg/dL — ABNORMAL LOW (ref 8.4–10.5)
Chloride: 105 mmol/L (ref 96–112)
Creatinine, Ser: 0.77 mg/dL (ref 0.50–1.10)
GFR calc Af Amer: >90 mL/min (ref >90)
GFR calc non Af Amer: 88 mL/min — ABNORMAL LOW (ref >90)
Glucose, Bld: 152 mg/dL — ABNORMAL HIGH (ref 70–99)
Potassium: 3.6 mmol/L (ref 3.5–5.1)
Sodium: 139 mmol/L (ref 135–145)

## 2014-12-08 LAB — CBC
HCT: 32.9 % — ABNORMAL LOW (ref 36.0–46.0)
Hemoglobin: 10.6 g/dL — ABNORMAL LOW (ref 12.0–15.0)
MCH: 28.6 pg (ref 26.0–34.0)
MCHC: 32.2 g/dL (ref 30.0–36.0)
MCV: 88.9 fL (ref 78.0–100.0)
Platelets: 224 10*3/uL (ref 150–400)
RBC: 3.7 MIL/uL — ABNORMAL LOW (ref 3.87–5.11)
RDW: 14.7 % (ref 11.5–15.5)
WBC: 12.2 10*3/uL — ABNORMAL HIGH (ref 4.0–10.5)

## 2014-12-08 MED ORDER — DIAZEPAM 5 MG PO TABS: 5.0000 mg | ORAL_TABLET | Freq: Four times a day (QID) | ORAL | Status: DC | PRN

## 2014-12-08 MED ORDER — HYDROCODONE-ACETAMINOPHEN 5-325 MG PO TABS: 1.0000 | ORAL_TABLET | ORAL | Status: DC | PRN

## 2014-12-08 MED ORDER — TRAMADOL HCL 50 MG PO TABS: 50.0000 mg | ORAL_TABLET | Freq: Four times a day (QID) | ORAL | Status: DC | PRN

## 2014-12-08 MED ORDER — NON FORMULARY: 20.0000 mg | Freq: Every day | Status: DC

## 2014-12-08 MED ORDER — OMEPRAZOLE 20 MG PO CPDR
20.0000 mg | DELAYED_RELEASE_CAPSULE | Freq: Every day | ORAL | Status: DC
  Administered 2014-12-08: 20 mg via ORAL
  Filled 2014-12-08 (×2): qty 1

## 2014-12-08 MED ORDER — SODIUM CHLORIDE 0.9 % IV BOLUS (SEPSIS): 250.0000 mL | Freq: Once | INTRAVENOUS | Status: DC

## 2014-12-08 MED ORDER — RIVAROXABAN 10 MG PO TABS: 10.0000 mg | ORAL_TABLET | Freq: Every day | ORAL | Status: DC

## 2014-12-08 MED ORDER — HYDROCODONE-ACETAMINOPHEN 5-325 MG PO TABS
1.0000 | ORAL_TABLET | ORAL | Status: DC | PRN
  Administered 2014-12-08 – 2014-12-09 (×6): 2 via ORAL
  Filled 2014-12-08 (×6): qty 2

## 2014-12-08 MED ORDER — DIAZEPAM 5 MG PO TABS
5.0000 mg | ORAL_TABLET | Freq: Four times a day (QID) | ORAL | Status: DC | PRN
  Administered 2014-12-08 – 2014-12-09 (×4): 5 mg via ORAL
  Filled 2014-12-08 (×4): qty 1

## 2014-12-08 NOTE — Progress Notes (Signed)
Physical Therapy Treatment Note    12/08/14 1400  PT Visit Information  Last PT Received On 12/08/14  Assistance Needed +1  History of Present Illness Pt is a 64 year old female s/p R TKA  PT Time Calculation  PT Start Time (ACUTE ONLY) 1408  PT Stop Time (ACUTE ONLY) 1433  PT Time Calculation (min) (ACUTE ONLY) 25 min  Subjective Data  Subjective Pt assisted into bathroom then ambulated in hallway.   Precautions  Precautions Knee  Required Braces or Orthoses Knee Immobilizer - Right  Knee Immobilizer - Right Discontinue once straight leg raise with < 10 degree lag  Restrictions  RLE Weight Bearing WBAT  Pain Assessment  Pain Assessment 0-10  Pain Score 5  Pain Location R knee  Pain Descriptors / Indicators Aching;Sore  Pain Intervention(s) Limited activity within patient's tolerance;Monitored during session;Premedicated before session;Repositioned;Ice applied  Cognition  Arousal/Alertness Awake/alert  Behavior During Therapy WFL for tasks assessed/performed  Overall Cognitive Status Within Functional Limits for tasks assessed  Bed Mobility  Overal bed mobility Needs Assistance  Bed Mobility Sit to Supine  Sit to supine Min assist  General bed mobility comments pt up in recliner on arrival, assist for R LE upon return to bed  Transfers  Overall transfer level Needs assistance  Equipment used Rolling walker (2 wheeled)  Transfers Sit to/from Stand  Sit to Stand Min guard  General transfer comment verbal cues for UE and LE positioning from [facility] over toilet  Ambulation/Gait  Ambulation/Gait assistance Min guard  Ambulation Distance (Feet) 80 Feet  Assistive device Rolling walker (2 wheeled)  Gait Pattern/deviations Step-to pattern;Antalgic;Trunk flexed  General Gait Details verbal cues for sequence, RW distance, step length  PT - End of Session  Equipment Utilized During Treatment Right knee immobilizer  Activity Tolerance Patient limited by pain  Patient left in  bed;with call bell/phone within reach;with family/visitor present  PT - Assessment/Plan  PT Plan Current plan remains appropriate  PT Frequency (ACUTE ONLY) 7X/week  Follow Up Recommendations Home health PT  PT equipment None recommended by PT  PT Goal Progression  Progress towards PT goals Progressing toward goals  PT General Charges  $$ ACUTE PT VISIT 1 Procedure  PT Treatments  $Gait Training 8-22 mins   Carmelia Bake, PT, DPT 12/08/2014 Pager: [phone]

## 2014-12-08 NOTE — Progress Notes (Signed)
Physical Therapy Treatment Patient Details Name: Victoria Mosley MRN: [id number] DOB: 1951/03/27 Today's Date: 12-19-2014    History of Present Illness Pt is a 64 year old female s/p R TKA    PT Comments    Pt with increased pain earlier this morning however premedicated just prior to visit and pt reporting pain much better.  Pt able to tolerate short distance ambulation and performed LE exercises.  Follow Up Recommendations  Home health PT     Equipment Recommendations  None recommended by PT    Recommendations for Other Services       Precautions / Restrictions Precautions Precautions: Knee Required Braces or Orthoses: Knee Immobilizer - Right Knee Immobilizer - Right: Discontinue once straight leg raise with < 10 degree lag Restrictions RLE Weight Bearing: Weight bearing as tolerated    Mobility  Bed Mobility Overal bed mobility: Needs Assistance             General bed mobility comments: pt in chair on arrival  Transfers Overall transfer level: Needs assistance Equipment used: Rolling walker (2 wheeled) Transfers: Sit to/from Stand Sit to Stand: Min guard         General transfer comment: verbal cues for UE and LE positioning  Ambulation/Gait Ambulation/Gait assistance: Min guard;Min assist Ambulation Distance (Feet): 65 Feet Assistive device: Rolling walker (2 wheeled) Gait Pattern/deviations: Step-to pattern;Antalgic     General Gait Details: verbal cues for sequence, RW distance, step length   Stairs            Wheelchair Mobility    Modified Rankin (Stroke Patients Only)       Balance                                    Cognition Arousal/Alertness: Awake/alert Behavior During Therapy: WFL for tasks assessed/performed Overall Cognitive Status: Within Functional Limits for tasks assessed                      Exercises Total Joint Exercises Ankle Circles/Pumps: AROM;Both;15 reps Quad Sets: AROM;Both;15  reps Towel Squeeze: AROM;Both;15 reps Short Arc QuadSinclair Ship;Right;10 reps Heel Slides: AAROM;Right;15 reps Hip ABduction/ADduction: AAROM;Right;10 reps    General Comments        Pertinent Vitals/Pain Pain Assessment: 0-10 Pain Score: 5  Pain Location: R knee Pain Descriptors / Indicators: Aching Pain Intervention(s): Limited activity within patient's tolerance;Monitored during session;Premedicated before session;Repositioned;Ice applied    Home Living                      Prior Function            PT Goals (current goals can now be found in the care plan section) Progress towards PT goals: Progressing toward goals    Frequency  7X/week    PT Plan Current plan remains appropriate    Co-evaluation             End of Session Equipment Utilized During Treatment: Right knee immobilizer Activity Tolerance: Patient tolerated treatment well Patient left: with call bell/phone within reach;with family/visitor present;in chair     Time: 9242-6834 PT Time Calculation (min) (ACUTE ONLY): 36 min  Charges:  $Gait Training: 8-22 mins $Therapeutic Exercise: 8-22 mins                    G Codes:      Linnae Rasool,KATHrine E December 19, 2014, 1:06 PM  Carmelia Bake, PT, DPT 12/08/2014 Pager: [phone]

## 2014-12-08 NOTE — Evaluation (Signed)
Occupational Therapy Evaluation Patient Details Name: Victoria Mosley MRN: [id number] DOB: [birth year] Today's Date: 12/08/2014    History of Present Illness s/p R TKA   Clinical Impression   Pt admitted for the above diagnosis and has the deficits listed below. Pt would benefit from cont OT to increase I with basic adls and adl transfers so pt can d/c home safely with her husband.    Follow Up Recommendations  Home health OT;Supervision/Assistance - 24 hour    Equipment Recommendations  None recommended by OT    Recommendations for Other Services       Precautions / Restrictions Precautions Precautions: Knee Required Braces or Orthoses: Knee Immobilizer - Right Knee Immobilizer - Right: Discontinue once straight leg raise with < 10 degree lag Restrictions Weight Bearing Restrictions: Yes RLE Weight Bearing: Weight bearing as tolerated      Mobility Bed Mobility               General bed mobility comments: pt in chair on arrival  Transfers Overall transfer level: Needs assistance Equipment used: Rolling walker (2 wheeled) Transfers: Sit to/from [facility] Sit to Stand: Min assist;From elevated surface Stand pivot transfers: Min assist       General transfer comment: cues for hand placement and control of descent    Balance Overall balance assessment: Needs assistance         Standing balance support: Bilateral upper extremity supported;During functional activity Standing balance-Leahy Scale: Poor Standing balance comment: Currenlty, pt needs walker or outside assist to stand.                            ADL Overall ADL's : Needs assistance/impaired Eating/Feeding: Independent;Sitting   Grooming: Oral care;Brushing hair;Sitting;Set up Grooming Details (indicate cue type and reason): Pt c/o too much pain to stand and groom. Upper Body Bathing: Set up;Sitting   Lower Body Bathing: Moderate assistance;Sit to/from  stand Lower Body Bathing Details (indicate cue type and reason): mod assist to stand and reach bottom and to bathe R foot/ankle. Upper Body Dressing : Set up;Sitting   Lower Body Dressing: Moderate assistance;Sit to/from stand Lower Body Dressing Details (indicate cue type and reason): Pt has a reacher at home.  Pt is not interested in sock aid.  Husband to assist. 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 Toilet Transfer: Minimal Patent examiner Details (indicate cue type and reason): Painful to stand. Toileting- Clothing Manipulation and Hygiene: Minimal assistance;Sit to/from stand Toileting - Clothing Manipulation Details (indicate cue type and reason): min assist to maintain balance while cleaning self.     Functional mobility during ADLs: Minimal assistance;Rolling walker General ADL Comments: Pt limited with adls right now due to pain. Pt self limits some as well.  BSC adjusted and left bedside so pt would be agreeable to getting catheter out.     Vision                     Perception     Praxis      Pertinent Vitals/Pain Pain Assessment: 0-10 Pain Score: 6  Pain Location: right knee Pain Descriptors / Indicators: Aching;Throbbing Pain Intervention(s): Premedicated before session;Repositioned;Limited activity within patient's tolerance;Monitored during session;Patient requesting pain meds-RN notified;Ice applied     Hand Dominance Right   Extremity/Trunk Assessment Upper Extremity Assessment Upper Extremity Assessment: Overall WFL for tasks assessed   Lower Extremity Assessment Lower  Extremity Assessment: Defer to PT evaluation   Cervical / Trunk Assessment Cervical / Trunk Assessment:  (has had back surgery)   Communication Communication Communication: No difficulties   Cognition Arousal/Alertness: Awake/alert Behavior During Therapy: WFL for tasks assessed/performed Overall Cognitive Status: Within Functional  Limits for tasks assessed                     General Comments       Exercises       Shoulder Instructions      Home Living Family/patient expects to be discharged to:: Private residence Living Arrangements: Spouse/significant other Available Help at Discharge: Family Type of Home: House Home Access: Stairs to enter [employer] of Steps: one threshold step to enter   Home Layout: One level     Bathroom Shower/Tub: Tub/shower unit;Curtain Shower/tub characteristics: Architectural technologist: Standard Bathroom Accessibility: Yes How Accessible: Accessible via walker Home Equipment: [place] - single point;Bedside commode;Walker - 2 wheels;Tub bench   Additional Comments: reacher, lift chair      Prior Functioning/Environment Level of Independence: Independent             OT Diagnosis: Generalized weakness;Acute pain   OT Problem List: Decreased strength;Decreased activity tolerance;Impaired balance (sitting and/or standing);Pain   OT Treatment/Interventions:      OT Goals(Current goals can be found in the care plan section) Acute Rehab OT Goals Patient Stated Goal: less pain OT Goal Formulation: With patient/family Time For Goal Achievement: 12/15/14 Potential to Achieve Goals: Good ADL Goals Pt Will Perform Lower Body Bathing: with min assist;sit to/from stand;with caregiver independent in assisting Pt Will Perform Lower Body Dressing: with min assist;with caregiver independent in assisting;sit to/from stand Pt Will Perform Tub/Shower Transfer: Tub transfer;tub bench;rolling walker;with min assist Additional ADL Goal #1: Pt will complete all aspects of toileting with 3:1 over commode with S.  OT Frequency: Min 2X/week   Barriers to D/C:            Co-evaluation              End of Session Equipment Utilized During Treatment: Rolling walker;Right knee immobilizer Nurse Communication: Mobility status;Other (comment) (cathter emptied  for 1000ccs.  Nursing notified.)  Activity Tolerance: Patient limited by pain Patient left: in chair;with call bell/phone within reach   Time: 0926-0951 OT Time Calculation (min): 25 min Charges:  OT General Charges $OT Visit: 1 Procedure OT Evaluation $Initial OT Evaluation Tier I: 1 Procedure OT Treatments $Self Care/Home Management : 8-22 mins G-Codes:    Glenford Peers 01-01-15, 10:09 AM  [phone]

## 2014-12-08 NOTE — Care Management Note (Addendum)
    Page 1 of 1   12/08/2014     12:54:12 PM CARE MANAGEMENT NOTE 12/08/2014  Patient:  Victoria Mosley, Victoria Mosley   Account Number:  [id number]  Date Initiated:  12/08/2014  Documentation initiated by:  [facility]  Subjective/Objective Assessment:   adm: RIGHT TOTAL KNEE ARTHROPLASTY (Right)     Action/Plan:   discharge planning   Anticipated DC Date:  12/09/2014   Anticipated DC Plan:  [place]  CM consult      [facility] Choice  HOME HEALTH   Choice offered to / List presented to:  C-1 Patient        [place] arranged  HH-2 PT      [place]   Status of service:  Completed, signed off Medicare Important Message given?   (If response is "NO", the following Medicare IM given date fields will be blank) Date Medicare IM given:   Medicare IM given by:   Date Additional Medicare IM given:   Additional Medicare IM given by:    Discharge Disposition:  [place]  Per UR Regulation:    If discussed at Long Length of Stay Meetings, dates discussed:    Comments:  12/08/14 12:45 CM met with pt in room to offer choice of home health agency. Pt chooses [facility] to render HHPT.  Address and contact information verified by pt. No DME is needed as pt has a 3n1, rolling walker and shower seat at home. Referral emailed to [employer]. No other CM needs were communicated.  Mariane Masters, BSn, [place].

## 2014-12-08 NOTE — Discharge Summary (Signed)
Physician Discharge Summary   Patient ID: Victoria Mosley MRN: [id number] DOB/AGE: 1950/11/16 64 y.o.  Admit date: 12/07/2014 Discharge date: 12/09/2014  Primary Diagnosis:  Osteoarthritis Right knee(s)  Admission Diagnoses:  Past Medical History  Diagnosis Date  . Menopause     sees Dr. Phineas Real  . 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 Migraines   . GERD (gastroesophageal reflux disease)   . IBS (irritable bowel syndrome)   . Arthritis   . Hyperlipidemia   . Diverticulitis   . UTI (urinary tract infection)     started atb on 03-24-12  . Bilateral knee pain     sees Dr. Wynelle Link   . Kidney stones     bilateral  . Nephrolithiasis     left  . Normal cardiac stress test 2012  . History of renal calculi   . Bilateral renal cysts   . Neurogenic bladder   . History of melanoma excision   . Urinary urgency   . 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 Cough 11/30/14     cough since 11/20/14- now productine- green with green mucus out of nose- no fever  . Chronic UTI (urinary tract infection)    Discharge Diagnoses:   Principal Problem:   OA (osteoarthritis) of knee  Estimated body mass index is 30.69 kg/(m^2) as calculated from the following:   Height as of this encounter: $RemoveBeforeD'5\' 7"'mIaneumaxdhrgo$  (1.702 m).   Weight as of this encounter: 88.905 kg (196 lb).  Procedure:  Procedure(s) (LRB): RIGHT TOTAL KNEE ARTHROPLASTY (Right)   Consults: None  HPI: Victoria Mosley is a 64 y.o. year old female with end stage OA of her right knee with progressively worsening pain and dysfunction. She has constant pain, with activity and at rest and significant functional deficits with difficulties even with ADLs. She has had extensive non-op management including analgesics, injections of cortisone and viscosupplements, and home exercise program, but remains in significant pain with significant dysfunction.Radiographs show bone on bone arthritis patellofemoral. She  presents now for right Total Knee Arthroplasty.   Laboratory Data: Admission on 12/07/2014, Discharged on 12/09/2014  Component Date Value Ref Range Status  . WBC 12/08/2014 12.2* 4.0 - 10.5 K/uL Final  . RBC 12/08/2014 3.70* 3.87 - 5.11 MIL/uL Final  . Hemoglobin 12/08/2014 10.6* 12.0 - 15.0 g/dL Final  . HCT 12/08/2014 32.9* 36.0 - 46.0 % Final  . MCV 12/08/2014 88.9  78.0 - 100.0 fL Final  . MCH 12/08/2014 28.6  26.0 - 34.0 pg Final  . MCHC 12/08/2014 32.2  30.0 - 36.0 g/dL Final  . RDW 12/08/2014 14.7  11.5 - 15.5 % Final  . Platelets 12/08/2014 224  150 - 400 K/uL Final  . Sodium 12/08/2014 139  135 - 145 mmol/L Final  . Potassium 12/08/2014 3.6  3.5 - 5.1 mmol/L Final  . Chloride 12/08/2014 105  96 - 112 mmol/L Final  . CO2 12/08/2014 28  19 - 32 mmol/L Final  . Glucose, Bld 12/08/2014 152* 70 - 99 mg/dL Final  . BUN 12/08/2014 15  6 - 23 mg/dL Final  . Creatinine, Ser 12/08/2014 0.77  0.50 - 1.10 mg/dL Final  . Calcium 12/08/2014 8.0* 8.4 - 10.5 mg/dL Final  . GFR calc non Af Amer 12/08/2014 88* >90 mL/min Final  . GFR calc Af Amer 12/08/2014 >90  >90 mL/min Final   Comment: (NOTE) The eGFR has been  calculated using the CKD EPI equation. This calculation has not been validated in all clinical situations. eGFR's persistently <90 mL/min signify possible Chronic Kidney Disease.   . Anion gap 12/08/2014 6  5 - 15 Final  . WBC 12/09/2014 13.0* 4.0 - 10.5 K/uL Final  . RBC 12/09/2014 3.77* 3.87 - 5.11 MIL/uL Final  . Hemoglobin 12/09/2014 10.8* 12.0 - 15.0 g/dL Final  . HCT 12/09/2014 33.4* 36.0 - 46.0 % Final  . MCV 12/09/2014 88.6  78.0 - 100.0 fL Final  . MCH 12/09/2014 28.6  26.0 - 34.0 pg Final  . MCHC 12/09/2014 32.3  30.0 - 36.0 g/dL Final  . RDW 12/09/2014 14.9  11.5 - 15.5 % Final  . Platelets 12/09/2014 227  150 - 400 K/uL Final  . Sodium 12/09/2014 139  135 - 145 mmol/L Final  . Potassium 12/09/2014 3.5  3.5 - 5.1 mmol/L Final  . Chloride 12/09/2014 101  96 -  112 mmol/L Final  . CO2 12/09/2014 30  19 - 32 mmol/L Final  . Glucose, Bld 12/09/2014 103* 70 - 99 mg/dL Final  . BUN 12/09/2014 13  6 - 23 mg/dL Final  . Creatinine, Ser 12/09/2014 0.54  0.50 - 1.10 mg/dL Final  . Calcium 12/09/2014 8.7  8.4 - 10.5 mg/dL Final  . GFR calc non Af Amer 12/09/2014 >90  >90 mL/min Final  . GFR calc Af Amer 12/09/2014 >90  >90 mL/min Final   Comment: (NOTE) The eGFR has been calculated using the CKD EPI equation. This calculation has not been validated in all clinical situations. eGFR's persistently <90 mL/min signify possible Chronic Kidney Disease.   . Anion gap 12/09/2014 8  5 - 15 Final  Admission on 12/01/2014, Discharged on 12/01/2014  Component Date Value Ref Range Status  . WBC 12/01/2014 7.6  4.0 - 10.5 K/uL Final  . RBC 12/01/2014 4.52  3.87 - 5.11 MIL/uL Final  . Hemoglobin 12/01/2014 13.1  12.0 - 15.0 g/dL Final  . HCT 12/01/2014 39.4  36.0 - 46.0 % Final  . MCV 12/01/2014 87.2  78.0 - 100.0 fL Final  . MCH 12/01/2014 29.0  26.0 - 34.0 pg Final  . MCHC 12/01/2014 33.2  30.0 - 36.0 g/dL Final  . RDW 12/01/2014 14.3  11.5 - 15.5 % Final  . Platelets 12/01/2014 236  150 - 400 K/uL Final  . Neutrophils Relative % 12/01/2014 63  43 - 77 % Final  . Neutro Abs 12/01/2014 4.9  1.7 - 7.7 K/uL Final  . Lymphocytes Relative 12/01/2014 21  12 - 46 % Final  . Lymphs Abs 12/01/2014 1.6  0.7 - 4.0 K/uL Final  . Monocytes Relative 12/01/2014 14* 3 - 12 % Final  . Monocytes Absolute 12/01/2014 1.0  0.1 - 1.0 K/uL Final  . Eosinophils Relative 12/01/2014 2  0 - 5 % Final  . Eosinophils Absolute 12/01/2014 0.1  0.0 - 0.7 K/uL Final  . Basophils Relative 12/01/2014 0  0 - 1 % Final  . Basophils Absolute 12/01/2014 0.0  0.0 - 0.1 K/uL Final  . Sodium 12/01/2014 138  135 - 145 mmol/L Final  . Potassium 12/01/2014 3.6  3.5 - 5.1 mmol/L Final  . Chloride 12/01/2014 100  96 - 112 mmol/L Final  . CO2 12/01/2014 28  19 - 32 mmol/L Final  . Glucose, Bld  12/01/2014 124* 70 - 99 mg/dL Final  . BUN 12/01/2014 16  6 - 23 mg/dL Final  . Creatinine, Ser 12/01/2014 0.78  0.50 -  1.10 mg/dL Final  . Calcium 12/92/9090 8.6  8.4 - 10.5 mg/dL Final  . GFR calc non Af Amer 12/01/2014 88* >90 mL/min Final  . GFR calc Af Amer 12/01/2014 >90  >90 mL/min Final   Comment: (NOTE) The eGFR has been calculated using the CKD EPI equation. This calculation has not been validated in all clinical situations. eGFR's persistently <90 mL/min signify possible Chronic Kidney Disease.   Eustaquio Boyden gap 12/01/2014 10  5 - 15 Final  Hospital Outpatient Visit on 11/30/2014  Component Date Value Ref Range Status  . MRSA, PCR 11/30/2014 NEGATIVE  NEGATIVE Final  . Staphylococcus aureus 11/30/2014 POSITIVE* NEGATIVE Final   Comment:        The Xpert SA Assay (FDA approved for NASAL specimens in patients over 58 years of age), is one component of a comprehensive surveillance program.  Test performance has been validated by [facility] for patients greater than or equal to 83 year old. It is not intended to diagnose infection nor to guide or monitor treatment.   Marland Kitchen aPTT 11/30/2014 41* 24 - 37 seconds Final   Comment:        IF BASELINE aPTT IS ELEVATED, SUGGEST PATIENT RISK ASSESSMENT BE USED TO DETERMINE APPROPRIATE ANTICOAGULANT THERAPY.   . WBC 11/30/2014 7.1  4.0 - 10.5 K/uL Final  . RBC 11/30/2014 5.00  3.87 - 5.11 MIL/uL Final  . Hemoglobin 11/30/2014 14.3  12.0 - 15.0 g/dL Final  . HCT 30/14/9969 44.0  36.0 - 46.0 % Final  . MCV 11/30/2014 88.0  78.0 - 100.0 fL Final  . MCH 11/30/2014 28.6  26.0 - 34.0 pg Final  . MCHC 11/30/2014 32.5  30.0 - 36.0 g/dL Final  . RDW 24/93/2419 14.3  11.5 - 15.5 % Final  . Platelets 11/30/2014 264  150 - 400 K/uL Final  . Sodium 11/30/2014 139  135 - 145 mmol/L Final  . Potassium 11/30/2014 3.8  3.5 - 5.1 mmol/L Final  . Chloride 11/30/2014 99  96 - 112 mmol/L Final  . CO2 11/30/2014 31  19 - 32 mmol/L Final  . Glucose,  Bld 11/30/2014 116* 70 - 99 mg/dL Final  . BUN 91/44/4584 20  6 - 23 mg/dL Final  . Creatinine, Ser 11/30/2014 0.77  0.50 - 1.10 mg/dL Final  . Calcium 83/50/7573 9.8  8.4 - 10.5 mg/dL Final  . Total Protein 11/30/2014 7.3  6.0 - 8.3 g/dL Final  . Albumin 22/56/7209 3.8  3.5 - 5.2 g/dL Final  . AST 19/80/2217 32  0 - 37 U/L Final  . ALT 11/30/2014 27  0 - 35 U/L Final  . Alkaline Phosphatase 11/30/2014 75  39 - 117 U/L Final  . Total Bilirubin 11/30/2014 0.5  0.3 - 1.2 mg/dL Final  . GFR calc non Af Amer 11/30/2014 88* >90 mL/min Final  . GFR calc Af Amer 11/30/2014 >90  >90 mL/min Final   Comment: (NOTE) The eGFR has been calculated using the CKD EPI equation. This calculation has not been validated in all clinical situations. eGFR's persistently <90 mL/min signify possible Chronic Kidney Disease.   . Anion gap 11/30/2014 9  5 - 15 Final  . Prothrombin Time 11/30/2014 13.4  11.6 - 15.2 seconds Final  . INR 11/30/2014 1.01  0.00 - 1.49 Final  . ABO/RH(D) 11/30/2014 O POS   Final  . Antibody Screen 11/30/2014 NEG   Final  . Sample Expiration 11/30/2014 12/10/2014   Final  Lab on 11/04/2014  Component Date  Value Ref Range Status  . Cholesterol 11/04/2014 208* 0 - 200 mg/dL Final   ATP III Classification       Desirable:  < 200 mg/dL               Borderline High:  200 - 239 mg/dL          High:  > = 240 mg/dL  . Triglycerides 11/04/2014 161.0* 0.0 - 149.0 mg/dL Final   Normal:  <150 mg/dLBorderline High:  150 - 199 mg/dL  . HDL 11/04/2014 40.80  >39.00 mg/dL Final  . VLDL 11/04/2014 32.2  0.0 - 40.0 mg/dL Final  . LDL Cholesterol 11/04/2014 135* 0 - 99 mg/dL Final  . Total CHOL/HDL Ratio 11/04/2014 5   Final                  Men          Women1/2 Average Risk     3.4          3.3Average Risk          5.0          4.42X Average Risk          9.6          7.13X Average Risk          15.0          11.0                      . NonHDL 11/04/2014 167.20   Final   NOTE:  Non-HDL goal should  be 30 mg/dL higher than patient's LDL goal (i.e. LDL goal of < 70 mg/dL, would have non-HDL goal of < 100 mg/dL)     X-Rays:Dg Chest 2 View  11/30/2014   CLINICAL DATA:  Cough.  EXAM: CHEST  2 VIEW  COMPARISON:  07/08/2012.  FINDINGS: Mediastinum and hilar structures are normal. Mild bibasilar subsegmental atelectasis. Mild right base infiltrate cannot be excluded. No pleural effusion or pneumothorax. Heart size normal. No acute bony abnormality.  IMPRESSION: Mild bibasilar subsegmental atelectasis. Mild right base pneumonia cannot be excluded   Electronically Signed   By: Marcello Moores  Register   On: 11/30/2014 09:21    EKG: Orders placed or performed in visit on 07/28/14  . EKG 12-Lead     Hospital Course: Victoria Mosley is a 64 y.o. who was admitted to [facility]. They were brought to the operating room on 12/07/2014 and underwent Procedure(s): RIGHT TOTAL KNEE ARTHROPLASTY.  Patient tolerated the procedure well and was later transferred to the recovery room and then to the orthopaedic floor for postoperative care.  They were given PO and IV analgesics for pain control following their surgery.  They were given 24 hours of postoperative antibiotics of  Anti-infectives    Start     Dose/Rate Route Frequency Ordered Stop   12/07/14 1700  cefUROXime (CEFTIN) tablet 500 mg  Status:  Discontinued     500 mg Oral 2 times daily with meals 12/07/14 1124 12/09/14 1641   12/07/14 1400  ceFAZolin (ANCEF) IVPB 2 g/50 mL premix     2 g 100 mL/hr over 30 Minutes Intravenous Every 6 hours 12/07/14 1124 12/07/14 2103   12/07/14 0516  ceFAZolin (ANCEF) IVPB 2 g/50 mL premix     2 g 100 mL/hr over 30 Minutes Intravenous On call to O.R. 12/07/14 1914 12/07/14 0721     and started on DVT prophylaxis in  the form of Xarelto.   PT and OT were ordered for total joint protocol.  Discharge planning consulted to help with postop disposition and equipment needs.  Patient had a tough night on the evening of  surgery due to pain but she was able to walk about 25 feet the day of surgery. DC'd the CPM because of pain.  She was placed onto a Prednisone taper postop.  Given fluid bolus due to low pressures thru that first night. They started to get up OOB with therapy on day one. Hemovac drain was pulled without difficulty.  Continued to work with therapy into day two.  BP was better on day two.  Dressing was changed on day two and the incision was healing well.  Patient was seen in rounds and was ready to go home on POD 2.   Discharge home with home health Diet - Regular diet Follow up - in 2 weeks Activity - WBAT Disposition - Home Condition Upon Discharge - Good D/C Meds - See DC Summary DVT Prophylaxis - Xarelto      Discharge Instructions    Call MD / Call 911    Complete by:  As directed   If you experience chest pain or shortness of breath, CALL 911 and be transported to the hospital emergency room.  If you develope a fever above 101 F, pus (white drainage) or increased drainage or redness at the wound, or calf pain, call your surgeon's office.     Change dressing    Complete by:  As directed   Change dressing daily with sterile 4 x 4 inch gauze dressing and apply TED hose. Do not submerge the incision under water.     Constipation Prevention    Complete by:  As directed   Drink plenty of fluids.  Prune juice may be helpful.  You may use a stool softener, such as Colace (over the counter) 100 mg twice a day.  Use MiraLax (over the counter) for constipation as needed.     Diet - low sodium heart healthy    Complete by:  As directed      Discharge instructions    Complete by:  As directed   Pick up stool softner and laxative for home use following surgery while on pain medications. Do not submerge incision under water. Please use good hand washing techniques while changing dressing each day. May shower starting three days after surgery. Please use a clean towel to pat the incision dry  following showers. Continue to use ice for pain and swelling after surgery. Do not use any lotions or creams on the incision until instructed by your surgeon.  Take Xarelto for two and a half more weeks, then discontinue Xarelto. Once the patient has completed the blood thinner regimen, then take a Baby 81 mg Aspirin daily for three more weeks.  Postoperative Constipation Protocol  Constipation - defined medically as fewer than three stools per week and severe constipation as less than one stool per week.  One of the most common issues patients have following surgery is constipation.  Even if you have a regular bowel pattern at home, your normal regimen is likely to be disrupted due to multiple reasons following surgery.  Combination of anesthesia, postoperative narcotics, change in appetite and fluid intake all can affect your bowels.  In order to avoid complications following surgery, here are some recommendations in order to help you during your recovery period.  Colace (docusate) - Pick up an over-the-counter  form of Colace or another stool softener and take twice a day as long as you are requiring postoperative pain medications.  Take with a full glass of water daily.  If you experience loose stools or diarrhea, hold the colace until you stool forms back up.  If your symptoms do not get better within 1 week or if they get worse, check with your doctor.  Dulcolax (bisacodyl) - Pick up over-the-counter and take as directed by the product packaging as needed to assist with the movement of your bowels.  Take with a full glass of water.  Use this product as needed if not relieved by Colace only.   MiraLax (polyethylene glycol) - Pick up over-the-counter to have on hand.  MiraLax is a solution that will increase the amount of water in your bowels to assist with bowel movements.  Take as directed and can mix with a glass of water, juice, soda, coffee, or tea.  Take if you go more than two days without  a movement. Do not use MiraLax more than once per day. Call your doctor if you are still constipated or irregular after using this medication for 7 days in a row.  If you continue to have problems with postoperative constipation, please contact the office for further assistance and recommendations.  If you experience "the worst abdominal pain ever" or develop nausea or vomiting, please contact the office immediatly for further recommendations for treatment.     Do not put a pillow under the knee. Place it under the heel.    Complete by:  As directed      Do not sit on low chairs, stoools or toilet seats, as it may be difficult to get up from low surfaces    Complete by:  As directed      Driving restrictions    Complete by:  As directed   No driving until released by the physician.     Increase activity slowly as tolerated    Complete by:  As directed      Lifting restrictions    Complete by:  As directed   No lifting until released by the physician.     Patient may shower    Complete by:  As directed   You may shower without a dressing once there is no drainage.  Do not wash over the wound.  If drainage remains, do not shower until drainage stops.     TED hose    Complete by:  As directed   Use stockings (TED hose) for 3 weeks on both leg(s).  You may remove them at night for sleeping.     Weight bearing as tolerated    Complete by:  As directed   Laterality:  right  Extremity:  Lower            Medication List    STOP taking these medications        ALEVE 220 MG Caps  Generic drug:  Naproxen Sodium     benzoyl peroxide 5 % gel     CALCIUM CITRATE + PO     cefUROXime 500 MG tablet  Commonly known as:  CEFTIN     estropipate 0.75 MG tablet  Commonly known as:  OGEN     fish oil-omega-3 fatty acids 1000 MG capsule     Flax Oil     HYDROcodone-homatropine 5-1.5 MG/5ML syrup  Commonly known as:  HYDROMET     medroxyPROGESTERone 2.5 MG tablet  Commonly known  as:   PROVERA     nitrofurantoin (macrocrystal-monohydrate) 100 MG capsule  Commonly known as:  MACROBID     ONE-A-DAY WOMENS 50 PLUS PO     predniSONE 10 MG tablet  Commonly known as:  DELTASONE     pseudoephedrine-guaifenesin 60-600 MG per tablet  Commonly known as:  MUCINEX D     VITAMIN B COMPLEX PO     vitamin C 500 MG tablet  Commonly known as:  ASCORBIC ACID      TAKE these medications        betamethasone valerate lotion 0.1 %  Commonly known as:  VALISONE  Apply topically 2 (two) times daily.     Butalbital-APAP-Caffeine 50-300-40 MG Caps  Take 1 capsule by mouth 2 (two) times daily.     diphenhydrAMINE 25 mg capsule  Commonly known as:  BENADRYL  Take 25 mg by mouth at bedtime.     hydrochlorothiazide 25 MG tablet  Commonly known as:  HYDRODIURIL  Take 1 tablet (25 mg total) by mouth daily.     hyoscyamine 0.125 MG SL tablet  Commonly known as:  LEVSIN SL  Place 1 tab on the tongue to dissolve at the at the onset of cramping, spasms then every 4 hours as needed.     loratadine 10 MG tablet  Commonly known as:  CLARITIN  Take 10 mg by mouth daily. For allergies     omeprazole 20 MG capsule  Commonly known as:  PRILOSEC  Take 20 mg by mouth at bedtime.     omeprazole 20 MG capsule  Commonly known as:  PRILOSEC  TAKE ONE CAPSULE BY MOUTH ONCE DAILY     polyethylene glycol packet  Commonly known as:  MIRALAX / GLYCOLAX  Take 17 g by mouth daily. States takes 1/3 packet at bedtime     potassium gluconate 595 MG Tabs tablet  Take 1,190 mg by mouth daily.     propranolol 40 MG tablet  Commonly known as:  INDERAL  Take 40 mg by mouth at bedtime.     propranolol 40 MG tablet  Commonly known as:  INDERAL  TAKE ONE TABLET BY MOUTH DAILY.     pseudoephedrine 120 MG 12 hr tablet  Commonly known as:  SUDAFED  Take 120 mg by mouth 2 (two) times daily.     rivaroxaban 10 MG Tabs tablet  Commonly known as:  XARELTO  - Take 1 tablet (10 mg total) by mouth  daily with breakfast. Take Xarelto for two and a half more weeks, then discontinue Xarelto.  - Once the patient has completed the blood thinner regimen, then take a Baby 81 mg Aspirin daily for three more weeks.  Notes to Patient:  Blood thinner     traMADol 50 MG tablet  Commonly known as:  ULTRAM  Take 1-2 tablets (50-100 mg total) by mouth every 6 (six) hours as needed (mild pain).       Follow-up Information    Follow up with [facility].   Why:  home health physical therapy   Contact information:   [address] [place] [address] [phone]       Follow up with [name], MD On 12/22/2014.   Specialty:  Orthopedic Surgery   Why:  Call office at [phone] to setup appointment on Tuesday 12/22/2014 with Dr. Denman George information:   [address] [place] [address] [place] [address] [phone]       Signed:  Arlee Muslim, PA-C Orthopaedic Surgery 12/28/2014, 7:37 AM

## 2014-12-08 NOTE — Progress Notes (Signed)
Utilization review completed.  

## 2014-12-08 NOTE — Discharge Instructions (Addendum)
° °Dr. Frank Aluisio °Total Joint Specialist °[facility] °[address] °[place], [place] [address] °[phone] ° °TOTAL KNEE REPLACEMENT POSTOPERATIVE DIRECTIONS ° ° ° °Knee Rehabilitation, Guidelines Following Surgery  °Results after knee surgery are often greatly improved when you follow the exercise, range of motion and muscle strengthening exercises prescribed by your doctor. Safety measures are also important to protect the knee from further injury. Any time any of these exercises cause you to have increased pain or swelling in your knee joint, decrease the amount until you are comfortable again and slowly increase them. If you have problems or questions, call your caregiver or physical therapist for advice.  ° °HOME CARE INSTRUCTIONS  °Remove items at home which could result in a fall. This includes throw rugs or furniture in walking pathways.  °Continue medications as instructed at time of discharge. °You may have some home medications which will be placed on hold until you complete the course of blood thinner medication.  °You may start showering once you are discharged home but do not submerge the incision under water. Just pat the incision dry and apply a dry gauze dressing on daily. °Walk with walker as instructed.  °You may resume a sexual relationship in one month or when given the OK by  your doctor.  °· Use walker as long as suggested by your caregivers. °· Avoid periods of inactivity such as sitting longer than an hour when not asleep. This helps prevent blood clots.  °You may put full weight on your legs and walk as much as is comfortable.  °You may return to work once you are cleared by your doctor.  °Do not drive a car for 6 weeks or until released by you surgeon.  °· Do not drive while taking narcotics.  °Wear the elastic stockings for three weeks following surgery during the day but you may remove then at night. °Make sure you keep all of your appointments after your  operation with all of your doctors and caregivers. You should call the office at the above phone number and make an appointment for approximately two weeks after the date of your surgery. °Change the dressing daily and reapply a dry dressing each time. °Please pick up a stool softener and laxative for home use as long as you are requiring pain medications. °· ICE to the affected knee every three hours for 30 minutes at a time and then as needed for pain and swelling.  Continue to use ice on the knee for pain and swelling from surgery. You may notice swelling that will progress down to the foot and ankle.  This is normal after surgery.  Elevate the leg when you are not up walking on it.   °It is important for you to complete the blood thinner medication as prescribed by your doctor. °· Continue to use the breathing machine which will help keep your temperature down.  It is common for your temperature to cycle up and down following surgery, especially at night when you are not up moving around and exerting yourself.  The breathing machine keeps your lungs expanded and your temperature down. ° °RANGE OF MOTION AND STRENGTHENING EXERCISES  °Rehabilitation of the knee is important following a knee injury or an operation. After just a few days of immobilization, the muscles of the thigh which control the knee become weakened and shrink (atrophy). Knee exercises are designed to build up the tone and strength of the thigh muscles and to improve knee   motion. Often times heat used for twenty to thirty minutes before working out will loosen up your tissues and help with improving the range of motion but do not use heat for the first two weeks following surgery. These exercises can be done on a training (exercise) mat, on the floor, on a table or on a bed. Use what ever works the best and is most comfortable for you Knee exercises include:  °Leg Lifts - While your knee is still immobilized in a splint or cast, you can do  straight leg raises. Lift the leg to 60 degrees, hold for 3 sec, and slowly lower the leg. Repeat 10-20 times 2-3 times daily. Perform this exercise against resistance later as your knee gets better.  °Quad and Hamstring Sets - Tighten up the muscle on the front of the thigh (Quad) and hold for 5-10 sec. Repeat this 10-20 times hourly. Hamstring sets are done by pushing the foot backward against an object and holding for 5-10 sec. Repeat as with quad sets.  °A rehabilitation program following serious knee injuries can speed recovery and prevent re-injury in the future due to weakened muscles. Contact your doctor or a physical therapist for more information on knee rehabilitation.  ° °SKILLED REHAB INSTRUCTIONS: °If the patient is transferred to a skilled rehab facility following release from the hospital, a list of the current medications will be sent to the facility for the patient to continue.  When discharged from the skilled rehab facility, please have the facility set up the patient's Home Health Physical Therapy prior to being released. Also, the skilled facility will be responsible for providing the patient with their medications at time of release from the facility to include their pain medication, the muscle relaxants, and their blood thinner medication. If the patient is still at the rehab facility at time of the two week follow up appointment, the skilled rehab facility will also need to assist the patient in arranging follow up appointment in our office and any transportation needs. ° °MAKE SURE YOU:  °Understand these instructions.  °Will watch your condition.  °Will get help right away if you are not doing well or get worse.  ° ° °Pick up stool softner and laxative for home use following surgery while on pain medications. °Do not submerge incision under water. °Please use good hand washing techniques while changing dressing each day. °May shower starting three days after surgery. °Please use a clean  towel to pat the incision dry following showers. °Continue to use ice for pain and swelling after surgery. °Do not use any lotions or creams on the incision until instructed by your surgeon. ° °Take Xarelto for two and a half more weeks, then discontinue Xarelto. °Once the patient has completed the blood thinner regimen, then take a Baby 81 mg Aspirin daily for three more weeks. ° °Postoperative Constipation Protocol ° °Constipation - defined medically as fewer than three stools per week and severe constipation as less than one stool per week. ° °One of the most common issues patients have following surgery is constipation.  Even if you have a regular bowel pattern at home, your normal regimen is likely to be disrupted due to multiple reasons following surgery.  Combination of anesthesia, postoperative narcotics, change in appetite and fluid intake all can affect your bowels.  In order to avoid complications following surgery, here are some recommendations in order to help you during your recovery period. ° °Colace (docusate) - Pick up an over-the-counter form of   Colace or another stool softener and take twice a day as long as you are requiring postoperative pain medications.  Take with a full glass of water daily.  If you experience loose stools or diarrhea, hold the colace until you stool forms back up.  If your symptoms do not get better within 1 week or if they get worse, check with your doctor.  Dulcolax (bisacodyl) - Pick up over-the-counter and take as directed by the product packaging as needed to assist with the movement of your bowels.  Take with a full glass of water.  Use this product as needed if not relieved by Colace only.   MiraLax (polyethylene glycol) - Pick up over-the-counter to have on hand.  MiraLax is a solution that will increase the amount of water in your bowels to assist with bowel movements.  Take as directed and can mix with a glass of water, juice, soda, coffee, or tea.  Take if you  go more than two days without a movement. Do not use MiraLax more than once per day. Call your doctor if you are still constipated or irregular after using this medication for 7 days in a row.  If you continue to have problems with postoperative constipation, please contact the office for further assistance and recommendations.  If you experience "the worst abdominal pain ever" or develop nausea or vomiting, please contact the office immediatly for further recommendations for treatment.  Information on my medicine - XARELTO (Rivaroxaban)  This medication education was reviewed with me or my healthcare representative as part of my discharge preparation.  The pharmacist that spoke with me during my hospital stay was:  Rudean Haskell, [facility]  Why was Xarelto prescribed for you? Xarelto was prescribed for you to reduce the risk of blood clots forming after orthopedic surgery. The medical term for these abnormal blood clots is venous thromboembolism (VTE).  What do you need to know about xarelto ? Take your Xarelto ONCE DAILY at the same time every day. You may take it either with or without food.  If you have difficulty swallowing the tablet whole, you may crush it and mix in applesauce just prior to taking your dose.  Take Xarelto exactly as prescribed by your doctor and DO NOT stop taking Xarelto without talking to the doctor who prescribed the medication.  Stopping without other VTE prevention medication to take the place of Xarelto may increase your risk of developing a clot.  After discharge, you should have regular check-up appointments with your healthcare provider that is prescribing your Xarelto.    What do you do if you miss a dose? If you miss a dose, take it as soon as you remember on the same day then continue your regularly scheduled once daily regimen the next day. Do not take two doses of Xarelto on the same day.   Important Safety Information A possible side effect of  Xarelto is bleeding. You should call your healthcare provider right away if you experience any of the following: ? Bleeding from an injury or your nose that does not stop. ? Unusual colored urine (red or dark brown) or unusual colored stools (red or black). ? Unusual bruising for unknown reasons. ? A serious fall or if you hit your head (even if there is no bleeding).  Some medicines may interact with Xarelto and might increase your risk of bleeding while on Xarelto. To help avoid this, consult your healthcare provider or pharmacist prior to using any new prescription  or non-prescription medications, including herbals, vitamins, non-steroidal anti-inflammatory drugs (NSAIDs) and supplements. ° °This website has more information on Xarelto®: www.[email or web address].com. ° ° ° °

## 2014-12-08 NOTE — Progress Notes (Signed)
Subjective: 1 Day Post-Op Procedure(s) (LRB): RIGHT TOTAL KNEE ARTHROPLASTY (Right) Patient reports pain as mild and moderate last night. Little better this morning. Patient seen in rounds with Dr. Wynelle Link.  Will change to valium muscle relaxant. Patient is well, but has had some minor complaints of pain in the knee, requiring pain medications We will resume therapy today. She walked 25 feet with therapy on the day of surgery. Plan is to go Home after hospital stay.  Objective: Vital signs in last 24 hours: Temp:  [97.9 F (36.6 C)-98.8 F (37.1 C)] 98.6 F (37 C) (02/09 0444) Pulse Rate:  [58-77] 65 (02/09 0444) Resp:  [9-18] 16 (02/08 1600) BP: (99-133)/(42-77) 99/42 mmHg (02/09 0444) SpO2:  [91 %-100 %] 91 % (02/09 0444)  Intake/Output from previous day:  Intake/Output Summary (Last 24 hours) at 12/08/14 0823 Last data filed at 12/08/14 0100  Gross per 24 hour  Intake   2090 ml  Output   1257 ml  Net    833 ml    Labs:  Recent Labs  12/08/14 0555  HGB 10.6*    Recent Labs  12/08/14 0555  WBC 12.2*  RBC 3.70*  HCT 32.9*  PLT 224    Recent Labs  12/08/14 0555  NA 139  K 3.6  CL 105  CO2 28  BUN 15  CREATININE 0.77  GLUCOSE 152*  CALCIUM 8.0*   No results for input(s): LABPT, INR in the last 72 hours.  EXAM General - Patient is Alert, Appropriate and Oriented Extremity - Neurovascular intact Sensation intact distally Dorsiflexion/Plantar flexion intact Dressing - dressing C/D/I Motor Function - intact, moving foot and toes well on exam.  Hemovac pulled without difficulty.  Past Medical History  Diagnosis Date  . Menopause     sees Dr. Phineas Real  . 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 Migraines   . GERD (gastroesophageal reflux disease)   . IBS (irritable bowel syndrome)   . Arthritis   . Hyperlipidemia   . Diverticulitis   . UTI (urinary tract infection)     started atb on 03-24-12  . Bilateral knee pain     sees  Dr. Wynelle Link   . Kidney stones     bilateral  . Nephrolithiasis     left  . Normal cardiac stress test 2012  . History of renal calculi   . Bilateral renal cysts   . Neurogenic bladder   . History of melanoma excision   . Urinary urgency   . 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 Cough 11/30/14     cough since 11/20/14- now productine- green with green mucus out of nose- no fever  . Chronic UTI (urinary tract infection)     Assessment/Plan: 1 Day Post-Op Procedure(s) (LRB): RIGHT TOTAL KNEE ARTHROPLASTY (Right) Principal Problem:   OA (osteoarthritis) of knee  Estimated body mass index is 30.69 kg/(m^2) as calculated from the following:   Height as of this encounter: 5\' 7"  (1.702 m).   Weight as of this encounter: 88.905 kg (196 lb). Advance diet Up with therapy Plan for discharge tomorrow Discharge home with home health  DVT Prophylaxis - Xarelto Weight-Bearing as tolerated to right leg D/C O2 and Pulse OX and try on Room Air Will DC CPM She was placed onto a Prednisone taper postop. Giver fluid bolus due to low pressures thru the night.  Arlee Muslim, PA-C Orthopaedic  Surgery 12/08/2014, 8:23 AM

## 2014-12-09 LAB — BASIC METABOLIC PANEL
Anion gap: 8 (ref 5–15)
BUN: 13 mg/dL (ref 6–23)
CO2: 30 mmol/L (ref 19–32)
Calcium: 8.7 mg/dL (ref 8.4–10.5)
Chloride: 101 mmol/L (ref 96–112)
Creatinine, Ser: 0.54 mg/dL (ref 0.50–1.10)
GFR calc Af Amer: >90 mL/min (ref >90)
GFR calc non Af Amer: >90 mL/min (ref >90)
Glucose, Bld: 103 mg/dL — ABNORMAL HIGH (ref 70–99)
Potassium: 3.5 mmol/L (ref 3.5–5.1)
Sodium: 139 mmol/L (ref 135–145)

## 2014-12-09 LAB — CBC
HCT: 33.4 % — ABNORMAL LOW (ref 36.0–46.0)
Hemoglobin: 10.8 g/dL — ABNORMAL LOW (ref 12.0–15.0)
MCH: 28.6 pg (ref 26.0–34.0)
MCHC: 32.3 g/dL (ref 30.0–36.0)
MCV: 88.6 fL (ref 78.0–100.0)
Platelets: 227 10*3/uL (ref 150–400)
RBC: 3.77 MIL/uL — ABNORMAL LOW (ref 3.87–5.11)
RDW: 14.9 % (ref 11.5–15.5)
WBC: 13 10*3/uL — ABNORMAL HIGH (ref 4.0–10.5)

## 2014-12-09 NOTE — Plan of Care (Signed)
Problem: Phase III Progression Outcomes Goal: Anticoagulant follow-up in place Outcome: Not Applicable Date Met:  71/58/06 Xarelto VTE, no f/u needed

## 2014-12-09 NOTE — Progress Notes (Signed)
   Subjective: 2 Days Post-Op Procedure(s) (LRB): RIGHT TOTAL KNEE ARTHROPLASTY (Right) Patient reports pain as mild and moderate.   Patient seen in rounds with Dr. Wynelle Link. Husband in room Patient is well, and has had no acute complaints or problems Patient is ready to go home  Objective: Vital signs in last 24 hours: Temp:  [98.1 F (36.7 C)-98.8 F (37.1 C)] 98.1 F (36.7 C) (02/10 0605) Pulse Rate:  [61-81] 61 (02/10 0605) Resp:  [16-18] 16 (02/10 0605) BP: (109-132)/(52-73) 114/57 mmHg (02/10 0605) SpO2:  [95 %-99 %] 98 % (02/10 0605)  Intake/Output from previous day:  Intake/Output Summary (Last 24 hours) at 12/09/14 0748 Last data filed at 12/09/14 0404  Gross per 24 hour  Intake   1200 ml  Output   2800 ml  Net  -1600 ml     Labs:  Recent Labs  12/08/14 0555 12/09/14 0536  HGB 10.6* 10.8*    Recent Labs  12/08/14 0555 12/09/14 0536  WBC 12.2* 13.0*  RBC 3.70* 3.77*  HCT 32.9* 33.4*  PLT 224 227    Recent Labs  12/08/14 0555 12/09/14 0536  NA 139 139  K 3.6 3.5  CL 105 101  CO2 28 30  BUN 15 13  CREATININE 0.77 0.54  GLUCOSE 152* 103*  CALCIUM 8.0* 8.7   No results for input(s): LABPT, INR in the last 72 hours.  EXAM: General - Patient is Alert, Appropriate and Oriented Extremity - Neurovascular intact Sensation intact distally Dorsiflexion/Plantar flexion intact Incision - clean, dry, scant drainage on dressing, no active bleeding Motor Function - intact, moving foot and toes well on exam.   Assessment/Plan: 2 Days Post-Op Procedure(s) (LRB): RIGHT TOTAL KNEE ARTHROPLASTY (Right) Procedure(s) (LRB): RIGHT TOTAL KNEE ARTHROPLASTY (Right) Past Medical History  Diagnosis Date  . Menopause     sees Dr. Phineas Real  . 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 Migraines   . GERD (gastroesophageal reflux disease)   . IBS (irritable bowel syndrome)   . Arthritis   . Hyperlipidemia   . Diverticulitis   . UTI (urinary  tract infection)     started atb on 03-24-12  . Bilateral knee pain     sees Dr. Wynelle Link   . Kidney stones     bilateral  . Nephrolithiasis     left  . Normal cardiac stress test 2012  . History of renal calculi   . Bilateral renal cysts   . Neurogenic bladder   . History of melanoma excision   . Urinary urgency   . 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 Cough 11/30/14     cough since 11/20/14- now productine- green with green mucus out of nose- no fever  . Chronic UTI (urinary tract infection)    Principal Problem:   OA (osteoarthritis) of knee  Estimated body mass index is 30.69 kg/(m^2) as calculated from the following:   Height as of this encounter: 5\' 7"  (1.702 m).   Weight as of this encounter: 88.905 kg (196 lb). Up with therapy Discharge home with home health Diet - Regular diet Follow up - in 2 weeks Activity - WBAT Disposition - Home Condition Upon Discharge - Good D/C Meds - See DC Summary DVT Prophylaxis - Xarelto  Arlee Muslim, PA-C Orthopaedic Surgery 12/09/2014, 7:48 AM

## 2014-12-09 NOTE — Progress Notes (Signed)
Occupational Therapy Treatment Patient Details Name: Victoria Mosley MRN: [id number] DOB: Mar 20, 1951 Today's Date: 12/09/2014    History of present illness Pt is a 64 year old female s/p R TKA   OT comments  Pt ready for DC from OT standpoint  Follow Up Recommendations  No OT follow up    Equipment Recommendations       Recommendations for Other Services      Precautions / Restrictions Precautions Precautions: Knee       Mobility Bed Mobility               General bed mobility comments: pt in chair  Transfers Overall transfer level: Needs assistance Equipment used: Rolling walker (2 wheeled) Transfers: Sit to/from Stand Sit to Stand: Supervision Stand pivot transfers: Supervision            Balance                                   ADL                           Toilet Transfer: Supervision/safety;RW;Comfort height toilet;Ambulation   Toileting- Clothing Manipulation and Hygiene: Sit to/from stand;Set up         General ADL Comments: husband will A as needed      Vision                     Perception     Praxis      Cognition   Behavior During Therapy: WFL for tasks assessed/performed Overall Cognitive Status: Within Functional Limits for tasks assessed                       Extremity/Trunk Assessment               Exercises     Shoulder Instructions       General Comments      Pertinent Vitals/ Pain       Pain Location: r knee Pain Intervention(s): Monitored during session;Repositioned;Ice applied  Home Living                                          Prior Functioning/Environment              Frequency Min 2X/week     Progress Toward Goals  OT Goals(current goals can now be found in the care plan section)  Progress towards OT goals: Progressing toward goals     Plan Discharge plan remains appropriate;Discharge plan needs to be updated     Co-evaluation                 End of Session Equipment Utilized During Treatment: Rolling walker   Activity Tolerance Patient tolerated treatment well   Patient Left in chair;with family/visitor present;with call bell/phone within reach   Nurse Communication Mobility status        Time: 1030-1043 OT Time Calculation (min): 13 min  Charges: OT General Charges $OT Visit: 1 Procedure OT Treatments $Self Care/Home Management : 8-22 mins  Soniyah Mcglory D 12/09/2014, 10:54 AM

## 2014-12-09 NOTE — Progress Notes (Signed)
Physical Therapy Treatment Patient Details Name: Rodnisha Blomgren Wolfley MRN: [id number] DOB: 1950-12-08 Today's Date: 12/09/2014    History of Present Illness Pt is a 64 year old female s/p R TKA    PT Comments    Pt tolerating mobility better today.  Pt also performed LE exercises.  Spouse present and educated on exercises as well as using ice packs.  Pt and spouse had no further questions and pt ready for d/c home today.  Follow Up Recommendations  Home health PT     Equipment Recommendations  None recommended by PT    Recommendations for Other Services       Precautions / Restrictions Precautions Precautions: Knee Required Braces or Orthoses: Knee Immobilizer - Right Knee Immobilizer - Right: Discontinue once straight leg raise with < 10 degree lag Restrictions Weight Bearing Restrictions: Yes RLE Weight Bearing: Weight bearing as tolerated    Mobility  Bed Mobility               General bed mobility comments: pt up in recliner on arrival  Transfers Overall transfer level: Needs assistance Equipment used: Rolling walker (2 wheeled) Transfers: Sit to/from Stand Sit to Stand: Supervision Stand pivot transfers: Supervision          Ambulation/Gait Ambulation/Gait assistance: Supervision Ambulation Distance (Feet): 80 Feet Assistive device: Rolling walker (2 wheeled) Gait Pattern/deviations: Step-to pattern;Antalgic;Trunk flexed     General Gait Details: verbal cues for sequence, RW distance, step length   Stairs            Wheelchair Mobility    Modified Rankin (Stroke Patients Only)       Balance                                    Cognition Arousal/Alertness: Awake/alert Behavior During Therapy: WFL for tasks assessed/performed Overall Cognitive Status: Within Functional Limits for tasks assessed                      Exercises Total Joint Exercises Ankle Circles/Pumps: AROM;Both;15 reps Quad Sets: AROM;Both;15  reps Towel Squeeze: AROM;Both;15 reps Short Arc QuadSinclair Ship;Right;15 reps Heel Slides: AAROM;Right;15 reps Hip ABduction/ADduction: AAROM;Right;15 reps Straight Leg Raises: AAROM;Right;10 reps    General Comments        Pertinent Vitals/Pain Pain Assessment: 0-10 Pain Score: 4  Pain Location: R thigh Pain Descriptors / Indicators: Sore Pain Intervention(s): Limited activity within patient's tolerance;Monitored during session;Ice applied;Premedicated before session    Home Living                      Prior Function            PT Goals (current goals can now be found in the care plan section) Progress towards PT goals: Progressing toward goals    Frequency  7X/week    PT Plan Current plan remains appropriate    Co-evaluation             End of Session Equipment Utilized During Treatment: Right knee immobilizer Activity Tolerance: Patient limited by pain Patient left: with call bell/phone within reach;with family/visitor present;in chair     Time: 3536-1443 PT Time Calculation (min) (ACUTE ONLY): 29 min  Charges:  $Gait Training: 8-22 mins $Therapeutic Exercise: 8-22 mins                    G Codes:  Shion Bluestein,KATHrine E 12/09/2014, 11:57 AM Carmelia Bake, PT, DPT 12/09/2014 Pager: [phone]

## 2014-12-18 ENCOUNTER — Observation Stay (HOSPITAL_COMMUNITY)
Admission: EM | Admit: 2014-12-18 | Discharge: 2014-12-21 | Disposition: A | Discharge status: Home / Self Care | Payer: 59 | Attending: Internal Medicine | Admitting: Internal Medicine

## 2014-12-18 ENCOUNTER — Encounter (HOSPITAL_COMMUNITY): Payer: Self-pay | Admitting: Emergency Medicine

## 2014-12-18 DIAGNOSIS — Z7952 Long term (current) use of systemic steroids: Secondary | ICD-10-CM | POA: Insufficient documentation

## 2014-12-18 DIAGNOSIS — Z79891 Long term (current) use of opiate analgesic: Secondary | ICD-10-CM | POA: Insufficient documentation

## 2014-12-18 DIAGNOSIS — M51369 Other intervertebral disc degeneration, lumbar region without mention of lumbar back pain or lower extremity pain: Secondary | ICD-10-CM | POA: Diagnosis present

## 2014-12-18 DIAGNOSIS — Z7901 Long term (current) use of anticoagulants: Secondary | ICD-10-CM | POA: Insufficient documentation

## 2014-12-18 DIAGNOSIS — K219 Gastro-esophageal reflux disease without esophagitis: Secondary | ICD-10-CM | POA: Diagnosis not present

## 2014-12-18 DIAGNOSIS — M5442 Lumbago with sciatica, left side: Principal | ICD-10-CM | POA: Insufficient documentation

## 2014-12-18 DIAGNOSIS — M25562 Pain in left knee: Secondary | ICD-10-CM | POA: Insufficient documentation

## 2014-12-18 DIAGNOSIS — G43909 Migraine, unspecified, not intractable, without status migrainosus: Secondary | ICD-10-CM | POA: Diagnosis present

## 2014-12-18 DIAGNOSIS — Z96651 Presence of right artificial knee joint: Secondary | ICD-10-CM | POA: Insufficient documentation

## 2014-12-18 DIAGNOSIS — E785 Hyperlipidemia, unspecified: Secondary | ICD-10-CM | POA: Insufficient documentation

## 2014-12-18 DIAGNOSIS — M5136 Other intervertebral disc degeneration, lumbar region: Secondary | ICD-10-CM | POA: Insufficient documentation

## 2014-12-18 DIAGNOSIS — Z96659 Presence of unspecified artificial knee joint: Secondary | ICD-10-CM

## 2014-12-18 DIAGNOSIS — Z87891 Personal history of nicotine dependence: Secondary | ICD-10-CM | POA: Insufficient documentation

## 2014-12-18 DIAGNOSIS — M5432 Sciatica, left side: Secondary | ICD-10-CM | POA: Diagnosis present

## 2014-12-18 DIAGNOSIS — R509 Fever, unspecified: Secondary | ICD-10-CM | POA: Diagnosis present

## 2014-12-18 DIAGNOSIS — M549 Dorsalgia, unspecified: Secondary | ICD-10-CM | POA: Diagnosis present

## 2014-12-18 DIAGNOSIS — Z79899 Other long term (current) drug therapy: Secondary | ICD-10-CM | POA: Diagnosis not present

## 2014-12-18 NOTE — ED Notes (Signed)
Bed: [facility]PT1 Expected date:  Expected time:  Means of arrival:  Comments: 15F sciatica/knee pain

## 2014-12-18 NOTE — ED Notes (Signed)
Pt reports being treated for UTI, states she has not been drinking fluids so could possible still have UTI. Reports having lower back pain and right knee pain (replacement). Pt is alert and oriented. Pt reports running a fever but did not take anything because she wanted [place] to see all of the symptoms.

## 2014-12-19 DIAGNOSIS — G43009 Migraine without aura, not intractable, without status migrainosus: Secondary | ICD-10-CM

## 2014-12-19 DIAGNOSIS — M5442 Lumbago with sciatica, left side: Principal | ICD-10-CM

## 2014-12-19 DIAGNOSIS — M549 Dorsalgia, unspecified: Secondary | ICD-10-CM | POA: Diagnosis present

## 2014-12-19 DIAGNOSIS — Z96659 Presence of unspecified artificial knee joint: Secondary | ICD-10-CM

## 2014-12-19 DIAGNOSIS — Z96651 Presence of right artificial knee joint: Secondary | ICD-10-CM

## 2014-12-19 DIAGNOSIS — R509 Fever, unspecified: Secondary | ICD-10-CM | POA: Diagnosis present

## 2014-12-19 DIAGNOSIS — M5136 Other intervertebral disc degeneration, lumbar region: Secondary | ICD-10-CM | POA: Diagnosis present

## 2014-12-19 DIAGNOSIS — M5432 Sciatica, left side: Secondary | ICD-10-CM | POA: Diagnosis present

## 2014-12-19 DIAGNOSIS — K219 Gastro-esophageal reflux disease without esophagitis: Secondary | ICD-10-CM

## 2014-12-19 DIAGNOSIS — G43909 Migraine, unspecified, not intractable, without status migrainosus: Secondary | ICD-10-CM | POA: Diagnosis present

## 2014-12-19 LAB — CBC WITH DIFFERENTIAL/PLATELET
Basophils Absolute: 0 10*3/uL (ref 0.0–0.1)
Basophils Relative: 0 % (ref 0–1)
Eosinophils Absolute: 0.1 10*3/uL (ref 0.0–0.7)
Eosinophils Relative: 1 % (ref 0–5)
HCT: 39.2 % (ref 36.0–46.0)
Hemoglobin: 12.8 g/dL (ref 12.0–15.0)
Lymphocytes Relative: 16 % (ref 12–46)
Lymphs Abs: 2.1 10*3/uL (ref 0.7–4.0)
MCH: 28.7 pg (ref 26.0–34.0)
MCHC: 32.7 g/dL (ref 30.0–36.0)
MCV: 87.9 fL (ref 78.0–100.0)
Monocytes Absolute: 1.8 10*3/uL — ABNORMAL HIGH (ref 0.1–1.0)
Monocytes Relative: 14 % — ABNORMAL HIGH (ref 3–12)
Neutro Abs: 8.8 10*3/uL — ABNORMAL HIGH (ref 1.7–7.7)
Neutrophils Relative %: 69 % (ref 43–77)
Platelets: 391 10*3/uL (ref 150–400)
RBC: 4.46 MIL/uL (ref 3.87–5.11)
RDW: 15.1 % (ref 11.5–15.5)
WBC: 12.7 10*3/uL — ABNORMAL HIGH (ref 4.0–10.5)

## 2014-12-19 LAB — URINE MICROSCOPIC-ADD ON

## 2014-12-19 LAB — URINALYSIS, ROUTINE W REFLEX MICROSCOPIC
Bilirubin Urine: NEGATIVE
Glucose, UA: NEGATIVE mg/dL
Hgb urine dipstick: NEGATIVE
Ketones, ur: NEGATIVE mg/dL
Nitrite: NEGATIVE
Protein, ur: NEGATIVE mg/dL
Specific Gravity, Urine: 1.019 (ref 1.005–1.030)
Urobilinogen, UA: 0.2 mg/dL (ref 0.0–1.0)
pH: 6.5 (ref 5.0–8.0)

## 2014-12-19 LAB — I-STAT CHEM 8, ED
BUN: 21 mg/dL (ref 6–23)
Calcium, Ion: 1.14 mmol/L (ref 1.13–1.30)
Chloride: 96 mmol/L (ref 96–112)
Creatinine, Ser: 0.8 mg/dL (ref 0.50–1.10)
Glucose, Bld: 129 mg/dL — ABNORMAL HIGH (ref 70–99)
HCT: 42 % (ref 36.0–46.0)
Hemoglobin: 14.3 g/dL (ref 12.0–15.0)
Potassium: 3.4 mmol/L — ABNORMAL LOW (ref 3.5–5.1)
Sodium: 138 mmol/L (ref 135–145)
TCO2: 29 mmol/L (ref 0–100)

## 2014-12-19 MED ORDER — POTASSIUM CHLORIDE CRYS ER 20 MEQ PO TBCR
40.0000 meq | EXTENDED_RELEASE_TABLET | Freq: Two times a day (BID) | ORAL | Status: AC
  Administered 2014-12-19 (×2): 40 meq via ORAL
  Filled 2014-12-19 (×2): qty 2

## 2014-12-19 MED ORDER — POLYETHYLENE GLYCOL 3350 17 G PO PACK: 17.0000 g | PACK | Freq: Every day | ORAL | Status: DC

## 2014-12-19 MED ORDER — DEXTROSE 5 % IV SOLN
500.0000 mg | Freq: Three times a day (TID) | INTRAVENOUS | Status: DC | PRN
  Administered 2014-12-19 – 2014-12-21 (×5): 500 mg via INTRAVENOUS
  Filled 2014-12-19 (×8): qty 5

## 2014-12-19 MED ORDER — HYDROMORPHONE HCL 1 MG/ML IJ SOLN
1.0000 mg | INTRAMUSCULAR | Status: DC | PRN
  Administered 2014-12-19 (×3): 1 mg via INTRAVENOUS
  Filled 2014-12-19 (×4): qty 1

## 2014-12-19 MED ORDER — HYDROCHLOROTHIAZIDE 25 MG PO TABS
25.0000 mg | ORAL_TABLET | Freq: Every day | ORAL | Status: DC
  Filled 2014-12-19: qty 1

## 2014-12-19 MED ORDER — ALUM & MAG HYDROXIDE-SIMETH 200-200-20 MG/5ML PO SUSP: 30.0000 mL | Freq: Four times a day (QID) | ORAL | Status: DC | PRN

## 2014-12-19 MED ORDER — ZOLPIDEM TARTRATE 5 MG PO TABS: 5.0000 mg | ORAL_TABLET | Freq: Every day | ORAL | Status: DC

## 2014-12-19 MED ORDER — ONDANSETRON HCL 4 MG PO TABS
8.0000 mg | ORAL_TABLET | Freq: Four times a day (QID) | ORAL | Status: DC | PRN
  Administered 2014-12-20: 8 mg via ORAL
  Filled 2014-12-19: qty 2

## 2014-12-19 MED ORDER — HYDROMORPHONE HCL 2 MG PO TABS
4.0000 mg | ORAL_TABLET | ORAL | Status: DC | PRN
  Administered 2014-12-20 – 2014-12-21 (×8): 4 mg via ORAL
  Filled 2014-12-19 (×8): qty 2

## 2014-12-19 MED ORDER — PANTOPRAZOLE SODIUM 40 MG PO TBEC
40.0000 mg | DELAYED_RELEASE_TABLET | Freq: Every day | ORAL | Status: DC
  Administered 2014-12-19 – 2014-12-21 (×3): 40 mg via ORAL
  Filled 2014-12-19 (×4): qty 1

## 2014-12-19 MED ORDER — PROPRANOLOL HCL 40 MG PO TABS
40.0000 mg | ORAL_TABLET | Freq: Every day | ORAL | Status: DC
  Administered 2014-12-19 – 2014-12-20 (×2): 40 mg via ORAL
  Filled 2014-12-19 (×3): qty 1

## 2014-12-19 MED ORDER — PROMETHAZINE HCL 25 MG PO TABS
25.0000 mg | ORAL_TABLET | Freq: Four times a day (QID) | ORAL | Status: DC | PRN
  Administered 2014-12-20: 25 mg via ORAL
  Filled 2014-12-19: qty 1

## 2014-12-19 MED ORDER — MORPHINE SULFATE 4 MG/ML IJ SOLN
4.0000 mg | Freq: Once | INTRAMUSCULAR | Status: AC
  Administered 2014-12-19: 4 mg via INTRAVENOUS
  Filled 2014-12-19: qty 1

## 2014-12-19 MED ORDER — PREDNISONE 20 MG PO TABS
40.0000 mg | ORAL_TABLET | Freq: Every day | ORAL | Status: DC
  Administered 2014-12-20 – 2014-12-21 (×2): 40 mg via ORAL
  Filled 2014-12-19 (×3): qty 2

## 2014-12-19 MED ORDER — BUTALBITAL-APAP-CAFFEINE 50-300-40 MG PO CAPS: 1.0000 | ORAL_CAPSULE | Freq: Two times a day (BID) | ORAL | Status: DC

## 2014-12-19 MED ORDER — ONDANSETRON HCL 4 MG/2ML IJ SOLN
4.0000 mg | Freq: Four times a day (QID) | INTRAMUSCULAR | Status: DC | PRN
  Administered 2014-12-19: 4 mg via INTRAVENOUS
  Filled 2014-12-19: qty 2

## 2014-12-19 MED ORDER — SODIUM CHLORIDE 0.9 % IV BOLUS (SEPSIS)
1000.0000 mL | Freq: Once | INTRAVENOUS | Status: AC
  Administered 2014-12-19: 1000 mL via INTRAVENOUS

## 2014-12-19 MED ORDER — ONDANSETRON 8 MG/NS 50 ML IVPB
8.0000 mg | Freq: Four times a day (QID) | INTRAVENOUS | Status: DC | PRN
  Filled 2014-12-19: qty 8

## 2014-12-19 MED ORDER — HYDROMORPHONE HCL 1 MG/ML IJ SOLN
1.0000 mg | Freq: Once | INTRAMUSCULAR | Status: AC
  Administered 2014-12-19: 1 mg via INTRAVENOUS
  Filled 2014-12-19: qty 1

## 2014-12-19 MED ORDER — ONDANSETRON HCL 4 MG PO TABS: 4.0000 mg | ORAL_TABLET | Freq: Four times a day (QID) | ORAL | Status: DC | PRN

## 2014-12-19 MED ORDER — NITROFURANTOIN MONOHYD MACRO 100 MG PO CAPS
100.0000 mg | ORAL_CAPSULE | Freq: Two times a day (BID) | ORAL | Status: DC
  Administered 2014-12-19 – 2014-12-21 (×5): 100 mg via ORAL
  Filled 2014-12-19 (×6): qty 1

## 2014-12-19 MED ORDER — RIVAROXABAN 10 MG PO TABS
10.0000 mg | ORAL_TABLET | Freq: Every day | ORAL | Status: DC
  Administered 2014-12-19 – 2014-12-21 (×3): 10 mg via ORAL
  Filled 2014-12-19 (×4): qty 1

## 2014-12-19 MED ORDER — PROMETHAZINE HCL 25 MG RE SUPP
12.5000 mg | Freq: Four times a day (QID) | RECTAL | Status: DC | PRN
Start: 2014-12-19 — End: 2014-12-21

## 2014-12-19 MED ORDER — DIAZEPAM 5 MG/ML IJ SOLN
5.0000 mg | Freq: Once | INTRAMUSCULAR | Status: AC
  Administered 2014-12-19: 5 mg via INTRAVENOUS
  Filled 2014-12-19: qty 2

## 2014-12-19 MED ORDER — ACETAMINOPHEN 650 MG RE SUPP: 650.0000 mg | Freq: Four times a day (QID) | RECTAL | Status: DC | PRN

## 2014-12-19 MED ORDER — HYDROMORPHONE HCL 1 MG/ML IJ SOLN: 1.0000 mg | INTRAMUSCULAR | Status: DC | PRN

## 2014-12-19 MED ORDER — PREDNISONE 20 MG PO TABS: 40.0000 mg | ORAL_TABLET | Freq: Every day | ORAL | Status: DC

## 2014-12-19 MED ORDER — PROMETHAZINE HCL 25 MG/ML IJ SOLN
12.5000 mg | Freq: Four times a day (QID) | INTRAMUSCULAR | Status: DC | PRN
Start: 2014-12-19 — End: 2014-12-21
  Administered 2014-12-19: 12.5 mg via INTRAVENOUS
  Filled 2014-12-19: qty 1

## 2014-12-19 MED ORDER — LORATADINE 10 MG PO TABS
10.0000 mg | ORAL_TABLET | Freq: Every day | ORAL | Status: DC
  Administered 2014-12-19 – 2014-12-21 (×3): 10 mg via ORAL
  Filled 2014-12-19 (×3): qty 1

## 2014-12-19 MED ORDER — ONDANSETRON HCL 4 MG/2ML IJ SOLN
4.0000 mg | Freq: Once | INTRAMUSCULAR | Status: AC
  Administered 2014-12-19: 4 mg via INTRAVENOUS
  Filled 2014-12-19: qty 2

## 2014-12-19 MED ORDER — KETOROLAC TROMETHAMINE 30 MG/ML IJ SOLN
30.0000 mg | Freq: Once | INTRAMUSCULAR | Status: AC
  Administered 2014-12-19: 30 mg via INTRAVENOUS
  Filled 2014-12-19: qty 1

## 2014-12-19 MED ORDER — ACETAMINOPHEN 325 MG PO TABS
650.0000 mg | ORAL_TABLET | Freq: Four times a day (QID) | ORAL | Status: DC | PRN
  Administered 2014-12-19 – 2014-12-21 (×5): 650 mg via ORAL
  Filled 2014-12-19 (×5): qty 2

## 2014-12-19 MED ORDER — SODIUM CHLORIDE 0.9 % IV SOLN
INTRAVENOUS | Status: DC
  Administered 2014-12-19 – 2014-12-20 (×2): via INTRAVENOUS

## 2014-12-19 MED ORDER — POLYETHYLENE GLYCOL 3350 17 G PO PACK
17.0000 g | PACK | Freq: Two times a day (BID) | ORAL | Status: AC
  Administered 2014-12-19: 17 g via ORAL

## 2014-12-19 MED ORDER — BUTALBITAL-APAP-CAFFEINE 50-325-40 MG PO TABS
1.0000 | ORAL_TABLET | Freq: Two times a day (BID) | ORAL | Status: DC
  Administered 2014-12-19 – 2014-12-21 (×5): 1 via ORAL
  Filled 2014-12-19 (×6): qty 1

## 2014-12-19 MED ORDER — DIPHENHYDRAMINE HCL 25 MG PO CAPS: 25.0000 mg | ORAL_CAPSULE | Freq: Every day | ORAL | Status: DC

## 2014-12-19 NOTE — Progress Notes (Signed)
PT Cancellation Note  Patient Details Name: Victoria Mosley MRN: [id number] DOB: 1951-03-30   Cancelled Treatment:     PT deferred this date 2* pt elevated pain level.  Will follow in am   Arilynn Blakeney 12/19/2014, 1:59 PM

## 2014-12-19 NOTE — Progress Notes (Signed)
   [facility] PROGRESS NOTE Assessment/Plan: S/P total knee arthroplasty - Continue narcotics for pain reconsult physical therapy.  Intractable back pain/Sciatica of left side: - Likely her recent total knee replacement is contributing to overuse of her lower back which is leading to acute back pain.  - Continue IV pain medications.  - short course of steroids - start miralax.  Migraines - control.   Code Status: full Family Communication: husband  Disposition Plan: obervation   Consultants:  Ortho  Procedures:  none  Antibiotics:  None  HPI/Subjective: Pain control.  Objective: Filed Vitals:   12/19/14 0354 12/19/14 0400 12/19/14 0500 12/19/14 0753  BP: 113/62 108/56 107/52 123/61  Pulse: 84 82 77 73  Temp:      TempSrc:      Resp: 18   18  SpO2: 99% 90% 90% 95%    Intake/Output Summary (Last 24 hours) at 12/19/14 1009 Last data filed at 12/19/14 0930  Gross per 24 hour  Intake      0 ml  Output    400 ml  Net   -400 ml   There were no vitals filed for this visit.  Exam:  General: Alert, awake, oriented x3, in no acute distress.  HEENT: No bruits, no goiter.  Heart: Regular rate and rhythm. Lungs: Good air movement, clear Abdomen: Soft, nontender, nondistended, positive bowel sounds.  Neuro: Grossly intact, nonfocal.   Data Reviewed: Basic Metabolic Panel:  Recent Labs Lab 12/19/14 0119  NA 138  K 3.4*  CL 96  GLUCOSE 129*  BUN 21  CREATININE 0.80   Liver Function Tests: No results for input(s): AST, ALT, ALKPHOS, BILITOT, PROT, ALBUMIN in the last 168 hours. No results for input(s): LIPASE, AMYLASE in the last 168 hours. No results for input(s): AMMONIA in the last 168 hours. CBC:  Recent Labs Lab 12/19/14 0016 12/19/14 0119  WBC 12.7*  --   NEUTROABS 8.8*  --   HGB 12.8 14.3  HCT 39.2 42.0  MCV 87.9  --   PLT 391  --    Cardiac Enzymes: No results for input(s): CKTOTAL, CKMB, CKMBINDEX, TROPONINI in the last  168 hours. BNP (last 3 results) No results for input(s): BNP in the last 8760 hours.  ProBNP (last 3 results) No results for input(s): PROBNP in the last 8760 hours.  CBG: No results for input(s): GLUCAP in the last 168 hours.  No results found for this or any previous visit (from the past 240 hour(s)).   Studies: No results found.  Scheduled Meds: . butalbital-acetaminophen-caffeine  1 tablet Oral BID  . hydrochlorothiazide  25 mg Oral Daily  . loratadine  10 mg Oral Daily  . nitrofurantoin (macrocrystal-monohydrate)  100 mg Oral BID  . pantoprazole  40 mg Oral Daily  . polyethylene glycol  17 g Oral Daily  . potassium chloride  40 mEq Oral BID  . propranolol  40 mg Oral QHS  . rivaroxaban  10 mg Oral Q breakfast  . zolpidem  5 mg Oral QHS   Continuous Infusions: . sodium chloride       Charlynne Cousins  [facility] Pager [phone]. If 8PM-8AM, please contact night-coverage at [email or web address], password [facility] 12/19/2014, 10:09 AM  LOS: 0 days

## 2014-12-19 NOTE — Progress Notes (Signed)
Utilization Review completed.  

## 2014-12-19 NOTE — ED Notes (Signed)
Patient continues to state that she is unable to walk.

## 2014-12-19 NOTE — ED Provider Notes (Signed)
CSN: [id number]     Arrival date & time 12/18/14  2300 History   First MD Initiated Contact with Patient 12/18/14 2354     Chief Complaint  Patient presents with  . Possible UTI   . Knee Pain     (Consider location/radiation/quality/duration/timing/severity/associated sxs/prior Treatment) HPI Comments: Patient presents to the emergency department with chief complaint of back pain and knee pain. She states that she is unable to ambulate secondary to pain. She states that the pain in her low back is excruciating. She she has a history of herniated disks, and thinks that she has another herniated disc. She is also unable to compensate because of a recent right total knee replacement. She states that she cannot ambulate at all. Her pain is uncontrolled  The history is provided by the patient. No language interpreter was used.    Past Medical History  Diagnosis Date  . Menopause     sees Dr. Phineas Real  . 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 Migraines   . GERD (gastroesophageal reflux disease)   . IBS (irritable bowel syndrome)   . Arthritis   . Hyperlipidemia   . Diverticulitis   . UTI (urinary tract infection)     started atb on 03-24-12  . Bilateral knee pain     sees Dr. Wynelle Link   . Kidney stones     bilateral  . Nephrolithiasis     left  . Normal cardiac stress test 2012  . History of renal calculi   . Bilateral renal cysts   . Neurogenic bladder   . History of melanoma excision   . Urinary urgency   . 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 Cough 11/30/14     cough since 11/20/14- now productine- green with green mucus out of nose- no fever  . Chronic UTI (urinary tract infection)    Past Surgical History  Procedure Laterality Date  . Melanoma excision  2007    left upper arm per dr. Teressa Senter  . Foot surgery Bilateral 1987    per Dr. Alphonzo Cruise  . Nasal septum surgery  1992    per Dr. Cynda Familia  . Lithotripsy  2007    x 3  .  Lumbar laminectomy  1993    1993, at L5 per Dr. Quentin Cornwall  . Colonoscopy  03-28-12    per Dr. Henrene Pastor, polyp,  repeat in 5 yrs  . Cesarean section    . Tubal ligation    . Kidney stone surgery  2005    per Dr. Reece Agar  . Total hip arthroplasty Right 2010    [place]  . Posterior cervical laminectomy  07/08/2012    Procedure: POSTERIOR CERVICAL LAMINECTOMY;  Surgeon: Kristeen Miss, MD;  Location: [facility];  Service: Neurosurgery;  Laterality: N/A;  Cervical Seven- thoracic One Laminectomy and Decompression of Epidural Hematoma  . Hematoma evacuation  07/08/2012    Procedure: EVACUATION HEMATOMA;  Surgeon: Kristeen Miss, MD;  Location: [facility] [facility];  Service: Neurosurgery;  Laterality: N/A;  Cervical Seven-thoracic One Laminectomy and Decompression of Epidural Hematoma  . Cystoscopy with retrograde pyelogram, ureteroscopy and stent placement Left 12/25/2012    Procedure: CYSTOSCOPY WITH RETROGRADE PYELOGRAM, URETEROSCOPY, EXTRACTION OF  LEFT STONE WITH BASKETAND STENT PLACEMENT;  Surgeon: Molli Hazard, MD;  Location: [facility];  Service: Urology;  Laterality: Left;  2.5 HRS   . Total knee arthroplasty Right  12/07/2014    Procedure: RIGHT TOTAL KNEE ARTHROPLASTY;  Surgeon: [name], MD;  Location: [facility];  Service: Orthopedics;  Laterality: Right;   Family History  Problem Relation Age of Onset  . Heart disease Mother   . Hypertension Mother   . Diabetes Mother   . Hypertension Father   . Leukemia Father   . Hypertension Sister   . Heart disease Sister   . Bladder Cancer Sister   . Stomach cancer Paternal Grandmother   . Colon cancer Neg Hx   . Esophageal cancer Neg Hx   . Rectal cancer Neg Hx    History  Substance Use Topics  . Smoking status: Former Smoker -- 0.25 packs/day    Quit date: 12/20/1998  . Smokeless tobacco: Never Used  . Alcohol Use: No   OB History    Gravida Para Term Preterm AB TAB SAB Ectopic Multiple Living   1 1 1       1       Review of Systems  Constitutional: Negative for fever and chills.  Gastrointestinal:       No bowel incontinence  Genitourinary:       No urinary incontinence  Musculoskeletal: Positive for myalgias, back pain and arthralgias.  Neurological:       No saddle anesthesia  All other systems reviewed and are negative.     Allergies  Pravastatin sodium; Biaxin; Ciprofloxacin; Clarithromycin; and Levaquin  Home Medications   Prior to Admission medications   Medication Sig Start Date End Date Taking? Authorizing Provider  diphenhydrAMINE (BENADRYL) 25 mg capsule Take 25 mg by mouth at bedtime.     Yes Historical Provider, MD  estropipate (OGEN) 0.75 MG tablet Take 0.75 mg by mouth daily.   Yes Historical Provider, MD  hydrochlorothiazide (HYDRODIURIL) 25 MG tablet Take 1 tablet (25 mg total) by mouth daily. 07/28/14  Yes Laurey Morale, MD  HYDROmorphone (DILAUDID) 4 MG tablet Take 4 mg by mouth every 4 (four) hours as needed for severe pain.   Yes Historical Provider, MD  loratadine (CLARITIN) 10 MG tablet Take 10 mg by mouth daily. For allergies   Yes Historical Provider, MD  medroxyPROGESTERone (PROVERA) 2.5 MG tablet Take 2.5 mg by mouth daily.   Yes Historical Provider, MD  omeprazole (PRILOSEC) 20 MG capsule TAKE ONE CAPSULE BY MOUTH ONCE DAILY 11/02/14  Yes Laurey Morale, MD  omeprazole (PRILOSEC) 20 MG capsule Take 20 mg by mouth at bedtime.    Yes Historical Provider, MD  polyethylene glycol (MIRALAX / GLYCOLAX) packet Take 17 g by mouth daily. States takes 1/3 packet at bedtime   Yes Historical Provider, MD  potassium gluconate 595 MG TABS Take 1,190 mg by mouth daily.    Yes Historical Provider, MD  predniSONE (DELTASONE) 10 MG tablet Take 1 tablet (10 mg total) by mouth daily. 07/28/14  Yes Laurey Morale, MD  propranolol (INDERAL) 40 MG tablet Take 40 mg by mouth at bedtime.   Yes Historical Provider, MD  rivaroxaban (XARELTO) 10 MG TABS tablet Take 1 tablet (10 mg total) by mouth  daily with breakfast. Take Xarelto for two and a half more weeks, then discontinue Xarelto. Once the patient has completed the blood thinner regimen, then take a Baby 81 mg Aspirin daily for three more weeks. 12/08/14  Yes Arlee Muslim, PA-C  tiZANidine (ZANAFLEX) 4 MG tablet Take 4 mg by mouth every 6 (six) hours as needed for muscle spasms.   Yes Historical Provider, MD  betamethasone valerate lotion (VALISONE) 0.1 % Apply topically 2 (two) times daily. 07/28/14   Laurey Morale, MD  Butalbital-APAP-Caffeine 50-300-40 MG CAPS Take 1 capsule by mouth 2 (two) times daily. 09/04/14   Laurey Morale, MD  diazepam (VALIUM) 5 MG tablet Take 1 tablet (5 mg total) by mouth every 6 (six) hours as needed for muscle spasms. 12/08/14   Arlee Muslim, PA-C  HYDROcodone-acetaminophen (NORCO/VICODIN) 5-325 MG per tablet Take 1-2 tablets by mouth every 4 (four) hours as needed for moderate pain. 12/08/14   Arlee Muslim, PA-C  hyoscyamine (LEVSIN SL) 0.125 MG SL tablet Place 1 tab on the tongue to dissolve at the at the onset of cramping, spasms then every 4 hours as needed. 07/28/14   Laurey Morale, MD  nitrofurantoin, macrocrystal-monohydrate, (MACROBID) 100 MG capsule Take 1 capsule (100 mg total) by mouth 2 (two) times daily. Patient not taking: Reported on 12/01/2014 07/27/14   Laurey Morale, MD  propranolol (INDERAL) 40 MG tablet TAKE ONE TABLET BY MOUTH DAILY. 07/28/14   Laurey Morale, MD  pseudoephedrine (SUDAFED) 120 MG 12 hr tablet Take 120 mg by mouth 2 (two) times daily.    Historical Provider, MD  pseudoephedrine-guaifenesin (MUCINEX D) 60-600 MG per tablet Take 1 tablet by mouth every 12 (twelve) hours.    Historical Provider, MD  traMADol (ULTRAM) 50 MG tablet Take 1-2 tablets (50-100 mg total) by mouth every 6 (six) hours as needed (mild pain). 12/08/14   Arlee Muslim, PA-C   BP 122/54 mmHg  Pulse 84  Temp(Src) 98.9 F (37.2 C) (Oral)  Resp 20  SpO2 99% Physical Exam  Constitutional: She is oriented to person,  place, and time. She appears well-developed and well-nourished. No distress.  HENT:  Head: Normocephalic and atraumatic.  Eyes: Conjunctivae and EOM are normal. Right eye exhibits no discharge. Left eye exhibits no discharge. No scleral icterus.  Neck: Normal range of motion. Neck supple. No tracheal deviation present.  Cardiovascular: Normal rate, regular rhythm and normal heart sounds.  Exam reveals no gallop and no friction rub.   No murmur heard. Pulmonary/Chest: Effort normal and breath sounds normal. No respiratory distress. She has no wheezes.  Abdominal: Soft. She exhibits no distension. There is no tenderness.  Musculoskeletal: Normal range of motion.  Lumbar paraspinal muscles tender to palpation, no bony tenderness, step-offs, or gross abnormality or deformity of spine, patient is able to ambulate, moves all extremities  Recent right knee TKA, ROM and strength limited 2/2 pain    Neurological: She is alert and oriented to person, place, and time.  Sensation and strength intact bilaterally   Skin: Skin is warm. She is not diaphoretic.  Well healing incision to right knee, no evidence of infection  Psychiatric: She has a normal mood and affect. Her behavior is normal. Judgment and thought content normal.  Nursing note and vitals reviewed.   ED Course  Procedures (including critical care time) Labs Review Labs Reviewed  URINALYSIS, ROUTINE W REFLEX MICROSCOPIC  CBC WITH DIFFERENTIAL/PLATELET  BASIC METABOLIC PANEL    Imaging Review No results found.   EKG Interpretation None      MDM   Final diagnoses:  None    Patient with acute on chronic back pain as well as postoperative right knee pain. The back pain is causing left-sided sciatica and pain in the left leg. Patient is unable compensate because of her recent right knee surgery. She is unable to ambulate. She has tried taking Dilaudid and  Valium with no relief. Patient seen by and discussed with Dr. Leonides Schanz, who  recommends aggressive pain control with Dilaudid and Valium here IV.  No evidence of cauda equina. No indication for imaging.  12:57 AM Patient signed out to [place], [place].  Plan: Dispo pending pain control.    Montine Circle, PA-C 12/19/14 [place], DO 12/19/14 [phone]

## 2014-12-19 NOTE — ED Notes (Signed)
Pt request tylenol for migraine. Pt continues to explain gets migraines as result of no sleep.

## 2014-12-19 NOTE — Consult Note (Signed)
Reason for Consult:   Intractable back pain with radiation into her left leg Referring Physician: ED Physician  Victoria Mosley is an 64 y.o. female.  HPI:    Victoria Mosley is a 64 y.o. 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 Maureen Ralphs in the office for follow-up on 12/17/2014 and was prescribed Hydromorphone Rx which improved her knee pain. She contacted the On-Call physician Dr. Lanice Schwab and was referred to the ED.   Her first back surgery was in 1993 for a ruptured disc.  She has had similar incidents since that time, but it has been suggested to her to not have additional surgeries, but to 'baby' the back.  She feels that since she has been babying the left side and back it has worn on the right knee. She also feels that do to the inability to decrease the stress on the left side it has flared.  She states that is normal in her use of the bedpan, with no additional complications. She has had this exact pain down the left leg previously, but it has been quite awhile.    Past Medical History  Diagnosis Date  . Menopause     sees Dr. Phineas Real  . 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 Migraines   . GERD (gastroesophageal reflux disease)   . IBS (irritable bowel syndrome)   . Arthritis   . Hyperlipidemia   . Diverticulitis   . UTI (urinary tract infection)     started atb on 03-24-12  . Bilateral knee pain     sees Dr. Wynelle Link   . Kidney stones     bilateral  . Nephrolithiasis     left  . Normal cardiac stress test 2012  . History of renal calculi   . Bilateral renal cysts   . Neurogenic bladder   . History of  melanoma excision   . Urinary urgency   . 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 Cough 11/30/14     cough since 11/20/14- now productine- green with green mucus out of nose- no fever  . Chronic UTI (urinary tract infection)     Past Surgical History  Procedure Laterality Date  . Melanoma excision  2007    left upper arm per dr. Teressa Senter  . Foot surgery Bilateral 1987    per Dr. Alphonzo Cruise  . Nasal septum surgery  1992    per Dr. Cynda Familia  . Lithotripsy  2007    x 3  . Lumbar laminectomy  1993    1993, at L5 per Dr. Quentin Cornwall  . Colonoscopy  03-28-12    per Dr. Henrene Pastor, polyp,  repeat in 5 yrs  . Cesarean section    . Tubal ligation    . Kidney stone surgery  2005    per Dr. Reece Agar  . Total hip arthroplasty Right 2010    [place]  . Posterior cervical laminectomy  07/08/2012    Procedure: POSTERIOR CERVICAL LAMINECTOMY;  Surgeon: Kristeen Miss, MD;  Location: [facility];  Service: Neurosurgery;  Laterality: N/A;  Cervical Seven- thoracic One Laminectomy and Decompression of Epidural Hematoma  . Hematoma evacuation  07/08/2012    Procedure: EVACUATION HEMATOMA;  Surgeon: Kristeen Miss, MD;  Location: [facility];  Service: Neurosurgery;  Laterality: N/A;  Cervical Seven-thoracic One Laminectomy and Decompression of Epidural Hematoma  . Cystoscopy with retrograde pyelogram, ureteroscopy and stent placement Left 12/25/2012    Procedure: CYSTOSCOPY WITH RETROGRADE PYELOGRAM, URETEROSCOPY, EXTRACTION OF  LEFT STONE WITH BASKETAND STENT PLACEMENT;  Surgeon: Molli Hazard, MD;  Location: [facility];  Service: Urology;  Laterality: Left;  2.5 HRS   . Total knee arthroplasty Right 12/07/2014    Procedure: RIGHT TOTAL KNEE ARTHROPLASTY;  Surgeon: [name], MD;  Location: [facility];  Service: Orthopedics;  Laterality: Right;    Family History  Problem Relation Age of Onset  . Heart disease Mother   . Hypertension Mother   . Diabetes Mother   .  Hypertension Father   . Leukemia Father   . Hypertension Sister   . Heart disease Sister   . Bladder Cancer Sister   . Stomach cancer Paternal Grandmother   . Colon cancer Neg Hx   . Esophageal cancer Neg Hx   . Rectal cancer Neg Hx     Social History:  reports that she quit smoking about 16 years ago. She has never used smokeless tobacco. She reports that she does not drink alcohol or use illicit drugs.  Allergies:  Allergies  Allergen Reactions  . Pravastatin Sodium [Pravachol]     Joint pain(severe)  . Biaxin [Clarithromycin] Nausea And Vomiting  . Ciprofloxacin Nausea Only  . Clarithromycin Nausea Only  . Levaquin [Levofloxacin In D5w] Other (See Comments)    Hot, dizziness, diarrhea     Results for orders placed or performed during the hospital encounter of 12/18/14 (from the past 48 hour(s))  CBC with Differential/Platelet     Status: Abnormal   Collection Time: 12/19/14 12:16 AM  Result Value Ref Range   WBC 12.7 (H) 4.0 - 10.5 K/uL   RBC 4.46 3.87 - 5.11 MIL/uL   Hemoglobin 12.8 12.0 - 15.0 g/dL   HCT 39.2 36.0 - 46.0 %   MCV 87.9 78.0 - 100.0 fL   MCH 28.7 26.0 - 34.0 pg   MCHC 32.7 30.0 - 36.0 g/dL   RDW 15.1 11.5 - 15.5 %   Platelets 391 150 - 400 K/uL   Neutrophils Relative % 69 43 - 77 %   Neutro Abs 8.8 (H) 1.7 - 7.7 K/uL   Lymphocytes Relative 16 12 - 46 %   Lymphs Abs 2.1 0.7 - 4.0 K/uL   Monocytes Relative 14 (H) 3 - 12 %   Monocytes Absolute 1.8 (H) 0.1 - 1.0 K/uL   Eosinophils Relative 1 0 - 5 %   Eosinophils Absolute 0.1 0.0 - 0.7 K/uL   Basophils Relative 0 0 - 1 %   Basophils Absolute 0.0 0.0 - 0.1 K/uL  Urinalysis, Routine w reflex microscopic     Status: Abnormal   Collection Time: 12/19/14 12:34 AM  Result Value Ref Range   Color, Urine YELLOW YELLOW   APPearance CLOUDY (A) CLEAR   Specific Gravity, Urine 1.019 1.005 - 1.030   pH 6.5 5.0 - 8.0   Glucose, UA NEGATIVE NEGATIVE mg/dL   Hgb urine dipstick NEGATIVE NEGATIVE   Bilirubin  Urine NEGATIVE NEGATIVE   Ketones, ur NEGATIVE NEGATIVE mg/dL   Protein, ur NEGATIVE NEGATIVE mg/dL   Urobilinogen, UA 0.2 0.0 -  1.0 mg/dL   Nitrite NEGATIVE NEGATIVE   Leukocytes, UA MODERATE (A) NEGATIVE  Urine microscopic-add on     Status: None   Collection Time: 12/19/14 12:34 AM  Result Value Ref Range   Squamous Epithelial / LPF RARE RARE   WBC, UA 11-20 <3 WBC/hpf   Bacteria, UA RARE RARE   Urine-Other AMORPHOUS URATES/PHOSPHATES   I-stat chem 8, ed     Status: Abnormal   Collection Time: 12/19/14  1:19 AM  Result Value Ref Range   Sodium 138 135 - 145 mmol/L   Potassium 3.4 (L) 3.5 - 5.1 mmol/L   Chloride 96 96 - 112 mmol/L   BUN 21 6 - 23 mg/dL   Creatinine, Ser 0.80 0.50 - 1.10 mg/dL   Glucose, Bld 129 (H) 70 - 99 mg/dL   Calcium, Ion 1.14 1.13 - 1.30 mmol/L   TCO2 29 0 - 100 mmol/L   Hemoglobin 14.3 12.0 - 15.0 g/dL   HCT 42.0 36.0 - 46.0 %    No results found.  Review of Systems  Constitutional: Negative.   HENT: Negative.   Eyes: Negative.   Respiratory: Negative.   Cardiovascular: Negative.   Gastrointestinal: Positive for heartburn.  Genitourinary: Negative.   Musculoskeletal: Positive for back pain and joint pain.  Skin: Negative.   Neurological: Negative.   Endo/Heme/Allergies: Negative.   Psychiatric/Behavioral: Negative.    Blood pressure 123/61, pulse 73, temperature 98.9 F (37.2 C), temperature source Oral, resp. rate 18, SpO2 95 %. Physical Exam  Constitutional: She is oriented to person, place, and time. She appears well-developed and well-nourished.  HENT:  Head: Normocephalic and atraumatic.  Eyes: Pupils are equal, round, and reactive to light.  Neck: Neck supple. No JVD present. No tracheal deviation present. No thyromegaly present.  Cardiovascular: Normal rate, regular rhythm and intact distal pulses.   Respiratory: Effort normal and breath sounds normal. No respiratory distress. She has no wheezes.  GI: Soft. There is no tenderness.  There is no guarding.  Musculoskeletal:       Right knee: She exhibits decreased range of motion, swelling, laceration (insicion from surgery looks to be healing well with no drainage or signs of infection ) and bony tenderness. She exhibits no ecchymosis and no erythema. Tenderness found.       Lumbar back: She exhibits decreased range of motion, tenderness, bony tenderness and pain.  Neurological: She is alert and oriented to person, place, and time.  Skin: Skin is warm and dry.  Psychiatric: She has a normal mood and affect.    Assessment/Plan: Intractable back pain with radiation into her left leg   Current treatments appear to be working well, as she is already in less pain then when she arrived to the ER. Continue with analgesic medication and muscle relaxer May add prednisone to help decrease inflammation / pain PT consult for continued work on the right knee May follow up with her Neurosurgeon Dr. Ellene Route if she has additional problems with her back (patient's preference) Follow up with Dr. Wynelle Link as previously scheduled.     Pricilla Loveless 12/19/2014, 10:11 AM

## 2014-12-19 NOTE — ED Notes (Signed)
Pt's bed changing to room 1608.  Reported to Maudie Mercury, Agricultural consultant who will assume care.

## 2014-12-19 NOTE — H&P (Signed)
[facility] Admission History and Physical       Victoria Mosley AYT:[id number] DOB: Mar 06, 1951 DOA: 12/18/2014  Referring physician: EDP PCP: Victoria Morale, MD  Specialists: Orthopedics:   Chief Complaint: Increased low Back Pain  HPI: Victoria Mosley is a 64 y.o. female with a history of recent Right TKR on 12/08/2014 who presents to the ED with complaints of increased low back pain radiating into her left leg since 4 pm.    She reports that she has chronic pain from lumbar DDD and that her pain was increased after having physical therapy during the day.   She reports not having adequate pain control following her knee surgery with Vicodin Rx, and saw Dr. Maureen Mosley in the office for follow-up on 12/17/2014 and was prescribed Hydromorphone Rx which improved her knee pain.    She contacted the On-Call physician Dr.  Lanice Mosley and was referred to the ED.      Review of Systems:  Constitutional: No Weight Loss, No Weight Gain, Night Sweats, Fevers, Chills, Dizziness, Light Headedness, Fatigue, or Generalized Weakness HEENT: No Headaches, Difficulty Swallowing,Tooth/Dental Problems,Sore Throat,  No Sneezing, Rhinitis, Ear Ache, Nasal Congestion, or Post Nasal Drip,  Cardio-vascular:  No Chest pain, Orthopnea, PND, Edema in Lower Extremities, Anasarca, Dizziness, Palpitations  Resp: No Dyspnea, No DOE, No Productive Cough, No Non-Productive Cough, No Hemoptysis, No Wheezing.    GI: No Heartburn, Indigestion, Abdominal Pain, Nausea, Vomiting, Diarrhea, Constipation, Hematemesis, Hematochezia, Melena, Change in Bowel Habits,  Loss of Appetite  GU: No Dysuria, No Change in Color of Urine, No Urgency or Urinary Frequency, No Flank pain.  Musculoskeletal:  No Joint Pain or Swelling, No Decreased Range of Motion, +Low Back Pain.  Neurologic: No Syncope, No Seizures, Muscle Weakness, Paresthesia, Vision Disturbance or Loss, No Diplopia, No Vertigo, +Sciatica LLE, +Difficulty Walking,  Skin: No  Rash or Lesions. Psych: No Change in Mood or Affect, No Depression or Anxiety, No Memory loss, No Confusion, or Hallucinations   Past Medical History  Diagnosis Date  . Menopause     sees Dr. Phineas Mosley  . 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 Allyson Mosley   . Migraines   . GERD (gastroesophageal reflux disease)   . IBS (irritable bowel syndrome)   . Arthritis   . Hyperlipidemia   . Diverticulitis   . UTI (urinary tract infection)     started atb on 03-24-12  . Bilateral knee pain     sees Dr. Wynelle Mosley   . Kidney stones     bilateral  . Nephrolithiasis     left  . Normal cardiac stress test 2012  . History of renal calculi   . Bilateral renal cysts   . Neurogenic bladder   . History of melanoma excision   . Urinary urgency   . 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 Cough 11/30/14     cough since 11/20/14- now productine- green with green mucus out of nose- no fever  . Chronic UTI (urinary tract infection)      Past Surgical History  Procedure Laterality Date  . Melanoma excision  2007    left upper arm per dr. Teressa Senter  . Foot surgery Bilateral 1987    per Dr. Alphonzo Cruise  . Nasal septum surgery  1992    per Dr. Cynda Familia  . Lithotripsy  2007    x 3  . Lumbar laminectomy  1993    1993, at  L5 per Dr. Quentin Cornwall  . Colonoscopy  03-28-12    per Dr. Henrene Pastor, polyp,  repeat in 5 yrs  . Cesarean section    . Tubal ligation    . Kidney stone surgery  2005    per Dr. Reece Agar  . Total hip arthroplasty Right 2010    [place]  . Posterior cervical laminectomy  07/08/2012    Procedure: POSTERIOR CERVICAL LAMINECTOMY;  Surgeon: Kristeen Miss, MD;  Location: [facility];  Service: Neurosurgery;  Laterality: N/A;  Cervical Seven- thoracic One Laminectomy and Decompression of Epidural Hematoma  . Hematoma evacuation  07/08/2012    Procedure: EVACUATION HEMATOMA;  Surgeon: Kristeen Miss, MD;  Location: [facility];  Service: Neurosurgery;  Laterality: N/A;  Cervical  Seven-thoracic One Laminectomy and Decompression of Epidural Hematoma  . Cystoscopy with retrograde pyelogram, ureteroscopy and stent placement Left 12/25/2012    Procedure: CYSTOSCOPY WITH RETROGRADE PYELOGRAM, URETEROSCOPY, EXTRACTION OF  LEFT STONE WITH BASKETAND STENT PLACEMENT;  Surgeon: Molli Hazard, MD;  Location: [facility];  Service: Urology;  Laterality: Left;  2.5 HRS   . Total knee arthroplasty Right 12/07/2014    Procedure: RIGHT TOTAL KNEE ARTHROPLASTY;  Surgeon: [name], MD;  Location: [facility];  Service: Orthopedics;  Laterality: Right;      Prior to Admission medications   Medication Sig Start Date End Date Taking? Authorizing Provider  diphenhydrAMINE (BENADRYL) 25 mg capsule Take 25 mg by mouth at bedtime.     Yes Historical Provider, MD  estropipate (OGEN) 0.75 MG tablet Take 0.75 mg by mouth daily.   Yes Historical Provider, MD  hydrochlorothiazide (HYDRODIURIL) 25 MG tablet Take 1 tablet (25 mg total) by mouth daily. 07/28/14  Yes Victoria Morale, MD  HYDROmorphone (DILAUDID) 4 MG tablet Take 4 mg by mouth every 4 (four) hours as needed for severe pain.   Yes Historical Provider, MD  loratadine (CLARITIN) 10 MG tablet Take 10 mg by mouth daily. For allergies   Yes Historical Provider, MD  medroxyPROGESTERone (PROVERA) 2.5 MG tablet Take 2.5 mg by mouth daily.   Yes Historical Provider, MD  omeprazole (PRILOSEC) 20 MG capsule TAKE ONE CAPSULE BY MOUTH ONCE DAILY 11/02/14  Yes Victoria Morale, MD  omeprazole (PRILOSEC) 20 MG capsule Take 20 mg by mouth at bedtime.    Yes Historical Provider, MD  polyethylene glycol (MIRALAX / GLYCOLAX) packet Take 17 g by mouth daily. States takes 1/3 packet at bedtime   Yes Historical Provider, MD  potassium gluconate 595 MG TABS Take 1,190 mg by mouth daily.    Yes Historical Provider, MD  predniSONE (DELTASONE) 10 MG tablet Take 1 tablet (10 mg total) by mouth daily. 07/28/14  Yes Victoria Morale, MD  propranolol  (INDERAL) 40 MG tablet Take 40 mg by mouth at bedtime.   Yes Historical Provider, MD  rivaroxaban (XARELTO) 10 MG TABS tablet Take 1 tablet (10 mg total) by mouth daily with breakfast. Take Xarelto for two and a half more weeks, then discontinue Xarelto. Once the patient has completed the blood thinner regimen, then take a Baby 81 mg Aspirin daily for three more weeks. 12/08/14  Yes Arlee Muslim, PA-C  tiZANidine (ZANAFLEX) 4 MG tablet Take 4 mg by mouth every 6 (six) hours as needed for muscle spasms.   Yes Historical Provider, MD  betamethasone valerate lotion (VALISONE) 0.1 % Apply topically 2 (two) times daily. 07/28/14   Victoria Morale, MD  Butalbital-APAP-Caffeine 50-300-40 MG CAPS Take  1 capsule by mouth 2 (two) times daily. 09/04/14   Victoria Morale, MD  diazepam (VALIUM) 5 MG tablet Take 1 tablet (5 mg total) by mouth every 6 (six) hours as needed for muscle spasms. 12/08/14   Arlee Muslim, PA-C  HYDROcodone-acetaminophen (NORCO/VICODIN) 5-325 MG per tablet Take 1-2 tablets by mouth every 4 (four) hours as needed for moderate pain. 12/08/14   Arlee Muslim, PA-C  hyoscyamine (LEVSIN SL) 0.125 MG SL tablet Place 1 tab on the tongue to dissolve at the at the onset of cramping, spasms then every 4 hours as needed. 07/28/14   Victoria Morale, MD  nitrofurantoin, macrocrystal-monohydrate, (MACROBID) 100 MG capsule Take 1 capsule (100 mg total) by mouth 2 (two) times daily. Patient not taking: Reported on 12/01/2014 07/27/14   Victoria Morale, MD  propranolol (INDERAL) 40 MG tablet TAKE ONE TABLET BY MOUTH DAILY. 07/28/14   Victoria Morale, MD  pseudoephedrine (SUDAFED) 120 MG 12 hr tablet Take 120 mg by mouth 2 (two) times daily.    Historical Provider, MD  pseudoephedrine-guaifenesin (MUCINEX D) 60-600 MG per tablet Take 1 tablet by mouth every 12 (twelve) hours.    Historical Provider, MD  traMADol (ULTRAM) 50 MG tablet Take 1-2 tablets (50-100 mg total) by mouth every 6 (six) hours as needed (mild pain). 12/08/14    Arlee Muslim, PA-C     Allergies  Allergen Reactions  . Pravastatin Sodium [Pravachol]     Joint pain(severe)  . Biaxin [Clarithromycin] Nausea And Vomiting  . Ciprofloxacin Nausea Only  . Clarithromycin Nausea Only  . Levaquin [Levofloxacin In D5w] Other (See Comments)    Hot, dizziness, diarrhea    Social History:  reports that she quit smoking about 16 years ago. She has never used smokeless tobacco. She reports that she does not drink alcohol or use illicit drugs.    Family History  Problem Relation Age of Onset  . Heart disease Mother   . Hypertension Mother   . Diabetes Mother   . Hypertension Father   . Leukemia Father   . Hypertension Sister   . Heart disease Sister   . Bladder Cancer Sister   . Stomach cancer Paternal Grandmother   . Colon cancer Neg Hx   . Esophageal cancer Neg Hx   . Rectal cancer Neg Hx        Physical Exam:  GEN:  Pleasant Obese 64 y.o. Caucasian female examined and in Discomfort but no acute distress; cooperative with exam Filed Vitals:   12/18/14 2309 12/19/14 0354 12/19/14 0400 12/19/14 0500  BP: 122/54 113/62 108/56 107/52  Pulse: 84 84 82 77  Temp: 98.9 F (37.2 C)     TempSrc: Oral     Resp: 20 18    SpO2: 99% 99% 90% 90%   Blood pressure 107/52, pulse 77, temperature 98.9 F (37.2 C), temperature source Oral, resp. rate 18, SpO2 90 %. PSYCH: She is alert and oriented x4; does not appear anxious does not appear depressed; affect is normal HEENT: Normocephalic and Atraumatic, Mucous membranes pink; PERRLA; EOM intact; Fundi:  Benign;  No scleral icterus, Nares: Patent, Oropharynx: Clear, Fair Dentition,    Neck:  FROM, No Cervical Lymphadenopathy nor Thyromegaly or Carotid Bruit; No JVD; Breasts:: Not examined CHEST WALL: No tenderness CHEST: Normal respiration, clear to auscultation bilaterally HEART: Regular rate and rhythm; no murmurs rubs or gallops BACK: No kyphosis or scoliosis; No CVA tenderness ABDOMEN: Positive Bowel  Sounds, Obese, Soft Non-Tender, No Rebound or  Guarding; No Masses, No Organomegaly, No Pannus; No Intertriginous candida. Rectal Exam: Not done EXTREMITIES: +Midline Surgical incision on Right suprapatellar Area to infrapatellar Area without Signs of infecIon,  Steri-Strips intact; LLE:  No Cyanosis, Clubbing, or Edema; No Ulcerations. Genitalia: not examined PULSES: 2+ and symmetric SKIN: Normal hydration no rash or ulceration CNS:  Alert and Oriented x 4, No Focal Deficits Vascular: pulses palpable throughout    Labs on Admission:  Basic Metabolic Panel:  Recent Labs Lab 12/19/14 0119  NA 138  K 3.4*  CL 96  GLUCOSE 129*  BUN 21  CREATININE 0.80   Liver Function Tests: No results for input(s): AST, ALT, ALKPHOS, BILITOT, PROT, ALBUMIN in the last 168 hours. No results for input(s): LIPASE, AMYLASE in the last 168 hours. No results for input(s): AMMONIA in the last 168 hours. CBC:  Recent Labs Lab 12/19/14 0016 12/19/14 0119  WBC 12.7*  --   NEUTROABS 8.8*  --   HGB 12.8 14.3  HCT 39.2 42.0  MCV 87.9  --   PLT 391  --    Cardiac Enzymes: No results for input(s): CKTOTAL, CKMB, CKMBINDEX, TROPONINI in the last 168 hours.  BNP (last 3 results) No results for input(s): BNP in the last 8760 hours.  ProBNP (last 3 results) No results for input(s): PROBNP in the last 8760 hours.  CBG: No results for input(s): GLUCAP in the last 168 hours.  Radiological Exams on Admission: No results found.     Assessment/Plan:   64 y.o. female with  Principal Problem:   1.   Intractable back pain/Sciatica of left side/DDD (degenerative disc disease), lumbar   Due to Stress From #2   Pain Control with IV Dilaudid   Add IV Robaxin   Orthopedics to See in AM   May need Neurosurgical Consultation   Active Problems:   2.   S/P total knee arthroplasty- surgery on 12/08/2014   Pain Control as outlined in #1   Physical therapy as outpatient     3   Migraines-   continue  Propanolol Rx   Fioricet PRN     4.   Fever- Self Reported   Monitor    Send Blood Cultures and Urine Cultures if recurrence     5.   GERD   On PPI Rx     6.   DVT Prophylaxis    On Eliquis Rx x 2 weeks then to transition to ASA Rx x 3 weeks      Code Status:   FULL CODE Family Communication:   Husband at Bedside Disposition Plan:   Observation Med/surg Unit   Time spent:  [place] C [facility] Pager [phone]   If Ethan Please Contact the Day Rounding Team MD for [facility]  If 7PM-7AM, Please Contact Night-Floor Coverage  [email or web address] Password [email or web address] 12/19/2014, 5:54 AM     ADDENDUM:   Patient was seen and examined on 12/19/2014

## 2014-12-19 NOTE — ED Notes (Addendum)
Patient continually repeating herself and complaining about pain.  She refuses to try to walk.  Reports she'd like to use a bedpan.

## 2014-12-19 NOTE — ED Notes (Signed)
Call placed to 6th floor to give report.  Informed that the bed number is changing.  Charge RN to call back for report when she is done in current pt's room

## 2014-12-19 NOTE — ED Notes (Signed)
Patient reports that she is unable to stand until she has had more medication.

## 2014-12-19 NOTE — ED Notes (Signed)
Paged with new bed assignment at 10:13

## 2014-12-19 NOTE — ED Provider Notes (Signed)
0515 - Patient care assumed from Montine Circle, PA-C at shift change. Patient presented to the emergency department for worsening low back pain and spasms causing left-sided sciatica. 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 Suspect that her sciatica was worsened as a result of compensating with her left side following a total right knee arthroplasty by Dr. Maureen Ralphs on 12/07/2014.  Patient treated in ED with Dilaudid 2, Toradol, and morphine. Patient is more comfortable, but still in a degree of discomfort. Patient lives at home with her husband who also has back issues. He states he did not feel comfortable helping the patient transition when she was in episodes of acute pain and was concerned that she would fall. Patient had attempted managing her symptoms at home with muscle relaxers and hydromorphone tablets. She also was ambulating with a walker and had a lift chair at home, but was still unable to transition to the bedside commode secondary to pain.   Given persistent pain and degree of symptoms, feel patient would benefit from admission for further pain management as well as, potential, coordination for placement in a short term rehab facility. Patient case discussed with Dr. Rolena Infante of [place] who believes hospitalist admission would be most appropriate. Case discussed with Dr. Arnoldo Morale of [facility] who will admit. Temp admit orders placed.   Filed Vitals:   12/18/14 2309 12/19/14 0354 12/19/14 0400 12/19/14 0500  BP: 122/54 113/62 108/56 107/52  Pulse: 84 84 82 77  Temp: 98.9 F (37.2 C)     TempSrc: Oral     Resp: 20 18    SpO2: 99% 99% 90% 90%     Antonietta Breach, PA-C 12/19/14 [phone]

## 2014-12-20 DIAGNOSIS — K210 Gastro-esophageal reflux disease with esophagitis: Secondary | ICD-10-CM

## 2014-12-20 DIAGNOSIS — M5432 Sciatica, left side: Secondary | ICD-10-CM

## 2014-12-20 LAB — CBC
HCT: 34.5 % — ABNORMAL LOW (ref 36.0–46.0)
Hemoglobin: 10.9 g/dL — ABNORMAL LOW (ref 12.0–15.0)
MCH: 28.3 pg (ref 26.0–34.0)
MCHC: 31.6 g/dL (ref 30.0–36.0)
MCV: 89.6 fL (ref 78.0–100.0)
Platelets: 300 10*3/uL (ref 150–400)
RBC: 3.85 MIL/uL — ABNORMAL LOW (ref 3.87–5.11)
RDW: 15.1 % (ref 11.5–15.5)
WBC: 8.8 10*3/uL (ref 4.0–10.5)

## 2014-12-20 LAB — BASIC METABOLIC PANEL
Anion gap: 6 (ref 5–15)
BUN: 14 mg/dL (ref 6–23)
CO2: 29 mmol/L (ref 19–32)
Calcium: 8.5 mg/dL (ref 8.4–10.5)
Chloride: 106 mmol/L (ref 96–112)
Creatinine, Ser: 0.59 mg/dL (ref 0.50–1.10)
GFR calc Af Amer: >90 mL/min (ref >90)
GFR calc non Af Amer: >90 mL/min (ref >90)
Glucose, Bld: 123 mg/dL — ABNORMAL HIGH (ref 70–99)
Potassium: 4.3 mmol/L (ref 3.5–5.1)
Sodium: 141 mmol/L (ref 135–145)

## 2014-12-20 LAB — URINE CULTURE: Colony Count: 2000

## 2014-12-20 MED ORDER — BISACODYL 10 MG RE SUPP
10.0000 mg | Freq: Once | RECTAL | Status: AC
  Administered 2014-12-21: 10 mg via RECTAL
  Filled 2014-12-20: qty 1

## 2014-12-20 MED ORDER — SENNA 8.6 MG PO TABS
2.0000 | ORAL_TABLET | Freq: Once | ORAL | Status: AC
  Administered 2014-12-20: 17.2 mg via ORAL

## 2014-12-20 NOTE — Progress Notes (Signed)
   [facility] PROGRESS NOTE Assessment/Plan: S/P total knee arthroplasty - Continue narcotics for pain reconsult physical therapy.  Intractable back pain/Sciatica of left side: - Continue oral narcotics and IV narcotics. - short course of steroids - Having regular BM.  Migraines - control.   Code Status: full Family Communication: husband  Disposition Plan: obervation   Consultants:  Ortho  Procedures:  none  Antibiotics:  None  HPI/Subjective: She relates she cont to have pain  Objective: Filed Vitals:   12/19/14 0753 12/19/14 1419 12/19/14 2030 12/20/14 0550  BP: 123/61 103/52 125/64 112/61  Pulse: 73 85 90 77  Temp:  98.6 F (37 C) 99.2 F (37.3 C) 98 F (36.7 C)  TempSrc:  Oral Oral Oral  Resp: 18 18 20 20   SpO2: 95% 95% 97% 95%    Intake/Output Summary (Last 24 hours) at 12/20/14 1036 Last data filed at 12/20/14 2536  Gross per 24 hour  Intake    600 ml  Output   1525 ml  Net   -925 ml   There were no vitals filed for this visit.  Exam:  General: Alert, awake, oriented x3, in no acute distress.  HEENT: No bruits, no goiter.  Heart: Regular rate and rhythm. Lungs: Good air movement, clear Abdomen: Soft, nontender, nondistended, positive bowel sounds.  Neuro: Grossly intact, nonfocal.   Data Reviewed: Basic Metabolic Panel:  Recent Labs Lab 12/19/14 0119 12/20/14 0448  NA 138 141  K 3.4* 4.3  CL 96 106  CO2  --  29  GLUCOSE 129* 123*  BUN 21 14  CREATININE 0.80 0.59  CALCIUM  --  8.5   Liver Function Tests: No results for input(s): AST, ALT, ALKPHOS, BILITOT, PROT, ALBUMIN in the last 168 hours. No results for input(s): LIPASE, AMYLASE in the last 168 hours. No results for input(s): AMMONIA in the last 168 hours. CBC:  Recent Labs Lab 12/19/14 0016 12/19/14 0119 12/20/14 0448  WBC 12.7*  --  8.8  NEUTROABS 8.8*  --   --   HGB 12.8 14.3 10.9*  HCT 39.2 42.0 34.5*  MCV 87.9  --  89.6  PLT 391  --  300    Cardiac Enzymes: No results for input(s): CKTOTAL, CKMB, CKMBINDEX, TROPONINI in the last 168 hours. BNP (last 3 results) No results for input(s): BNP in the last 8760 hours.  ProBNP (last 3 results) No results for input(s): PROBNP in the last 8760 hours.  CBG: No results for input(s): GLUCAP in the last 168 hours.  No results found for this or any previous visit (from the past 240 hour(s)).   Studies: No results found.  Scheduled Meds: . butalbital-acetaminophen-caffeine  1 tablet Oral BID  . loratadine  10 mg Oral Daily  . nitrofurantoin (macrocrystal-monohydrate)  100 mg Oral BID  . pantoprazole  40 mg Oral Daily  . predniSONE  40 mg Oral Q breakfast  . propranolol  40 mg Oral QHS  . rivaroxaban  10 mg Oral Q breakfast  . zolpidem  5 mg Oral QHS   Continuous Infusions: . sodium chloride 50 mL/hr at 12/19/14 [place], Ranen D[place]in  [facility] Pager [phone]. If 8PM-8AM, please contact night-coverage at [email or web address], password [facility] 12/20/2014, 10:36 AM  LOS: 1 day

## 2014-12-20 NOTE — Progress Notes (Signed)
Physical Therapy Treatment Patient Details Name: Victoria Mosley MRN: [id number] DOB: October 06, 1951 Today's Date: 12/20/2014    History of Present Illness Pt was doing well s/p RTKA about 1 week ago with HHPT however very suddenly felt radiating pain down L LE (from previous disc problems) and had great difficult and inabilit to get out of chair or ambulate.     PT Comments    Pt tolerated session well. Still very weak in quad and about 50% quad activity during exercises. Challenged with transfers and keeping back in safe position as well. Feel pt to benefit from [facility] for rehab in order to increase strength ROM and safety with all mobility.   Follow Up Recommendations  SNF     Equipment Recommendations  None recommended by PT (pt has equipment)    Recommendations for Other Services OT consult     Precautions / Restrictions Precautions Precautions: Knee;Back Restrictions Weight Bearing Restrictions: Yes RLE Weight Bearing: Weight bearing as tolerated    Mobility  Bed Mobility Overal bed mobility: Needs Assistance Bed Mobility: Sit to Sidelying   Sidelying to sit: Mod assist       General bed mobility comments: continued to educate on sidelyign first to help ldecrease teh back pain.   Transfers Overall transfer level: Needs assistance Equipment used: Rolling walker (2 wheeled) Transfers: Sit to/from Stand Sit to Stand: +2 physical assistance;Mod assist Stand pivot transfers: Mod assist (to rise from elevated bed, legs are very weak )       General transfer comment: difficult to get pt to rise off surface. Weak in LEs and required a lot of assistance.   Ambulation/Gait Ambulation/Gait assistance: Min assist Ambulation Distance (Feet): 60 Feet Assistive device: Rolling walker (2 wheeled) Gait Pattern/deviations: Step-to pattern     General Gait Details: slow guraded , tolerated well.    Stairs            Wheelchair Mobility    Modified Rankin (Stroke  Patients Only)       Balance Overall balance assessment: No apparent balance deficits (not formally assessed)                                  Cognition Arousal/Alertness: Awake/alert Behavior During Therapy: WFL for tasks assessed/performed Overall Cognitive Status: Within Functional Limits for tasks assessed                      Exercises Total Joint Exercises Ankle Circles/Pumps: Supine;Right;AROM;10 reps Quad Sets: AROM;Supine;10 reps;Right Short Arc QuadSinclair Ship;Right;10 reps;Supine (about 50% of quad kickign in to activity) Heel Slides: AAROM;Right;Supine;10 reps Straight Leg Raises: AAROM;Supine;10 reps (eduated on a lot of relaxation techniques and breathing. )    General Comments        Pertinent Vitals/Pain Pain Assessment: 0-10 Pain Score: 4  Pain Location: R knee and some downt he back on the LLE  Pain Descriptors / Indicators: Aching;Cramping Pain Intervention(s): Monitored during session;Premedicated before session;Ice applied (pt has some cramping in all parts of her  both legs periodic)    Home Living Family/patient expects to be discharged to:: Private residence Living Arrangements: Spouse/significant other Available Help at Discharge: Family Type of Home: House Home Access: Stairs to enter   Home Layout: One level Home Equipment: [place] - single point;Bedside commode;Walker - 2 wheels;Tub bench Additional Comments: reacher, lift chair    Prior Function Level of Independence: Independent with assistive device(s)  Comments: priro to RTKA , was independent, since then progressign with RW and HHPT s/p knee surgery.    PT Goals (current goals can now be found in the care plan section) Acute Rehab PT Goals Patient Stated Goal: to get back on track again. I thin the back throw everything off PT Goal Formulation: With patient Time For Goal Achievement: 01/03/15 Potential to Achieve Goals: Good    Frequency  Min 5X/week    PT  Plan Discharge plan needs to be updated    Co-evaluation             End of Session Equipment Utilized During Treatment: Gait belt Activity Tolerance: Patient tolerated treatment well Patient left: with call bell/phone within reach;with family/visitor present (pt left on commode at pts request with husband monitoring)     Time: 3419-6222 PT Time Calculation (min) (ACUTE ONLY): 30 min  Charges:  $Gait Training: 8-22 mins $Therapeutic Exercise: 8-22 mins $Therapeutic Activity: 8-22 mins                    G Codes:  Functional Assessment Tool Used: clincial judgement  Functional Limitation: Mobility: Walking and moving around Mobility: Walking and Moving Around Current Status ([id number]): At least 20 percent but less than 40 percent impaired, limited or restricted Mobility: Walking and Moving Around Goal Status [phone]): At least 1 percent but less than 20 percent impaired, limited or restricted   Clide Dales 12/20/2014, 4:10 PM  Clide Dales, PT Pager: [phone] 12/20/2014

## 2014-12-20 NOTE — Progress Notes (Signed)
UR completed 

## 2014-12-20 NOTE — Progress Notes (Signed)
Subjective: Seen and examined this am. She has a many of complaints.  Reports back pain and left sided radicular symptoms.  Right and left knee pain. Dr. Eula Listen recently performed a right TKA. Tolerating PO's well.  No bowel or bladder changes. Denies CP, SOB, or calf pain.  Objective: Vital signs in last 24 hours: Temp:  [98 F (36.7 C)-99.2 F (37.3 C)] 98 F (36.7 C) (02/21 0550) Pulse Rate:  [77-90] 77 (02/21 0550) Resp:  [18-20] 20 (02/21 0550) BP: (103-125)/(52-64) 112/61 mmHg (02/21 0550) SpO2:  [95 %-97 %] 95 % (02/21 0550)  Intake/Output from previous day: 02/20 0701 - 02/21 0700 In: 840 [P.O.:840] Out: 1725 [Urine:1725] Intake/Output this shift: Total I/O In: -  Out: 200 [Urine:200]   Recent Labs  12/19/14 0016 12/19/14 0119 12/20/14 0448  HGB 12.8 14.3 10.9*    Recent Labs  12/19/14 0016 12/19/14 0119 12/20/14 0448  WBC 12.7*  --  8.8  RBC 4.46  --  3.85*  HCT 39.2 42.0 34.5*  PLT 391  --  300    Recent Labs  12/19/14 0119 12/20/14 0448  NA 138 141  K 3.4* 4.3  CL 96 106  CO2  --  29  BUN 21 14  CREATININE 0.80 0.59  GLUCOSE 129* 123*  CALCIUM  --  8.5   No results for input(s): LABPT, INR in the last 72 hours.  Well nourished. Alert and oriented x3. RRR, Lungs clear, BS x4. Abdomen soft and non tender. Right Calf soft and non tender. Right knee dressing C/D/I. No DVT signs. Compartment soft. No signs of infection.  Right LE neurovascular intact.  No edema of lower back region. Pt can plantar and dorsi flex both ankles. Sensation to touch in BLE intact. 2+ pedal pulses noted bilateral.   Assessment/Plan: irtractable Back Pain: Up with PT Continue prednisone Pain controll F/u with Dr. Ellene Route.   S/p Right TKA Follow up with Dr. Eula Listen in am Up with PT    Wyatt Portela L 12/20/2014, 10:07 AM

## 2014-12-20 NOTE — Clinical Social Work Placement (Addendum)
[facility] Work Department CLINICAL SOCIAL WORK PLACEMENT NOTE 12/20/2014  Patient:  Victoria Mosley, Victoria Mosley  Account Number:  [id number] [place] date:  12/18/2014  Clinical Social Worker:  Dede Query, CLINICAL SOCIAL WORKER  Date/time:  12/20/2014 02:52 PM  Clinical Social Work is seeking post-discharge placement for this patient at the following level of care:   SKILLED NURSING   (*CSW will update this form in Epic as items are completed)   12/20/2014  Patient/family provided with [place] Department of Clinical Social Work's list of facilities offering this level of care within the geographic area requested by the patient (or if unable, by the patient's family).  12/20/2014  Patient/family informed of their freedom to choose among providers that offer the needed level of care, that participate in Medicare, Medicaid or managed care program needed by the patient, have an available bed and are willing to accept the patient.  12/20/2014  Patient/family informed of [facility]' ownership interest in [facility], as well as of the fact that they are under no obligation to receive care at this facility.  PASARR submitted to EDS on 12/20/2014 PASARR number received on 12/20/2014  FL2 transmitted to all facilities in geographic area requested by pt/family on  12/20/2014 FL2 transmitted to all facilities within larger geographic area on   Patient informed that his/her managed care company has contracts with or will negotiate with  certain facilities, including the following:     Patient/family informed of bed offers received:  12/21/2014 Patient chooses bed at [facility] and [place] recommends and patient chooses bed at    Patient to be transferred to  on  [facility] on 12/21/2014 Patient to be transferred to facility by pt husband via private vehicle Patient and family notified of transfer on 12/21/2014 Name of family member notified:  Pt and  pt husband, Victoria Mosley at bedside.  The following physician request were entered in Epic:   Additional Comments:  .Dede Query, [place] Worker - Weekend Coverage cell #: [phone]  Alison Murray, MSW, LCSW Clinical Social Worker Coverage for Werner Lean, [place] [phone]

## 2014-12-20 NOTE — Evaluation (Signed)
Physical Therapy Evaluation Patient Details Name: Victoria Mosley MRN: [id number] DOB: 1951/01/01 Today's Date: 12/20/2014   History of Present Illness  Pt was doing well s/p RTKA about 1 week ago with HHPT however very suddenly felt radiating pain down L LE (from previous disc problems) and had great difficult and inabilit to get out of chair or ambulate.   Clinical Impression  Pt tolerated session well, however still required great assist to rise for transfer. Pt to benefit from PT to continue to progress to level of minA or less in order for husband to safely care for pt at home. If she cannot consistently perform at this level, she may require ST SNF for rehab before returning home.      Follow Up Recommendations Home health PT (if pt does not progress well, recommend ST SNF, will continue to assess.)    Equipment Recommendations  None recommended by PT (pt has equipment)    Recommendations for Other Services       Precautions / Restrictions Precautions Precautions: Knee;Back Restrictions Weight Bearing Restrictions: Yes RLE Weight Bearing: Weight bearing as tolerated      Mobility  Bed Mobility Overal bed mobility: Needs Assistance Bed Mobility: Sidelying to Sit   Sidelying to sit: Mod assist       General bed mobility comments: educated to rol to side a little more to get OOB to help minimize strain on back area. ?Had to cue pt and assist RLE through entire movment and helpw tih upper boday off bed. Pt reports she has been sleeping in the recliner at home.   Transfers Overall transfer level: Needs assistance Equipment used: Rolling walker (2 wheeled) Transfers: Sit to/from Stand Sit to Stand: +2 physical assistance;Mod assist         General transfer comment: difficult to get pt to rise off surface. Weak in LEs and required a lot of assistance.   Ambulation/Gait Ambulation/Gait assistance: Min assist Ambulation Distance (Feet): 50 Feet Assistive device:  Rolling walker (2 wheeled) Gait Pattern/deviations: Step-to pattern     General Gait Details: slow guraded , tolerated well.   Stairs            Wheelchair Mobility    Modified Rankin (Stroke Patients Only)       Balance Overall balance assessment: No apparent balance deficits (not formally assessed)                                           Pertinent Vitals/Pain Pain Assessment: 0-10 Pain Score: 5  Pain Location: R knee and back of L leg especially upon standing radiatied down back on L leg, however reported it was little less than the ofher day for she could stand it Pain Descriptors / Indicators: Aching;Burning Pain Intervention(s): Monitored during session;RN gave pain meds during session;Ice applied    Home Living Family/patient expects to be discharged to:: Private residence Living Arrangements: Spouse/significant other Available Help at Discharge: Family Type of Home: House Home Access: Stairs to enter   [employer] of Steps: one threshold step to enter Home Layout: One level Home Equipment: Cane - single point;Bedside commode;Walker - 2 wheels;Tub bench Additional Comments: reacher, lift chair    Prior Function Level of Independence: Independent with assistive device(s)         Comments: priro to RTKA , was independent, since then progressign with RW and HHPT s/p knee  surgery.      Hand Dominance        Extremity/Trunk Assessment   Upper Extremity Assessment: Defer to OT evaluation           Lower Extremity Assessment: Generalized weakness RLE Deficits / Details: grossly 2+/5 for knee extension on R s/p TKA , otherwise all WNL ... ROM 10-70 in supine.        Communication   Communication: No difficulties  Cognition Arousal/Alertness: Awake/alert Behavior During Therapy: WFL for tasks assessed/performed Overall Cognitive Status: Within Functional Limits for tasks assessed                       General Comments      Exercises Total Joint Exercises Ankle Circles/Pumps: Supine;Right;AROM;10 reps Quad Sets: AROM;Supine;10 reps;Right Heel Slides: AAROM;Right;Supine;10 reps Straight Leg Raises: AAROM;Supine;10 reps (eduated on a lot of relaxation techniques and breathing. )      Assessment/Plan    PT Assessment Patient needs continued PT services  PT Diagnosis Difficulty walking   PT Problem List Decreased strength;Decreased range of motion;Decreased activity tolerance;Decreased balance;Decreased mobility;Decreased knowledge of use of DME  PT Treatment Interventions DME instruction;Gait training;Stair training;Functional mobility training;Therapeutic activities;Therapeutic exercise;Patient/family education   PT Goals (Current goals can be found in the Care Plan section) Acute Rehab PT Goals Patient Stated Goal: to get back on track again. I thin the back throw everything off PT Goal Formulation: With patient Time For Goal Achievement: 01/03/15 Potential to Achieve Goals: Good    Frequency Min 5X/week   Barriers to discharge        Co-evaluation               End of Session Equipment Utilized During Treatment: Gait belt Activity Tolerance: Patient tolerated treatment well Patient left: in chair;with call bell/phone within reach;with family/visitor present Nurse Communication: Mobility status    Functional Assessment Tool Used: clincial judgement  Functional Limitation: Mobility: Walking and moving around Mobility: Walking and Moving Around Current Status [phone]): At least 20 percent but less than 40 percent impaired, limited or restricted Mobility: Walking and Moving Around Goal Status [phone]): At least 1 percent but less than 20 percent impaired, limited or restricted    Time: 1112-1151 PT Time Calculation (min) (ACUTE ONLY): 39 min   Charges:   PT Evaluation $Initial PT Evaluation Tier I: 1 Procedure PT Treatments $Gait Training: 8-22  mins $Therapeutic Activity: 8-22 mins   PT G Codes:   PT G-Codes **NOT FOR INPATIENT CLASS** Functional Assessment Tool Used: clincial judgement  Functional Limitation: Mobility: Walking and moving around Mobility: Walking and Moving Around Current Status ([id number]): At least 20 percent but less than 40 percent impaired, limited or restricted Mobility: Walking and Moving Around Goal Status [phone]): At least 1 percent but less than 20 percent impaired, limited or restricted    Vernisha Bacote, Gatha Mayer 12/20/2014, 1:20 PM Clide Dales, PT Pager: [phone] 12/20/2014

## 2014-12-20 NOTE — Clinical Social Work Psychosocial (Signed)
Clinical Social Work Department BRIEF PSYCHOSOCIAL ASSESSMENT 12/20/2014  Patient:  Victoria Mosley, Victoria Mosley     Account Number:  [id number]     Admit date:  12/18/2014  Clinical Social Worker:  Dede Query, CLINICAL SOCIAL WORKER  Date/Time:  12/20/2014 02:40 PM  Referred by:  Physician  Date Referred:  12/19/2014 Referred for  SNF Placement   Other Referral:   Interview type:  Patient Other interview type:   and husband at bedside    PSYCHOSOCIAL DATA Living Status:  HUSBAND Admitted from facility:   Level of care:   Primary support name:  Victoria Mosley Primary support relationship to patient:  SPOUSE Degree of support available:   High/  husband is retired and available for support    [place]  Other Concerns:    [place] / PLAN CSW met with pt and her husband at bedside.  CSW introduced herself and explained the role of CSW.  CSW prompted pt and husband to discuss history and needs.  CSW provided information on SNF facilities that are available for pt's rehab.  CSW explored placement at discharge.  CSW provided active listening skills and positive communication regarding pt gaining strength back and returning home with husband   Assessment/plan status:  Psychosocial Support/Ongoing Assessment of Needs Other assessment/ plan:   Fax information to SNF facilities in carefinder   Information/referral to community resources:    PATIENT'S/FAMILY'S RESPONSE TO PLAN OF CARE: Pt discussed having her knee operated on recently and then having problems with her back and other knee while  at home causing her husband to call an ambulance to transport her to the hospital.  Pt frustrated that she is "behind" in rehabbing her knee since her back pain popped up while at home. Pt optimistic that she can progress in the future. Pt woks full time and her husband is retired but he could not help her up while at home so they feel she will need to rehab at a SNF at discharge.   Pt and  husband grateful for SNF information and would like either [facility] or [place] as first choices.  Pt's husband concerned about insurance but stated that pt's deductable will be met this year with her recent surgery so "it will all work out".     Dede Query, LCSW [place] Worker - Weekend Coverage cell #: [phone]

## 2014-12-21 DIAGNOSIS — G43001 Migraine without aura, not intractable, with status migrainosus: Secondary | ICD-10-CM

## 2014-12-21 MED ORDER — METHOCARBAMOL 500 MG PO TABS
500.0000 mg | ORAL_TABLET | Freq: Three times a day (TID) | ORAL | Status: DC | PRN
  Administered 2014-12-21: 500 mg via ORAL
  Filled 2014-12-21: qty 1

## 2014-12-21 MED ORDER — HYDROMORPHONE HCL 4 MG PO TABS: 4.0000 mg | ORAL_TABLET | ORAL | Status: DC | PRN

## 2014-12-21 MED ORDER — PREDNISONE 10 MG PO TABS
ORAL_TABLET | ORAL | Status: DC
Start: 2014-12-21 — End: 2015-02-14

## 2014-12-21 MED ORDER — METHOCARBAMOL 500 MG PO TABS: 500.0000 mg | ORAL_TABLET | Freq: Three times a day (TID) | ORAL | Status: DC | PRN

## 2014-12-21 MED ORDER — DIAZEPAM 5 MG PO TABS: 5.0000 mg | ORAL_TABLET | Freq: Four times a day (QID) | ORAL | Status: DC | PRN

## 2014-12-21 NOTE — Progress Notes (Signed)
CSW continuing to follow.  CSW followed up with pt regarding SNF bed offers. Pt accepts bed offer from [facility].  CSW spoke with MD who states pt is medically ready for discharge today.  CSW contacted [facility] and confirmed bed availability for today.  CSW to facilitate pt discharge needs this afternoon to [facility].  Alison Murray, MSW, LCSW Clinical Social Work Coverage for [employer], [place]

## 2014-12-21 NOTE — Discharge Summary (Addendum)
Physician Discharge Summary  Victoria Mosley ZWC:[id number] DOB: 09-Mar-1951 DOA: 12/18/2014  PCP: Laurey Morale, MD  Admit date: 12/18/2014 Discharge date: 12/21/2014  Time spent: 35 minutes  Recommendations for Outpatient Follow-up:  1. Follow up with neurosurgery as an outpatient   Discharge Diagnoses:  Principal Problem:   Intractable back pain Active Problems:   GERD   S/P total knee arthroplasty   Sciatica of left side   Migraines   DDD (degenerative disc disease), lumbar   Fever   Discharge Condition: STable  Diet recommendation: heart healthy  Filed Weights   12/20/14 1946  Weight: 88.451 kg (195 lb)    History of present illness:  64 y.o. female with a history of recent Right TKR on 12/08/2014 who presents to the ED with complaints of increased low back pain radiating into her left leg since 4 pm. She reports that she has chronic pain from lumbar DDD and that her pain was increased after having physical therapy during the day. She reports not having adequate pain control following her knee surgery with Vicodin Rx, and saw Dr. Maureen Ralphs in the office for follow-up on 12/17/2014 and was prescribed Hydromorphone Rx which improved her knee pain. She contacted the On-Call physician Dr. Lanice Schwab and was referred to the ED.    Hospital Course:  S/P total knee arthroplasty - Continue narcotics for pain reconsult physical therapy and recommended SNF.  Intractable back pain/Sciatica of left side: - started on IV narcotics and prednisone, once pain improved changed to oral narcotics. - Cont short course of steroids for 4 days. - Having regular BM.  Migraines - control.  Procedures:  None  Consultations:  ortho  Discharge Exam: Filed Vitals:   12/21/14 0500  BP: 135/65  Pulse: 73  Temp: 97.9 F (36.6 C)  Resp: 20    General: A&o x3 Cardiovascular: RRR Respiratory: good air movements CTA B/L  Discharge Instructions   Discharge  Instructions    Diet - low sodium heart healthy    Complete by:  As directed      Increase activity slowly    Complete by:  As directed           Current Discharge Medication List    START taking these medications   Details  methocarbamol (ROBAXIN) 500 MG tablet Take 1 tablet (500 mg total) by mouth every 8 (eight) hours as needed for muscle spasms. Qty: 30 tablet, Refills: 0      CONTINUE these medications which have CHANGED   Details  diazepam (VALIUM) 5 MG tablet Take 1 tablet (5 mg total) by mouth every 6 (six) hours as needed for muscle spasms. Qty: 90 tablet, Refills: 0    HYDROmorphone (DILAUDID) 4 MG tablet Take 1 tablet (4 mg total) by mouth every 4 (four) hours as needed for severe pain. Qty: 30 tablet, Refills: 0    predniSONE (DELTASONE) 10 MG tablet Takes 4 tablets for 1 days, then 3 tablets for 1 days, then 2 tablets for 1 days, then 1 tablets for 1 days and then stop. Qty: 10 tablet, Refills: 0      CONTINUE these medications which have NOT CHANGED   Details  diphenhydrAMINE (BENADRYL) 25 mg capsule Take 25 mg by mouth at bedtime.      estropipate (OGEN) 0.75 MG tablet Take 0.75 mg by mouth daily.    hydrochlorothiazide (HYDRODIURIL) 25 MG tablet Take 1 tablet (25 mg total) by mouth daily. Qty: 90 tablet, Refills: 3    loratadine (CLARITIN)  10 MG tablet Take 10 mg by mouth daily. For allergies    medroxyPROGESTERone (PROVERA) 2.5 MG tablet Take 2.5 mg by mouth daily.    !! omeprazole (PRILOSEC) 20 MG capsule TAKE ONE CAPSULE BY MOUTH ONCE DAILY Qty: 90 capsule, Refills: 0    !! omeprazole (PRILOSEC) 20 MG capsule Take 20 mg by mouth at bedtime.     polyethylene glycol (MIRALAX / GLYCOLAX) packet Take 17 g by mouth daily. States takes 1/3 packet at bedtime    potassium gluconate 595 MG TABS Take 1,190 mg by mouth daily.     !! propranolol (INDERAL) 40 MG tablet Take 40 mg by mouth at bedtime.    rivaroxaban (XARELTO) 10 MG TABS tablet Take 1 tablet  (10 mg total) by mouth daily with breakfast. Take Xarelto for two and a half more weeks, then discontinue Xarelto. Once the patient has completed the blood thinner regimen, then take a Baby 81 mg Aspirin daily for three more weeks. Qty: 19 tablet, Refills: 0    betamethasone valerate lotion (VALISONE) 0.1 % Apply topically 2 (two) times daily. Qty: 60 mL, Refills: 5    Butalbital-APAP-Caffeine 50-300-40 MG CAPS Take 1 capsule by mouth 2 (two) times daily. Qty: 60 capsule, Refills: 5    hyoscyamine (LEVSIN SL) 0.125 MG SL tablet Place 1 tab on the tongue to dissolve at the at the onset of cramping, spasms then every 4 hours as needed. Qty: 90 tablet, Refills: 3    !! propranolol (INDERAL) 40 MG tablet TAKE ONE TABLET BY MOUTH DAILY. Qty: 90 tablet, Refills: 3    pseudoephedrine (SUDAFED) 120 MG 12 hr tablet Take 120 mg by mouth 2 (two) times daily.    traMADol (ULTRAM) 50 MG tablet Take 1-2 tablets (50-100 mg total) by mouth every 6 (six) hours as needed (mild pain). Qty: 60 tablet, Refills: 1     !! - Potential duplicate medications found. Please discuss with provider.    STOP taking these medications     tiZANidine (ZANAFLEX) 4 MG tablet      HYDROcodone-acetaminophen (NORCO/VICODIN) 5-325 MG per tablet      nitrofurantoin, macrocrystal-monohydrate, (MACROBID) 100 MG capsule      pseudoephedrine-guaifenesin (MUCINEX D) 60-600 MG per tablet        Allergies  Allergen Reactions  . Pravastatin Sodium [Pravachol]     Joint pain(severe)  . Biaxin [Clarithromycin] Nausea And Vomiting  . Ciprofloxacin Nausea Only  . Clarithromycin Nausea Only  . Levaquin [Levofloxacin In D5w] Other (See Comments)    Hot, dizziness, diarrhea      The results of significant diagnostics from this hospitalization (including imaging, microbiology, ancillary and laboratory) are listed below for reference.    Significant Diagnostic Studies: Dg Chest 2 View  11/30/2014   CLINICAL DATA:  Cough.   EXAM: CHEST  2 VIEW  COMPARISON:  07/08/2012.  FINDINGS: Mediastinum and hilar structures are normal. Mild bibasilar subsegmental atelectasis. Mild right base infiltrate cannot be excluded. No pleural effusion or pneumothorax. Heart size normal. No acute bony abnormality.  IMPRESSION: Mild bibasilar subsegmental atelectasis. Mild right base pneumonia cannot be excluded   Electronically Signed   By: Marcello Moores  Register   On: 11/30/2014 09:21    Microbiology: Recent Results (from the past 240 hour(s))  Urine culture     Status: None   Collection Time: 12/19/14  5:02 AM  Result Value Ref Range Status   Specimen Description URINE, RANDOM  Final   Special Requests NONE  Final  [place] Count   Final    2,000 COLONIES/ML Performed at [facility]    Culture   Final    INSIGNIFICANT GROWTH Performed at [facility]    Report Status 12/20/2014 FINAL  Final     Labs: Basic Metabolic Panel:  Recent Labs Lab 12/19/14 0119 12/20/14 0448  NA 138 141  K 3.4* 4.3  CL 96 106  CO2  --  29  GLUCOSE 129* 123*  BUN 21 14  CREATININE 0.80 0.59  CALCIUM  --  8.5   Liver Function Tests: No results for input(s): AST, ALT, ALKPHOS, BILITOT, PROT, ALBUMIN in the last 168 hours. No results for input(s): LIPASE, AMYLASE in the last 168 hours. No results for input(s): AMMONIA in the last 168 hours. CBC:  Recent Labs Lab 12/19/14 0016 12/19/14 0119 12/20/14 0448  WBC 12.7*  --  8.8  NEUTROABS 8.8*  --   --   HGB 12.8 14.3 10.9*  HCT 39.2 42.0 34.5*  MCV 87.9  --  89.6  PLT 391  --  300   Cardiac Enzymes: No results for input(s): CKTOTAL, CKMB, CKMBINDEX, TROPONINI in the last 168 hours. BNP: BNP (last 3 results) No results for input(s): BNP in the last 8760 hours.  ProBNP (last 3 results) No results for input(s): PROBNP in the last 8760 hours.  CBG: No results for input(s): GLUCAP in the last 168 hours.     Signed:  Charlynne Cousins  [facility] 12/21/2014, 1:37 PM

## 2014-12-21 NOTE — Progress Notes (Signed)
Physical Therapy Treatment Patient Details Name: Victoria Mosley MRN: [id number] DOB: 1951/04/01 Today's Date: 12/21/2014    History of Present Illness Pt was doing well s/p RTKA about 1 week ago with HHPT however very suddenly felt radiating pain down L LE (from previous disc problems) and had great difficult and inabilit to get out of chair or ambulate.     PT Comments    Pt ambulated 100' with RW, performed R TKA exercises with assist. Continues to have R quadriceps weakness, requires assist for R knee extension. Pt notes less pain since having steroids today. REady to DC to SNF from PT standpoint. *  Follow Up Recommendations  SNF     Equipment Recommendations  None recommended by PT    Recommendations for Other Services OT consult     Precautions / Restrictions Precautions Precautions: Knee;Back Restrictions Weight Bearing Restrictions: Yes RLE Weight Bearing: Weight bearing as tolerated    Mobility  Bed Mobility                  Transfers Overall transfer level: Needs assistance Equipment used: Rolling walker (2 wheeled) Transfers: Sit to/from Stand Sit to Stand: +2 physical assistance;Mod assist Stand pivot transfers:  (to rise from elevated bed, legs are very weak )       General transfer comment: Difficult to get pt to rise off surface, heavy use of BUEs on armrests. Weak in BLEs.   Ambulation/Gait   Ambulation Distance (Feet): 100 Feet Assistive device: Rolling walker (2 wheeled) Gait Pattern/deviations: Step-to pattern   Gait velocity interpretation: Below normal speed for age/gender General Gait Details: cues to lift head, steady, no LOB   Stairs            Wheelchair Mobility    Modified Rankin (Stroke Patients Only)       Balance             Standing balance-Leahy Scale: Poor                      Cognition Arousal/Alertness: Awake/alert Behavior During Therapy: WFL for tasks assessed/performed Overall  Cognitive Status: Within Functional Limits for tasks assessed                      Exercises Total Joint Exercises Ankle Circles/Pumps: AROM;Both;15 reps;Supine Quad Sets: AROM;Both;10 reps;Supine Short Arc Quad: AAROM;Right;10 reps;Supine Heel Slides: AAROM;Right;10 reps;Supine Hip ABduction/ADduction: AAROM;Right;10 reps;Supine Straight Leg Raises: AAROM;Right;10 reps;Supine Long Arc Quad: AAROM;Right;10 reps;Seated Goniometric ROM: 5-80* R knee AAROM    General Comments        Pertinent Vitals/Pain Pain Score: 5  Pain Location: R knee with walking Pain Descriptors / Indicators: Sore Pain Intervention(s): Premedicated before session;Monitored during session;Limited activity within patient's tolerance;Ice applied    Home Living                      Prior Function            PT Goals (current goals can now be found in the care plan section) Acute Rehab PT Goals 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 PT Goal Formulation: With patient/family Time For Goal Achievement: 01/03/15 Potential to Achieve Goals: Good Progress towards PT goals: Progressing toward goals    Frequency  Min 5X/week    PT Plan Current plan remains appropriate    Co-evaluation             End of Session  Equipment Utilized During Treatment: Gait belt Activity Tolerance: Patient tolerated treatment well Patient left: with call bell/phone within reach;with family/visitor present;in chair (pt left on commode at pts request with husband monitoring)     Time: 0370-4888 PT Time Calculation (min) (ACUTE ONLY): 25 min  Charges:  $Gait Training: 8-22 mins $Therapeutic Exercise: 8-22 mins                    G Codes:  Functional Assessment Tool Used: clincial judgement  Mobility: Walking and Moving Around Discharge Status [phone]): At least 20 percent but less than 40 percent impaired, limited or restricted   Philomena Doheny 12/21/2014, 1:46  PM [phone]

## 2014-12-21 NOTE — Progress Notes (Addendum)
Pt for discharge to [facility].   CSW facilitated pt discharge needs including contacting facility and confirming that facility received insurance auth from [facility], faxing pt discharge information via [facility], discussing with pt and pt husband at bedside, providing RN phone number to call report, providing pt and pt husband discharge packet to provide to [place] upon pt arrival to facility, and pt husband plans to transport pt via private vehicle to [facility].   Pt and pt husband appreciative of CSW support and assistance. Pt eager to go to rehab at [place] in order to return home. 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   No further social work needs identified at this time.  CSW signing off.   Alison Murray, MSW, LCSW Clinical Social Work Coverage for [employer], [place]

## 2014-12-21 NOTE — Progress Notes (Deleted)
Pt for discharge to [facility].   CSW faxed pt updated clinical information to [facility] pt insurance authorization for SNF to be updated.  CSW received notification from [facility] admissions coordinator, Abigail Butts who stated that she had received updated insurance authorization from [facility] and pt can come to facility today.   CSW received phone call from pt friend, Thayer Headings stating that she plans to transport pt via private vehicle to [facility].   CSW spoke with pt at bedside to confirm plan for pt to be transported by pt friend, Thayer Headings. Pt relieved that she is able to transition to [facility] today.   CSW facilitated pt discharge needs including contacting facility, faxing pt discharge information via TLC, discussing with pt at bedside, providing RN phone number to call report, and providing discharge packet to RN for RN to provide to pt upon discharge in order for pt to provide discharge packet to [facility] upon arrival. Pt transporting via pt friend, Thayer Headings via private vehicle.   Alison Murray, MSW, [place] [employer] [phone]

## 2014-12-21 NOTE — Progress Notes (Signed)
UR completed 

## 2014-12-22 ENCOUNTER — Telehealth: Payer: Self-pay | Admitting: Family Medicine

## 2014-12-22 NOTE — Telephone Encounter (Signed)
Pt husband called and said his wife was taken to the hospital for back pain. They contacted Dr Ellene Route office and they can not see her until 01/28/15 and was told by  Dr Ellene Route office to contact her pcp and see if he will order a MRI for her low back pain.  Pt is now in [employer]

## 2014-12-22 NOTE — Telephone Encounter (Signed)
NO I cannot order such a scan for a problem that I have not evaluated. I don't understand why this was not done in the hospital since she was there for 3 days. If Dr. Ellene Route thinks she needs an MRI then HE can order it

## 2014-12-23 NOTE — Telephone Encounter (Signed)
I left a voice message with below information. 

## 2014-12-28 ENCOUNTER — Telehealth: Payer: Self-pay | Admitting: *Deleted

## 2014-12-28 DIAGNOSIS — D509 Iron deficiency anemia, unspecified: Secondary | ICD-10-CM

## 2014-12-28 NOTE — Addendum Note (Signed)
Addended by: Alysia Penna A on: 12/28/2014 05:42 PM   Modules accepted: Orders

## 2014-12-28 NOTE — Telephone Encounter (Signed)
Tell her to swing by this office after she gets out and we will draw a CBC here. It will be easier for me to treat it that way. I will put in a future order for this.

## 2014-12-28 NOTE — Telephone Encounter (Signed)
Pt's husband called stating pt is getting ready to be released from rehab on wednesday and her hemagolbin is low and they want it to be re checked on Wednesday when she leaves. Please advise

## 2014-12-29 NOTE — Telephone Encounter (Signed)
Pt scheduled for labs tomorrow.  

## 2014-12-30 ENCOUNTER — Other Ambulatory Visit (INDEPENDENT_AMBULATORY_CARE_PROVIDER_SITE_OTHER): Payer: 59

## 2014-12-30 DIAGNOSIS — D509 Iron deficiency anemia, unspecified: Secondary | ICD-10-CM

## 2014-12-30 LAB — CBC WITH DIFFERENTIAL/PLATELET
Basophils Absolute: 0 10*3/uL (ref 0.0–0.1)
Basophils Relative: 0.2 % (ref 0.0–3.0)
Eosinophils Absolute: 0.1 10*3/uL (ref 0.0–0.7)
Eosinophils Relative: 2.2 % (ref 0.0–5.0)
HCT: 39.2 % (ref 36.0–46.0)
Hemoglobin: 12.9 g/dL (ref 12.0–15.0)
Lymphocytes Relative: 24.4 % (ref 12.0–46.0)
Lymphs Abs: 1.6 10*3/uL (ref 0.7–4.0)
MCHC: 32.9 g/dL (ref 30.0–36.0)
MCV: 85.1 fl (ref 78.0–100.0)
Monocytes Absolute: 0.9 10*3/uL (ref 0.1–1.0)
Monocytes Relative: 14.5 % — ABNORMAL HIGH (ref 3.0–12.0)
Neutro Abs: 3.8 10*3/uL (ref 1.4–7.7)
Neutrophils Relative %: 58.7 % (ref 43.0–77.0)
Platelets: 405 10*3/uL — ABNORMAL HIGH (ref 150.0–400.0)
RBC: 4.6 Mil/uL (ref 3.87–5.11)
RDW: 16.4 % — ABNORMAL HIGH (ref 11.5–15.5)
WBC: 6.4 10*3/uL (ref 4.0–10.5)

## 2014-12-30 LAB — FERRITIN: Ferritin: 63.5 ng/mL (ref 10.0–291.0)

## 2014-12-31 ENCOUNTER — Ambulatory Visit: Payer: 59 | Admitting: Family Medicine

## 2015-01-28 DIAGNOSIS — M4716 Other spondylosis with myelopathy, lumbar region: Secondary | ICD-10-CM | POA: Insufficient documentation

## 2015-02-01 ENCOUNTER — Other Ambulatory Visit: Payer: Self-pay | Admitting: Family Medicine

## 2015-02-08 ENCOUNTER — Encounter (HOSPITAL_COMMUNITY): Payer: Self-pay | Admitting: *Deleted

## 2015-02-08 ENCOUNTER — Emergency Department (HOSPITAL_COMMUNITY)
Admission: EM | Admit: 2015-02-08 | Discharge: 2015-02-08 | Disposition: A | Discharge status: Home / Self Care | Payer: 59 | Attending: Emergency Medicine | Admitting: Emergency Medicine

## 2015-02-08 DIAGNOSIS — Z8679 Personal history of other diseases of the circulatory system: Secondary | ICD-10-CM | POA: Diagnosis not present

## 2015-02-08 DIAGNOSIS — K219 Gastro-esophageal reflux disease without esophagitis: Secondary | ICD-10-CM | POA: Diagnosis not present

## 2015-02-08 DIAGNOSIS — Z87442 Personal history of urinary calculi: Secondary | ICD-10-CM | POA: Insufficient documentation

## 2015-02-08 DIAGNOSIS — Z8739 Personal history of other diseases of the musculoskeletal system and connective tissue: Secondary | ICD-10-CM | POA: Diagnosis not present

## 2015-02-08 DIAGNOSIS — Z87448 Personal history of other diseases of urinary system: Secondary | ICD-10-CM | POA: Diagnosis not present

## 2015-02-08 DIAGNOSIS — Z87891 Personal history of nicotine dependence: Secondary | ICD-10-CM | POA: Insufficient documentation

## 2015-02-08 DIAGNOSIS — Z8742 Personal history of other diseases of the female genital tract: Secondary | ICD-10-CM | POA: Insufficient documentation

## 2015-02-08 DIAGNOSIS — Z87440 Personal history of urinary (tract) infections: Secondary | ICD-10-CM | POA: Insufficient documentation

## 2015-02-08 DIAGNOSIS — R040 Epistaxis: Secondary | ICD-10-CM | POA: Insufficient documentation

## 2015-02-08 DIAGNOSIS — Z872 Personal history of diseases of the skin and subcutaneous tissue: Secondary | ICD-10-CM | POA: Insufficient documentation

## 2015-02-08 LAB — CBC WITH DIFFERENTIAL/PLATELET
Basophils Absolute: 0.1 10*3/uL (ref 0.0–0.1)
Basophils Relative: 2 % — ABNORMAL HIGH (ref 0–1)
Eosinophils Absolute: 0.2 10*3/uL (ref 0.0–0.7)
Eosinophils Relative: 3 % (ref 0–5)
HCT: 39.5 % (ref 36.0–46.0)
Hemoglobin: 12.6 g/dL (ref 12.0–15.0)
Lymphocytes Relative: 35 % (ref 12–46)
Lymphs Abs: 2.3 10*3/uL (ref 0.7–4.0)
MCH: 26.5 pg (ref 26.0–34.0)
MCHC: 31.9 g/dL (ref 30.0–36.0)
MCV: 83.2 fL (ref 78.0–100.0)
Monocytes Absolute: 1 10*3/uL (ref 0.1–1.0)
Monocytes Relative: 15 % — ABNORMAL HIGH (ref 3–12)
Neutro Abs: 3 10*3/uL (ref 1.7–7.7)
Neutrophils Relative %: 45 % (ref 43–77)
Platelets: 265 10*3/uL (ref 150–400)
RBC: 4.75 MIL/uL (ref 3.87–5.11)
RDW: 14.8 % (ref 11.5–15.5)
WBC: 6.6 10*3/uL (ref 4.0–10.5)

## 2015-02-08 MED ORDER — SULFAMETHOXAZOLE-TRIMETHOPRIM 800-160 MG PO TABS: 1.0000 | ORAL_TABLET | Freq: Two times a day (BID) | ORAL | Status: DC

## 2015-02-08 MED ORDER — ONDANSETRON 4 MG PO TBDP
4.0000 mg | ORAL_TABLET | Freq: Once | ORAL | Status: AC
  Administered 2015-02-08: 4 mg via ORAL
  Filled 2015-02-08: qty 1

## 2015-02-08 NOTE — ED Notes (Signed)
The pt is c/o a nose bleed since 2100  And her nose bled again at 2300 no bleeding now

## 2015-02-08 NOTE — ED Notes (Signed)
MD at bedside. 

## 2015-02-08 NOTE — ED Provider Notes (Signed)
CSN: [id number]     Arrival date & time 02/08/15  0004 History  This chart was scribed for Delora Fuel, MD by Hilda Lias, ED Scribe. This patient was seen in room B18C/B18C and the patient's care was started at 2:31 AM.    Chief Complaint  Patient presents with  . Epistaxis     The history is provided by the patient. No language interpreter was used.     HPI Comments: Victoria Mosley is a 64 y.o. female who presents to the Emergency Department complaining of intermittent nose bleeds that have been present for 5.5 hours. Pt states that she normally gets nose bleeds occasionally, but tonight is the worst it has ever been. Pt states that 4-5 hours ago her bleeding was nonstop. Pt notes she was able to stop the bleeding for about an hour, and then when she laid down to sleep she began to bleed uncontrollably for about another hour. Pt states that she got to ED at [place] and it has not bled since then.     Past Medical History  Diagnosis Date  . Menopause     sees Dr. Phineas Real  . 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 Migraines   . GERD (gastroesophageal reflux disease)   . IBS (irritable bowel syndrome)   . Arthritis   . Hyperlipidemia   . Diverticulitis   . UTI (urinary tract infection)     started atb on 03-24-12  . Bilateral knee pain     sees Dr. Wynelle Link   . Kidney stones     bilateral  . Nephrolithiasis     left  . Normal cardiac stress test 2012  . History of renal calculi   . Bilateral renal cysts   . Neurogenic bladder   . History of melanoma excision   . Urinary urgency   . 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 Cough 11/30/14     cough since 11/20/14- now productine- green with green mucus out of nose- no fever  . Chronic UTI (urinary tract infection)    Past Surgical History  Procedure Laterality Date  . Melanoma excision  2007    left upper arm per dr. Teressa Senter  . Foot surgery Bilateral 1987    per Dr. Alphonzo Cruise  .  Nasal septum surgery  1992    per Dr. Cynda Familia  . Lithotripsy  2007    x 3  . Lumbar laminectomy  1993    1993, at L5 per Dr. Quentin Cornwall  . Colonoscopy  03-28-12    per Dr. Henrene Pastor, polyp,  repeat in 5 yrs  . Cesarean section    . Tubal ligation    . Kidney stone surgery  2005    per Dr. Reece Agar  . Total hip arthroplasty Right 2010    [place]  . Posterior cervical laminectomy  07/08/2012    Procedure: POSTERIOR CERVICAL LAMINECTOMY;  Surgeon: Kristeen Miss, MD;  Location: [place] [facility];  Service: Neurosurgery;  Laterality: N/A;  Cervical Seven- thoracic One Laminectomy and Decompression of Epidural Hematoma  . Hematoma evacuation  07/08/2012    Procedure: EVACUATION HEMATOMA;  Surgeon: Kristeen Miss, MD;  Location: [place] [facility];  Service: Neurosurgery;  Laterality: N/A;  Cervical Seven-thoracic One Laminectomy and Decompression of Epidural Hematoma  . Cystoscopy with retrograde pyelogram, ureteroscopy and stent placement Left 12/25/2012    Procedure: CYSTOSCOPY WITH RETROGRADE PYELOGRAM, URETEROSCOPY, EXTRACTION OF  LEFT STONE WITH BASKETAND STENT PLACEMENT;  Surgeon: Molli Hazard, MD;  Location: [facility];  Service: Urology;  Laterality: Left;  2.5 HRS   . Total knee arthroplasty Right 12/07/2014    Procedure: RIGHT TOTAL KNEE ARTHROPLASTY;  Surgeon: [name], MD;  Location: [facility];  Service: Orthopedics;  Laterality: Right;   Family History  Problem Relation Age of Onset  . Heart disease Mother   . Hypertension Mother   . Diabetes Mother   . Hypertension Father   . Leukemia Father   . Hypertension Sister   . Heart disease Sister   . Bladder Cancer Sister   . Stomach cancer Paternal Grandmother   . Colon cancer Neg Hx   . Esophageal cancer Neg Hx   . Rectal cancer Neg Hx    History  Substance Use Topics  . Smoking status: Former Smoker -- 0.25 packs/day    Quit date: 12/20/1998  . Smokeless tobacco: Never Used  . Alcohol Use: No   OB History     Gravida Para Term Preterm AB TAB SAB Ectopic Multiple Living   1 1 1       1      Review of Systems  HENT: Positive for nosebleeds.   All other systems reviewed and are negative.     Allergies  Pravastatin sodium; Biaxin; Ciprofloxacin; Clarithromycin; and Levaquin  Home Medications   Prior to Admission medications   Medication Sig Start Date End Date Taking? Authorizing Provider  betamethasone valerate lotion (VALISONE) 0.1 % Apply topically 2 (two) times daily. 07/28/14   Laurey Morale, MD  Butalbital-APAP-Caffeine 50-300-40 MG CAPS Take 1 capsule by mouth 2 (two) times daily. 09/04/14   Laurey Morale, MD  diazepam (VALIUM) 5 MG tablet Take 1 tablet (5 mg total) by mouth every 6 (six) hours as needed for muscle spasms. 12/21/14   Charlynne Cousins, MD  diphenhydrAMINE (BENADRYL) 25 mg capsule Take 25 mg by mouth at bedtime.      Historical Provider, MD  estropipate (OGEN) 0.75 MG tablet Take 0.75 mg by mouth daily.    Historical Provider, MD  hydrochlorothiazide (HYDRODIURIL) 25 MG tablet Take 1 tablet (25 mg total) by mouth daily. 07/28/14   Laurey Morale, MD  HYDROmorphone (DILAUDID) 4 MG tablet Take 1 tablet (4 mg total) by mouth every 4 (four) hours as needed for severe pain. 12/21/14   Charlynne Cousins, MD  hyoscyamine (LEVSIN SL) 0.125 MG SL tablet Place 1 tab on the tongue to dissolve at the at the onset of cramping, spasms then every 4 hours as needed. 07/28/14   Laurey Morale, MD  loratadine (CLARITIN) 10 MG tablet Take 10 mg by mouth daily. For allergies    Historical Provider, MD  medroxyPROGESTERone (PROVERA) 2.5 MG tablet Take 2.5 mg by mouth daily.    Historical Provider, MD  methocarbamol (ROBAXIN) 500 MG tablet Take 1 tablet (500 mg total) by mouth every 8 (eight) hours as needed for muscle spasms. 12/21/14   Charlynne Cousins, MD  omeprazole (PRILOSEC) 20 MG capsule TAKE ONE CAPSULE BY MOUTH ONCE DAILY 02/02/15   Laurey Morale, MD  polyethylene glycol [facility] /  GLYCOLAX) packet Take 17 g by mouth daily. States takes 1/3 packet at bedtime    Historical Provider, MD  potassium gluconate 595 MG TABS Take 1,190 mg by mouth daily.     Historical Provider, MD  predniSONE (DELTASONE) 10 MG tablet Takes 4 tablets for 1  days, then 3 tablets for 1 days, then 2 tablets for 1 days, then 1 tablets for 1 days and then stop. 12/21/14   Charlynne Cousins, MD  propranolol (INDERAL) 40 MG tablet TAKE ONE TABLET BY MOUTH DAILY. 07/28/14   Laurey Morale, MD  propranolol (INDERAL) 40 MG tablet Take 40 mg by mouth at bedtime.    Historical Provider, MD  pseudoephedrine (SUDAFED) 120 MG 12 hr tablet Take 120 mg by mouth 2 (two) times daily.    Historical Provider, MD  rivaroxaban (XARELTO) 10 MG TABS tablet Take 1 tablet (10 mg total) by mouth daily with breakfast. Take Xarelto for two and a half more weeks, then discontinue Xarelto. Once the patient has completed the blood thinner regimen, then take a Baby 81 mg Aspirin daily for three more weeks. 12/08/14   Arlee Muslim, PA-C  traMADol (ULTRAM) 50 MG tablet Take 1-2 tablets (50-100 mg total) by mouth every 6 (six) hours as needed (mild pain). 12/08/14   Arlee Muslim, PA-C   BP 129/73 mmHg  Pulse 66  Temp(Src) 98.1 F (36.7 C) (Oral)  Resp 18  Wt 191 lb (86.637 kg)  SpO2 100% Physical Exam  Constitutional: She is oriented to person, place, and time. She appears well-developed and well-nourished.  HENT:  Head: Normocephalic and atraumatic.  Bleeding site identified on left side of nasal septum  No active bleeding  Cardiovascular: Normal rate.   Pulmonary/Chest: Effort normal.  Abdominal: She exhibits no distension.  Neurological: She is alert and oriented to person, place, and time.  Skin: Skin is warm and dry.  Psychiatric: She has a normal mood and affect.  Nursing note and vitals reviewed.   ED Course  EPISTAXIS MANAGEMENT Date/Time: 02/08/2015 3:26 AM Performed by: Delora Fuel Authorized by: Roxanne Mins,  Beverly Ferner Consent: Verbal consent obtained. Written consent not obtained. Risks and benefits: risks, benefits and alternatives were discussed Consent given by: patient Patient understanding: patient states understanding of the procedure being performed Patient consent: the patient's understanding of the procedure matches consent given Procedure consent: procedure consent matches procedure scheduled Relevant documents: relevant documents present and verified Site marked: the operative site was marked Required items: required blood products, implants, devices, and special equipment available Patient identity confirmed: verbally with patient and arm band Time out: Immediately prior to procedure a "time out" was called to verify the correct patient, procedure, equipment, support staff and site/side marked as required. Patient sedated: no Treatment site: left anterior Repair method: anterior pack (Rapid Rhino 5.5cm) Post-procedure assessment: bleeding stopped Treatment complexity: simple Patient tolerance: Patient tolerated the procedure well with no immediate complications   (including critical care time)  DIAGNOSTIC STUDIES: Oxygen Saturation is 100% on room air, normal by my interpretation.    COORDINATION OF CARE: 2:36 AM Discussed treatment plan with pt at bedside and pt agreed to plan.  Labs Review Results for orders placed or performed during the hospital encounter of 02/08/15  CBC with Differential  Result Value Ref Range   WBC 6.6 4.0 - 10.5 K/uL   RBC 4.75 3.87 - 5.11 MIL/uL   Hemoglobin 12.6 12.0 - 15.0 g/dL   HCT 39.5 36.0 - 46.0 %   MCV 83.2 78.0 - 100.0 fL   MCH 26.5 26.0 - 34.0 pg   MCHC 31.9 30.0 - 36.0 g/dL   RDW 14.8 11.5 - 15.5 %   Platelets 265 150 - 400 K/uL   Neutrophils Relative % 45 43 - 77 %   Neutro Abs 3.0  1.7 - 7.7 K/uL   Lymphocytes Relative 35 12 - 46 %   Lymphs Abs 2.3 0.7 - 4.0 K/uL   Monocytes Relative 15 (H) 3 - 12 %   Monocytes Absolute 1.0 0.1 -  1.0 K/uL   Eosinophils Relative 3 0 - 5 %   Eosinophils Absolute 0.2 0.0 - 0.7 K/uL   Basophils Relative 2 (H) 0 - 1 %   Basophils Absolute 0.1 0.0 - 0.1 K/uL   MDM   Final diagnoses:  Anterior epistaxis    Left-sided anterior epistaxis. After bleeding site was identified, patient asked she had recurrence of her bleeding. She was treated with nasal pack with Rapid Rhino with good control of bleeding. She was observed in the ED with no recurrence of bleeding. She is discharged to with instructions to follow-up with ENT in 3 days. She is given prescription for trimethoprim-sulfamethoxazole. She has declined any pain medication stating she will take acetaminophen or Fioricet.  I personally performed the services described in this documentation, which was scribed in my presence. The recorded information has been reviewed and is accurate.     Delora Fuel, MD [id number]

## 2015-02-08 NOTE — Discharge Instructions (Signed)
Nosebleed Nosebleeds can be caused by many conditions, including trauma, infections, polyps, foreign bodies, dry mucous membranes or climate, medicines, and air conditioning. Most nosebleeds occur in the front of the nose. Because of this location, most nosebleeds can be controlled by pinching the nostrils gently and continuously for at least 10 to 20 minutes. The long, continuous pressure allows enough time for the blood to clot. If pressure is released during that 10 to 20 minute time period, the process may have to be started again. The nosebleed may stop by itself or quit with pressure, or it may need concentrated heating (cautery) or pressure from packing. HOME CARE INSTRUCTIONS   If your nose was packed, try to maintain the pack inside until your health care provider removes it. If a gauze pack was used and it starts to fall out, gently replace it or cut the end off. Do not cut if a balloon catheter was used to pack the nose. Otherwise, do not remove unless instructed.  Avoid blowing your nose for 12 hours after treatment. This could dislodge the pack or clot and start the bleeding again.  If the bleeding starts again, sit up and bend forward, gently pinching the front half of your nose continuously for 20 minutes.  If bleeding was caused by dry mucous membranes, use over-the-counter saline nasal spray or gel. This will keep the mucous membranes moist and allow them to heal. If you must use a lubricant, choose the water-soluble variety. Use it only sparingly and not within several hours of lying down.  Do not use petroleum jelly or mineral oil, as these may drip into the lungs and cause serious problems.  Maintain humidity in your home by using less air conditioning or by using a humidifier.  Do not use aspirin or medicines which make bleeding more likely. Your health care provider can give you recommendations on this.  Resume normal activities as you are able, but try to avoid straining,  lifting, or bending at the waist for several days.  If the nosebleeds become recurrent and the cause is unknown, your health care provider may suggest laboratory tests. SEEK MEDICAL CARE IF: You have a fever. SEEK IMMEDIATE MEDICAL CARE IF:   Bleeding recurs and cannot be controlled.  There is unusual bleeding from or bruising on other parts of the body.  Nosebleeds continue.  There is any worsening of the condition which originally brought you in.  You become light-headed, feel faint, become sweaty, or vomit blood. MAKE SURE YOU:   Understand these instructions.  Will watch your condition.  Will get help right away if you are not doing well or get worse. Document Released: 07/26/2005 Document Revised: 03/02/2014 Document Reviewed: 09/16/2009 [facility] Patient Information 2015 [place], [place]. This information is not intended to replace advice given to you by your health care provider. Make sure you discuss any questions you have with your health care provider.  Sulfamethoxazole; Trimethoprim, SMX-TMP tablets What is this medicine? SULFAMETHOXAZOLE; TRIMETHOPRIM or SMX-TMP (suhl fuh meth OK suh zohl; trye METH oh prim) is a combination of a sulfonamide antibiotic and a second antibiotic, trimethoprim. It is used to treat or prevent certain kinds of bacterial infections. It will not work for colds, flu, or other viral infections. This medicine may be used for other purposes; ask your health care provider or pharmacist if you have questions. COMMON BRAND NAME(S): Bacter-Aid DS, Bactrim, Bactrim DS, Septra, Septra DS What should I tell my health care provider before I take this medicine?  They need to know if you have any of these conditions: -anemia -asthma -being treated with anticonvulsants -if you frequently drink alcohol containing drinks -kidney disease -liver disease -low level of folic acid or TDVVOHY-0-VPXTGGYIR dehydrogenase -poor nutrition or  malabsorption -porphyria -severe allergies -thyroid disorder -an unusual or allergic reaction to sulfamethoxazole, trimethoprim, sulfa drugs, other medicines, foods, dyes, or preservatives -pregnant or trying to get pregnant -breast-feeding How should I use this medicine? Take this medicine by mouth with a full glass of water. Follow the directions on the prescription label. Take your medicine at regular intervals. Do not take it more often than directed. Do not skip doses or stop your medicine early. Talk to your pediatrician regarding the use of this medicine in children. Special care may be needed. This medicine has been used in children as young as 17 months of age. Overdosage: If you think you have taken too much of this medicine contact a poison control center or emergency room at once. NOTE: This medicine is only for you. Do not share this medicine with others. What if I miss a dose? If you miss a dose, take it as soon as you can. If it is almost time for your next dose, take only that dose. Do not take double or extra doses. What may interact with this medicine? Do not take this medicine with any of the following medications: -aminobenzoate potassium -dofetilide -metronidazole This medicine may also interact with the following medications: -ACE inhibitors like benazepril, enalapril, lisinopril, and ramipril -birth control pills -cyclosporine -digoxin -diuretics -indomethacin -medicines for diabetes -methenamine -methotrexate -phenytoin -potassium supplements -pyrimethamine -sulfinpyrazone -tricyclic antidepressants -warfarin This list may not describe all possible interactions. Give your health care provider a list of all the medicines, herbs, non-prescription drugs, or dietary supplements you use. Also tell them if you smoke, drink alcohol, or use illegal drugs. Some items may interact with your medicine. What should I watch for while using this medicine? Tell your doctor  or health care professional if your symptoms do not improve. Drink several glasses of water a day to reduce the risk of kidney problems. Do not treat diarrhea with over the counter products. Contact your doctor if you have diarrhea that lasts more than 2 days or if it is severe and watery. This medicine can make you more sensitive to the sun. Keep out of the sun. If you cannot avoid being in the sun, wear protective clothing and use a sunscreen. Do not use sun lamps or tanning beds/booths. What side effects may I notice from receiving this medicine? Side effects that you should report to your doctor or health care professional as soon as possible: -allergic reactions like skin rash or hives, swelling of the face, lips, or tongue -breathing problems -fever or chills, sore throat -irregular heartbeat, chest pain -joint or muscle pain -pain or difficulty passing urine -red pinpoint spots on skin -redness, blistering, peeling or loosening of the skin, including inside the mouth -unusual bleeding or bruising -unusually weak or tired -yellowing of the eyes or skin Side effects that usually do not require medical attention (report to your doctor or health care professional if they continue or are bothersome): -diarrhea -dizziness -headache -loss of appetite -nausea, vomiting -nervousness This list may not describe all possible side effects. Call your doctor for medical advice about side effects. You may report side effects to FDA at 1-800-FDA-1088. Where should I keep my medicine? Keep out of the reach of children. Store at room temperature between 20 to  25 degrees C (68 to 77 degrees F). Protect from light. Throw away any unused medicine after the expiration date. NOTE: This sheet is a summary. It may not cover all possible information. If you have questions about this medicine, talk to your doctor, pharmacist, or health care provider.  2015, [employer]. (2013-05-23 14:38:26)

## 2015-02-14 ENCOUNTER — Other Ambulatory Visit: Payer: Self-pay | Admitting: Family Medicine

## 2015-02-22 ENCOUNTER — Telehealth: Payer: Self-pay | Admitting: *Deleted

## 2015-02-22 ENCOUNTER — Telehealth: Payer: Self-pay | Admitting: Family Medicine

## 2015-02-22 ENCOUNTER — Other Ambulatory Visit: Payer: Self-pay | Admitting: Family Medicine

## 2015-02-22 MED ORDER — PROPRANOLOL HCL 40 MG PO TABS: ORAL_TABLET | ORAL | Status: DC

## 2015-02-22 MED ORDER — EZETIMIBE 10 MG PO TABS: 10.0000 mg | ORAL_TABLET | Freq: Every day | ORAL | Status: DC

## 2015-02-22 NOTE — Telephone Encounter (Signed)
Ok to start back on Zetia?

## 2015-02-22 NOTE — Telephone Encounter (Signed)
Error

## 2015-02-22 NOTE — Telephone Encounter (Signed)
Pt is requesting a rx for the following med ZETIA AND propranolol (INDERAL) 40 MG tablet  Pt said she was taking Zetia but they stop taking because it was two expensive     Need a new rx for 90 day supply and a 1 year rx    Pharmacy [facility]

## 2015-02-22 NOTE — Telephone Encounter (Deleted)
Rx for Zetia sent in via [facility].

## 2015-02-22 NOTE — Telephone Encounter (Signed)
done

## 2015-02-22 NOTE — Telephone Encounter (Signed)
Rx request for Propranolol via [facility] was sent

## 2015-02-24 ENCOUNTER — Telehealth: Payer: Self-pay | Admitting: Family Medicine

## 2015-02-24 NOTE — Telephone Encounter (Signed)
Script for Zetia must be called in to pharmacy.

## 2015-02-25 MED ORDER — EZETIMIBE 10 MG PO TABS: 10.0000 mg | ORAL_TABLET | Freq: Every day | ORAL | Status: DC

## 2015-02-25 NOTE — Telephone Encounter (Signed)
I resent script e-scribe to mail order.

## 2015-03-01 ENCOUNTER — Other Ambulatory Visit: Payer: Self-pay | Admitting: Gynecology

## 2015-03-01 ENCOUNTER — Telehealth: Payer: Self-pay | Admitting: Family Medicine

## 2015-03-01 MED ORDER — PROPRANOLOL HCL 40 MG PO TABS: ORAL_TABLET | ORAL | Status: DC

## 2015-03-01 NOTE — Telephone Encounter (Signed)
I had to [facility] which [facility] did.

## 2015-03-18 ENCOUNTER — Other Ambulatory Visit (HOSPITAL_COMMUNITY)
Admission: RE | Admit: 2015-03-18 | Discharge: 2015-03-18 | Disposition: A | Discharge status: Home / Self Care | Payer: 59 | Source: Ambulatory Visit | Attending: Gynecology | Admitting: Gynecology

## 2015-03-18 ENCOUNTER — Ambulatory Visit (INDEPENDENT_AMBULATORY_CARE_PROVIDER_SITE_OTHER): Payer: 59 | Admitting: Gynecology

## 2015-03-18 ENCOUNTER — Encounter: Payer: Self-pay | Admitting: Gynecology

## 2015-03-18 VITALS — BP 120/70 | Ht 67.0 in | Wt 192.0 lb

## 2015-03-18 DIAGNOSIS — R87810 Cervical high risk human papillomavirus (HPV) DNA test positive: Secondary | ICD-10-CM | POA: Insufficient documentation

## 2015-03-18 DIAGNOSIS — Z01419 Encounter for gynecological examination (general) (routine) without abnormal findings: Secondary | ICD-10-CM | POA: Diagnosis present

## 2015-03-18 DIAGNOSIS — Z1151 Encounter for screening for human papillomavirus (HPV): Secondary | ICD-10-CM | POA: Diagnosis present

## 2015-03-18 DIAGNOSIS — Z79890 Hormone replacement therapy (postmenopausal): Secondary | ICD-10-CM

## 2015-03-18 DIAGNOSIS — K649 Unspecified hemorrhoids: Secondary | ICD-10-CM | POA: Diagnosis not present

## 2015-03-18 MED ORDER — ESTROPIPATE 0.75 MG PO TABS: 0.7500 mg | ORAL_TABLET | Freq: Every day | ORAL | Status: DC

## 2015-03-18 MED ORDER — MEDROXYPROGESTERONE ACETATE 2.5 MG PO TABS: 2.5000 mg | ORAL_TABLET | Freq: Every day | ORAL | Status: DC

## 2015-03-18 NOTE — Addendum Note (Signed)
Addended by: Nelva Nay on: 03/18/2015 04:34 PM   Modules accepted: Orders

## 2015-03-18 NOTE — Progress Notes (Signed)
Bijal Siglin Dangerfield 02-24-1951 [id number]        64 y.o.  G1P1001 for annual exam.  Several issues noted below.  Past medical history,surgical history, problem list, medications, allergies, family history and social history were all reviewed and documented as reviewed in the EPIC chart.  ROS:  Performed with pertinent positives and negatives included in the history, assessment and plan.   Additional significant findings :  none   Exam: Kim Counsellor Vitals:   03/18/15 1559  BP: 120/70  Height: 5\' 7"  (1.702 m)  Weight: 192 lb (87.091 kg)   General appearance:  Normal affect, orientation and appearance. Skin: Grossly normal HEENT: Without gross lesions.  No cervical or supraclavicular adenopathy. Thyroid normal.  Lungs:  Clear without wheezing, rales or rhonchi Cardiac: RR, without RMG Abdominal:  Soft, nontender, without masses, guarding, rebound, organomegaly or hernia Breasts:  Examined lying and sitting without masses, retractions, discharge or axillary adenopathy. Pelvic:  Ext/BUS/vagina with atrophic changes  Cervix with atrophic changes  Uterus anteverted, normal size, shape and contour, midline and mobile nontender   Adnexa  Without masses or tenderness    Anus and perineum  Normal excepting significant altered hemorrhoids  Rectovaginal  Normal sphincter tone without palpated masses or tenderness.    Assessment/Plan:  64 y.o. G46P1001 female for annual exam.   1. Postmenopausal/HRT. Patient continues on Ogen 0.75 and Provera 2.5 daily. Had tried to wean but had unacceptable hot flashes and didn't feel well. I again reviewed the whole issue of HRT to include increased risk of stroke heart attack DVT and breast cancer. Recommendations for lowest dose for shortest period of time reviewed. Patient understands the issues and desires to continue and I refilled those 1 year. 2. 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 Pap smear/HPV today. If  positive then plan colposcopy. If negative then plan repeat Pap smear next year. No history of significant abnormal Pap smears previously. 3. Hemorrhoids. Patient notes intermittent bleeding with her hemorrhoids. She does have large hemorrhoids. Asked about whether she should have something done. Recommend she follow up with general surgeon to discuss treatment options. Patient agrees to do so. 4. History of multiple small myomas. Uterus grossly normal in size on exam. Continue with annual exam follow up. 5. DEXA normal 2015. Recommend repeat at 5 year interval. Increased calcium vitamin D reviewed. 6. Colonoscopy 2013. Repeat at their recommended interval. 7. Health maintenance. No routine lab work done as this is done at Dr. Barbie Banner office. Follow up in one year, sooner as needed.     [name] MD, 4:25 PM 03/18/2015

## 2015-03-18 NOTE — Patient Instructions (Signed)
Office will call you to arrange the appointment with the general surgeons in reference to your hemorrhoids.  You may obtain a copy of any labs that were done today by logging onto MyChart as outlined in the instructions provided with your AVS (after visit summary). The office will not call with normal lab results but certainly if there are any significant abnormalities then we will contact you.   Health Maintenance, Female A healthy lifestyle and preventative care can promote health and wellness.  Maintain regular health, dental, and eye exams.  Eat a healthy diet. Foods like vegetables, fruits, whole grains, low-fat dairy products, and lean protein foods contain the nutrients you need without too many calories. Decrease your intake of foods high in solid fats, added sugars, and salt. Get information about a proper diet from your caregiver, if necessary.  Regular physical exercise is one of the most important things you can do for your health. Most adults should get at least 150 minutes of moderate-intensity exercise (any activity that increases your heart rate and causes you to sweat) each week. In addition, most adults need muscle-strengthening exercises on 2 or more days a week.   Maintain a healthy weight. The body mass index (BMI) is a screening tool to identify possible weight problems. It provides an estimate of body fat based on height and weight. Your caregiver can help determine your BMI, and can help you achieve or maintain a healthy weight. For adults 20 years and older:  A BMI below 18.5 is considered underweight.  A BMI of 18.5 to 24.9 is normal.  A BMI of 25 to 29.9 is considered overweight.  A BMI of 30 and above is considered obese.  Maintain normal blood lipids and cholesterol by exercising and minimizing your intake of saturated fat. Eat a balanced diet with plenty of fruits and vegetables. Blood tests for lipids and cholesterol should begin at age 42 and be repeated every  5 years. If your lipid or cholesterol levels are high, you are over 50, or you are a high risk for heart disease, you may need your cholesterol levels checked more frequently.Ongoing high lipid and cholesterol levels should be treated with medicines if diet and exercise are not effective.  If you smoke, find out from your caregiver how to quit. If you do not use tobacco, do not start.  Lung cancer screening is recommended for adults aged 60 80 years who are at high risk for developing lung cancer because of a history of smoking. Yearly low-dose computed tomography (CT) is recommended for people who have at least a 30-pack-year history of smoking and are a current smoker or have quit within the past 15 years. A pack year of smoking is smoking an average of 1 pack of cigarettes a day for 1 year (for example: 1 pack a day for 30 years or 2 packs a day for 15 years). Yearly screening should continue until the smoker has stopped smoking for at least 15 years. Yearly screening should also be stopped for people who develop a health problem that would prevent them from having lung cancer treatment.  If you are pregnant, do not drink alcohol. If you are breastfeeding, be very cautious about drinking alcohol. If you are not pregnant and choose to drink alcohol, do not exceed 1 drink per day. One drink is considered to be 12 ounces (355 mL) of beer, 5 ounces (148 mL) of wine, or 1.5 ounces (44 mL) of liquor.  Avoid use of street  drugs. Do not share needles with anyone. Ask for help if you need support or instructions about stopping the use of drugs.  High blood pressure causes heart disease and increases the risk of stroke. Blood pressure should be checked at least every 1 to 2 years. Ongoing high blood pressure should be treated with medicines, if weight loss and exercise are not effective.  If you are 59 to 63 years old, ask your caregiver if you should take aspirin to prevent strokes.  Diabetes screening  involves taking a blood sample to check your fasting blood sugar level. This should be done once every 3 years, after age 75, if you are within normal weight and without risk factors for diabetes. Testing should be considered at a younger age or be carried out more frequently if you are overweight and have at least 1 risk factor for diabetes.  Breast cancer screening is essential preventative care for women. You should practice "breast self-awareness." This means understanding the normal appearance and feel of your breasts and may include breast self-examination. Any changes detected, no matter how small, should be reported to a caregiver. Women in their 45s and 30s should have a clinical breast exam (CBE) by a caregiver as part of a regular health exam every 1 to 3 years. After age 25, women should have a CBE every year. Starting at age 75, women should consider having a mammogram (breast X-ray) every year. Women who have a family history of breast cancer should talk to their caregiver about genetic screening. Women at a high risk of breast cancer should talk to their caregiver about having an MRI and a mammogram every year.  Breast cancer gene (BRCA)-related cancer risk assessment is recommended for women who have family members with BRCA-related cancers. BRCA-related cancers include breast, ovarian, tubal, and peritoneal cancers. Having family members with these cancers may be associated with an increased risk for harmful changes (mutations) in the breast cancer genes BRCA1 and BRCA2. Results of the assessment will determine the need for genetic counseling and BRCA1 and BRCA2 testing.  The Pap test is a screening test for cervical cancer. Women should have a Pap test starting at age 96. Between ages 37 and 12, Pap tests should be repeated every 2 years. Beginning at age 61, you should have a Pap test every 3 years as long as the past 3 Pap tests have been normal. If you had a hysterectomy for a problem that  was not cancer or a condition that could lead to cancer, then you no longer need Pap tests. If you are between ages 80 and 93, and you have had normal Pap tests going back 10 years, you no longer need Pap tests. If you have had past treatment for cervical cancer or a condition that could lead to cancer, you need Pap tests and screening for cancer for at least 20 years after your treatment. If Pap tests have been discontinued, risk factors (such as a new sexual partner) need to be reassessed to determine if screening should be resumed. Some women have medical problems that increase the chance of getting cervical cancer. In these cases, your caregiver may recommend more frequent screening and Pap tests.  The human papillomavirus (HPV) test is an additional test that may be used for cervical cancer screening. The HPV test looks for the virus that can cause the cell changes on the cervix. The cells collected during the Pap test can be tested for HPV. The HPV test could be  used to screen women aged 74 years and older, and should be used in women of any age who have unclear Pap test results. After the age of 21, women should have HPV testing at the same frequency as a Pap test.  Colorectal cancer can be detected and often prevented. Most routine colorectal cancer screening begins at the age of 64 and continues through age 36. However, your caregiver may recommend screening at an earlier age if you have risk factors for colon cancer. On a yearly basis, your caregiver may provide home test kits to check for hidden blood in the stool. Use of a small camera at the end of a tube, to directly examine the colon (sigmoidoscopy or colonoscopy), can detect the earliest forms of colorectal cancer. Talk to your caregiver about this at age 61, when routine screening begins. Direct examination of the colon should be repeated every 5 to 10 years through age 62, unless early forms of pre-cancerous polyps or small growths are  found.  Hepatitis C blood testing is recommended for all people born from [birth year] through 1964 and any individual with known risks for hepatitis C.  Practice safe sex. Use condoms and avoid high-risk sexual practices to reduce the spread of sexually transmitted infections (STIs). Sexually active women aged 4 and younger should be checked for Chlamydia, which is a common sexually transmitted infection. Older women with new or multiple partners should also be tested for Chlamydia. Testing for other STIs is recommended if you are sexually active and at increased risk.  Osteoporosis is a disease in which the bones lose minerals and strength with aging. This can result in serious bone fractures. The risk of osteoporosis can be identified using a bone density scan. Women ages 49 and over and women at risk for fractures or osteoporosis should discuss screening with their caregivers. Ask your caregiver whether you should be taking a calcium supplement or vitamin D to reduce the rate of osteoporosis.  Menopause can be associated with physical symptoms and risks. Hormone replacement therapy is available to decrease symptoms and risks. You should talk to your caregiver about whether hormone replacement therapy is right for you.  Use sunscreen. Apply sunscreen liberally and repeatedly throughout the day. You should seek shade when your shadow is shorter than you. Protect yourself by wearing long sleeves, pants, a wide-brimmed hat, and sunglasses year round, whenever you are outdoors.  Notify your caregiver of new moles or changes in moles, especially if there is a change in shape or color. Also notify your caregiver if a mole is larger than the size of a pencil eraser.  Stay current with your immunizations. Document Released: 05/01/2011 Document Revised: 02/10/2013 Document Reviewed: 05/01/2011 [facility] Patient Information 2014 [place].

## 2015-03-22 ENCOUNTER — Encounter: Payer: Self-pay | Admitting: Gynecology

## 2015-03-22 LAB — CYTOLOGY - PAP

## 2015-03-23 ENCOUNTER — Telehealth: Payer: Self-pay | Admitting: *Deleted

## 2015-03-23 NOTE — Telephone Encounter (Signed)
Appointment on 04/07/15 @ 1:20pm with Dr.Ramirez pt informed.

## 2015-03-23 NOTE — Telephone Encounter (Signed)
-----   Message from [name], MD sent at 03/18/2015  4:29 PM EDT ----- Schedule appointment with general surgeon reference large external hemorrhoids.

## 2015-05-04 ENCOUNTER — Other Ambulatory Visit: Payer: Self-pay | Admitting: Family Medicine

## 2015-05-10 ENCOUNTER — Encounter: Payer: Self-pay | Admitting: Gynecology

## 2015-05-10 ENCOUNTER — Ambulatory Visit (INDEPENDENT_AMBULATORY_CARE_PROVIDER_SITE_OTHER): Payer: 59 | Admitting: Gynecology

## 2015-05-10 VITALS — BP 124/76

## 2015-05-10 DIAGNOSIS — R87810 Cervical high risk human papillomavirus (HPV) DNA test positive: Secondary | ICD-10-CM | POA: Diagnosis not present

## 2015-05-10 DIAGNOSIS — N3000 Acute cystitis without hematuria: Secondary | ICD-10-CM

## 2015-05-10 LAB — URINALYSIS W MICROSCOPIC + REFLEX CULTURE
Bilirubin Urine: NEGATIVE
Casts: NONE SEEN
Crystals: NONE SEEN
Glucose, UA: NEGATIVE mg/dL
Ketones, ur: NEGATIVE mg/dL
Nitrite: NEGATIVE
Protein, ur: NEGATIVE mg/dL
Specific Gravity, Urine: 1.02 (ref 1.005–1.030)
Urobilinogen, UA: 0.2 mg/dL (ref 0.0–1.0)
pH: 6 (ref 5.0–8.0)

## 2015-05-10 MED ORDER — NITROFURANTOIN MONOHYD MACRO 100 MG PO CAPS: 100.0000 mg | ORAL_CAPSULE | Freq: Two times a day (BID) | ORAL | Status: DC

## 2015-05-10 NOTE — Patient Instructions (Signed)
Office will call you with biopsy results.  Take the Macrodantin antibiotic twice daily for 7 days. Call if your symptoms persist, worsen or recur.

## 2015-05-10 NOTE — Progress Notes (Signed)
Victoria Mosley [birth year] [id number]        64 y.o.  G1P1001 Presents with 2 issues:  1. Colposcopy appointment. History of normal cytology but positive high-risk HPV, negative subtype 16, 18/45 2015. Repeat Pap smear this year 02/2015 showed positive high-risk HPV normal cytology. Patient was advised for colposcopy appointment. 2. Several days of mild dysuria, urgency incontinence and strong odor. History of frequent UTIs. No low back pain fever chills or significant dysuria. No nausea vomiting diarrhea constipation.  Past medical history,surgical history, problem list, medications, allergies, family history and social history were all reviewed and documented in the EPIC chart.  Directed ROS with pertinent positives and negatives documented in the history of present illness/assessment and plan.  Exam: Kim assistant Filed Vitals:   05/10/15 1604  BP: 124/76   General appearance:  Normal Spine straight without CVA tenderness Abdomen soft nontender without masses guarding rebound Pelvic external BUS vagina with atrophic changes. Cervix atrophic. Bimanual shows uterus grossly normal size midline mobile nontender. Adnexa without masses or tenderness.  Colposcopy performed, acetic acid cleanse, inadequate without TZ seen but no abnormalities. ECC performed, single-toothed tenaculum anterior lip stabilization. Patient tolerated well. Physical Exam  Genitourinary:       Assessment/Plan:  64 y.o. G1P1001 with:  1. Symptoms to suggest UTI with urinalysis consistent with this in a patient with history of recurrent UTIs. Macrobid 100 mg twice a day 7 days. Follow up on culture for sensitivities. Follow up if symptoms persist, worsen or recur. 2. 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 Negative subtype 16, 18/45 last year. Colposcopy is inadequate but normal.  ECC performed. Patient will follow up for these results. If negative and plan follow up Pap smear next year. If  otherwise then will triage based upon results.    [name] MD, 4:30 PM 05/10/2015

## 2015-05-10 NOTE — Addendum Note (Signed)
Addended by: Nelva Nay on: 05/10/2015 04:42 PM   Modules accepted: Orders

## 2015-05-11 LAB — URINE CULTURE
Colony Count: NO GROWTH
Organism ID, Bacteria: NO GROWTH

## 2015-05-13 ENCOUNTER — Telehealth: Payer: Self-pay

## 2015-05-13 NOTE — Telephone Encounter (Signed)
When I called her about path result she asked me about urine culture result.  I told her it was no growth/neg. She asked did you want her to keep taking the Macrobid you prescribed for her?

## 2015-05-13 NOTE — Telephone Encounter (Signed)
Yes I would finish it for completeness and sometimes a culture can be negative but she may have had some mild bacterial irritation within the urethra which antibiotics should help with.

## 2015-05-13 NOTE — Telephone Encounter (Signed)
Patient advised.

## 2015-05-18 ENCOUNTER — Other Ambulatory Visit: Payer: Self-pay | Admitting: Otolaryngology

## 2015-06-28 ENCOUNTER — Ambulatory Visit: Payer: 59 | Admitting: Gynecology

## 2015-07-01 HISTORY — PX: LEEP: SHX91

## 2015-07-15 ENCOUNTER — Ambulatory Visit (INDEPENDENT_AMBULATORY_CARE_PROVIDER_SITE_OTHER): Payer: 59 | Admitting: Gynecology

## 2015-07-15 ENCOUNTER — Encounter: Payer: Self-pay | Admitting: Gynecology

## 2015-07-15 VITALS — BP 122/78

## 2015-07-15 DIAGNOSIS — N879 Dysplasia of cervix uteri, unspecified: Secondary | ICD-10-CM | POA: Diagnosis not present

## 2015-07-15 DIAGNOSIS — R87810 Cervical high risk human papillomavirus (HPV) DNA test positive: Secondary | ICD-10-CM

## 2015-07-15 NOTE — Progress Notes (Signed)
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 I reviewed the procedure with her to include the risks of infection, bleeding requiring retreatment and damage to surrounding tissues including vagina, bladder and rectum. I reviewed the possible risks of infertility, miscarriage and preterm delivery associated with cervical treatments.  The various pathology result possibilities were reviewed with her to include pathology found with complete excision, cut through lesions with need for possible future treatments, as well as no pathology found.  She understands that these lesions are HPV related and that we are not eradicating the virus, with possibilities of recurrences in the future. Patient understands and accepts the above.   Procedure:   A pelvic exam was performed and was normal.   The patient was properly grounded and prepared for the procedure. The cervix was visualized with a LEEP speculum cleansed with acetic acid and re-colposcopy performed. The cervix was circumferentially injected with 2% lidocaine solution with epinephrine 1 / 100,000 dilution, a total of 6 cc's were used. The LEEP generator was set at 35 W cutting 91 W coagulation blend one current. Using the 12 x 15 mm LEEP wand, the LEEP specimen was excised in a single pass. An ECC was performed afterwards.  Ball coagulation was delivered to the LEEP base to achieve hemostasis and prophylactic Monsel's solution was applied. The specimen was cut open at 3 o'clock and pinned open on the cork and sent to pathology. The patient tolerated the procedure well and postoperative instructions were discussed with her and a postoperative instruction sheet was given to her. Patient knows to follow up for pathology results in several days and then we  will discuss the long-term plan as far as follow up screening.   [name] MD, 2:32 PM 07/15/2015

## 2015-07-15 NOTE — Patient Instructions (Addendum)
  Office will call you with biopsy results  Loop Electrosurgical Excision Procedure Care After Refer to this sheet in the next few weeks. These instructions provide you with information on caring for yourself after your procedure. Your caregiver may also give you more specific instructions. Your treatment has been planned according to current medical practices, but problems sometimes occur. Call your caregiver if you have any problems or questions after your procedure. HOME CARE INSTRUCTIONS   Do not use tampons, douche, or have sexual intercourse for 2 weeks or as directed by your caregiver.  Begin normal activities if you have no or minimal cramping or bleeding, unless directed otherwise by your caregiver.  Take your temperature if you feel sick. Write down your temperature on paper, and tell your caregiver if you have a fever.  Take all medicines as directed by your caregiver.  Keep all your follow-up appointments and Pap tests as directed by your caregiver. SEEK IMMEDIATE MEDICAL CARE IF:   You have bleeding that is heavier or longer than a normal menstrual cycle.  You have bleeding that is bright red.  You have blood clots.  You have a fever.  You have increasing cramps or pain not relieved by medicine.  You develop abdominal pain that does not seem to be related to the same area of earlier cramping and pain.  You are lightheaded, unusually weak, or faint.  You develop painful or bloody urination.  You develop a bad smelling vaginal discharge. MAKE SURE YOU:  Understand these instructions.  Will watch your condition.  Will get help right away if you are not doing well or get worse. Document Released: 06/29/2011 Document Revised: 01/08/2012 Document Reviewed: 06/29/2011 [employer] Patient Information 2015 [employer]. This information is not intended to replace advice given to you by your health care provider. Make sure you discuss any questions you have with your  health care provider.  

## 2015-07-16 ENCOUNTER — Other Ambulatory Visit (INDEPENDENT_AMBULATORY_CARE_PROVIDER_SITE_OTHER): Payer: 59

## 2015-07-16 DIAGNOSIS — Z0000 Encounter for general adult medical examination without abnormal findings: Secondary | ICD-10-CM | POA: Diagnosis not present

## 2015-07-16 DIAGNOSIS — R7989 Other specified abnormal findings of blood chemistry: Secondary | ICD-10-CM | POA: Diagnosis not present

## 2015-07-16 LAB — CBC WITH DIFFERENTIAL/PLATELET
Basophils Absolute: 0 10*3/uL (ref 0.0–0.1)
Basophils Relative: 0.3 % (ref 0.0–3.0)
Eosinophils Absolute: 0.1 10*3/uL (ref 0.0–0.7)
Eosinophils Relative: 1.3 % (ref 0.0–5.0)
HCT: 38.4 % (ref 36.0–46.0)
Hemoglobin: 12.4 g/dL (ref 12.0–15.0)
Lymphocytes Relative: 26.4 % (ref 12.0–46.0)
Lymphs Abs: 1.8 10*3/uL (ref 0.7–4.0)
MCHC: 32.2 g/dL (ref 30.0–36.0)
MCV: 75 fl — ABNORMAL LOW (ref 78.0–100.0)
Monocytes Absolute: 0.9 10*3/uL (ref 0.1–1.0)
Monocytes Relative: 13.6 % — ABNORMAL HIGH (ref 3.0–12.0)
Neutro Abs: 3.9 10*3/uL (ref 1.4–7.7)
Neutrophils Relative %: 58.4 % (ref 43.0–77.0)
Platelets: 279 10*3/uL (ref 150.0–400.0)
RBC: 5.11 Mil/uL (ref 3.87–5.11)
RDW: 19 % — ABNORMAL HIGH (ref 11.5–15.5)
WBC: 6.7 10*3/uL (ref 4.0–10.5)

## 2015-07-16 LAB — BASIC METABOLIC PANEL
BUN: 23 mg/dL (ref 6–23)
CO2: 31 mEq/L (ref 19–32)
Calcium: 9.5 mg/dL (ref 8.4–10.5)
Chloride: 103 mEq/L (ref 96–112)
Creatinine, Ser: 0.81 mg/dL (ref 0.40–1.20)
GFR: 75.86 mL/min (ref >60.00)
Glucose, Bld: 102 mg/dL — ABNORMAL HIGH (ref 70–99)
Potassium: 4.1 mEq/L (ref 3.5–5.1)
Sodium: 142 mEq/L (ref 135–145)

## 2015-07-16 LAB — POCT URINALYSIS DIPSTICK
Bilirubin, UA: NEGATIVE
Glucose, UA: NEGATIVE
Ketones, UA: NEGATIVE
Nitrite, UA: NEGATIVE
Spec Grav, UA: 1.025
Urobilinogen, UA: 0.2
pH, UA: 6.5

## 2015-07-16 LAB — LIPID PANEL
Cholesterol: 189 mg/dL (ref 0–200)
HDL: 43.4 mg/dL (ref >39.00)
NonHDL: 145.26
Total CHOL/HDL Ratio: 4
Triglycerides: 233 mg/dL — ABNORMAL HIGH (ref 0.0–149.0)
VLDL: 46.6 mg/dL — ABNORMAL HIGH (ref 0.0–40.0)

## 2015-07-16 LAB — HEPATIC FUNCTION PANEL
ALT: 16 U/L (ref 0–35)
AST: 16 U/L (ref 0–37)
Albumin: 3.9 g/dL (ref 3.5–5.2)
Alkaline Phosphatase: 65 U/L (ref 39–117)
Bilirubin, Direct: 0.1 mg/dL (ref 0.0–0.3)
Total Bilirubin: 0.3 mg/dL (ref 0.2–1.2)
Total Protein: 6.7 g/dL (ref 6.0–8.3)

## 2015-07-16 LAB — LDL CHOLESTEROL, DIRECT: Direct LDL: 128 mg/dL

## 2015-07-16 LAB — TSH: TSH: 2.3 u[IU]/mL (ref 0.35–4.50)

## 2015-07-19 ENCOUNTER — Encounter: Payer: Self-pay | Admitting: Gynecology

## 2015-07-21 MED ORDER — NITROFURANTOIN MONOHYD MACRO 100 MG PO CAPS: 100.0000 mg | ORAL_CAPSULE | Freq: Two times a day (BID) | ORAL | Status: DC

## 2015-07-21 NOTE — Addendum Note (Signed)
Addended by: Aggie Hacker A on: 07/21/2015 10:44 AM   Modules accepted: Orders

## 2015-07-29 ENCOUNTER — Ambulatory Visit (INDEPENDENT_AMBULATORY_CARE_PROVIDER_SITE_OTHER): Payer: 59 | Admitting: Family Medicine

## 2015-07-29 ENCOUNTER — Encounter: Payer: Self-pay | Admitting: Family Medicine

## 2015-07-29 VITALS — BP 115/69 | HR 75 | Temp 99.3°F | Ht 67.0 in | Wt 195.0 lb

## 2015-07-29 DIAGNOSIS — Z23 Encounter for immunization: Secondary | ICD-10-CM | POA: Diagnosis not present

## 2015-07-29 DIAGNOSIS — Z0000 Encounter for general adult medical examination without abnormal findings: Secondary | ICD-10-CM | POA: Diagnosis not present

## 2015-07-29 MED ORDER — HYOSCYAMINE SULFATE 0.125 MG SL SUBL: SUBLINGUAL_TABLET | SUBLINGUAL | Status: DC

## 2015-07-29 MED ORDER — HYDROCHLOROTHIAZIDE 25 MG PO TABS: 25.0000 mg | ORAL_TABLET | Freq: Every day | ORAL | Status: DC

## 2015-07-29 MED ORDER — PREDNISONE 10 MG PO TABS: 10.0000 mg | ORAL_TABLET | Freq: Every day | ORAL | Status: DC

## 2015-07-29 MED ORDER — FIORICET 50-300-40 MG PO CAPS: 1.0000 | ORAL_CAPSULE | Freq: Two times a day (BID) | ORAL | Status: DC

## 2015-07-29 MED ORDER — EZETIMIBE 10 MG PO TABS: 10.0000 mg | ORAL_TABLET | Freq: Every day | ORAL | Status: DC

## 2015-07-29 MED ORDER — PROPRANOLOL HCL 40 MG PO TABS: ORAL_TABLET | ORAL | Status: DC

## 2015-07-29 MED ORDER — BUTALBITAL-APAP-CAFFEINE 50-300-40 MG PO CAPS: 1.0000 | ORAL_CAPSULE | Freq: Two times a day (BID) | ORAL | Status: DC

## 2015-07-29 MED ORDER — DIAZEPAM 5 MG PO TABS: 5.0000 mg | ORAL_TABLET | Freq: Four times a day (QID) | ORAL | Status: DC | PRN

## 2015-07-29 MED ORDER — BETAMETHASONE VALERATE 0.1 % EX LOTN: TOPICAL_LOTION | Freq: Two times a day (BID) | CUTANEOUS | Status: DC

## 2015-07-29 MED ORDER — OMEPRAZOLE 20 MG PO CPDR: 20.0000 mg | DELAYED_RELEASE_CAPSULE | Freq: Every day | ORAL | Status: DC

## 2015-07-29 NOTE — Progress Notes (Signed)
   Subjective:    Patient ID: Victoria Mosley, female    DOB: 1951-05-21, 64 y.o.   MRN: [id number]  HPI 64 yr old female for a cpx. She feels well today but she has a rough year health wise. She has had a right total knee replacement, she has been seeing Dr. 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 Tonita Cong, and she has had hemorrhoidal banding twice. She is taking Zetia for her lipids, and this has actually been very successful.    Review of Systems  Constitutional: Negative.   HENT: Negative.   Eyes: Negative.   Respiratory: Negative.   Cardiovascular: Negative.   Gastrointestinal: Negative.   Genitourinary: Negative for dysuria, urgency, frequency, hematuria, flank pain, decreased urine volume, enuresis, difficulty urinating, pelvic pain and dyspareunia.  Musculoskeletal: Negative.   Skin: Negative.   Neurological: Negative.   Psychiatric/Behavioral: Negative.        Objective:   Physical Exam  Constitutional: She is oriented to person, place, and time. She appears well-developed and well-nourished. No distress.  HENT:  Head: Normocephalic and atraumatic.  Right Ear: External ear normal.  Left Ear: External ear normal.  Nose: Nose normal.  Mouth/Throat: Oropharynx is clear and moist. No oropharyngeal exudate.  Eyes: Conjunctivae and EOM are normal. Pupils are equal, round, and reactive to light. No scleral icterus.  Neck: Normal range of motion. Neck supple. No JVD present. No thyromegaly present.  Cardiovascular: Normal rate, regular rhythm, normal heart sounds and intact distal pulses.  Exam reveals no gallop and no friction rub.   No murmur heard. EKG normal   Pulmonary/Chest: Effort normal and breath sounds normal. No respiratory distress. She has no wheezes. She has no rales. She exhibits no tenderness.  Abdominal: Soft. Bowel  sounds are normal. She exhibits no distension and no mass. There is no tenderness. There is no rebound and no guarding.  Musculoskeletal: Normal range of motion. She exhibits no edema or tenderness.  Lymphadenopathy:    She has no cervical adenopathy.  Neurological: She is alert and oriented to person, place, and time. She has normal reflexes. No cranial nerve deficit. She exhibits normal muscle tone. Coordination normal.  Skin: Skin is warm and dry. No rash noted. No erythema.  Psychiatric: She has a normal mood and affect. Her behavior is normal. Judgment and thought content normal.          Assessment & Plan:  Well exam. She is doing well in general. We will check for Hep. C. Given a Prevnar shot.

## 2015-07-29 NOTE — Addendum Note (Signed)
Addended by: Aggie Hacker A on: 07/29/2015 11:16 AM   Modules accepted: Orders

## 2015-07-29 NOTE — Progress Notes (Signed)
Pre visit review using our clinic review tool, if applicable. No additional management support is needed unless otherwise documented below in the visit note. 

## 2015-07-29 NOTE — Addendum Note (Signed)
Addended by: Alysia Penna A on: 07/29/2015 11:13 AM   Modules accepted: Orders, Medications

## 2015-07-30 LAB — HEPATITIS C ANTIBODY: HCV Ab: NEGATIVE

## 2015-08-19 ENCOUNTER — Encounter: Payer: Self-pay | Admitting: Internal Medicine

## 2015-10-28 ENCOUNTER — Telehealth: Payer: Self-pay | Admitting: Family Medicine

## 2015-10-28 DIAGNOSIS — M810 Age-related osteoporosis without current pathological fracture: Secondary | ICD-10-CM

## 2015-10-28 NOTE — Telephone Encounter (Signed)
Pt said she gets a bone density test every 25 months. Pt said she is due one around 12/14/15. We need an order please

## 2015-10-29 NOTE — Telephone Encounter (Signed)
I ordered the DEXA.

## 2015-10-29 NOTE — Telephone Encounter (Signed)
Patient notified that DEXA scan has been ordered. Thanks!

## 2015-10-31 HISTORY — PX: NASAL/SINUS ENDOSCOPY: SHX288

## 2015-12-06 LAB — HM MAMMOGRAPHY

## 2015-12-07 ENCOUNTER — Encounter: Payer: Self-pay | Admitting: Gynecology

## 2015-12-07 ENCOUNTER — Encounter: Payer: Self-pay | Admitting: Family Medicine

## 2015-12-31 ENCOUNTER — Ambulatory Visit (INDEPENDENT_AMBULATORY_CARE_PROVIDER_SITE_OTHER)
Admission: RE | Admit: 2015-12-31 | Discharge: 2015-12-31 | Disposition: A | Discharge status: Home / Self Care | Payer: 59 | Source: Ambulatory Visit | Attending: Family Medicine | Admitting: Family Medicine

## 2015-12-31 DIAGNOSIS — M810 Age-related osteoporosis without current pathological fracture: Secondary | ICD-10-CM | POA: Diagnosis not present

## 2016-01-23 ENCOUNTER — Other Ambulatory Visit: Payer: Self-pay | Admitting: Family Medicine

## 2016-01-29 DIAGNOSIS — A419 Sepsis, unspecified organism: Secondary | ICD-10-CM

## 2016-01-29 HISTORY — DX: Sepsis, unspecified organism: A41.9

## 2016-02-02 ENCOUNTER — Ambulatory Visit (INDEPENDENT_AMBULATORY_CARE_PROVIDER_SITE_OTHER): Payer: 59 | Admitting: Family Medicine

## 2016-02-02 ENCOUNTER — Encounter: Payer: Self-pay | Admitting: Family Medicine

## 2016-02-02 VITALS — BP 120/73 | HR 90 | Temp 98.5°F | Ht 67.0 in | Wt 200.0 lb

## 2016-02-02 DIAGNOSIS — N390 Urinary tract infection, site not specified: Secondary | ICD-10-CM

## 2016-02-02 DIAGNOSIS — R8290 Unspecified abnormal findings in urine: Secondary | ICD-10-CM

## 2016-02-02 DIAGNOSIS — J209 Acute bronchitis, unspecified: Secondary | ICD-10-CM

## 2016-02-02 LAB — POC URINALSYSI DIPSTICK (AUTOMATED)
Bilirubin, UA: NEGATIVE
Blood, UA: NEGATIVE
Glucose, UA: NEGATIVE
Ketones, UA: NEGATIVE
Nitrite, UA: NEGATIVE
Protein, UA: NEGATIVE
Spec Grav, UA: 1.01
Urobilinogen, UA: 0.2
pH, UA: 6

## 2016-02-02 MED ORDER — AMOXICILLIN-POT CLAVULANATE 875-125 MG PO TABS: 1.0000 | ORAL_TABLET | Freq: Two times a day (BID) | ORAL | Status: DC

## 2016-02-02 NOTE — Progress Notes (Signed)
Pre visit review using our clinic review tool, if applicable. No additional management support is needed unless otherwise documented below in the visit note. 

## 2016-02-02 NOTE — Progress Notes (Signed)
   Subjective:    Patient ID: Victoria Mosley, female    DOB: 06-23-1951, 65 y.o.   MRN: [id number]  HPI Here for 2 weeks of chest congestion and coughing up yellow sputum. No fever. Also for 4 days she has had cloudy urine with a foul odor. No burning or urgency.    Review of Systems  Constitutional: Negative.   HENT: Positive for congestion and postnasal drip. Negative for sinus pressure and sore throat.   Eyes: Negative.   Respiratory: Positive for cough and chest tightness. Negative for shortness of breath and wheezing.   Cardiovascular: Negative.   Genitourinary: Negative for urgency, hematuria and flank pain.       Objective:   Physical Exam  Constitutional: She appears well-developed and well-nourished.  HENT:  Right Ear: External ear normal.  Left Ear: External ear normal.  Nose: Nose normal.  Mouth/Throat: Oropharynx is clear and moist.  Eyes: Conjunctivae are normal.  Neck: No thyromegaly present.  Pulmonary/Chest: Effort normal. No respiratory distress. She has no wheezes. She has no rales.  Abdominal: Soft. Bowel sounds are normal. She exhibits no distension and no mass. There is no tenderness. There is no rebound and no guarding.  Lymphadenopathy:    She has no cervical adenopathy.          Assessment & Plan:  Bronchitis, treat with Augmentin. This should cover the UTI as well. Drink plenty of water.  Laurey Morale, MD

## 2016-02-04 LAB — URINE CULTURE: Colony Count: >=100000

## 2016-02-28 ENCOUNTER — Ambulatory Visit (INDEPENDENT_AMBULATORY_CARE_PROVIDER_SITE_OTHER): Payer: 59 | Admitting: Family Medicine

## 2016-02-28 ENCOUNTER — Encounter: Payer: Self-pay | Admitting: Family Medicine

## 2016-02-28 VITALS — BP 128/68 | HR 93 | Temp 99.1°F | Ht 67.0 in | Wt 197.0 lb

## 2016-02-28 DIAGNOSIS — J329 Chronic sinusitis, unspecified: Secondary | ICD-10-CM

## 2016-02-28 MED ORDER — CEFUROXIME AXETIL 500 MG PO TABS: 500.0000 mg | ORAL_TABLET | Freq: Two times a day (BID) | ORAL | Status: DC

## 2016-02-28 NOTE — Progress Notes (Signed)
   Subjective:    Patient ID: Victoria Mosley, female    DOB: 1951/10/07, 64 y.o.   MRN: [id number]  HPI Here for ongoing sinus congestion, pressure in the head, dizziness, PND, and a foul odor in the nose and taste in the mouth. No fever. She took a course of Augmentin a month ago and this helped, but now the symptoms are back. She has tried using a [employer], Sudafed, Mucinex, steroid nasal sprays, and Benadryl with no effect.    Review of Systems  Constitutional: Negative.   HENT: Positive for congestion, nosebleeds, postnasal drip, sinus pressure and sore throat. Negative for ear pain and facial swelling.   Eyes: Negative.   Respiratory: Negative.   Neurological: Positive for dizziness. Negative for tremors, seizures, syncope, facial asymmetry, speech difficulty, weakness, light-headedness, numbness and headaches.       Objective:   Physical Exam  Constitutional: She is oriented to person, place, and time. She appears well-developed and well-nourished.  HENT:  Right Ear: External ear normal.  Left Ear: External ear normal.  Nose: Nose normal.  Mouth/Throat: Oropharynx is clear and moist.  Eyes: Conjunctivae are normal.  Neck: Neck supple. No thyromegaly present.  Cardiovascular: Normal rate, regular rhythm, normal heart sounds and intact distal pulses.   Pulmonary/Chest: Effort normal and breath sounds normal.  Lymphadenopathy:    She has no cervical adenopathy.  Neurological: She is alert and oriented to person, place, and time. No cranial nerve deficit.          Assessment & Plan:  Chronic sinusitis. Treat with Ceftin. Set up a limited CT scan of the sinuses.  Laurey Morale, MD

## 2016-02-28 NOTE — Progress Notes (Signed)
Pre visit review using our clinic review tool, if applicable. No additional management support is needed unless otherwise documented below in the visit note. 

## 2016-02-29 ENCOUNTER — Other Ambulatory Visit: Payer: Self-pay

## 2016-02-29 MED ORDER — ESTROPIPATE 0.75 MG PO TABS: 0.7500 mg | ORAL_TABLET | Freq: Every day | ORAL | Status: DC

## 2016-03-02 ENCOUNTER — Ambulatory Visit (INDEPENDENT_AMBULATORY_CARE_PROVIDER_SITE_OTHER)
Admission: RE | Admit: 2016-03-02 | Discharge: 2016-03-02 | Disposition: A | Discharge status: Home / Self Care | Payer: 59 | Source: Ambulatory Visit | Attending: Family Medicine | Admitting: Family Medicine

## 2016-03-02 DIAGNOSIS — J329 Chronic sinusitis, unspecified: Secondary | ICD-10-CM | POA: Diagnosis not present

## 2016-03-03 ENCOUNTER — Telehealth: Payer: Self-pay | Admitting: Family Medicine

## 2016-03-03 NOTE — Telephone Encounter (Signed)
Pt called office to see what the plan is for her after having her CT.  Pls return the call.

## 2016-03-03 NOTE — Telephone Encounter (Signed)
I spoke with pt and gave results.  

## 2016-03-30 ENCOUNTER — Telehealth: Payer: Self-pay | Admitting: Family Medicine

## 2016-03-30 DIAGNOSIS — J329 Chronic sinusitis, unspecified: Secondary | ICD-10-CM

## 2016-03-30 HISTORY — PX: NASAL/SINUS ENDOSCOPY: SHX288

## 2016-03-30 NOTE — Telephone Encounter (Signed)
Pt states her sinus issues are still not resolved. Pt has seen Dr Sarajane Jews and had several rounds of abx. At this point, pt would like dr Sarajane Jews to refer her to [facility] ENT Dr Melissa Montane

## 2016-03-31 NOTE — Telephone Encounter (Signed)
Okay to send referral for ENT?

## 2016-04-03 NOTE — Telephone Encounter (Signed)
Referral was done  

## 2016-04-06 ENCOUNTER — Other Ambulatory Visit: Payer: Self-pay | Admitting: Gynecology

## 2016-04-06 ENCOUNTER — Encounter: Payer: Self-pay | Admitting: Gynecology

## 2016-04-06 ENCOUNTER — Ambulatory Visit (INDEPENDENT_AMBULATORY_CARE_PROVIDER_SITE_OTHER): Payer: 59 | Admitting: Gynecology

## 2016-04-06 VITALS — BP 112/64 | Ht 67.0 in | Wt 198.0 lb

## 2016-04-06 DIAGNOSIS — J322 Chronic ethmoidal sinusitis: Secondary | ICD-10-CM | POA: Diagnosis not present

## 2016-04-06 DIAGNOSIS — Z01419 Encounter for gynecological examination (general) (routine) without abnormal findings: Secondary | ICD-10-CM | POA: Diagnosis not present

## 2016-04-06 DIAGNOSIS — IMO0002 Reserved for concepts with insufficient information to code with codable children: Secondary | ICD-10-CM

## 2016-04-06 DIAGNOSIS — R300 Dysuria: Secondary | ICD-10-CM | POA: Diagnosis not present

## 2016-04-06 DIAGNOSIS — R896 Abnormal cytological findings in specimens from other organs, systems and tissues: Secondary | ICD-10-CM

## 2016-04-06 DIAGNOSIS — N952 Postmenopausal atrophic vaginitis: Secondary | ICD-10-CM

## 2016-04-06 DIAGNOSIS — Z79890 Hormone replacement therapy (postmenopausal): Secondary | ICD-10-CM

## 2016-04-06 LAB — URINALYSIS W MICROSCOPIC + REFLEX CULTURE
Bilirubin Urine: NEGATIVE
Casts: NONE SEEN [LPF]
Crystals: NONE SEEN [HPF]
Glucose, UA: NEGATIVE
Ketones, ur: NEGATIVE
Nitrite: POSITIVE — AB
Specific Gravity, Urine: 1.02 (ref 1.001–1.035)
Yeast: NONE SEEN [HPF]
pH: 7 (ref 5.0–8.0)

## 2016-04-06 MED ORDER — AMOXICILLIN-POT CLAVULANATE 500-125 MG PO TABS: 1.0000 | ORAL_TABLET | Freq: Two times a day (BID) | ORAL | Status: DC

## 2016-04-06 MED ORDER — ESTROPIPATE 0.75 MG PO TABS: 0.7500 mg | ORAL_TABLET | Freq: Every day | ORAL | Status: DC

## 2016-04-06 MED ORDER — MEDROXYPROGESTERONE ACETATE 2.5 MG PO TABS: 2.5000 mg | ORAL_TABLET | Freq: Every day | ORAL | Status: DC

## 2016-04-06 NOTE — Addendum Note (Signed)
Addended by: Nelva Nay on: 04/06/2016 04:38 PM   Modules accepted: Orders, SmartSet

## 2016-04-06 NOTE — Patient Instructions (Signed)
Take the Augmentin antibiotic twice daily for 2 weeks.  You may obtain a copy of any labs that were done today by logging onto MyChart as outlined in the instructions provided with your AVS (after visit summary). The office will not call with normal lab results but certainly if there are any significant abnormalities then we will contact you.   Health Maintenance Adopting a healthy lifestyle and getting preventive care can go a long way to promote health and wellness. Talk with your health care provider about what schedule of regular examinations is right for you. This is a good chance for you to check in with your provider about disease prevention and staying healthy. In between checkups, there are plenty of things you can do on your own. Experts have done a lot of research about which lifestyle changes and preventive measures are most likely to keep you healthy. Ask your health care provider for more information. WEIGHT AND DIET  Eat a healthy diet  Be sure to include plenty of vegetables, fruits, low-fat dairy products, and lean protein.  Do not eat a lot of foods high in solid fats, added sugars, or salt.  Get regular exercise. This is one of the most important things you can do for your health.  Most adults should exercise for at least 150 minutes each week. The exercise should increase your heart rate and make you sweat (moderate-intensity exercise).  Most adults should also do strengthening exercises at least twice a week. This is in addition to the moderate-intensity exercise.  Maintain a healthy weight  Body mass index (BMI) is a measurement that can be used to identify possible weight problems. It estimates body fat based on height and weight. Your health care provider can help determine your BMI and help you achieve or maintain a healthy weight.  For females 67 years of age and older:   A BMI below 18.5 is considered underweight.  A BMI of 18.5 to 24.9 is normal.  A BMI of  25 to 29.9 is considered overweight.  A BMI of 30 and above is considered obese.  Watch levels of cholesterol and blood lipids  You should start having your blood tested for lipids and cholesterol at 64 years of age, then have this test every 5 years.  You may need to have your cholesterol levels checked more often if:  Your lipid or cholesterol levels are high.  You are older than 64 years of age.  You are at high risk for heart disease.  CANCER SCREENING   Lung Cancer  Lung cancer screening is recommended for adults 49-66 years old who are at high risk for lung cancer because of a history of smoking.  A yearly low-dose CT scan of the lungs is recommended for people who:  Currently smoke.  Have quit within the past 15 years.  Have at least a 30-pack-year history of smoking. A pack year is smoking an average of one pack of cigarettes a day for 1 year.  Yearly screening should continue until it has been 15 years since you quit.  Yearly screening should stop if you develop a health problem that would prevent you from having lung cancer treatment.  Breast Cancer  Practice breast self-awareness. This means understanding how your breasts normally appear and feel.  It also means doing regular breast self-exams. Let your health care provider know about any changes, no matter how small.  If you are in your 20s or 30s, you should have a clinical  breast exam (CBE) by a health care provider every 1-3 years as part of a regular health exam.  If you are 40 or older, have a CBE every year. Also consider having a breast X-ray (mammogram) every year.  If you have a family history of breast cancer, talk to your health care provider about genetic screening.  If you are at high risk for breast cancer, talk to your health care provider about having an MRI and a mammogram every year.  Breast cancer gene (BRCA) assessment is recommended for women who have family members with BRCA-related  cancers. BRCA-related cancers include:  Breast.  Ovarian.  Tubal.  Peritoneal cancers.  Results of the assessment will determine the need for genetic counseling and BRCA1 and BRCA2 testing. Cervical Cancer Routine pelvic examinations to screen for cervical cancer are no longer recommended for nonpregnant women who are considered low risk for cancer of the pelvic organs (ovaries, uterus, and vagina) and who do not have symptoms. A pelvic examination may be necessary if you have symptoms including those associated with pelvic infections. Ask your health care provider if a screening pelvic exam is right for you.   The Pap test is the screening test for cervical cancer for women who are considered at risk.  If you had a hysterectomy for a problem that was not cancer or a condition that could lead to cancer, then you no longer need Pap tests.  If you are older than 65 years, and you have had normal Pap tests for the past 10 years, you no longer need to have Pap tests.  If you have had past treatment for cervical cancer or a condition that could lead to cancer, you need Pap tests and screening for cancer for at least 20 years after your treatment.  If you no longer get a Pap test, assess your risk factors if they change (such as having a new sexual partner). This can affect whether you should start being screened again.  Some women have medical problems that increase their chance of getting cervical cancer. If this is the case for you, your health care provider may recommend more frequent screening and Pap tests.  The human papillomavirus (HPV) test is another test that may be used for cervical cancer screening. The HPV test looks for the virus that can cause cell changes in the cervix. The cells collected during the Pap test can be tested for HPV.  The HPV test can be used to screen women 30 years of age and older. Getting tested for HPV can extend the interval between normal Pap tests from  three to five years.  An HPV test also should be used to screen women of any age who have unclear Pap test results.  After 64 years of age, women should have HPV testing as often as Pap tests.  Colorectal Cancer  This type of cancer can be detected and often prevented.  Routine colorectal cancer screening usually begins at 64 years of age and continues through 64 years of age.  Your health care provider may recommend screening at an earlier age if you have risk factors for colon cancer.  Your health care provider may also recommend using home test kits to check for hidden blood in the stool.  A small camera at the end of a tube can be used to examine your colon directly (sigmoidoscopy or colonoscopy). This is done to check for the earliest forms of colorectal cancer.  Routine screening usually begins at age   50.  Direct examination of the colon should be repeated every 5-10 years through 64 years of age. However, you may need to be screened more often if early forms of precancerous polyps or small growths are found. Skin Cancer  Check your skin from head to toe regularly.  Tell your health care provider about any new moles or changes in moles, especially if there is a change in a mole's shape or color.  Also tell your health care provider if you have a mole that is larger than the size of a pencil eraser.  Always use sunscreen. Apply sunscreen liberally and repeatedly throughout the day.  Protect yourself by wearing long sleeves, pants, a wide-brimmed hat, and sunglasses whenever you are outside. HEART DISEASE, DIABETES, AND HIGH BLOOD PRESSURE   Have your blood pressure checked at least every 1-2 years. High blood pressure causes heart disease and increases the risk of stroke.  If you are between 70 years and 59 years old, ask your health care provider if you should take aspirin to prevent strokes.  Have regular diabetes screenings. This involves taking a blood sample to check  your fasting blood sugar level.  If you are at a normal weight and have a low risk for diabetes, have this test once every three years after 63 years of age.  If you are overweight and have a high risk for diabetes, consider being tested at a younger age or more often. PREVENTING INFECTION  Hepatitis B  If you have a higher risk for hepatitis B, you should be screened for this virus. You are considered at high risk for hepatitis B if:  You were born in a country where hepatitis B is common. Ask your health care provider which countries are considered high risk.  Your parents were born in a high-risk country, and you have not been immunized against hepatitis B (hepatitis B vaccine).  You have HIV or AIDS.  You use needles to inject street drugs.  You live with someone who has hepatitis B.  You have had sex with someone who has hepatitis B.  You get hemodialysis treatment.  You take certain medicines for conditions, including cancer, organ transplantation, and autoimmune conditions. Hepatitis C  Blood testing is recommended for:  Everyone born from [birth year] through 1964.  Anyone with known risk factors for hepatitis C. Sexually transmitted infections (STIs)  You should be screened for sexually transmitted infections (STIs) including gonorrhea and chlamydia if:  You are sexually active and are younger than 64 years of age.  You are older than 64 years of age and your health care provider tells you that you are at risk for this type of infection.  Your sexual activity has changed since you were last screened and you are at an increased risk for chlamydia or gonorrhea. Ask your health care provider if you are at risk.  If you do not have HIV, but are at risk, it may be recommended that you take a prescription medicine daily to prevent HIV infection. This is called pre-exposure prophylaxis (PrEP). You are considered at risk if:  You are sexually active and do not regularly use  condoms or know the HIV status of your partner(s).  You take drugs by injection.  You are sexually active with a partner who has HIV. Talk with your health care provider about whether you are at high risk of being infected with HIV. If you choose to begin PrEP, you should first be tested for HIV. You should  then be tested every 3 months for as long as you are taking PrEP.  PREGNANCY   If you are premenopausal and you may become pregnant, ask your health care provider about preconception counseling.  If you may become pregnant, take 400 to 800 micrograms (mcg) of folic acid every day.  If you want to prevent pregnancy, talk to your health care provider about birth control (contraception). OSTEOPOROSIS AND MENOPAUSE   Osteoporosis is a disease in which the bones lose minerals and strength with aging. This can result in serious bone fractures. Your risk for osteoporosis can be identified using a bone density scan.  If you are 53 years of age or older, or if you are at risk for osteoporosis and fractures, ask your health care provider if you should be screened.  Ask your health care provider whether you should take a calcium or vitamin D supplement to lower your risk for osteoporosis.  Menopause may have certain physical symptoms and risks.  Hormone replacement therapy may reduce some of these symptoms and risks. Talk to your health care provider about whether hormone replacement therapy is right for you.  HOME CARE INSTRUCTIONS   Schedule regular health, dental, and eye exams.  Stay current with your immunizations.   Do not use any tobacco products including cigarettes, chewing tobacco, or electronic cigarettes.  If you are pregnant, do not drink alcohol.  If you are breastfeeding, limit how much and how often you drink alcohol.  Limit alcohol intake to no more than 1 drink per day for nonpregnant women. One drink equals 12 ounces of beer, 5 ounces of wine, or 1 ounces of hard  liquor.  Do not use street drugs.  Do not share needles.  Ask your health care provider for help if you need support or information about quitting drugs.  Tell your health care provider if you often feel depressed.  Tell your health care provider if you have ever been abused or do not feel safe at home. Document Released: 05/01/2011 Document Revised: 03/02/2014 Document Reviewed: 09/17/2013 [facility] Patient Information 2015 [place], [place]. This information is not intended to replace advice given to you by your health care provider. Make sure you discuss any questions you have with your health care provider.

## 2016-04-06 NOTE — Progress Notes (Signed)
    Victoria Mosley 05-15-1951 [id number]        66 y.o.  G1P1001  for annual exam.  Also has several other issues as discussed below.  Past medical history,surgical history, problem list, medications, allergies, family history and social history were all reviewed and documented as reviewed in the EPIC chart.  ROS:  Performed with pertinent positives and negatives included in the history, assessment and plan.   Additional significant findings :  Sinusitis and UTI symptoms as discussed below   Exam: [name] assistant Filed Vitals:   04/06/16 1552  BP: 112/64  Height: 5\' 7"  (1.702 m)  Weight: 198 lb (89.812 kg)   General appearance:  Normal affect, orientation and appearance. Skin: Grossly normal HEENT: Without gross lesions.  No cervical or supraclavicular adenopathy. Thyroid normal.  Lungs:  Clear without wheezing, rales or rhonchi Cardiac: RR, without RMG Abdominal:  Soft, nontender, without masses, guarding, rebound, organomegaly or hernia Breasts:  Examined lying and sitting without masses, retractions, discharge or axillary adenopathy. Pelvic:  Ext/BUS/vagina With atrophic changes  Cervix high in the vagina Pap smear/HPV done  Uterus anteverted, normal size, shape and contour, midline and mobile nontender   Adnexa without masses or tenderness    Anus and perineum normal excepting old hemorrhoids  Rectovaginal normal sphincter tone without palpated masses or tenderness.    Assessment/Plan:  65 y.o. G45P1001 female for annual exam.   1. Postmenopausal/HRT. Patient continues on Ogen 0.75 and Provera 2.5 daily. Has tried weaning but had unacceptable side effects. I again reviewed the risks particular with advancing age to include stroke heart attack DVT and breast cancer. Patient clearly understands the issues as well as the ACOG/NAMS statements for lowest dose per shortest period of time. She feels it is a quality of life issue and wants to continue. Refill 1 year provided.  She's done no vaginal bleeding and knows to report any bleeding. 2. 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 Pap smear/HPV done today. 3. Mammography 12/2015. Continue with annual mammography when due. SBE monthly reviewed. 4. DEXA 2017 normal.  Recommend repeat at 5 year interval. 5. Colonoscopy 2013. Repeat at their recommended interval. 6. History of chronic sinusitis on the left. Has appointment with ENT in several weeks. Has had several courses of antibiotics. Still with drainage and minimal discomfort.  Exam without evidence of cellulitis or significant tenderness.  We'll treat with Augmentin 500/125 twice daily for 2 weeks. Follow up with ENT that she already has appointment for. 7. Urinary frequency and dysuria over the last several weeks on and off. 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 Uses Macrobid postcoitally.  Exam without significant tenderness. No fever, chills or low back pain.  Urinalysis does show packed WBC with many bacteria.  Will cover with the Augmentin as prescribed above and follow up with urine culture. 8. Health maintenance. No routine lab work done as this is done at her primary physician's office.   [name] MD, 4:23 PM 04/06/2016

## 2016-04-07 ENCOUNTER — Telehealth: Payer: Self-pay | Admitting: *Deleted

## 2016-04-07 LAB — PAP IG W/ RFLX HPV ASCU

## 2016-04-07 LAB — URINE CULTURE: Colony Count: >=100000

## 2016-04-07 NOTE — Telephone Encounter (Signed)
Received prior authorization request from [facility] for Fioricet 50-300-40 mg, called insurance which stated that they do not cover brand name, name brand of Rx is not even in their system.  Notified pt, advised her that she can try the generic or pay out of pocket.

## 2016-04-11 ENCOUNTER — Other Ambulatory Visit: Payer: Self-pay | Admitting: General Practice

## 2016-04-11 LAB — HPV MRNA, HIGH RISK, RFLX 16,18/45: HPV mRNA, High Risk: DETECTED — AB

## 2016-04-12 ENCOUNTER — Encounter: Payer: Self-pay | Admitting: Gynecology

## 2016-04-12 LAB — HPV TYPE 16 AND 18/45 RNA
HPV Type 16 RNA: NOT DETECTED
HPV Type 18/45 RNA: NOT DETECTED

## 2016-04-12 MED ORDER — BUTALBITAL-APAP-CAFFEINE 50-325-40 MG PO TABS: 1.0000 | ORAL_TABLET | Freq: Two times a day (BID) | ORAL | Status: DC | PRN

## 2016-04-12 NOTE — Telephone Encounter (Signed)
Pt has agreed to try the generic brand of Fioricet again.  Please resend Rx to pharmacy with generic option

## 2016-04-12 NOTE — Telephone Encounter (Signed)
I wrote for generic as requested

## 2016-04-12 NOTE — Addendum Note (Signed)
Addended by: Alysia Penna A on: 04/12/2016 11:47 AM   Modules accepted: Orders

## 2016-04-17 DIAGNOSIS — R42 Dizziness and giddiness: Secondary | ICD-10-CM | POA: Insufficient documentation

## 2016-04-20 ENCOUNTER — Other Ambulatory Visit: Payer: Self-pay | Admitting: Otolaryngology

## 2016-04-20 ENCOUNTER — Ambulatory Visit
Admission: RE | Admit: 2016-04-20 | Discharge: 2016-04-20 | Disposition: A | Discharge status: Home / Self Care | Payer: 59 | Source: Ambulatory Visit | Attending: Otolaryngology | Admitting: Otolaryngology

## 2016-04-20 DIAGNOSIS — J329 Chronic sinusitis, unspecified: Secondary | ICD-10-CM

## 2016-04-28 ENCOUNTER — Other Ambulatory Visit: Payer: Self-pay | Admitting: Otolaryngology

## 2016-06-08 ENCOUNTER — Other Ambulatory Visit: Payer: Self-pay | Admitting: Family Medicine

## 2016-06-08 NOTE — Telephone Encounter (Signed)
Pt needs prior auth on her brand name  FIORICET, ESGIC 50-325-40 MG tablet  pt states they do not cover because they do not have in their system. [facility] are going to work with her to get in their system. They sent the prior request, with no name of the drug (because not in their system) But it has pt's name on it. Pt given ID [id number]  So we need to write in the drug name on prior auth and send in.  Pt has tried and failed the generic, and will have to pay $450 out of pocket)

## 2016-06-09 NOTE — Telephone Encounter (Signed)
Pt following up on prior auth request.

## 2016-06-12 ENCOUNTER — Telehealth: Payer: Self-pay

## 2016-06-12 NOTE — Telephone Encounter (Signed)
Pt calling to check the status of the PA that Roselyn Reef was sending it in electronically.  [facility] Database administrator) [email or web address]   Send it to this electronically.

## 2016-06-12 NOTE — Telephone Encounter (Signed)
Prior Authorization completed for brand name Fioricet.   Key: Griselda Miner

## 2016-06-12 NOTE — Telephone Encounter (Signed)
Pt state the she is able to get 90 day supply through mail order   Pharm:  [facility] ([address] [employer]) it is cheaper.

## 2016-06-12 NOTE — Telephone Encounter (Signed)
Prior Authorization for brand name butalbital-acetaminophen-caffeine (FIORICET, ESGIC) 50-325-40 MG tablet completed  Key: [id number]

## 2016-06-12 NOTE — Telephone Encounter (Signed)
Prior Authorization for butalbital-acetaminophen-caffeine (FIORICET, ESGIC) 50-325-40 MG tablet  Completed.  [id number] - PA Case ID: [id number]   No Prior Authorization is required at this time based on the alternative drug you chose to prescribe.;CaseId:[id number];Status:Cancelled;

## 2016-06-12 NOTE — Telephone Encounter (Signed)
Prior Authorization was completed for Fioricet as requested. She is prescribed the generic and the generic does not require a prior authorization.  Response is: No Prior Authorization is required at this time based on the alternative drug you chose to prescribe.;CaseId:[id number];Status:Cancelled;

## 2016-06-13 NOTE — Telephone Encounter (Signed)
Prior Authorization pending at this time.

## 2016-06-19 ENCOUNTER — Telehealth: Payer: Self-pay

## 2016-06-19 MED ORDER — BUTALBITAL-APAP-CAFFEINE 50-325-40 MG PO TABS: 1.0000 | ORAL_TABLET | Freq: Two times a day (BID) | ORAL | 0 refills | Status: DC | PRN

## 2016-06-19 NOTE — Telephone Encounter (Signed)
Spoke to husband (confirmed on DPR) and he advised to send rx to [facility] for 60 day. Rx called in.

## 2016-06-19 NOTE — Addendum Note (Signed)
Addended by: Precious Gilding on: 06/19/2016 03:02 PM   Modules accepted: Orders

## 2016-06-19 NOTE — Telephone Encounter (Signed)
Prior Authorization approval  for butalbital-acetaminophen-caffeine (FIORICET, ESGIC) 50-325-40 MG tablet  Approvedtoday  [id number] Name:ST: Allzital tablet, Bupap tablet, Esgic capsule/tablet, Fioricet capsule, Fioricet w/codeine capsule, Vanatol LQ solution, Fiorinal capsule, Fiorinal w/codeine#3 capsule - Expendable List - ESI;Status:Approved;Coverage Start Date:05/13/2016;Coverage End Date:06/19/2017;

## 2016-06-20 MED ORDER — FIORICET 50-325-40 MG PO TABS: 1.0000 | ORAL_TABLET | Freq: Two times a day (BID) | ORAL | 0 refills | Status: DC

## 2016-06-20 NOTE — Telephone Encounter (Signed)
Rx printed for signature to be faxed. DAW on the Rx.

## 2016-06-20 NOTE — Telephone Encounter (Signed)
Rx faxed

## 2016-06-20 NOTE — Telephone Encounter (Addendum)
Pt states we did not send in the brand name, even though it has been approved.  Can you resend? Not the: butalbital-acetaminophen-caffeine (FIORICET, ESGIC) 50-325-40 MG tablet  FIORICET 50-300-40 MG  [facility] [address]

## 2016-06-21 ENCOUNTER — Telehealth: Payer: Self-pay | Admitting: Family Medicine

## 2016-06-21 NOTE — Telephone Encounter (Signed)
Pt calling to let us know that the strength that is on the PA is incorrect per [facility] it should be 300 mg for brand name instead of 325 mg for the Rx [facility]) that she has been trying to get since 8/14.   Pt stated that she is so grateful for everyone help with this.

## 2016-06-21 NOTE — Telephone Encounter (Signed)
Pt requesting brand name only for Fioricet, pharmacy sent over a fax for 50-300-40 mg, this is only dosage that name brand is dispensed in. Per Dr. Sarajane Jews okay to call in a new script for above dosage and take twice a day, send to [facility].

## 2016-06-22 MED ORDER — BUTALBITAL-APAP-CAFFEINE 50-300-40 MG PO CAPS: ORAL_CAPSULE | ORAL | 1 refills | Status: DC

## 2016-06-22 NOTE — Telephone Encounter (Signed)
rx was calling [facility] by sylvia

## 2016-06-22 NOTE — Telephone Encounter (Signed)
I called in script to [facility] and they will notify pt when ready for pick up. Also they will cancel the generic form and I removed from pt's current list.

## 2016-07-27 ENCOUNTER — Other Ambulatory Visit: Payer: 59

## 2016-07-27 ENCOUNTER — Other Ambulatory Visit: Payer: Self-pay | Admitting: Family Medicine

## 2016-08-02 ENCOUNTER — Other Ambulatory Visit (INDEPENDENT_AMBULATORY_CARE_PROVIDER_SITE_OTHER): Payer: 59

## 2016-08-02 DIAGNOSIS — Z0000 Encounter for general adult medical examination without abnormal findings: Secondary | ICD-10-CM | POA: Diagnosis not present

## 2016-08-02 LAB — POC URINALSYSI DIPSTICK (AUTOMATED)
Bilirubin, UA: NEGATIVE
Glucose, UA: NEGATIVE
Ketones, UA: NEGATIVE
Nitrite, UA: POSITIVE
Spec Grav, UA: 1.02
Urobilinogen, UA: 0.2
pH, UA: 5.5

## 2016-08-02 LAB — LIPID PANEL
Cholesterol: 191 mg/dL (ref 0–200)
HDL: 46.6 mg/dL (ref >39.00)
LDL Cholesterol: 105 mg/dL — ABNORMAL HIGH (ref 0–99)
NonHDL: 144.24
Total CHOL/HDL Ratio: 4
Triglycerides: 197 mg/dL — ABNORMAL HIGH (ref 0.0–149.0)
VLDL: 39.4 mg/dL (ref 0.0–40.0)

## 2016-08-02 LAB — BASIC METABOLIC PANEL
BUN: 23 mg/dL (ref 6–23)
CO2: 32 mEq/L (ref 19–32)
Calcium: 9.2 mg/dL (ref 8.4–10.5)
Chloride: 101 mEq/L (ref 96–112)
Creatinine, Ser: 0.84 mg/dL (ref 0.40–1.20)
GFR: 72.5 mL/min (ref >60.00)
Glucose, Bld: 93 mg/dL (ref 70–99)
Potassium: 3.8 mEq/L (ref 3.5–5.1)
Sodium: 142 mEq/L (ref 135–145)

## 2016-08-02 LAB — CBC WITH DIFFERENTIAL/PLATELET
Basophils Absolute: 0 10*3/uL (ref 0.0–0.1)
Basophils Relative: 0.5 % (ref 0.0–3.0)
Eosinophils Absolute: 0.1 10*3/uL (ref 0.0–0.7)
Eosinophils Relative: 0.9 % (ref 0.0–5.0)
HCT: 37.7 % (ref 36.0–46.0)
Hemoglobin: 12.2 g/dL (ref 12.0–15.0)
Lymphocytes Relative: 35.2 % (ref 12.0–46.0)
Lymphs Abs: 2.5 10*3/uL (ref 0.7–4.0)
MCHC: 32.3 g/dL (ref 30.0–36.0)
MCV: 75.4 fl — ABNORMAL LOW (ref 78.0–100.0)
Monocytes Absolute: 1 10*3/uL (ref 0.1–1.0)
Monocytes Relative: 13.5 % — ABNORMAL HIGH (ref 3.0–12.0)
Neutro Abs: 3.5 10*3/uL (ref 1.4–7.7)
Neutrophils Relative %: 49.9 % (ref 43.0–77.0)
Platelets: 295 10*3/uL (ref 150.0–400.0)
RBC: 5.01 Mil/uL (ref 3.87–5.11)
RDW: 16.9 % — ABNORMAL HIGH (ref 11.5–15.5)
WBC: 7.1 10*3/uL (ref 4.0–10.5)

## 2016-08-02 LAB — HEPATIC FUNCTION PANEL
ALT: 19 U/L (ref 0–35)
AST: 17 U/L (ref 0–37)
Albumin: 3.8 g/dL (ref 3.5–5.2)
Alkaline Phosphatase: 53 U/L (ref 39–117)
Bilirubin, Direct: 0 mg/dL (ref 0.0–0.3)
Total Bilirubin: 0.3 mg/dL (ref 0.2–1.2)
Total Protein: 6.9 g/dL (ref 6.0–8.3)

## 2016-08-02 LAB — TSH: TSH: 2.87 u[IU]/mL (ref 0.35–4.50)

## 2016-08-03 ENCOUNTER — Encounter: Payer: 59 | Admitting: Family Medicine

## 2016-08-07 ENCOUNTER — Ambulatory Visit: Payer: Self-pay | Admitting: Orthopedic Surgery

## 2016-08-09 ENCOUNTER — Encounter: Payer: Self-pay | Admitting: Family Medicine

## 2016-08-09 ENCOUNTER — Ambulatory Visit (INDEPENDENT_AMBULATORY_CARE_PROVIDER_SITE_OTHER): Payer: 59 | Admitting: Family Medicine

## 2016-08-09 VITALS — BP 113/70 | HR 77 | Temp 99.1°F | Ht 67.0 in | Wt 205.0 lb

## 2016-08-09 DIAGNOSIS — Z0000 Encounter for general adult medical examination without abnormal findings: Secondary | ICD-10-CM

## 2016-08-09 MED ORDER — BUTALBITAL-APAP-CAFFEINE 50-325-40 MG PO TABS: 1.0000 | ORAL_TABLET | Freq: Two times a day (BID) | ORAL | 1 refills | Status: DC

## 2016-08-09 MED ORDER — HYDROCHLOROTHIAZIDE 25 MG PO TABS: 25.0000 mg | ORAL_TABLET | Freq: Every day | ORAL | 3 refills | Status: DC

## 2016-08-09 MED ORDER — HYOSCYAMINE SULFATE 0.125 MG SL SUBL: SUBLINGUAL_TABLET | SUBLINGUAL | 5 refills | Status: DC

## 2016-08-09 MED ORDER — CLINDAMYCIN HCL 300 MG PO CAPS: 300.0000 mg | ORAL_CAPSULE | Freq: Three times a day (TID) | ORAL | 0 refills | Status: DC

## 2016-08-09 MED ORDER — PREDNISONE 10 MG PO TABS: 5.0000 mg | ORAL_TABLET | Freq: Every day | ORAL | 5 refills | Status: DC

## 2016-08-09 MED ORDER — EZETIMIBE 10 MG PO TABS: 10.0000 mg | ORAL_TABLET | Freq: Every day | ORAL | 3 refills | Status: DC

## 2016-08-09 MED ORDER — OMEPRAZOLE 20 MG PO CPDR: 20.0000 mg | DELAYED_RELEASE_CAPSULE | Freq: Every day | ORAL | 3 refills | Status: DC

## 2016-08-09 MED ORDER — DIAZEPAM 5 MG PO TABS: 5.0000 mg | ORAL_TABLET | Freq: Four times a day (QID) | ORAL | 1 refills | Status: DC | PRN

## 2016-08-09 NOTE — Progress Notes (Signed)
   Subjective:    Patient ID: Victoria Mosley, female    DOB: Jul 25, 1951, 65 y.o.   MRN: [id number]  HPI 65 yr old female for a well exam. She is scheduled for a right total hip replacement on 09-27-16. She is taking Aleve and Prednisone now for pain control. She also thinks she has a sinus infection because there is pressure over the left maxillary sinus and she has PND and is blowing green or black mucus from  The nose. No fever.    Review of Systems  Constitutional: Negative.   HENT: Positive for postnasal drip and sinus pressure. Negative for congestion, dental problem, ear discharge, ear pain, facial swelling, nosebleeds, sore throat, trouble swallowing and voice change.   Eyes: Negative.   Respiratory: Negative.   Cardiovascular: Negative.   Gastrointestinal: Negative.   Genitourinary: Negative for decreased urine volume, difficulty urinating, dyspareunia, dysuria, enuresis, flank pain, frequency, hematuria, pelvic pain and urgency.  Musculoskeletal: Positive for arthralgias and gait problem. Negative for back pain, joint swelling, myalgias, neck pain and neck stiffness.  Skin: Negative.   Psychiatric/Behavioral: Negative.        Objective:   Physical Exam  Constitutional: She is oriented to person, place, and time. She appears well-developed and well-nourished. No distress.  HENT:  Head: Normocephalic and atraumatic.  Right Ear: External ear normal.  Left Ear: External ear normal.  Nose: Nose normal.  Mouth/Throat: Oropharynx is clear and moist. No oropharyngeal exudate.  Eyes: Conjunctivae and EOM are normal. Pupils are equal, round, and reactive to light. No scleral icterus.  Neck: Normal range of motion. Neck supple. No JVD present. No thyromegaly present.  Cardiovascular: Normal rate, regular rhythm, normal heart sounds and intact distal pulses.  Exam reveals no gallop and no friction rub.   No murmur heard. EKG is within normal limits   Pulmonary/Chest: Effort normal  and breath sounds normal. No respiratory distress. She has no wheezes. She has no rales. She exhibits no tenderness.  Abdominal: Soft. Bowel sounds are normal. She exhibits no distension and no mass. There is no tenderness. There is no rebound and no guarding.  Musculoskeletal: Normal range of motion. She exhibits no edema or tenderness.  Lymphadenopathy:    She has no cervical adenopathy.  Neurological: She is alert and oriented to person, place, and time. She has normal reflexes. No cranial nerve deficit. She exhibits normal muscle tone. Coordination normal.  Skin: Skin is warm and dry. No rash noted. No erythema.  Psychiatric: She has a normal mood and affect. Her behavior is normal. Judgment and thought content normal.          Assessment & Plan:  Well exam. We discussed diet and exercise. Treat the sinusitis with Clindamycin. She is cleared for her hip surgery, but I did advised her to stop taking the Prednisone and all NSAIDs 14 days prior to the surgery.  Laurey Morale, MD

## 2016-08-09 NOTE — Progress Notes (Signed)
Pre visit review using our clinic review tool, if applicable. No additional management support is needed unless otherwise documented below in the visit note. 

## 2016-08-10 ENCOUNTER — Ambulatory Visit: Payer: Self-pay | Admitting: Orthopedic Surgery

## 2016-08-15 ENCOUNTER — Telehealth: Payer: Self-pay | Admitting: Family Medicine

## 2016-08-15 MED ORDER — BUTALBITAL-APAP-CAFFEINE 50-300-40 MG PO CAPS: 1.0000 | ORAL_CAPSULE | Freq: Two times a day (BID) | ORAL | 1 refills | Status: DC | PRN

## 2016-08-15 NOTE — Telephone Encounter (Signed)
Brand name Fioricet 50/325 mg has been discontinued. Possible alternative is Fioricet caps 50/300 mg and send to [facility]. I spoke with pt and she is okay with new dose change.

## 2016-08-15 NOTE — Telephone Encounter (Signed)
done

## 2016-08-16 NOTE — Telephone Encounter (Signed)
Script was faxed to [facility].  

## 2016-08-31 ENCOUNTER — Other Ambulatory Visit: Payer: Self-pay | Admitting: Family Medicine

## 2016-09-06 ENCOUNTER — Encounter: Payer: Self-pay | Admitting: Family Medicine

## 2016-09-06 ENCOUNTER — Ambulatory Visit (INDEPENDENT_AMBULATORY_CARE_PROVIDER_SITE_OTHER): Payer: 59 | Admitting: Family Medicine

## 2016-09-06 VITALS — BP 117/76 | HR 80 | Temp 98.5°F | Ht 67.0 in | Wt 208.0 lb

## 2016-09-06 DIAGNOSIS — J0100 Acute maxillary sinusitis, unspecified: Secondary | ICD-10-CM

## 2016-09-06 MED ORDER — AMOXICILLIN-POT CLAVULANATE 875-125 MG PO TABS: 1.0000 | ORAL_TABLET | Freq: Two times a day (BID) | ORAL | 0 refills | Status: DC

## 2016-09-06 NOTE — Progress Notes (Signed)
Pre visit review using our clinic review tool, if applicable. No additional management support is needed unless otherwise documented below in the visit note. 

## 2016-09-06 NOTE — Progress Notes (Signed)
   Subjective:    Patient ID: Victoria Mosley, female    DOB: 1951/01/27, 65 y.o.   MRN: [id number]  HPI Here for 2 days of facial pain, sinus pressure, and blowing green mucus from the nose. No fever or cough.    Review of Systems  Constitutional: Negative.   HENT: Positive for congestion, postnasal drip, sinus pain and sinus pressure. Negative for sore throat.   Eyes: Negative.   Respiratory: Negative.        Objective:   Physical Exam  Constitutional: She appears well-developed and well-nourished.  HENT:  Right Ear: External ear normal.  Left Ear: External ear normal.  Nose: Nose normal.  Mouth/Throat: Oropharynx is clear and moist.  Eyes: Conjunctivae are normal.  Neck: No thyromegaly present.  Pulmonary/Chest: Effort normal and breath sounds normal. No respiratory distress. She has no wheezes. She has no rales.  Lymphadenopathy:    She has no cervical adenopathy.          Assessment & Plan:  Sinusitis, treat with Augmentin. Laurey Morale, MD

## 2016-09-12 ENCOUNTER — Telehealth: Payer: Self-pay | Admitting: Family Medicine

## 2016-09-12 NOTE — Telephone Encounter (Signed)
Pt was calling to let you know that Dr. Anne Fu office has not received the surgical clearance and will be faxing a 2nd copy to be signed and faxed for her 09/27/16 surgery.

## 2016-09-13 NOTE — Telephone Encounter (Signed)
We will be waiting on form.

## 2016-09-15 ENCOUNTER — Other Ambulatory Visit (HOSPITAL_COMMUNITY): Payer: Self-pay | Admitting: *Deleted

## 2016-09-15 NOTE — Patient Instructions (Addendum)
Victoria Mosley  09/15/2016   Your procedure is scheduled on: 09-27-16  Report to [facility]  Entrance take [facility]  elevators to 3rd floor to  [place] at 530  AM.  Call this number if you have problems the morning of surgery [phone]   Remember: ONLY 1 PERSON MAY GO WITH YOU TO SHORT STAY TO GET  READY MORNING OF [place].  Do not eat food or drink liquids :After Midnight.     Take these medicines the morning of surgery with A SIP OF WATER: DIAZEPAM (VALIUM ) IF NEEDED              You may not have any metal on your body including hair pins and              piercings  Do not wear jewelry, make-up, lotions, powders or perfumes, deodorant             Do not wear nail polish.  Do not shave  48 hours prior to surgery.              Men may shave face and neck.   Do not bring valuables to the hospital. [place].  Contacts, dentures or bridgework may not be worn into surgery.  Leave suitcase in the car. After surgery it may be brought to your room.                  Please read over the following fact sheets you were given: _____________________________________________________________________             [facility] - Preparing for Surgery Before surgery, you can play an important role.  Because skin is not sterile, your skin needs to be as free of germs as possible.  You can reduce the number of germs on your skin by washing with CHG (chlorahexidine gluconate) soap before surgery.  CHG is an antiseptic cleaner which kills germs and bonds with the skin to continue killing germs even after washing. Please DO NOT use if you have an allergy to CHG or antibacterial soaps.  If your skin becomes reddened/irritated stop using the CHG and inform your nurse when you arrive at Short Stay. Do not shave (including legs and underarms) for at least 48 hours prior to the first CHG shower.  You may shave  your face/neck. Please follow these instructions carefully:  1.  Shower with CHG Soap the night before surgery and the  morning of Surgery.  2.  If you choose to wash your hair, wash your hair first as usual with your  normal  shampoo.  3.  After you shampoo, rinse your hair and body thoroughly to remove the  shampoo.                           4.  Use CHG as you would any other liquid soap.  You can apply chg directly  to the skin and wash                       Gently with a scrungie or clean washcloth.  5.  Apply the CHG Soap to your body ONLY FROM THE NECK DOWN.   Do not use  on face/ open                           Wound or open sores. Avoid contact with eyes, ears mouth and genitals (private parts).                       Wash face,  Genitals (private parts) with your normal soap.             6.  Wash thoroughly, paying special attention to the area where your surgery  will be performed.  7.  Thoroughly rinse your body with warm water from the neck down.  8.  DO NOT shower/wash with your normal soap after using and rinsing off  the CHG Soap.                9.  Pat yourself dry with a clean towel.            10.  Wear clean pajamas.            11.  Place clean sheets on your bed the night of your first shower and do not  sleep with pets. Day of Surgery : Do not apply any lotions/deodorants the morning of surgery.  Please wear clean clothes to the hospital/surgery center.  FAILURE TO FOLLOW THESE INSTRUCTIONS MAY RESULT IN THE CANCELLATION OF YOUR SURGERY PATIENT SIGNATURE_________________________________  NURSE SIGNATURE__________________________________  ________________________________________________________________________   Adam Phenix  An incentive spirometer is a tool that can help keep your lungs clear and active. This tool measures how well you are filling your lungs with each breath. Taking long deep breaths may help reverse or decrease the chance of developing  breathing (pulmonary) problems (especially infection) following:  A long period of time when you are unable to move or be active. BEFORE THE PROCEDURE   If the spirometer includes an indicator to show your best effort, your nurse or respiratory therapist will set it to a desired goal.  If possible, sit up straight or lean slightly forward. Try not to slouch.  Hold the incentive spirometer in an upright position. INSTRUCTIONS FOR USE  1. Sit on the edge of your bed if possible, or sit up as far as you can in bed or on a chair. 2. Hold the incentive spirometer in an upright position. 3. Breathe out normally. 4. Place the mouthpiece in your mouth and seal your lips tightly around it. 5. Breathe in slowly and as deeply as possible, raising the piston or the ball toward the top of the column. 6. Hold your breath for 3-5 seconds or for as long as possible. Allow the piston or ball to fall to the bottom of the column. 7. Remove the mouthpiece from your mouth and breathe out normally. 8. Rest for a few seconds and repeat Steps 1 through 7 at least 10 times every 1-2 hours when you are awake. Take your time and take a few normal breaths between deep breaths. 9. The spirometer may include an indicator to show your best effort. Use the indicator as a goal to work toward during each repetition. 10. After each set of 10 deep breaths, practice coughing to be sure your lungs are clear. If you have an incision (the cut made at the time of surgery), support your incision when coughing by placing a pillow or rolled up towels firmly against it. Once you are able to get out of bed, walk around  indoors and cough well. You may stop using the incentive spirometer when instructed by your caregiver.  RISKS AND COMPLICATIONS  Take your time so you do not get dizzy or light-headed.  If you are in pain, you may need to take or ask for pain medication before doing incentive spirometry. It is harder to take a deep  breath if you are having pain. AFTER USE  Rest and breathe slowly and easily.  It can be helpful to keep track of a log of your progress. Your caregiver can provide you with a simple table to help with this. If you are using the spirometer at home, follow these instructions: [place] IF:   You are having difficultly using the spirometer.  You have trouble using the spirometer as often as instructed.  Your pain medication is not giving enough relief while using the spirometer.  You develop fever of 100.5 F (38.1 C) or higher. SEEK IMMEDIATE MEDICAL CARE IF:   You cough up bloody sputum that had not been present before.  You develop fever of 102 F (38.9 C) or greater.  You develop worsening pain at or near the incision site. MAKE SURE YOU:   Understand these instructions.  Will watch your condition.  Will get help right away if you are not doing well or get worse. Document Released: 02/26/2007 Document Revised: 01/08/2012 Document Reviewed: 04/29/2007 ExitCare Patient Information 2014 ExitCare, [place].   ________________________________________________________________________  WHAT IS A BLOOD TRANSFUSION? Blood Transfusion Information  A transfusion is the replacement of blood or some of its parts. Blood is made up of multiple cells which provide different functions.  Red blood cells carry oxygen and are used for blood loss replacement.  White blood cells fight against infection.  Platelets control bleeding.  Plasma helps clot blood.  Other blood products are available for specialized needs, such as hemophilia or other clotting disorders. BEFORE THE TRANSFUSION  Who gives blood for transfusions?   Healthy volunteers who are fully evaluated to make sure their blood is safe. This is blood bank blood. Transfusion therapy is the safest it has ever been in the practice of medicine. Before blood is taken from a donor, a complete history is taken to make sure  that person has no history of diseases nor engages in risky social behavior (examples are intravenous drug use or sexual activity with multiple partners). The donor's travel history is screened to minimize risk of transmitting infections, such as malaria. The donated blood is tested for signs of infectious diseases, such as HIV and hepatitis. The blood is then tested to be sure it is compatible with you in order to minimize the chance of a transfusion reaction. If you or a relative donates blood, this is often done in anticipation of surgery and is not appropriate for emergency situations. It takes many days to process the donated blood. RISKS AND COMPLICATIONS Although transfusion therapy is very safe and saves many lives, the main dangers of transfusion include:   Getting an infectious disease.  Developing a transfusion reaction. This is an allergic reaction to something in the blood you were given. Every precaution is taken to prevent this. The decision to have a blood transfusion has been considered carefully by your caregiver before blood is given. Blood is not given unless the benefits outweigh the risks. AFTER THE TRANSFUSION  Right after receiving a blood transfusion, you will usually feel much better and more energetic. This is especially true if your red blood cells have gotten  low (anemic). The transfusion raises the level of the red blood cells which carry oxygen, and this usually causes an energy increase.  The nurse administering the transfusion will monitor you carefully for complications. HOME CARE INSTRUCTIONS  No special instructions are needed after a transfusion. You may find your energy is better. Speak with your caregiver about any limitations on activity for underlying diseases you may have. SEEK MEDICAL CARE IF:   Your condition is not improving after your transfusion.  You develop redness or irritation at the intravenous (IV) site. SEEK IMMEDIATE MEDICAL CARE IF:  Any of  the following symptoms occur over the next 12 hours:  Shaking chills.  You have a temperature by mouth above 102 F (38.9 C), not controlled by medicine.  Chest, back, or muscle pain.  People around you feel you are not acting correctly or are confused.  Shortness of breath or difficulty breathing.  Dizziness and fainting.  You get a rash or develop hives.  You have a decrease in urine output.  Your urine turns a dark color or changes to pink, red, or brown. Any of the following symptoms occur over the next 10 days:  You have a temperature by mouth above 102 F (38.9 C), not controlled by medicine.  Shortness of breath.  Weakness after normal activity.  The white part of the eye turns yellow (jaundice).  You have a decrease in the amount of urine or are urinating less often.  Your urine turns a dark color or changes to pink, red, or brown. Document Released: 10/13/2000 Document Revised: 01/08/2012 Document Reviewed: 06/01/2008 [facility] Patient Information 2014 [place], [place].  _______________________________________________________________________

## 2016-09-15 NOTE — Progress Notes (Signed)
Is it right or left hip orders from 08-07-16 say left hip, oprders from 08-10-16 say right hip, please claiify which hip

## 2016-09-15 NOTE — Progress Notes (Signed)
EKG 08-09-16 EPIC

## 2016-09-18 ENCOUNTER — Encounter (HOSPITAL_COMMUNITY)
Admission: RE | Admit: 2016-09-18 | Discharge: 2016-09-18 | Disposition: A | Discharge status: Home / Self Care | Payer: 59 | Source: Ambulatory Visit | Attending: Orthopedic Surgery | Admitting: Orthopedic Surgery

## 2016-09-18 ENCOUNTER — Encounter (HOSPITAL_COMMUNITY): Payer: Self-pay

## 2016-09-18 DIAGNOSIS — M25562 Pain in left knee: Secondary | ICD-10-CM | POA: Diagnosis not present

## 2016-09-18 DIAGNOSIS — K219 Gastro-esophageal reflux disease without esophagitis: Secondary | ICD-10-CM | POA: Insufficient documentation

## 2016-09-18 DIAGNOSIS — N319 Neuromuscular dysfunction of bladder, unspecified: Secondary | ICD-10-CM | POA: Diagnosis not present

## 2016-09-18 DIAGNOSIS — G43009 Migraine without aura, not intractable, without status migrainosus: Secondary | ICD-10-CM | POA: Insufficient documentation

## 2016-09-18 DIAGNOSIS — K589 Irritable bowel syndrome without diarrhea: Secondary | ICD-10-CM | POA: Insufficient documentation

## 2016-09-18 DIAGNOSIS — K5732 Diverticulitis of large intestine without perforation or abscess without bleeding: Secondary | ICD-10-CM | POA: Diagnosis not present

## 2016-09-18 DIAGNOSIS — M1990 Unspecified osteoarthritis, unspecified site: Secondary | ICD-10-CM | POA: Diagnosis not present

## 2016-09-18 DIAGNOSIS — E785 Hyperlipidemia, unspecified: Secondary | ICD-10-CM | POA: Insufficient documentation

## 2016-09-18 DIAGNOSIS — Z87440 Personal history of urinary (tract) infections: Secondary | ICD-10-CM | POA: Insufficient documentation

## 2016-09-18 DIAGNOSIS — Z01812 Encounter for preprocedural laboratory examination: Secondary | ICD-10-CM | POA: Diagnosis present

## 2016-09-18 DIAGNOSIS — M25561 Pain in right knee: Secondary | ICD-10-CM | POA: Insufficient documentation

## 2016-09-18 DIAGNOSIS — J309 Allergic rhinitis, unspecified: Secondary | ICD-10-CM | POA: Insufficient documentation

## 2016-09-18 HISTORY — DX: Adverse effect of unspecified anesthetic, initial encounter: T41.45XA

## 2016-09-18 HISTORY — DX: Other complications of anesthesia, initial encounter: T88.59XA

## 2016-09-18 HISTORY — DX: Other specified postprocedural states: Z98.890

## 2016-09-18 HISTORY — DX: Personal history of urinary calculi: Z87.442

## 2016-09-18 HISTORY — DX: Other specified postprocedural states: R11.2

## 2016-09-18 LAB — URINALYSIS, ROUTINE W REFLEX MICROSCOPIC
Bilirubin Urine: NEGATIVE
Glucose, UA: NEGATIVE mg/dL
Hgb urine dipstick: NEGATIVE
Ketones, ur: NEGATIVE mg/dL
Nitrite: POSITIVE — AB
Protein, ur: NEGATIVE mg/dL
Specific Gravity, Urine: 1.02 (ref 1.005–1.030)
pH: 6.5 (ref 5.0–8.0)

## 2016-09-18 LAB — COMPREHENSIVE METABOLIC PANEL
ALT: 31 U/L (ref 14–54)
AST: 33 U/L (ref 15–41)
Albumin: 4.5 g/dL (ref 3.5–5.0)
Alkaline Phosphatase: 72 U/L (ref 38–126)
Anion gap: 10 (ref 5–15)
BUN: 28 mg/dL — ABNORMAL HIGH (ref 6–20)
CO2: 27 mmol/L (ref 22–32)
Calcium: 9.4 mg/dL (ref 8.9–10.3)
Chloride: 104 mmol/L (ref 101–111)
Creatinine, Ser: 0.83 mg/dL (ref 0.44–1.00)
GFR calc Af Amer: >60 mL/min (ref >60)
GFR calc non Af Amer: >60 mL/min (ref >60)
Glucose, Bld: 207 mg/dL — ABNORMAL HIGH (ref 65–99)
Potassium: 4 mmol/L (ref 3.5–5.1)
Sodium: 141 mmol/L (ref 135–145)
Total Bilirubin: 0.7 mg/dL (ref 0.3–1.2)
Total Protein: 7.4 g/dL (ref 6.5–8.1)

## 2016-09-18 LAB — URINE MICROSCOPIC-ADD ON
RBC / HPF: NONE SEEN RBC/hpf (ref 0–5)
Squamous Epithelial / LPF: NONE SEEN

## 2016-09-18 LAB — CBC
HCT: 37.3 % (ref 36.0–46.0)
Hemoglobin: 11.4 g/dL — ABNORMAL LOW (ref 12.0–15.0)
MCH: 23 pg — ABNORMAL LOW (ref 26.0–34.0)
MCHC: 30.6 g/dL (ref 30.0–36.0)
MCV: 75.4 fL — ABNORMAL LOW (ref 78.0–100.0)
Platelets: 333 10*3/uL (ref 150–400)
RBC: 4.95 MIL/uL (ref 3.87–5.11)
RDW: 16.2 % — ABNORMAL HIGH (ref 11.5–15.5)
WBC: 8.3 10*3/uL (ref 4.0–10.5)

## 2016-09-18 LAB — APTT: aPTT: 40 seconds — ABNORMAL HIGH (ref 24–36)

## 2016-09-18 LAB — PROTIME-INR
INR: 1.04
Prothrombin Time: 13.6 seconds (ref 11.4–15.2)

## 2016-09-18 NOTE — Progress Notes (Signed)
cmet results faxed to dr aluisio by epic 

## 2016-09-20 LAB — SURGICAL PCR SCREEN
MRSA, PCR: POSITIVE — AB
Staphylococcus aureus: POSITIVE — AB

## 2016-09-26 ENCOUNTER — Ambulatory Visit: Payer: Self-pay | Admitting: Orthopedic Surgery

## 2016-09-26 NOTE — H&P (Signed)
Victoria Mosley DOB: Nov 27, 1950 Married / Language: English / Race: White Female Date of Admission:  09/27/2016 CC:  Left Hip Pain History of Present Illness  The patient is a 65 year old female who comes in  for a preoperative History and Physical. The patient is scheduled for a left total hip arthroplasty (anterior) to be performed by Dr. Dione Plover. Aluisio, MD at [facility] on 09-27-2016. The patient is a 65 year old female who presented for follow up of their hip. The patient is being followed for their left hip pain and osteoarthritis. They are months out from I-A injection. Symptoms reported include: pain. The following medication has been used for pain control: Aleve. Note for "Follow-up Hip": Patient states that the injection did not help at all. She does not want another injection. Her left hip is getting progressively worse. The intraarticular injection did not provide any benefit. The hip is hurting at all times. It is limiting what she can and cannot do. She has reached a point where she would lile to get the hip fixed at this time. They have been treated conservatively in the past for the above stated problem and despite conservative measures, they continue to have progressive pain and severe functional limitations and dysfunction. They have failed non-operative management including home exercise, medications, and injections. It is felt that they would benefit from undergoing total joint replacement. Risks and benefits of the procedure have been discussed with the patient and they elect to proceed with surgery. There are no active contraindications to surgery such as ongoing infection or rapidly progressive neurological disease.  Problem List/Past Medical  Acute pain of left shoulder (M25.512)  Radiculitis of left cervical region (M54.12)  Primary osteoarthritis of left hip (M16.12)  Pain of left hip joint (M25.552)  Status post total right knee replacement [id number])   Degenerative Disc Disease  Chronic Cystitis  Kidney Stone  Skin Cancer  Urinary Incontinence  Diverticulitis Of Colon  Irritable bowel syndrome  Hemorrhoids  Gastroesophageal Reflux Disease  Hypercholesterolemia  Pneumonia  Bronchitis  Tinnitus  Impaired Vision  Migraine Headache  Anxiety Disorder  Depression  Chronic Pain  Osteoarthritis  Primary osteoarthritis of right knee (M17.11)  Measles  Mumps  Menopause  Allergies  Levaquin *FLUOROQUINOLONES*  Biaxin *MACROLIDES*  stomach upset Pravastatin Sodium *ANTIHYPERLIPIDEMICS*  joint pain  Family History  Osteoarthritis  mother, father and sister Hypertension  mother, father and sister Heart disease in female family member before age 32  Severe allergy  mother Cancer  father, sister and grandmother fathers side Heart Disease  mother, sister, grandfather mothers side and grandfather fathers side Depression  mother  Social History Drug/Alcohol Rehab (Previously)  no Drug/Alcohol Rehab (Currently)  no Current work status  working full time Living situation  live with spouse Illicit drug use  no Exercise  Exercises weekly; does running / walking Children  1 Alcohol use  never consumed alcohol Pain Contract  no Number of flights of stairs before winded  2-3 Marital status  married Tobacco use  Former smoker. former smoker; smoke(d) less than 1/2 pack(s) per day Tobacco / smoke exposure  no Advance Directives  Living Will, Healthcare POA  Medication History Calcium Citrate (Oral) Specific strength unknown - Active. Potassium Gluconate (Oral) Specific strength unknown - Active. Fish Oil Active. Flaxseed Oil Active. Vitamin B Complex (Oral) Active. Vitamin C ER (1000MG  Tablet ER, Oral) Active. Womens 50+ Advanced (Oral) Active. Sudafed 12 Hour (120MG  Tablet ER 12HR, Oral as needed) Active.  Dramamine (50MG  Tablet, Oral as needed) Active. Excedrin Migraine  (250-250-65MG  Tablet, Oral as needed) Active. MiraLax (Oral) Active. Benadryl Allergy (25MG  Capsule, Oral) Active. Aleve (220MG  Tablet, Oral) Active. Probiotic (Oral) Specific strength unknown - Active. Cranberry (500MG  Capsule, Oral) Active. Loratadine (10MG  Tablet, Oral) Active. Benzoyl Peroxide (5% Gel, External as needed) Active. Beta-Val (0.1% Cream, External) Active. Valium (5MG  Tablet, Oral) Active. (as needed) PredniSONE (10MG  Tablet, Oral) Active. Hyoscyamine Sulfate (0.125MG  Tab Sublingual, Sublingual) Active. Fioricet (50-325-40MG  Tablet, Oral as needed) Active. Propranolol HCl (40MG  Tablet, Oral) Active. Omeprazole (20MG  Tablet DR, Oral) Active. Zetia (10MG  Tablet, Oral) Active. Ogen (0.625MG  Tablet, Oral) Active. Provera (2.5MG  Tablet, Oral) Active. Hydrochlorothiazide (25MG  Tablet, Oral) Active.  Past Surgical History Tubal Ligation  Left maxillary sinus surgery [03/2016]: Total Hip Replacement  right Colon Polyp Removal - Colonoscopy  Cesarean Delivery  1 time Foot Surgery  bilateral Straighten Nasal Septum  Spinal Surgery     Review of Systems General Present- Fatigue. Not Present- Chills, Fever, Memory Loss, Night Sweats, Weight Gain and Weight Loss. Skin Not Present- Eczema, Hives, Itching, Lesions and Rash. HEENT Present- Blurred Vision, Headache and Tinnitus. Not Present- Dentures, Double Vision, Hearing Loss and Visual Loss. Respiratory Present- Cough. Not Present- Allergies, Chronic Cough, Coughing up blood, Shortness of breath at rest and Shortness of breath with exertion. Cardiovascular Not Present- Chest Pain, Difficulty Breathing Lying Down, Murmur, Palpitations, Racing/skipping heartbeats and Swelling. Gastrointestinal Present- Bloody Stool (hemorrhoids) and Constipation. Not Present- Abdominal Pain, Diarrhea, Difficulty Swallowing, Heartburn, Jaundice, Loss of appetitie, Nausea and Vomiting. Female Genitourinary Present-  Incontinence, Urinary frequency, Urinating at Night and Weak urinary stream. Not Present- Blood in Urine, Discharge, Flank Pain, Painful Urination, Urgency and Urinary Retention. Musculoskeletal Present- Back Pain, Joint Pain, Joint Swelling, Morning Stiffness, Muscle Pain, Muscle Weakness and Spasms. Neurological Not Present- Blackout spells, Difficulty with balance, Dizziness, Paralysis, Tremor and Weakness. Psychiatric Present- Insomnia.  Vitals  Weight: 203 lb Height: 67in Weight was reported by patient. Height was reported by patient. Body Surface Area: 2.03 m Body Mass Index: 31.79 kg/m  Pulse: 76 (Regular)  BP: 138/80 (Sitting, Left Arm, Standard)  Physical Exam  General Mental Status -Alert, cooperative and good historian. General Appearance-pleasant, Not in acute distress. Orientation-Oriented X3. Build & Nutrition-Well nourished and Well developed.  Head and Neck Head-normocephalic, atraumatic . Neck Global Assessment - supple, no bruit auscultated on the right, no bruit auscultated on the left.  Eye Pupil - Bilateral-Regular and Round. Motion - Bilateral-EOMI.  Chest and Lung Exam Auscultation Breath sounds - clear at anterior chest wall and clear at posterior chest wall. Adventitious sounds - No Adventitious sounds.  Cardiovascular Auscultation Rhythm - Regular rate and rhythm. Heart Sounds - S1 WNL and S2 WNL. Murmurs & Other Heart Sounds - Auscultation of the heart reveals - No Murmurs.  Abdomen Palpation/Percussion Tenderness - Abdomen is non-tender to palpation. Rigidity (guarding) - Abdomen is soft. Auscultation Auscultation of the abdomen reveals - Bowel sounds normal.  Female Genitourinary Note: Not done, not pertinent to present illness   Musculoskeletal Note: She is alert and oriented, in no apparent distress. Left hip can be flexed to 90, no internal rotation, about 10 of external rotation, 30 of abduction. Right hip  flexed 120, rotated in 30, out 40, abducted 40 without discomfort. She walks with a significantly antalgic gait pattern on the left.  Her radiograph, AP pelvis and lateral of the left hip taken today, showed bone on bone arthritis with subchondral cystic formation in the left hip.  Assessment & Plan Status post total right knee replacement [id number]) Primary osteoarthritis of left knee (M17.12) Primary osteoarthritis of left hip (M16.12)  Note:Surgical Plans: Left Total Hip Replacement - Anterior Approach  Disposition: Home  PCP: Dr. Sharlene Motts  IV TXA  Anesthesia Issues: None  Signed electronically by Ok Edwards, III PA-C

## 2016-09-27 ENCOUNTER — Inpatient Hospital Stay (HOSPITAL_COMMUNITY): Payer: 59

## 2016-09-27 ENCOUNTER — Inpatient Hospital Stay (HOSPITAL_COMMUNITY): Payer: 59 | Admitting: Anesthesiology

## 2016-09-27 ENCOUNTER — Encounter (HOSPITAL_COMMUNITY)
Admission: RE | Disposition: A | Discharge status: Home Health | Payer: Self-pay | Source: Ambulatory Visit | Attending: Orthopedic Surgery

## 2016-09-27 ENCOUNTER — Inpatient Hospital Stay (HOSPITAL_COMMUNITY)
Admission: RE | Admit: 2016-09-27 | Discharge: 2016-09-29 | DRG: 470 | Disposition: A | Discharge status: Home Health | Payer: 59 | Source: Ambulatory Visit | Attending: Orthopedic Surgery | Admitting: Orthopedic Surgery

## 2016-09-27 ENCOUNTER — Encounter (HOSPITAL_COMMUNITY): Payer: Self-pay | Admitting: *Deleted

## 2016-09-27 DIAGNOSIS — Z888 Allergy status to other drugs, medicaments and biological substances status: Secondary | ICD-10-CM | POA: Diagnosis not present

## 2016-09-27 DIAGNOSIS — Z79899 Other long term (current) drug therapy: Secondary | ICD-10-CM | POA: Diagnosis not present

## 2016-09-27 DIAGNOSIS — Z8619 Personal history of other infectious and parasitic diseases: Secondary | ICD-10-CM | POA: Diagnosis not present

## 2016-09-27 DIAGNOSIS — Z96649 Presence of unspecified artificial hip joint: Secondary | ICD-10-CM

## 2016-09-27 DIAGNOSIS — Z96641 Presence of right artificial hip joint: Secondary | ICD-10-CM | POA: Diagnosis present

## 2016-09-27 DIAGNOSIS — G8929 Other chronic pain: Secondary | ICD-10-CM | POA: Diagnosis present

## 2016-09-27 DIAGNOSIS — M169 Osteoarthritis of hip, unspecified: Secondary | ICD-10-CM | POA: Diagnosis present

## 2016-09-27 DIAGNOSIS — K589 Irritable bowel syndrome without diarrhea: Secondary | ICD-10-CM | POA: Diagnosis present

## 2016-09-27 DIAGNOSIS — E7800 Pure hypercholesterolemia, unspecified: Secondary | ICD-10-CM | POA: Diagnosis present

## 2016-09-27 DIAGNOSIS — M1711 Unilateral primary osteoarthritis, right knee: Secondary | ICD-10-CM | POA: Diagnosis present

## 2016-09-27 DIAGNOSIS — K219 Gastro-esophageal reflux disease without esophagitis: Secondary | ICD-10-CM | POA: Diagnosis present

## 2016-09-27 DIAGNOSIS — E785 Hyperlipidemia, unspecified: Secondary | ICD-10-CM | POA: Diagnosis present

## 2016-09-27 DIAGNOSIS — Z881 Allergy status to other antibiotic agents status: Secondary | ICD-10-CM | POA: Diagnosis not present

## 2016-09-27 DIAGNOSIS — M1612 Unilateral primary osteoarthritis, left hip: Principal | ICD-10-CM | POA: Diagnosis present

## 2016-09-27 DIAGNOSIS — Z85820 Personal history of malignant melanoma of skin: Secondary | ICD-10-CM | POA: Diagnosis not present

## 2016-09-27 DIAGNOSIS — G43909 Migraine, unspecified, not intractable, without status migrainosus: Secondary | ICD-10-CM | POA: Diagnosis present

## 2016-09-27 DIAGNOSIS — M5412 Radiculopathy, cervical region: Secondary | ICD-10-CM | POA: Diagnosis present

## 2016-09-27 HISTORY — PX: TOTAL HIP ARTHROPLASTY: SHX124

## 2016-09-27 HISTORY — DX: Enthesopathy, unspecified: M77.9

## 2016-09-27 LAB — TYPE AND SCREEN
ABO/RH(D): O POS
Antibody Screen: NEGATIVE

## 2016-09-27 SURGERY — ARTHROPLASTY, HIP, TOTAL, ANTERIOR APPROACH
Anesthesia: Monitor Anesthesia Care | Site: Hip | Laterality: Left

## 2016-09-27 MED ORDER — RIVAROXABAN 10 MG PO TABS
10.0000 mg | ORAL_TABLET | Freq: Every day | ORAL | Status: DC
  Administered 2016-09-28 – 2016-09-29 (×2): 10 mg via ORAL
  Filled 2016-09-27 (×2): qty 1

## 2016-09-27 MED ORDER — ACETAMINOPHEN 325 MG PO TABS: 650.0000 mg | ORAL_TABLET | Freq: Four times a day (QID) | ORAL | Status: DC | PRN

## 2016-09-27 MED ORDER — METHOCARBAMOL 1000 MG/10ML IJ SOLN
500.0000 mg | Freq: Four times a day (QID) | INTRAVENOUS | Status: DC | PRN
  Administered 2016-09-27: 500 mg via INTRAVENOUS
  Filled 2016-09-27: qty 5
  Filled 2016-09-27: qty 550

## 2016-09-27 MED ORDER — OXYCODONE HCL 5 MG PO TABS: 5.0000 mg | ORAL_TABLET | Freq: Once | ORAL | Status: DC | PRN

## 2016-09-27 MED ORDER — SUCCINYLCHOLINE CHLORIDE 200 MG/10ML IV SOSY
PREFILLED_SYRINGE | INTRAVENOUS | Status: AC
  Filled 2016-09-27: qty 10

## 2016-09-27 MED ORDER — ONDANSETRON HCL 4 MG/2ML IJ SOLN
4.0000 mg | Freq: Four times a day (QID) | INTRAMUSCULAR | Status: DC | PRN
  Administered 2016-09-28: 4 mg via INTRAVENOUS
  Filled 2016-09-27: qty 2

## 2016-09-27 MED ORDER — METOCLOPRAMIDE HCL 5 MG/ML IJ SOLN
5.0000 mg | Freq: Three times a day (TID) | INTRAMUSCULAR | Status: DC | PRN
  Administered 2016-09-27: 10 mg via INTRAVENOUS
  Filled 2016-09-27: qty 2

## 2016-09-27 MED ORDER — PANTOPRAZOLE SODIUM 40 MG PO TBEC
40.0000 mg | DELAYED_RELEASE_TABLET | Freq: Every day | ORAL | Status: DC
  Administered 2016-09-27 – 2016-09-28 (×2): 40 mg via ORAL
  Filled 2016-09-27 (×2): qty 1

## 2016-09-27 MED ORDER — SODIUM CHLORIDE 0.9 % IV SOLN
INTRAVENOUS | Status: DC
  Administered 2016-09-27 – 2016-09-28 (×2): via INTRAVENOUS

## 2016-09-27 MED ORDER — LIDOCAINE 2% (20 MG/ML) 5 ML SYRINGE
INTRAMUSCULAR | Status: DC | PRN
  Administered 2016-09-27: 100 mg via INTRAVENOUS

## 2016-09-27 MED ORDER — PROMETHAZINE HCL 25 MG PO TABS: 12.5000 mg | ORAL_TABLET | Freq: Four times a day (QID) | ORAL | Status: DC | PRN

## 2016-09-27 MED ORDER — DIPHENHYDRAMINE HCL 12.5 MG/5ML PO ELIX
12.5000 mg | ORAL_SOLUTION | ORAL | Status: DC | PRN
  Administered 2016-09-28 – 2016-09-29 (×2): 25 mg via ORAL
  Filled 2016-09-27 (×2): qty 10

## 2016-09-27 MED ORDER — PROMETHAZINE HCL 25 MG/ML IJ SOLN: 6.2500 mg | Freq: Four times a day (QID) | INTRAMUSCULAR | Status: DC | PRN

## 2016-09-27 MED ORDER — ROCURONIUM BROMIDE 50 MG/5ML IV SOSY
PREFILLED_SYRINGE | INTRAVENOUS | Status: AC
  Filled 2016-09-27: qty 5

## 2016-09-27 MED ORDER — PHENOL 1.4 % MT LIQD
1.0000 | OROMUCOSAL | Status: DC | PRN
  Filled 2016-09-27: qty 177

## 2016-09-27 MED ORDER — POTASSIUM GLUCONATE 595 (99 K) MG PO TABS
1190.0000 mg | ORAL_TABLET | Freq: Every evening | ORAL | Status: DC
  Administered 2016-09-27 – 2016-09-28 (×2): 1190 mg via ORAL
  Filled 2016-09-27 (×3): qty 2

## 2016-09-27 MED ORDER — ONDANSETRON HCL 4 MG/2ML IJ SOLN
INTRAMUSCULAR | Status: AC
  Filled 2016-09-27: qty 2

## 2016-09-27 MED ORDER — OXYCODONE HCL 5 MG/5ML PO SOLN
5.0000 mg | Freq: Once | ORAL | Status: DC | PRN
  Filled 2016-09-27: qty 5

## 2016-09-27 MED ORDER — VANCOMYCIN HCL 1000 MG IV SOLR
INTRAVENOUS | Status: DC | PRN
  Administered 2016-09-27: 1000 mg via INTRAVENOUS

## 2016-09-27 MED ORDER — CHLORHEXIDINE GLUCONATE 4 % EX LIQD: 60.0000 mL | Freq: Once | CUTANEOUS | Status: DC

## 2016-09-27 MED ORDER — ONDANSETRON HCL 4 MG/2ML IJ SOLN
INTRAMUSCULAR | Status: DC | PRN
Start: 2016-09-27 — End: 2016-09-27
  Administered 2016-09-27: 4 mg via INTRAVENOUS

## 2016-09-27 MED ORDER — SUGAMMADEX SODIUM 200 MG/2ML IV SOLN
INTRAVENOUS | Status: DC | PRN
  Administered 2016-09-27: 200 mg via INTRAVENOUS

## 2016-09-27 MED ORDER — ROCURONIUM BROMIDE 10 MG/ML (PF) SYRINGE
PREFILLED_SYRINGE | INTRAVENOUS | Status: DC | PRN
  Administered 2016-09-27: 50 mg via INTRAVENOUS

## 2016-09-27 MED ORDER — SODIUM CHLORIDE 0.9 % IV SOLN
1000.0000 mg | INTRAVENOUS | Status: AC
  Administered 2016-09-27: 1000 mg via INTRAVENOUS
  Filled 2016-09-27: qty 10

## 2016-09-27 MED ORDER — PROPOFOL 10 MG/ML IV BOLUS
INTRAVENOUS | Status: AC
  Filled 2016-09-27: qty 20

## 2016-09-27 MED ORDER — FENTANYL CITRATE (PF) 100 MCG/2ML IJ SOLN
INTRAMUSCULAR | Status: AC
  Filled 2016-09-27: qty 2

## 2016-09-27 MED ORDER — DEXAMETHASONE SODIUM PHOSPHATE 10 MG/ML IJ SOLN
INTRAMUSCULAR | Status: AC
  Filled 2016-09-27: qty 1

## 2016-09-27 MED ORDER — HYDROCHLOROTHIAZIDE 25 MG PO TABS
25.0000 mg | ORAL_TABLET | Freq: Every day | ORAL | Status: DC
  Administered 2016-09-28 – 2016-09-29 (×2): 25 mg via ORAL
  Filled 2016-09-27 (×2): qty 1

## 2016-09-27 MED ORDER — BISACODYL 10 MG RE SUPP
10.0000 mg | Freq: Every day | RECTAL | Status: DC | PRN
Start: 2016-09-27 — End: 2016-09-29

## 2016-09-27 MED ORDER — ACETAMINOPHEN 10 MG/ML IV SOLN
1000.0000 mg | Freq: Once | INTRAVENOUS | Status: AC
  Administered 2016-09-27: 1000 mg via INTRAVENOUS
  Filled 2016-09-27: qty 100

## 2016-09-27 MED ORDER — PROMETHAZINE HCL 25 MG/ML IJ SOLN
6.2500 mg | Freq: Four times a day (QID) | INTRAMUSCULAR | Status: DC | PRN
  Administered 2016-09-27: 12.5 mg via INTRAVENOUS
  Filled 2016-09-27: qty 1

## 2016-09-27 MED ORDER — HYOSCYAMINE SULFATE 0.125 MG SL SUBL
0.1250 mg | SUBLINGUAL_TABLET | SUBLINGUAL | Status: DC | PRN
  Filled 2016-09-27: qty 1

## 2016-09-27 MED ORDER — PROPOFOL 10 MG/ML IV BOLUS
INTRAVENOUS | Status: DC | PRN
Start: 2016-09-27 — End: 2016-09-27
  Administered 2016-09-27: 200 mg via INTRAVENOUS

## 2016-09-27 MED ORDER — PREDNISONE 5 MG PO TABS
10.0000 mg | ORAL_TABLET | Freq: Two times a day (BID) | ORAL | Status: AC
  Administered 2016-09-28 (×2): 10 mg via ORAL
  Filled 2016-09-27 (×2): qty 2

## 2016-09-27 MED ORDER — DEXAMETHASONE SODIUM PHOSPHATE 10 MG/ML IJ SOLN: 10.0000 mg | Freq: Once | INTRAMUSCULAR | Status: DC

## 2016-09-27 MED ORDER — SCOPOLAMINE 1 MG/3DAYS TD PT72
MEDICATED_PATCH | TRANSDERMAL | Status: AC
  Filled 2016-09-27: qty 1

## 2016-09-27 MED ORDER — DEXAMETHASONE SODIUM PHOSPHATE 10 MG/ML IJ SOLN
INTRAMUSCULAR | Status: DC | PRN
  Administered 2016-09-27: 10 mg via INTRAVENOUS

## 2016-09-27 MED ORDER — LIDOCAINE 2% (20 MG/ML) 5 ML SYRINGE
INTRAMUSCULAR | Status: AC
  Filled 2016-09-27: qty 5

## 2016-09-27 MED ORDER — BUPIVACAINE HCL (PF) 0.25 % IJ SOLN
INTRAMUSCULAR | Status: AC
  Filled 2016-09-27: qty 30

## 2016-09-27 MED ORDER — DEXAMETHASONE SODIUM PHOSPHATE 10 MG/ML IJ SOLN
10.0000 mg | Freq: Once | INTRAMUSCULAR | Status: AC
  Administered 2016-09-28: 10 mg via INTRAVENOUS
  Filled 2016-09-27: qty 1

## 2016-09-27 MED ORDER — METOCLOPRAMIDE HCL 5 MG PO TABS: 5.0000 mg | ORAL_TABLET | Freq: Three times a day (TID) | ORAL | Status: DC | PRN

## 2016-09-27 MED ORDER — VANCOMYCIN HCL IN DEXTROSE 1-5 GM/200ML-% IV SOLN
INTRAVENOUS | Status: AC
  Filled 2016-09-27: qty 200

## 2016-09-27 MED ORDER — PREDNISONE 5 MG PO TABS
5.0000 mg | ORAL_TABLET | Freq: Two times a day (BID) | ORAL | Status: DC
  Administered 2016-09-29: 5 mg via ORAL
  Filled 2016-09-27: qty 1

## 2016-09-27 MED ORDER — HYDROMORPHONE HCL 2 MG PO TABS
2.0000 mg | ORAL_TABLET | ORAL | Status: DC | PRN
  Administered 2016-09-27 – 2016-09-29 (×9): 4 mg via ORAL
  Filled 2016-09-27 (×9): qty 2

## 2016-09-27 MED ORDER — ACETAMINOPHEN 650 MG RE SUPP: 650.0000 mg | Freq: Four times a day (QID) | RECTAL | Status: DC | PRN

## 2016-09-27 MED ORDER — DOCUSATE SODIUM 100 MG PO CAPS
100.0000 mg | ORAL_CAPSULE | Freq: Two times a day (BID) | ORAL | Status: DC
  Administered 2016-09-27 – 2016-09-29 (×4): 100 mg via ORAL
  Filled 2016-09-27 (×4): qty 1

## 2016-09-27 MED ORDER — FLEET ENEMA 7-19 GM/118ML RE ENEM: 1.0000 | ENEMA | Freq: Once | RECTAL | Status: DC | PRN

## 2016-09-27 MED ORDER — HYDROMORPHONE HCL 1 MG/ML IJ SOLN
INTRAMUSCULAR | Status: AC
  Filled 2016-09-27: qty 1

## 2016-09-27 MED ORDER — FENTANYL CITRATE (PF) 100 MCG/2ML IJ SOLN
INTRAMUSCULAR | Status: AC
Start: 2016-09-27 — End: 2016-09-27
  Filled 2016-09-27: qty 2

## 2016-09-27 MED ORDER — SUCCINYLCHOLINE CHLORIDE 200 MG/10ML IV SOSY
PREFILLED_SYRINGE | INTRAVENOUS | Status: DC | PRN
  Administered 2016-09-27: 140 mg via INTRAVENOUS

## 2016-09-27 MED ORDER — FENTANYL CITRATE (PF) 100 MCG/2ML IJ SOLN
25.0000 ug | INTRAMUSCULAR | Status: DC | PRN
  Administered 2016-09-27 (×2): 50 ug via INTRAVENOUS

## 2016-09-27 MED ORDER — POLYETHYLENE GLYCOL 3350 17 G PO PACK
17.0000 g | PACK | Freq: Every day | ORAL | Status: DC
  Administered 2016-09-27 – 2016-09-28 (×2): 17 g via ORAL
  Filled 2016-09-27 (×2): qty 1

## 2016-09-27 MED ORDER — PREDNISONE 5 MG PO TABS: 5.0000 mg | ORAL_TABLET | Freq: Every day | ORAL | Status: DC

## 2016-09-27 MED ORDER — FENTANYL CITRATE (PF) 100 MCG/2ML IJ SOLN
INTRAMUSCULAR | Status: DC | PRN
  Administered 2016-09-27 (×3): 50 ug via INTRAVENOUS

## 2016-09-27 MED ORDER — ONDANSETRON HCL 4 MG/2ML IJ SOLN
4.0000 mg | Freq: Four times a day (QID) | INTRAMUSCULAR | Status: AC | PRN
  Administered 2016-09-27: 4 mg via INTRAVENOUS

## 2016-09-27 MED ORDER — POLYETHYLENE GLYCOL 3350 17 G PO PACK: 17.0000 g | PACK | Freq: Every day | ORAL | Status: DC | PRN

## 2016-09-27 MED ORDER — PROPRANOLOL HCL 40 MG PO TABS
40.0000 mg | ORAL_TABLET | Freq: Every day | ORAL | Status: DC
  Administered 2016-09-27 – 2016-09-28 (×2): 40 mg via ORAL
  Filled 2016-09-27 (×2): qty 1

## 2016-09-27 MED ORDER — BUPIVACAINE HCL (PF) 0.25 % IJ SOLN
INTRAMUSCULAR | Status: DC | PRN
  Administered 2016-09-27: 30 mL

## 2016-09-27 MED ORDER — LORATADINE 10 MG PO TABS
10.0000 mg | ORAL_TABLET | Freq: Every day | ORAL | Status: DC
  Administered 2016-09-28 – 2016-09-29 (×2): 10 mg via ORAL
  Filled 2016-09-27 (×2): qty 1

## 2016-09-27 MED ORDER — SUGAMMADEX SODIUM 200 MG/2ML IV SOLN
INTRAVENOUS | Status: AC
  Filled 2016-09-27: qty 2

## 2016-09-27 MED ORDER — LACTATED RINGERS IV SOLN
INTRAVENOUS | Status: DC | PRN
  Administered 2016-09-27 (×2): via INTRAVENOUS

## 2016-09-27 MED ORDER — METHOCARBAMOL 500 MG PO TABS
500.0000 mg | ORAL_TABLET | Freq: Four times a day (QID) | ORAL | Status: DC | PRN
  Administered 2016-09-28 – 2016-09-29 (×2): 500 mg via ORAL
  Filled 2016-09-27 (×2): qty 1

## 2016-09-27 MED ORDER — ACETAMINOPHEN 500 MG PO TABS
1000.0000 mg | ORAL_TABLET | Freq: Four times a day (QID) | ORAL | Status: AC
  Administered 2016-09-27 – 2016-09-28 (×3): 1000 mg via ORAL
  Filled 2016-09-27 (×3): qty 2

## 2016-09-27 MED ORDER — MIDAZOLAM HCL 2 MG/2ML IJ SOLN
INTRAMUSCULAR | Status: AC
  Filled 2016-09-27: qty 2

## 2016-09-27 MED ORDER — SCOPOLAMINE 1 MG/3DAYS TD PT72
MEDICATED_PATCH | TRANSDERMAL | Status: DC | PRN
  Administered 2016-09-27: 1 via TRANSDERMAL

## 2016-09-27 MED ORDER — OXYCODONE HCL 5 MG PO TABS: 5.0000 mg | ORAL_TABLET | ORAL | Status: DC | PRN

## 2016-09-27 MED ORDER — CEFAZOLIN SODIUM-DEXTROSE 2-4 GM/100ML-% IV SOLN
2.0000 g | Freq: Four times a day (QID) | INTRAVENOUS | Status: AC
  Administered 2016-09-27 (×2): 2 g via INTRAVENOUS
  Filled 2016-09-27 (×2): qty 100

## 2016-09-27 MED ORDER — DIAZEPAM 5 MG PO TABS
5.0000 mg | ORAL_TABLET | Freq: Four times a day (QID) | ORAL | Status: DC | PRN
  Administered 2016-09-29: 5 mg via ORAL
  Filled 2016-09-27: qty 1

## 2016-09-27 MED ORDER — MIDAZOLAM HCL 5 MG/5ML IJ SOLN
INTRAMUSCULAR | Status: DC | PRN
  Administered 2016-09-27: 2 mg via INTRAVENOUS

## 2016-09-27 MED ORDER — CEFAZOLIN SODIUM-DEXTROSE 2-3 GM-% IV SOLR
2.0000 g | Freq: Once | INTRAVENOUS | Status: AC
  Administered 2016-09-27: 2 g via INTRAVENOUS
  Filled 2016-09-27: qty 50

## 2016-09-27 MED ORDER — CEFAZOLIN SODIUM-DEXTROSE 2-4 GM/100ML-% IV SOLN
INTRAVENOUS | Status: AC
  Filled 2016-09-27: qty 100

## 2016-09-27 MED ORDER — MENTHOL 3 MG MT LOZG: 1.0000 | LOZENGE | OROMUCOSAL | Status: DC | PRN

## 2016-09-27 MED ORDER — ONDANSETRON HCL 4 MG PO TABS: 4.0000 mg | ORAL_TABLET | Freq: Four times a day (QID) | ORAL | Status: DC | PRN

## 2016-09-27 MED ORDER — TRAMADOL HCL 50 MG PO TABS
50.0000 mg | ORAL_TABLET | Freq: Four times a day (QID) | ORAL | Status: DC | PRN
  Administered 2016-09-28: 100 mg via ORAL
  Filled 2016-09-27: qty 2

## 2016-09-27 MED ORDER — TRANEXAMIC ACID 1000 MG/10ML IV SOLN
1000.0000 mg | Freq: Once | INTRAVENOUS | Status: AC
  Administered 2016-09-27: 1000 mg via INTRAVENOUS
  Filled 2016-09-27: qty 10

## 2016-09-27 MED ORDER — HYDROMORPHONE HCL 1 MG/ML IJ SOLN
0.2500 mg | INTRAMUSCULAR | Status: DC | PRN
  Administered 2016-09-27 (×4): 0.5 mg via INTRAVENOUS

## 2016-09-27 MED ORDER — ACETAMINOPHEN 10 MG/ML IV SOLN
INTRAVENOUS | Status: AC
  Filled 2016-09-27: qty 100

## 2016-09-27 MED ORDER — MORPHINE SULFATE (PF) 2 MG/ML IV SOLN
1.0000 mg | INTRAVENOUS | Status: DC | PRN
  Administered 2016-09-27 – 2016-09-28 (×4): 1 mg via INTRAVENOUS
  Filled 2016-09-27 (×4): qty 1

## 2016-09-27 MED ORDER — PREDNISONE 20 MG PO TABS
20.0000 mg | ORAL_TABLET | Freq: Every day | ORAL | Status: AC
  Administered 2016-09-27: 20 mg via ORAL
  Filled 2016-09-27: qty 1

## 2016-09-27 MED ORDER — EZETIMIBE 10 MG PO TABS
10.0000 mg | ORAL_TABLET | Freq: Every day | ORAL | Status: DC
  Administered 2016-09-29: 10 mg via ORAL
  Filled 2016-09-27 (×2): qty 1

## 2016-09-27 SURGICAL SUPPLY — 35 items
BAG DECANTER FOR FLEXI CONT (MISCELLANEOUS) ×2 IMPLANT
BAG ZIPLOCK 12X15 (MISCELLANEOUS) IMPLANT
BLADE SAG 18X100X1.27 (BLADE) ×2 IMPLANT
CAPT HIP TOTAL 2 ×2 IMPLANT
CLOTH BEACON ORANGE TIMEOUT ST (SAFETY) ×2 IMPLANT
COVER PERINEAL POST (MISCELLANEOUS) ×2 IMPLANT
DECANTER SPIKE VIAL GLASS SM (MISCELLANEOUS) ×2 IMPLANT
DRAPE STERI IOBAN 125X83 (DRAPES) ×2 IMPLANT
DRAPE U-SHAPE 47X51 STRL (DRAPES) ×4 IMPLANT
DRSG ADAPTIC 3X8 NADH LF (GAUZE/BANDAGES/DRESSINGS) ×2 IMPLANT
DRSG MEPILEX BORDER 4X4 (GAUZE/BANDAGES/DRESSINGS) ×2 IMPLANT
DRSG MEPILEX BORDER 4X8 (GAUZE/BANDAGES/DRESSINGS) ×2 IMPLANT
DURAPREP 26ML APPLICATOR (WOUND CARE) ×2 IMPLANT
ELECT REM PT RETURN 9FT ADLT (ELECTROSURGICAL) ×2
ELECTRODE REM PT RTRN 9FT ADLT (ELECTROSURGICAL) ×1 IMPLANT
EVACUATOR 1/8 PVC DRAIN (DRAIN) ×2 IMPLANT
GLOVE BIO SURGEON STRL SZ7.5 (GLOVE) ×2 IMPLANT
GLOVE BIO SURGEON STRL SZ8 (GLOVE) ×4 IMPLANT
GLOVE BIOGEL PI IND STRL 8 (GLOVE) ×2 IMPLANT
GLOVE BIOGEL PI INDICATOR 8 (GLOVE) ×2
GOWN STRL REUS W/TWL LRG LVL3 (GOWN DISPOSABLE) ×2 IMPLANT
GOWN STRL REUS W/TWL XL LVL3 (GOWN DISPOSABLE) ×2 IMPLANT
NS IRRIG 1000ML POUR BTL (IV SOLUTION) ×2 IMPLANT
PACK ANTERIOR HIP CUSTOM (KITS) ×2 IMPLANT
STRIP CLOSURE SKIN 1/2X4 (GAUZE/BANDAGES/DRESSINGS) ×2 IMPLANT
SUT ETHIBOND NAB CT1 #1 30IN (SUTURE) ×2 IMPLANT
SUT MNCRL AB 4-0 PS2 18 (SUTURE) ×2 IMPLANT
SUT VIC AB 2-0 CT1 27 (SUTURE) ×2
SUT VIC AB 2-0 CT1 TAPERPNT 27 (SUTURE) ×2 IMPLANT
SUT VLOC 180 0 24IN GS25 (SUTURE) ×2 IMPLANT
SYR 50ML LL SCALE MARK (SYRINGE) IMPLANT
TRAY FOLEY CATH 14FRSI W/METER (CATHETERS) ×2 IMPLANT
TRAY FOLEY W/METER SILVER 16FR (SET/KITS/TRAYS/PACK) IMPLANT
WATER STERILE IRR 1000ML POUR (IV SOLUTION) ×4 IMPLANT
YANKAUER SUCT BULB TIP 10FT TU (MISCELLANEOUS) ×2 IMPLANT

## 2016-09-27 NOTE — Anesthesia Preprocedure Evaluation (Addendum)
Anesthesia Evaluation  Patient identified by MRN, date of birth, ID band Patient awake    Reviewed: Allergy & Precautions, H&P , NPO status , Patient's Chart, lab work & pertinent test results  History of Anesthesia Complications (+) PONV  Airway Mallampati: II   Neck ROM: full    Dental   Pulmonary former smoker,    breath sounds clear to auscultation       Cardiovascular negative cardio ROS   Rhythm:regular Rate:Normal     Neuro/Psych  Headaches,  Neuromuscular disease    GI/Hepatic GERD  ,  Endo/Other    Renal/GU      Musculoskeletal  (+) Arthritis ,   Abdominal   Peds  Hematology   Anesthesia Other Findings   Reproductive/Obstetrics                             Anesthesia Physical Anesthesia Plan  ASA: II  Anesthesia Plan: General   Post-op Pain Management:    Induction: Intravenous  Airway Management Planned: Oral ETT  Additional Equipment:   Intra-op Plan:   Post-operative Plan: Extubation in OR  Informed Consent: I have reviewed the patients History and Physical, chart, labs and discussed the procedure including the risks, benefits and alternatives for the proposed anesthesia with the patient or authorized representative who has indicated his/her understanding and acceptance.     Plan Discussed with: CRNA, Anesthesiologist and Surgeon  Anesthesia Plan Comments:        Anesthesia Quick Evaluation

## 2016-09-27 NOTE — H&P (View-Only) (Signed)
Victoria Mosley DOB: 1951-09-07 Married / Language: English / Race: White Female Date of Admission:  09/27/2016 CC:  Left Hip Pain History of Present Illness  The patient is a 65 year old female who comes in  for a preoperative History and Physical. The patient is scheduled for a left total hip arthroplasty (anterior) to be performed by Dr. Dione Plover. Aluisio, MD at [facility] on 09-27-2016. The patient is a 65 year old female who presented for follow up of their hip. The patient is being followed for their left hip pain and osteoarthritis. They are months out from I-A injection. Symptoms reported include: pain. The following medication has been used for pain control: Aleve. Note for "Follow-up Hip": Patient states that the injection did not help at all. She does not want another injection. Her left hip is getting progressively worse. The intraarticular injection did not provide any benefit. The hip is hurting at all times. It is limiting what she can and cannot do. She has reached a point where she would lile to get the hip fixed at this time. They have been treated conservatively in the past for the above stated problem and despite conservative measures, they continue to have progressive pain and severe functional limitations and dysfunction. They have failed non-operative management including home exercise, medications, and injections. It is felt that they would benefit from undergoing total joint replacement. Risks and benefits of the procedure have been discussed with the patient and they elect to proceed with surgery. There are no active contraindications to surgery such as ongoing infection or rapidly progressive neurological disease.  Problem List/Past Medical  Acute pain of left shoulder (M25.512)  Radiculitis of left cervical region (M54.12)  Primary osteoarthritis of left hip (M16.12)  Pain of left hip joint (M25.552)  Status post total right knee replacement [id number])   Degenerative Disc Disease  Chronic Cystitis  Kidney Stone  Skin Cancer  Urinary Incontinence  Diverticulitis Of Colon  Irritable bowel syndrome  Hemorrhoids  Gastroesophageal Reflux Disease  Hypercholesterolemia  Pneumonia  Bronchitis  Tinnitus  Impaired Vision  Migraine Headache  Anxiety Disorder  Depression  Chronic Pain  Osteoarthritis  Primary osteoarthritis of right knee (M17.11)  Measles  Mumps  Menopause  Allergies  Levaquin *FLUOROQUINOLONES*  Biaxin *MACROLIDES*  stomach upset Pravastatin Sodium *ANTIHYPERLIPIDEMICS*  joint pain  Family History  Osteoarthritis  mother, father and sister Hypertension  mother, father and sister Heart disease in female family member before age 55  Severe allergy  mother Cancer  father, sister and grandmother fathers side Heart Disease  mother, sister, grandfather mothers side and grandfather fathers side Depression  mother  Social History Drug/Alcohol Rehab (Previously)  no Drug/Alcohol Rehab (Currently)  no Current work status  working full time Living situation  live with spouse Illicit drug use  no Exercise  Exercises weekly; does running / walking Children  1 Alcohol use  never consumed alcohol Pain Contract  no Number of flights of stairs before winded  2-3 Marital status  married Tobacco use  Former smoker. former smoker; smoke(d) less than 1/2 pack(s) per day Tobacco / smoke exposure  no Advance Directives  Living Will, Healthcare POA  Medication History Calcium Citrate (Oral) Specific strength unknown - Active. Potassium Gluconate (Oral) Specific strength unknown - Active. Fish Oil Active. Flaxseed Oil Active. Vitamin B Complex (Oral) Active. Vitamin C ER (1000MG  Tablet ER, Oral) Active. Womens 50+ Advanced (Oral) Active. Sudafed 12 Hour (120MG  Tablet ER 12HR, Oral as needed) Active.  Dramamine (50MG  Tablet, Oral as needed) Active. Excedrin Migraine  (250-250-65MG  Tablet, Oral as needed) Active. MiraLax (Oral) Active. Benadryl Allergy (25MG  Capsule, Oral) Active. Aleve (220MG  Tablet, Oral) Active. Probiotic (Oral) Specific strength unknown - Active. Cranberry (500MG  Capsule, Oral) Active. Loratadine (10MG  Tablet, Oral) Active. Benzoyl Peroxide (5% Gel, External as needed) Active. Beta-Val (0.1% Cream, External) Active. Valium (5MG  Tablet, Oral) Active. (as needed) PredniSONE (10MG  Tablet, Oral) Active. Hyoscyamine Sulfate (0.125MG  Tab Sublingual, Sublingual) Active. Fioricet (50-325-40MG  Tablet, Oral as needed) Active. Propranolol HCl (40MG  Tablet, Oral) Active. Omeprazole (20MG  Tablet DR, Oral) Active. Zetia (10MG  Tablet, Oral) Active. Ogen (0.625MG  Tablet, Oral) Active. Provera (2.5MG  Tablet, Oral) Active. Hydrochlorothiazide (25MG  Tablet, Oral) Active.  Past Surgical History Tubal Ligation  Left maxillary sinus surgery [03/2016]: Total Hip Replacement  right Colon Polyp Removal - Colonoscopy  Cesarean Delivery  1 time Foot Surgery  bilateral Straighten Nasal Septum  Spinal Surgery     Review of Systems General Present- Fatigue. Not Present- Chills, Fever, Memory Loss, Night Sweats, Weight Gain and Weight Loss. Skin Not Present- Eczema, Hives, Itching, Lesions and Rash. HEENT Present- Blurred Vision, Headache and Tinnitus. Not Present- Dentures, Double Vision, Hearing Loss and Visual Loss. Respiratory Present- Cough. Not Present- Allergies, Chronic Cough, Coughing up blood, Shortness of breath at rest and Shortness of breath with exertion. Cardiovascular Not Present- Chest Pain, Difficulty Breathing Lying Down, Murmur, Palpitations, Racing/skipping heartbeats and Swelling. Gastrointestinal Present- Bloody Stool (hemorrhoids) and Constipation. Not Present- Abdominal Pain, Diarrhea, Difficulty Swallowing, Heartburn, Jaundice, Loss of appetitie, Nausea and Vomiting. Female Genitourinary Present-  Incontinence, Urinary frequency, Urinating at Night and Weak urinary stream. Not Present- Blood in Urine, Discharge, Flank Pain, Painful Urination, Urgency and Urinary Retention. Musculoskeletal Present- Back Pain, Joint Pain, Joint Swelling, Morning Stiffness, Muscle Pain, Muscle Weakness and Spasms. Neurological Not Present- Blackout spells, Difficulty with balance, Dizziness, Paralysis, Tremor and Weakness. Psychiatric Present- Insomnia.  Vitals  Weight: 203 lb Height: 67in Weight was reported by patient. Height was reported by patient. Body Surface Area: 2.03 m Body Mass Index: 31.79 kg/m  Pulse: 76 (Regular)  BP: 138/80 (Sitting, Left Arm, Standard)  Physical Exam  General Mental Status -Alert, cooperative and good historian. General Appearance-pleasant, Not in acute distress. Orientation-Oriented X3. Build & Nutrition-Well nourished and Well developed.  Head and Neck Head-normocephalic, atraumatic . Neck Global Assessment - supple, no bruit auscultated on the right, no bruit auscultated on the left.  Eye Pupil - Bilateral-Regular and Round. Motion - Bilateral-EOMI.  Chest and Lung Exam Auscultation Breath sounds - clear at anterior chest wall and clear at posterior chest wall. Adventitious sounds - No Adventitious sounds.  Cardiovascular Auscultation Rhythm - Regular rate and rhythm. Heart Sounds - S1 WNL and S2 WNL. Murmurs & Other Heart Sounds - Auscultation of the heart reveals - No Murmurs.  Abdomen Palpation/Percussion Tenderness - Abdomen is non-tender to palpation. Rigidity (guarding) - Abdomen is soft. Auscultation Auscultation of the abdomen reveals - Bowel sounds normal.  Female Genitourinary Note: Not done, not pertinent to present illness   Musculoskeletal Note: She is alert and oriented, in no apparent distress. Left hip can be flexed to 90, no internal rotation, about 10 of external rotation, 30 of abduction. Right hip  flexed 120, rotated in 30, out 40, abducted 40 without discomfort. She walks with a significantly antalgic gait pattern on the left.  Her radiograph, AP pelvis and lateral of the left hip taken today, showed bone on bone arthritis with subchondral cystic formation in the left hip.  Assessment & Plan Status post total right knee replacement [id number]) Primary osteoarthritis of left knee (M17.12) Primary osteoarthritis of left hip (M16.12)  Note:Surgical Plans: Left Total Hip Replacement - Anterior Approach  Disposition: Home  PCP: Dr. Sharlene Motts  IV TXA  Anesthesia Issues: None  Signed electronically by Ok Edwards, III PA-C

## 2016-09-27 NOTE — Transfer of Care (Signed)
Immediate Anesthesia Transfer of Care Note  Patient: Victoria Mosley  Procedure(s) Performed: Procedure(s): LEFT TOTAL HIP ARTHROPLASTY ANTERIOR APPROACH (Left)  Patient Location: PACU  Anesthesia Type:General  Level of Consciousness: sedated  Airway & Oxygen Therapy: Patient Spontanous Breathing and Patient connected to face mask oxygen  Post-op Assessment: Report given to RN and Post -op Vital signs reviewed and stable  Post vital signs: Reviewed and stable  Last Vitals:  Vitals:   09/27/16 0542  BP: 111/60  Pulse: 73  Resp: 18  Temp: 36.9 C    Last Pain:  Vitals:   09/27/16 0618  TempSrc:   PainSc: 5       Patients Stated Pain Goal: 4 ([id number])  Complications: No apparent anesthesia complications

## 2016-09-27 NOTE — Interval H&P Note (Signed)
History and Physical Interval Note:  09/27/2016 7:15 AM  Victoria Mosley  has presented today for surgery, with the diagnosis of LEFT HIP OA  The various methods of treatment have been discussed with the patient and family. After consideration of risks, benefits and other options for treatment, the patient has consented to  Procedure(s): LEFT TOTAL HIP ARTHROPLASTY ANTERIOR APPROACH (Left) as a surgical intervention .  The patient's history has been reviewed, patient examined, no change in status, stable for surgery.  I have reviewed the patient's chart and labs.  Questions were answered to the patient's satisfaction.     [name]

## 2016-09-27 NOTE — Anesthesia Procedure Notes (Signed)
Procedure Name: Intubation Date/Time: 09/27/2016 7:24 AM Performed by: Lind Covert Pre-anesthesia Checklist: Patient identified, Timeout performed, Emergency Drugs available, Suction available and Patient being monitored Patient Re-evaluated:Patient Re-evaluated prior to inductionOxygen Delivery Method: Circle system utilized Preoxygenation: Pre-oxygenation with 100% oxygen Intubation Type: IV induction Laryngoscope Size: Mac and 4 Grade View: Grade II Tube type: Oral Tube size: 7.0 mm Number of attempts: 1 Airway Equipment and Method: Stylet Placement Confirmation: ETT inserted through vocal cords under direct vision,  positive ETCO2 and breath sounds checked- equal and bilateral Secured at: 22 cm Tube secured with: Tape Dental Injury: Teeth and Oropharynx as per pre-operative assessment

## 2016-09-27 NOTE — Op Note (Signed)
OPERATIVE REPORT- TOTAL HIP ARTHROPLASTY   PREOPERATIVE DIAGNOSIS: Osteoarthritis of the Left hip.   POSTOPERATIVE DIAGNOSIS: Osteoarthritis of the Left  hip.   PROCEDURE: Left total hip arthroplasty, anterior approach.   SURGEON: Gaynelle Arabian, MD   ASSISTANT: Arlee Muslim, PA-C  ANESTHESIA:  General  ESTIMATED BLOOD LOSS:-500 ml    DRAINS: Hemovac x1.   COMPLICATIONS: None   CONDITION: PACU - hemodynamically stable.   BRIEF CLINICAL NOTE: Victoria Mosley is a 65 y.o. female who has advanced end-  stage arthritis of their Left  hip with progressively worsening pain and  dysfunction.The patient has failed nonoperative management and presents for  total hip arthroplasty.   PROCEDURE IN DETAIL: After successful administration of spinal  anesthetic, the traction boots for the [facility] bed were placed on both  feet and the patient was placed onto the [facility] bed, boots placed into the leg  holders. The Left hip was then isolated from the perineum with plastic  drapes and prepped and draped in the usual sterile fashion. ASIS and  greater trochanter were marked and a oblique incision was made, starting  at about 1 cm lateral and 2 cm distal to the ASIS and coursing towards  the anterior cortex of the femur. The skin was cut with a 10 blade  through subcutaneous tissue to the level of the fascia overlying the  tensor fascia lata muscle. The fascia was then incised in line with the  incision at the junction of the anterior third and posterior 2/3rd. The  muscle was teased off the fascia and then the interval between the TFL  and the rectus was developed. The Hohmann retractor was then placed at  the top of the femoral neck over the capsule. The vessels overlying the  capsule were cauterized and the fat on top of the capsule was removed.  A Hohmann retractor was then placed anterior underneath the rectus  femoris to give exposure to the entire anterior capsule. A T-shaped   capsulotomy was performed. The edges were tagged and the femoral head  was identified.       Osteophytes are removed off the superior acetabulum.  The femoral neck was then cut in situ with an oscillating saw. Traction  was then applied to the left lower extremity utilizing the [facility]  traction. The femoral head was then removed. Retractors were placed  around the acetabulum and then circumferential removal of the labrum was  performed. Osteophytes were also removed. Reaming starts at 45 mm to  medialize and  Increased in 2 mm increments to 49 mm. We reamed in  approximately 40 degrees of abduction, 20 degrees anteversion. A 50 mm  pinnacle acetabular shell was then impacted in anatomic position under  fluoroscopic guidance with excellent purchase. We did not need to place  any additional dome screws. A 32 mm neutral + 4 marathon liner was then  placed into the acetabular shell.       The femoral lift was then placed along the lateral aspect of the femur  just distal to the vastus ridge. The leg was  externally rotated and capsule  was stripped off the inferior aspect of the femoral neck down to the  level of the lesser trochanter, this was done with electrocautery. The femur was lifted after this was performed. The  leg was then placed in an extended and adducted position essentially delivering the femur. We also removed the capsule superiorly and the piriformis from the piriformis  fossa to gain excellent exposure of the  proximal femur. Rongeur was used to remove some cancellous bone to get  into the lateral portion of the proximal femur for placement of the  initial starter reamer. The starter broaches was placed  the starter broach  and was shown to go down the center of the canal. Broaching  with the  Corail system was then performed starting at size 8, coursing  Up to size 12. A size 12 had excellent torsional and rotational  and axial stability. The trial high offset neck was then  placed  with a 32 + 5 trial head. The hip was then reduced. We confirmed that  the stem was in the canal both on AP and lateral x-rays. It also has excellent sizing. The hip was reduced with outstanding stability through full extension and full external rotation.. AP pelvis was taken and the leg lengths were measured and found to be equal. Hip was then dislocated again and the femoral head and neck removed. The  femoral broach was removed. Size 12 Corail stem with a high offset  neck was then impacted into the femur following native anteversion. Has  excellent purchase in the canal. Excellent torsional and rotational and  axial stability. It is confirmed to be in the canal on AP and lateral  fluoroscopic views. The 32 + 5 ceramic head was placed and the hip  reduced with outstanding stability. Again AP pelvis was taken and it  confirmed that the leg lengths were equal. The wound was then copiously  irrigated with saline solution and the capsule reattached and repaired  with Ethibond suture. 30 ml of .25% Bupivicaine was  injected into the capsule and into the edge of the tensor fascia lata as well as subcutaneous tissue. The fascia overlying the tensor fascia lata was then closed with a running #1 V-Loc. Subcu was closed with interrupted 2-0 Vicryl and subcuticular running 4-0 Monocryl. Incision was cleaned  and dried. Steri-Strips and a bulky sterile dressing applied. Hemovac  drain was hooked to suction and then the patient was awakened and transported to  recovery in stable condition.        Please note that a surgical assistant was a medical necessity for this procedure to perform it in a safe and expeditious manner. Assistant was necessary to provide appropriate retraction of vital neurovascular structures and to prevent femoral fracture and allow for anatomic placement of the prosthesis.  Gaynelle Arabian, M.D.

## 2016-09-27 NOTE — Anesthesia Postprocedure Evaluation (Signed)
Anesthesia Post Note  Patient: Victoria Mosley  Procedure(s) Performed: Procedure(s) (LRB): LEFT TOTAL HIP ARTHROPLASTY ANTERIOR APPROACH (Left)  Patient location during evaluation: PACU Anesthesia Type: General Level of consciousness: awake and alert and patient cooperative Pain management: pain level controlled Vital Signs Assessment: post-procedure vital signs reviewed and stable Respiratory status: spontaneous breathing and respiratory function stable Cardiovascular status: stable Anesthetic complications: no    Last Vitals:  Vitals:   09/27/16 1045 09/27/16 1056  BP: 113/72 134/71  Pulse: 71 76  Resp: 19 18  Temp: 36.7 C 36.7 C    Last Pain:  Vitals:   09/27/16 1104  TempSrc:   PainSc: [place]

## 2016-09-28 ENCOUNTER — Encounter (HOSPITAL_COMMUNITY): Payer: Self-pay | Admitting: Orthopedic Surgery

## 2016-09-28 LAB — CBC
HCT: 29.5 % — ABNORMAL LOW (ref 36.0–46.0)
Hemoglobin: 9 g/dL — ABNORMAL LOW (ref 12.0–15.0)
MCH: 23.2 pg — ABNORMAL LOW (ref 26.0–34.0)
MCHC: 30.5 g/dL (ref 30.0–36.0)
MCV: 76 fL — ABNORMAL LOW (ref 78.0–100.0)
Platelets: 282 10*3/uL (ref 150–400)
RBC: 3.88 MIL/uL (ref 3.87–5.11)
RDW: 16.4 % — ABNORMAL HIGH (ref 11.5–15.5)
WBC: 13.5 10*3/uL — ABNORMAL HIGH (ref 4.0–10.5)

## 2016-09-28 LAB — BASIC METABOLIC PANEL
Anion gap: 7 (ref 5–15)
BUN: 12 mg/dL (ref 6–20)
CO2: 29 mmol/L (ref 22–32)
Calcium: 8.3 mg/dL — ABNORMAL LOW (ref 8.9–10.3)
Chloride: 103 mmol/L (ref 101–111)
Creatinine, Ser: 0.72 mg/dL (ref 0.44–1.00)
GFR calc Af Amer: >60 mL/min (ref >60)
GFR calc non Af Amer: >60 mL/min (ref >60)
Glucose, Bld: 152 mg/dL — ABNORMAL HIGH (ref 65–99)
Potassium: 3 mmol/L — ABNORMAL LOW (ref 3.5–5.1)
Sodium: 139 mmol/L (ref 135–145)

## 2016-09-28 MED ORDER — POTASSIUM CHLORIDE CRYS ER 20 MEQ PO TBCR
40.0000 meq | EXTENDED_RELEASE_TABLET | ORAL | Status: AC
  Administered 2016-09-28 (×2): 40 meq via ORAL
  Filled 2016-09-28 (×3): qty 2

## 2016-09-28 MED ORDER — POLYSACCHARIDE IRON COMPLEX 150 MG PO CAPS
150.0000 mg | ORAL_CAPSULE | Freq: Every day | ORAL | Status: DC
  Administered 2016-09-28: 150 mg via ORAL
  Filled 2016-09-28: qty 1

## 2016-09-28 MED ORDER — SODIUM CHLORIDE 0.9 % IV BOLUS (SEPSIS)
250.0000 mL | Freq: Once | INTRAVENOUS | Status: AC
  Administered 2016-09-28: 250 mL via INTRAVENOUS

## 2016-09-28 NOTE — Progress Notes (Signed)
Physical Therapy Treatment Patient Details Name: Victoria Mosley MRN: [id number] DOB: 03-15-1951 Today's Date: 09/28/2016    History of Present Illness Pt admitted for L anterior THA.  Pt has PMH of R TKR and R posterior THA.l    PT Comments    Good progress with mobility and pt hopeful for dc home tomorrow.  Follow Up Recommendations  Home health PT     Equipment Recommendations  Rolling walker with 5" wheels    Recommendations for Other Services OT consult     Precautions / Restrictions Precautions Precautions: Anterior Hip;Fall Restrictions Weight Bearing Restrictions: No Other Position/Activity Restrictions: WBAT    Mobility  Bed Mobility Overal bed mobility: Needs Assistance Bed Mobility: Supine to Sit;Sit to Supine     Supine to sit: Min assist Sit to supine: Min assist   General bed mobility comments: cues for sequence and use of R LE to self assist  Transfers Overall transfer level: Needs assistance Equipment used: Rolling walker (2 wheeled) Transfers: Sit to/from Stand Sit to Stand: Min assist;From elevated surface         General transfer comment: cues for LE mangement and use of UEs to self assist.  Physical assist to bring wt up and fwd  Ambulation/Gait Ambulation/Gait assistance: Min assist Ambulation Distance (Feet): 100 Feet Assistive device: Rolling walker (2 wheeled) Gait Pattern/deviations: Step-to pattern;Step-through pattern;Decreased step length - right;Decreased step length - left;Shuffle;Trunk flexed Gait velocity: decr Gait velocity interpretation: Below normal speed for age/gender General Gait Details: Increased time and cues for sequence, posture and position from [employer]            Wheelchair Mobility    Modified Rankin (Stroke Patients Only)       Balance Overall balance assessment: Needs assistance Sitting-balance support: Feet supported Sitting balance-Leahy Scale: Fair     Standing balance support:  Bilateral upper extremity supported Standing balance-Leahy Scale: Poor                      Cognition Arousal/Alertness: Awake/alert Behavior During Therapy: WFL for tasks assessed/performed Overall Cognitive Status: Within Functional Limits for tasks assessed                      Exercises Total Joint Exercises Ankle Circles/Pumps: AROM;Both;15 reps;Supine Quad Sets: AROM;Both;10 reps;Supine Heel Slides: AAROM;Left;15 reps;Supine Hip ABduction/ADduction: AAROM;Left;15 reps;Supine    General Comments        Pertinent Vitals/Pain Pain Assessment: 0-10 Pain Score: 5  Pain Location: L hip with mobility Pain Descriptors / Indicators: Aching;Sore Pain Intervention(s): Limited activity within patient's tolerance;Monitored during session;Premedicated before session;Ice applied    Home Living Family/patient expects to be discharged to:: Private residence Living Arrangements: Spouse/significant other Available Help at Discharge: Family;Available 24 hours/day Type of Home: House Home Access: Stairs to enter Entrance Stairs-Rails: None Home Layout: One level Home Equipment: Bedside commode;Tub bench;Hand held shower head Additional Comments: Pt familiar with use of all equipment from past surgeries.    Prior Function Level of Independence: Needs assistance    ADL's / Homemaking Assistance Needed: Husband did most of the homemaking and assisted with donning L sock and shoe.     PT Goals (current goals can now be found in the care plan section) Acute Rehab PT Goals Patient Stated Goal: to not have this pain PT Goal Formulation: With patient Time For Goal Achievement: 10/02/16 Potential to Achieve Goals: Good Progress towards PT goals: Progressing toward goals  Frequency    7X/week      PT Plan Current plan remains appropriate    Co-evaluation             End of Session Equipment Utilized During Treatment: Gait belt Activity Tolerance: Patient  tolerated treatment well Patient left: in bed;with call bell/phone within reach     Time: 1437-1516 PT Time Calculation (min) (ACUTE ONLY): 39 min  Charges:  $Gait Training: 23-37 mins $Therapeutic Exercise: 8-22 mins $Therapeutic Activity: 8-22 mins                    G Codes:      Victoria Mosley 06-Oct-2016, 3:37 PM

## 2016-09-28 NOTE — Care Management Note (Signed)
Case Management Note  Patient Details  Name: Lataisha Colan Castiglia MRN: [id number] Date of Birth: [birth year]  Subjective/Objective:                  Left total hip arthroplasty, anterior approach Action/Plan: Discharge planning Expected Discharge Date:  09/29/16              Expected Discharge Plan:  [place]  In-House Referral:     Discharge planning Services  CM Consult  Post Acute Care Choice:  Home Health Choice offered to:  Patient  DME Arranged:  Walker rolling DME Agency:  [place]:  PT [place] Agency:  [facility] ([facility]  Status of Service:  Completed, signed off  If discussed at [employer] of Stay Meetings, dates discussed:    Additional Comments: CM met with pt in room to offer choice of home health agency.  Pt chooses [facility] to render HHPT.  Referral given to [facility] rep, Tim.  CM notified [place] DME rep, Larene Beach to please deliver the rolling walker to room prior to discharge.  NO other CM needs were communicated. Dellie Catholic, RN 09/28/2016, 3:11 PM

## 2016-09-28 NOTE — Evaluation (Signed)
Occupational Therapy Evaluation Patient Details Name: Victoria Mosley MRN: [id number] DOB: 1951-05-09 Today's Date: 09/28/2016    History of Present Illness Pt admitted for R anterior THA.  Pt has PMH of R TKR and R posterior THA.l   Clinical Impression   Pt admitted for the above diagnosis and has the deficits outlined below. Pt would benefit from cont OT to increase independence with adl transfers so she can safely d/c home with her husband who is available at all times. Pt with low BP this am.  Upon sitting on EOB BP was 99/52 and once in chair 116/50.  Will continue to follow for transfers. No post acute OT needed. Pt is very familiar with adl routine from other joint replacements.    Follow Up Recommendations  No OT follow up;Supervision/Assistance - 24 hour    Equipment Recommendations  None recommended by OT    Recommendations for Other Services       Precautions / Restrictions Precautions Precautions: Anterior Hip;Fall Restrictions Weight Bearing Restrictions: No      Mobility Bed Mobility Overal bed mobility: Needs Assistance Bed Mobility: Supine to Sit     Supine to sit: Min assist     General bed mobility comments: assist to get hips square and come to full sitting  Transfers Overall transfer level: Needs assistance Equipment used: Rolling walker (2 wheeled) Transfers: Sit to/from [facility] Sit to Stand: Min assist Stand pivot transfers: Min assist       General transfer comment: assist needed to power up and transfer L hand onto walker.    Balance Overall balance assessment: Needs assistance Sitting-balance support: Feet supported Sitting balance-Leahy Scale: Fair     Standing balance support: Bilateral upper extremity supported;During functional activity Standing balance-Leahy Scale: Poor Standing balance comment: Pt heavily reliant on walker and outside assist when standing.                            ADL  Overall ADL's : Needs assistance/impaired Eating/Feeding: Independent;Sitting   Grooming: Set up;Sitting   Upper Body Bathing: Set up;Sitting   Lower Body Bathing: Moderate assistance;Sit to/from stand;Cueing for safety   Upper Body Dressing : Set up;Sitting   Lower Body Dressing: Moderate assistance;Sit to/from stand;Cueing for compensatory techniques Lower Body Dressing Details (indicate cue type and reason): has reacher and sock aid at home and husband available to assist as needed. Toilet Transfer: BSC;Minimal assistance;Stand-pivot;Cueing for Armed forces operational officer Details (indicate cue type and reason): Pivot to BSC due to low BP Toileting- Clothing Manipulation and Hygiene: Moderate assistance;Sit to/from stand Toileting - Clothing Manipulation Details (indicate cue type and reason): Pt feeling mildly dizzy when standing. Therapist managed clothing while pt held to walker. Tub/ Banker:  (discussed but pt declined practice today )   Functional mobility during ADLs: Minimal assistance;Rolling walker (only for transfer) General ADL Comments: Pt anxious during therapy and low BP therefore not tolerating a lot of adl practice.  Educated pt at lenght on techniques. Pt is familiar from other joint replacements.       Vision     Perception     Praxis      Pertinent Vitals/Pain Pain Assessment: 0-10 Pain Score: 8  Pain Location: L hip with mobility Pain Descriptors / Indicators: Aching;Operative site guarding Pain Intervention(s): Limited activity within patient's tolerance;Monitored during session;Premedicated before session;Repositioned;Relaxation     Hand Dominance Right   Extremity/Trunk Assessment Upper Extremity Assessment Upper Extremity  Assessment: Overall WFL for tasks assessed   Lower Extremity Assessment Lower Extremity Assessment: Defer to PT evaluation   Cervical / Trunk Assessment Cervical / Trunk Assessment: Other exceptions Cervical / Trunk  Exceptions: complains of back pain and ruptured disk in back.   Communication Communication Communication: No difficulties   Cognition Arousal/Alertness: Awake/alert Behavior During Therapy: WFL for tasks assessed/performed Overall Cognitive Status: Within Functional Limits for tasks assessed                     General Comments       Exercises       Shoulder Instructions      Home Living Family/patient expects to be discharged to:: Private residence Living Arrangements: Spouse/significant other Available Help at Discharge: Family;Available 24 hours/day Type of Home: House Home Access: Stairs to enter [employer] of Steps: 1 Entrance Stairs-Rails: None Home Layout: One level     Bathroom Shower/Tub: Tub/shower unit;Curtain Shower/tub characteristics: Architectural technologist: Standard     Home Equipment: Environmental consultant - 2 wheels;Bedside commode;Tub bench;Hand held shower head   Additional Comments: Pt familiar with use of all equipment from past surgeries.      Prior Functioning/Environment Level of Independence: Needs assistance    ADL's / Homemaking Assistance Needed: Husband did most of the homemaking and assisted with donning L sock and shoe.            OT Problem List: Impaired balance (sitting and/or standing);Decreased knowledge of use of DME or AE;Pain   OT Treatment/Interventions: Self-care/ADL training;DME and/or AE instruction    OT Goals(Current goals can be found in the care plan section) Acute Rehab OT Goals Patient Stated Goal: to not have this pain OT Goal Formulation: With patient/family Time For Goal Achievement: 10/05/16 Potential to Achieve Goals: Good ADL Goals Pt Will Perform Tub/Shower Transfer: Tub transfer;rolling walker;ambulating;tub bench;with min assist Additional ADL Goal #1: Pt will walk to bathroom and toilet on 3:1 over commode all with S.  OT Frequency: Min 2X/week   Barriers to D/C:             Co-evaluation              End of Session Equipment Utilized During Treatment: Surveyor, mining Communication: Mobility status  Activity Tolerance: Patient limited by pain Patient left: in chair;with call bell/phone within reach;Other (comment) (PT in room)   Time: 1030-1057 OT Time Calculation (min): 27 min Charges:  OT General Charges $OT Visit: 1 Procedure OT Evaluation $OT Eval Moderate Complexity: 1 Procedure OT Treatments $Self Care/Home Management : 8-22 mins G-Codes:    Glenford Peers Oct 26, 2016, 11:15 AM  [phone]

## 2016-09-28 NOTE — Progress Notes (Signed)
Subjective: 1 Day Post-Op Procedure(s) (LRB): LEFT TOTAL HIP ARTHROPLASTY ANTERIOR APPROACH (Left) Patient reports pain as mild and moderate.   Patient seen in rounds by Dr. Wynelle Link. Patient is well, but has had some minor complaints of pain in the hip, requiring pain medications We will start therapy today.  Plan is to go Home after hospital stay.  Objective: Vital signs in last 24 hours: Temp:  [97.6 F (36.4 C)-99.5 F (37.5 C)] 98.7 F (37.1 C) (11/30 0624) Pulse Rate:  [61-86] 67 (11/30 0624) Resp:  [11-20] 19 (11/30 0624) BP: (92-141)/(46-86) 92/46 (11/30 0624) SpO2:  [92 %-100 %] 99 % (11/30 0624)  Intake/Output from previous day:  Intake/Output Summary (Last 24 hours) at 09/28/16 0821 Last data filed at 09/28/16 0624  Gross per 24 hour  Intake          3756.25 ml  Output             2505 ml  Net          1251.25 ml    Intake/Output this shift: No intake/output data recorded.  Labs:  Recent Labs  09/28/16 0422  HGB 9.0*    Recent Labs  09/28/16 0422  WBC 13.5*  RBC 3.88  HCT 29.5*  PLT 282    Recent Labs  09/28/16 0422  NA 139  K 3.0*  CL 103  CO2 29  BUN 12  CREATININE 0.72  GLUCOSE 152*  CALCIUM 8.3*   No results for input(s): LABPT, INR in the last 72 hours.  EXAM General - Patient is Alert and Appropriate Extremity - Neurovascular intact Sensation intact distally Dorsiflexion/Plantar flexion intact Dressing - dressing C/D/I Motor Function - intact, moving foot and toes well on exam.  Hemovac pulled without difficulty.  Past Medical History:  Diagnosis Date  . 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 Arthritis   . Bilateral knee pain    sees Dr. Wynelle Link   . Bilateral renal cysts   . Chronic UTI (urinary tract infection)   . Complication of anesthesia   . Cough 11/30/14    cough since 11/20/14- now productine- green with green mucus out of nose- no fever  . Diverticulitis   . GERD (gastroesophageal reflux  disease)   . History of kidney stones   . History of melanoma excision   . History of renal calculi   . 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 Colposcopy inadequate/normal 04/2015  . Hyperlipidemia   . IBS (irritable bowel syndrome)   . IBS (irritable bowel syndrome)   . Kidney stones    bilateral  . Menopause    sees Dr. Phineas Real  . Migraines   . Nephrolithiasis    left  . Neurogenic bladder   . Normal cardiac stress test 2012  . PONV (postoperative nausea and vomiting)   . Tendonitis    right ankle  . Urinary urgency   . UTI (urinary tract infection)    started atb on 03-24-12    Assessment/Plan: 1 Day Post-Op Procedure(s) (LRB): LEFT TOTAL HIP ARTHROPLASTY ANTERIOR APPROACH (Left) Principal Problem:   OA (osteoarthritis) of hip  Estimated body mass index is 32.58 kg/m as calculated from the following:   Height as of this encounter: 5\' 7"  (1.702 m).   Weight as of this encounter: 94.3 kg (208 lb). Up with therapy Plan for discharge tomorrow Discharge home with home health  DVT Prophylaxis -  Xarelto Weight Bearing As Tolerated left Leg Hemovac Pulled Begin Therapy  Arlee Muslim, PA-C Orthopaedic Surgery 09/28/2016, 8:21 AM

## 2016-09-28 NOTE — Evaluation (Signed)
Physical Therapy Evaluation Patient Details Name: Victoria Mosley MRN: [id number] DOB: September 28, 1951 Today's Date: 09/28/2016   History of Present Illness  Pt admitted for L anterior THA.  Pt has PMH of R TKR and R posterior THA.l  Clinical Impression  Pt s/p L THR presents with decreased L LE strength/ROM and post op pain limiting functional mobility.  Pt should progress to dc home with family assist and HHPT follow up.    Follow Up Recommendations Home health PT    Equipment Recommendations  Rolling walker with 5" wheels    Recommendations for Other Services OT consult     Precautions / Restrictions Precautions Precautions: Anterior Hip;Fall Restrictions Weight Bearing Restrictions: No      Mobility  Bed Mobility               General bed mobility comments: OOB with OT  Transfers Overall transfer level: Needs assistance Equipment used: Rolling walker (2 wheeled) Transfers: Sit to/from Stand Sit to Stand: Min assist;Mod assist         General transfer comment: cues for LE mangement and use of UEs to self assist.  Physical assist to bring wt up and fwd  Ambulation/Gait Ambulation/Gait assistance: Min assist;+2 safety/equipment Ambulation Distance (Feet): 50 Feet Assistive device: Rolling walker (2 wheeled) Gait Pattern/deviations: Step-to pattern;Decreased step length - right;Decreased step length - left;Shuffle;Trunk flexed Gait velocity: decr Gait velocity interpretation: Below normal speed for age/gender General Gait Details: Increased time and cues for sequence, posture and position from [employer]            Wheelchair Mobility    Modified Rankin (Stroke Patients Only)       Balance Overall balance assessment: Needs assistance Sitting-balance support: Feet supported Sitting balance-Leahy Scale: Fair     Standing balance support: Bilateral upper extremity supported Standing balance-Leahy Scale: Poor                               Pertinent Vitals/Pain Pain Assessment: 0-10 Pain Score: 8  Pain Location: L hip with mobility Pain Descriptors / Indicators: Aching;Sore Pain Intervention(s): Limited activity within patient's tolerance;Monitored during session;Premedicated before session;Ice applied    Home Living Family/patient expects to be discharged to:: Private residence Living Arrangements: Spouse/significant other Available Help at Discharge: Family;Available 24 hours/day Type of Home: House Home Access: Stairs to enter Entrance Stairs-Rails: None Entrance Stairs-Number of Steps: 1 Home Layout: One level Home Equipment: Bedside commode;Tub bench;Hand held shower head Additional Comments: Pt familiar with use of all equipment from past surgeries.    Prior Function Level of Independence: Needs assistance      ADL's / Homemaking Assistance Needed: Husband did most of the homemaking and assisted with donning L sock and shoe.        Hand Dominance   Dominant Hand: Right    Extremity/Trunk Assessment   Upper Extremity Assessment: Overall WFL for tasks assessed           Lower Extremity Assessment: LLE deficits/detail   LLE Deficits / Details: Strength at L hip 2/5 with AAROM at hip to 75 flex and 15 abd     Communication   Communication: No difficulties  Cognition Arousal/Alertness: Awake/alert Behavior During Therapy: WFL for tasks assessed/performed Overall Cognitive Status: Within Functional Limits for tasks assessed                      General Comments  Exercises Total Joint Exercises Ankle Circles/Pumps: AROM;Both;15 reps;Supine Quad Sets: AROM;Both;10 reps;Supine Heel Slides: AAROM;Left;15 reps;Supine Hip ABduction/ADduction: AAROM;Left;15 reps;Supine   Assessment/Plan    PT Assessment Patient needs continued PT services  PT Problem List Decreased strength;Decreased range of motion;Decreased activity tolerance;Decreased mobility;Decreased balance;Decreased  knowledge of use of DME;Pain          PT Treatment Interventions DME instruction;Gait training;Stair training;Functional mobility training;Therapeutic activities;Therapeutic exercise;Patient/family education    PT Goals (Current goals can be found in the Care Plan section)  Acute Rehab PT Goals Patient Stated Goal: to not have this pain PT Goal Formulation: With patient Time For Goal Achievement: 10/02/16 Potential to Achieve Goals: Good    Frequency 7X/week   Barriers to discharge        Co-evaluation               End of Session Equipment Utilized During Treatment: Gait belt Activity Tolerance: Patient tolerated treatment well;Patient limited by fatigue Patient left: in chair;with call bell/phone within reach;with family/visitor present Nurse Communication: Mobility status         Time: [id number] PT Time Calculation (min) (ACUTE ONLY): 40 min   Charges:   PT Evaluation $PT Eval Low Complexity: 1 Procedure PT Treatments $Gait Training: 8-22 mins $Therapeutic Exercise: 8-22 mins   PT G Codes:        Makynzee Tigges 2016/10/15, 3:32 PM

## 2016-09-28 NOTE — Discharge Instructions (Addendum)
° °Dr. Frank Aluisio °Total Joint Specialist °[facility] °[address] °[place], [place] [address] °[phone] ° °ANTERIOR APPROACH TOTAL HIP REPLACEMENT POSTOPERATIVE DIRECTIONS ° ° °Hip Rehabilitation, Guidelines Following Surgery  °The results of a hip operation are greatly improved after range of motion and muscle strengthening exercises. Follow all safety measures which are given to protect your hip. If any of these exercises cause increased pain or swelling in your joint, decrease the amount until you are comfortable again. Then slowly increase the exercises. Call your caregiver if you have problems or questions.  ° °HOME CARE INSTRUCTIONS  °Remove items at home which could result in a fall. This includes throw rugs or furniture in walking pathways.  °· ICE to the affected hip every three hours for 30 minutes at a time and then as needed for pain and swelling.  Continue to use ice on the hip for pain and swelling from surgery. You may notice swelling that will progress down to the foot and ankle.  This is normal after surgery.  Elevate the leg when you are not up walking on it.   °· Continue to use the breathing machine which will help keep your temperature down.  It is common for your temperature to cycle up and down following surgery, especially at night when you are not up moving around and exerting yourself.  The breathing machine keeps your lungs expanded and your temperature down. ° ° °DIET °You may resume your previous home diet once your are discharged from the hospital. ° °DRESSING / WOUND CARE / SHOWERING °You may shower 3 days after surgery, but keep the wounds dry during showering.  You may use an occlusive plastic wrap (Press'n Seal for example), NO SOAKING/SUBMERGING IN THE BATHTUB.  If the bandage gets wet, change with a clean dry gauze.  If the incision gets wet, pat the wound dry with a clean towel. °You may start showering once you are discharged home but do not  submerge the incision under water. Just pat the incision dry and apply a dry gauze dressing on daily. °Change the surgical dressing daily and reapply a dry dressing each time. ° °ACTIVITY °Walk with your walker as instructed. °Use walker as long as suggested by your caregivers. °Avoid periods of inactivity such as sitting longer than an hour when not asleep. This helps prevent blood clots.  °You may resume a sexual relationship in one month or when given the OK by your doctor.  °You may return to work once you are cleared by your doctor.  °Do not drive a car for 6 weeks or until released by you surgeon.  °Do not drive while taking narcotics. ° °WEIGHT BEARING °Weight bearing as tolerated with assist device (walker, cane, etc) as directed, use it as long as suggested by your surgeon or therapist, typically at least 4-6 weeks. ° °POSTOPERATIVE CONSTIPATION PROTOCOL °Constipation - defined medically as fewer than three stools per week and severe constipation as less than one stool per week. ° °One of the most common issues patients have following surgery is constipation.  Even if you have a regular bowel pattern at home, your normal regimen is likely to be disrupted due to multiple reasons following surgery.  Combination of anesthesia, postoperative narcotics, change in appetite and fluid intake all can affect your bowels.  In order to avoid complications following surgery, here are some recommendations in order to help you during your recovery period. ° °Colace (docusate) - Pick up an over-the-counter   form of Colace or another stool softener and take twice a day as long as you are requiring postoperative pain medications.  Take with a full glass of water daily.  If you experience loose stools or diarrhea, hold the colace until you stool forms back up.  If your symptoms do not get better within 1 week or if they get worse, check with your doctor. ° °Dulcolax (bisacodyl) - Pick up over-the-counter and take as directed  by the product packaging as needed to assist with the movement of your bowels.  Take with a full glass of water.  Use this product as needed if not relieved by Colace only.  ° °MiraLax (polyethylene glycol) - Pick up over-the-counter to have on hand.  MiraLax is a solution that will increase the amount of water in your bowels to assist with bowel movements.  Take as directed and can mix with a glass of water, juice, soda, coffee, or tea.  Take if you go more than two days without a movement. °Do not use MiraLax more than once per day. Call your doctor if you are still constipated or irregular after using this medication for 7 days in a row. ° °If you continue to have problems with postoperative constipation, please contact the office for further assistance and recommendations.  If you experience "the worst abdominal pain ever" or develop nausea or vomiting, please contact the office immediatly for further recommendations for treatment. ° °ITCHING ° If you experience itching with your medications, try taking only a single pain pill, or even half a pain pill at a time.  You can also use Benadryl over the counter for itching or also to help with sleep.  ° °TED HOSE STOCKINGS °Wear the elastic stockings on both legs for three weeks following surgery during the day but you may remove then at night for sleeping. ° °MEDICATIONS °See your medication summary on the “After Visit Summary” that the nursing staff will review with you prior to discharge.  You may have some home medications which will be placed on hold until you complete the course of blood thinner medication.  It is important for you to complete the blood thinner medication as prescribed by your surgeon.  Continue your approved medications as instructed at time of discharge. ° °PRECAUTIONS °If you experience chest pain or shortness of breath - call 911 immediately for transfer to the hospital emergency department.  °If you develop a fever greater that 101 F,  purulent drainage from wound, increased redness or drainage from wound, foul odor from the wound/dressing, or calf pain - CONTACT YOUR SURGEON.   °                                                °FOLLOW-UP APPOINTMENTS °Make sure you keep all of your appointments after your operation with your surgeon and caregivers. You should call the office at the above phone number and make an appointment for approximately two weeks after the date of your surgery or on the date instructed by your surgeon outlined in the "After Visit Summary". ° °RANGE OF MOTION AND STRENGTHENING EXERCISES  °These exercises are designed to help you keep full movement of your hip joint. Follow your caregiver's or physical therapist's instructions. Perform all exercises about fifteen times, three times per day or as directed. Exercise both hips, even if you   have had only one joint replacement. These exercises can be done on a training (exercise) mat, on the floor, on a table or on a bed. Use whatever works the best and is most comfortable for you. Use music or television while you are exercising so that the exercises are a pleasant break in your day. This will make your life better with the exercises acting as a break in routine you can look forward to.  Lying on your back, slowly slide your foot toward your buttocks, raising your knee up off the floor. Then slowly slide your foot back down until your leg is straight again.  Lying on your back spread your legs as far apart as you can without causing discomfort.  Lying on your side, raise your upper leg and foot straight up from the floor as far as is comfortable. Slowly lower the leg and repeat.  Lying on your back, tighten up the muscle in the front of your thigh (quadriceps muscles). You can do this by keeping your leg straight and trying to raise your heel off the floor. This helps strengthen the largest muscle supporting your knee.  Lying on your back, tighten up the muscles of your  buttocks both with the legs straight and with the knee bent at a comfortable angle while keeping your heel on the floor.   IF YOU ARE TRANSFERRED TO A SKILLED REHAB FACILITY If the patient is transferred to a skilled rehab facility following release from the hospital, a list of the current medications will be sent to the facility for the patient to continue.  When discharged from the skilled rehab facility, please have the facility set up the patient's [place] prior to being released. Also, the skilled facility will be responsible for providing the patient with their medications at time of release from the facility to include their pain medication, the muscle relaxants, and their blood thinner medication. If the patient is still at the rehab facility at time of the two week follow up appointment, the skilled rehab facility will also need to assist the patient in arranging follow up appointment in our office and any transportation needs.  MAKE SURE YOU:  Understand these instructions.  Get help right away if you are not doing well or get worse.    Pick up stool softner and laxative for home use following surgery while on pain medications. Do not submerge incision under water. Please use good hand washing techniques while changing dressing each day. May shower starting three days after surgery. Please use a clean towel to pat the incision dry following showers. Continue to use ice for pain and swelling after surgery. Do not use any lotions or creams on the incision until instructed by your surgeon.  Take Xarelto for two and a half more weeks, then discontinue Xarelto. Once the patient has completed the blood thinner regimen, then take a Baby 81 mg Aspirin daily for three more weeks.    Information on my medicine - XARELTO (Rivaroxaban)  This medication education was reviewed with me or my healthcare representative as part of my discharge preparation.    Why was Xarelto  prescribed for you? Xarelto was prescribed for you to reduce the risk of blood clots forming after orthopedic surgery. The medical term for these abnormal blood clots is venous thromboembolism (VTE).  What do you need to know about xarelto ? Take your Xarelto ONCE DAILY at the same time every day. You may take it either with  or without food.  If you have difficulty swallowing the tablet whole, you may crush it and mix in applesauce just prior to taking your dose.  Take Xarelto exactly as prescribed by your doctor and DO NOT stop taking Xarelto without talking to the doctor who prescribed the medication.  Stopping without other VTE prevention medication to take the place of Xarelto may increase your risk of developing a clot.  After discharge, you should have regular check-up appointments with your healthcare provider that is prescribing your Xarelto.    What do you do if you miss a dose? If you miss a dose, take it as soon as you remember on the same day then continue your regularly scheduled once daily regimen the next day. Do not take two doses of Xarelto on the same day.   Important Safety Information A possible side effect of Xarelto is bleeding. You should call your healthcare provider right away if you experience any of the following: ? Bleeding from an injury or your nose that does not stop. ? Unusual colored urine (red or dark brown) or unusual colored stools (red or black). ? Unusual bruising for unknown reasons. ? A serious fall or if you hit your head (even if there is no bleeding).  Some medicines may interact with Xarelto and might increase your risk of bleeding while on Xarelto. To help avoid this, consult your healthcare provider or pharmacist prior to using any new prescription or non-prescription medications, including herbals, vitamins, non-steroidal anti-inflammatory drugs (NSAIDs) and supplements.  This website has more information on Xarelto:  [email or web address]

## 2016-09-29 LAB — BASIC METABOLIC PANEL
Anion gap: 9 (ref 5–15)
BUN: 13 mg/dL (ref 6–20)
CO2: 31 mmol/L (ref 22–32)
Calcium: 8.5 mg/dL — ABNORMAL LOW (ref 8.9–10.3)
Chloride: 98 mmol/L — ABNORMAL LOW (ref 101–111)
Creatinine, Ser: 0.61 mg/dL (ref 0.44–1.00)
GFR calc Af Amer: >60 mL/min (ref >60)
GFR calc non Af Amer: >60 mL/min (ref >60)
Glucose, Bld: 134 mg/dL — ABNORMAL HIGH (ref 65–99)
Potassium: 3.5 mmol/L (ref 3.5–5.1)
Sodium: 138 mmol/L (ref 135–145)

## 2016-09-29 LAB — CBC
HCT: 29 % — ABNORMAL LOW (ref 36.0–46.0)
Hemoglobin: 8.9 g/dL — ABNORMAL LOW (ref 12.0–15.0)
MCH: 23.4 pg — ABNORMAL LOW (ref 26.0–34.0)
MCHC: 30.7 g/dL (ref 30.0–36.0)
MCV: 76.1 fL — ABNORMAL LOW (ref 78.0–100.0)
Platelets: 252 10*3/uL (ref 150–400)
RBC: 3.81 MIL/uL — ABNORMAL LOW (ref 3.87–5.11)
RDW: 16.3 % — ABNORMAL HIGH (ref 11.5–15.5)
WBC: 14.8 10*3/uL — ABNORMAL HIGH (ref 4.0–10.5)

## 2016-09-29 MED ORDER — POLYSACCHARIDE IRON COMPLEX 150 MG PO CAPS
150.0000 mg | ORAL_CAPSULE | Freq: Two times a day (BID) | ORAL | Status: DC
  Administered 2016-09-29: 150 mg via ORAL
  Filled 2016-09-29: qty 1

## 2016-09-29 MED ORDER — TRAMADOL HCL 50 MG PO TABS: 50.0000 mg | ORAL_TABLET | Freq: Four times a day (QID) | ORAL | 1 refills | Status: DC | PRN

## 2016-09-29 MED ORDER — HYDROMORPHONE HCL 2 MG PO TABS: 2.0000 mg | ORAL_TABLET | ORAL | 0 refills | Status: DC | PRN

## 2016-09-29 MED ORDER — POLYSACCHARIDE IRON COMPLEX 150 MG PO CAPS: 150.0000 mg | ORAL_CAPSULE | Freq: Two times a day (BID) | ORAL | 0 refills | Status: DC

## 2016-09-29 MED ORDER — RIVAROXABAN 10 MG PO TABS: 10.0000 mg | ORAL_TABLET | Freq: Every day | ORAL | 0 refills | Status: DC

## 2016-09-29 NOTE — Progress Notes (Signed)
Physical Therapy Treatment Patient Details Name: Victoria Mosley MRN: [id number] DOB: [birth year] Today's Date: 09/29/2016    History of Present Illness Pt admitted for L anterior THA.  Pt has PMH of R TKR and R posterior THA.l    PT Comments    Pt progressing well with mobility and eager for return home.  Reviewed therex, stairs and car transfers  Follow Up Recommendations  Home health PT     Equipment Recommendations  Rolling walker with 5" wheels    Recommendations for Other Services OT consult     Precautions / Restrictions Precautions Precautions: Fall Restrictions Weight Bearing Restrictions: No Other Position/Activity Restrictions: WBAT    Mobility  Bed Mobility Overal bed mobility: Needs Assistance Bed Mobility: Supine to Sit;Sit to Supine     Supine to sit: Min guard Sit to supine: Min assist   General bed mobility comments: cues for sequence and use of R LE to self assist  Transfers Overall transfer level: Needs assistance Equipment used: Rolling walker (2 wheeled) Transfers: Sit to/from Stand Sit to Stand: Min guard;From elevated surface         General transfer comment: min cues for LE management and use of UEs to self assist.  To/from EOB and BSC  Ambulation/Gait Ambulation/Gait assistance: Min guard;Supervision Ambulation Distance (Feet): 147 Feet Assistive device: Rolling walker (2 wheeled) Gait Pattern/deviations: Step-to pattern;Step-through pattern;Decreased step length - right;Decreased step length - left;Shuffle;Trunk flexed Gait velocity: decr Gait velocity interpretation: Below normal speed for age/gender General Gait Details: Increased time and cues for sequence, posture and position from RW   Stairs Stairs: Yes Stairs assistance: Min assist Stair Management: No rails;Step to pattern;Forwards;With walker Number of Stairs: 2 General stair comments: single step twice with RW and cues for sequence and foot/RW  placement  Wheelchair Mobility    Modified Rankin (Stroke Patients Only)       Balance                                    Cognition Arousal/Alertness: Awake/alert Behavior During Therapy: WFL for tasks assessed/performed Overall Cognitive Status: Within Functional Limits for tasks assessed                      Exercises Total Joint Exercises Ankle Circles/Pumps: AROM;Both;15 reps;Supine Quad Sets: AROM;Both;10 reps;Supine Heel Slides: AAROM;Left;15 reps;Supine Hip ABduction/ADduction: AAROM;Left;15 reps;Supine    General Comments        Pertinent Vitals/Pain Pain Assessment: 0-10 Pain Score: 4  Pain Location: L hip Pain Descriptors / Indicators: Aching;Sore Pain Intervention(s): Limited activity within patient's tolerance;Monitored during session;Premedicated before session;Ice applied    Home Living                      Prior Function            PT Goals (current goals can now be found in the care plan section) Acute Rehab PT Goals Patient Stated Goal: home PT Goal Formulation: With patient Time For Goal Achievement: 10/02/16 Potential to Achieve Goals: Good Progress towards PT goals: Progressing toward goals    Frequency    7X/week      PT Plan Current plan remains appropriate    Co-evaluation             End of Session Equipment Utilized During Treatment: Gait belt Activity Tolerance: Patient tolerated treatment well Patient left: in bed;with call bell/phone  within reach     Time: 0907-0955 PT Time Calculation (min) (ACUTE ONLY): 48 min  Charges:  $Gait Training: 8-22 mins $Therapeutic Exercise: 8-22 mins $Therapeutic Activity: 8-22 mins                    G Codes:      Victoria Mosley 10/07/2016, 12:41 PM

## 2016-09-29 NOTE — Discharge Summary (Signed)
Physician Discharge Summary   Patient ID: Victoria Mosley MRN: [id number] DOB/AGE: 05-20-1951 65 y.o.  Admit date: 09/27/2016 Discharge date: 09-29-2016  Primary Diagnosis:  Osteoarthritis of the Left hip.   Admission Diagnoses:  Past Medical History:  Diagnosis Date  . 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 Arthritis   . Bilateral knee pain    sees Dr. Wynelle Link   . Bilateral renal cysts   . Chronic UTI (urinary tract infection)   . Complication of anesthesia   . Cough 11/30/14    cough since 11/20/14- now productine- green with green mucus out of nose- no fever  . Diverticulitis   . GERD (gastroesophageal reflux disease)   . History of kidney stones   . History of melanoma excision   . History of renal calculi   . 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 Colposcopy inadequate/normal 04/2015  . Hyperlipidemia   . IBS (irritable bowel syndrome)   . IBS (irritable bowel syndrome)   . Kidney stones    bilateral  . Menopause    sees Dr. Phineas Real  . Migraines   . Nephrolithiasis    left  . Neurogenic bladder   . Normal cardiac stress test 2012  . PONV (postoperative nausea and vomiting)   . Tendonitis    right ankle  . Urinary urgency   . UTI (urinary tract infection)    started atb on 03-24-12   Discharge Diagnoses:   Principal Problem:   OA (osteoarthritis) of hip  Estimated body mass index is 32.58 kg/m as calculated from the following:   Height as of this encounter: 5' 7"  (1.702 m).   Weight as of this encounter: 94.3 kg (208 lb).  Procedure(s) (LRB): LEFT TOTAL HIP ARTHROPLASTY ANTERIOR APPROACH (Left)   Consults: None  HPI: Victoria Mosley is a 65 y.o. female who has advanced end-  stage arthritis of their Left  hip with progressively worsening pain and  dysfunction.The patient has failed nonoperative management and presents for  total hip arthroplasty.   Laboratory Data: Admission on 09/27/2016    Component Date Value Ref Range Status  . WBC 09/28/2016 13.5* 4.0 - 10.5 K/uL Final  . RBC 09/28/2016 3.88  3.87 - 5.11 MIL/uL Final  . Hemoglobin 09/28/2016 9.0* 12.0 - 15.0 g/dL Final  . HCT 09/28/2016 29.5* 36.0 - 46.0 % Final  . MCV 09/28/2016 76.0* 78.0 - 100.0 fL Final  . MCH 09/28/2016 23.2* 26.0 - 34.0 pg Final  . MCHC 09/28/2016 30.5  30.0 - 36.0 g/dL Final  . RDW 09/28/2016 16.4* 11.5 - 15.5 % Final  . Platelets 09/28/2016 282  150 - 400 K/uL Final  . Sodium 09/28/2016 139  135 - 145 mmol/L Final  . Potassium 09/28/2016 3.0* 3.5 - 5.1 mmol/L Final  . Chloride 09/28/2016 103  101 - 111 mmol/L Final  . CO2 09/28/2016 29  22 - 32 mmol/L Final  . Glucose, Bld 09/28/2016 152* 65 - 99 mg/dL Final  . BUN 09/28/2016 12  6 - 20 mg/dL Final  . Creatinine, Ser 09/28/2016 0.72  0.44 - 1.00 mg/dL Final  . Calcium 09/28/2016 8.3* 8.9 - 10.3 mg/dL Final  . GFR calc non Af Amer 09/28/2016 >60  >60 mL/min Final  . GFR calc Af Amer 09/28/2016 >60  >60 mL/min Final   Comment: (NOTE) The eGFR has been calculated using the CKD EPI equation. This calculation has not  been validated in all clinical situations. eGFR's persistently <60 mL/min signify possible Chronic Kidney Disease.   . Anion gap 09/28/2016 7  5 - 15 Final  . WBC 09/29/2016 14.8* 4.0 - 10.5 K/uL Final  . RBC 09/29/2016 3.81* 3.87 - 5.11 MIL/uL Final  . Hemoglobin 09/29/2016 8.9* 12.0 - 15.0 g/dL Final  . HCT 09/29/2016 29.0* 36.0 - 46.0 % Final  . MCV 09/29/2016 76.1* 78.0 - 100.0 fL Final  . MCH 09/29/2016 23.4* 26.0 - 34.0 pg Final  . MCHC 09/29/2016 30.7  30.0 - 36.0 g/dL Final  . RDW 09/29/2016 16.3* 11.5 - 15.5 % Final  . Platelets 09/29/2016 252  150 - 400 K/uL Final  . Sodium 09/29/2016 138  135 - 145 mmol/L Final  . Potassium 09/29/2016 3.5  3.5 - 5.1 mmol/L Final  . Chloride 09/29/2016 98* 101 - 111 mmol/L Final  . CO2 09/29/2016 31  22 - 32 mmol/L Final  . Glucose, Bld 09/29/2016 134* 65 - 99 mg/dL Final  . BUN  09/29/2016 13  6 - 20 mg/dL Final  . Creatinine, Ser 09/29/2016 0.61  0.44 - 1.00 mg/dL Final  . Calcium 09/29/2016 8.5* 8.9 - 10.3 mg/dL Final  . GFR calc non Af Amer 09/29/2016 >60  >60 mL/min Final  . GFR calc Af Amer 09/29/2016 >60  >60 mL/min Final   Comment: (NOTE) The eGFR has been calculated using the CKD EPI equation. This calculation has not been validated in all clinical situations. eGFR's persistently <60 mL/min signify possible Chronic Kidney Disease.   Georgiann Hahn gap 09/29/2016 9  5 - 15 Final  Hospital Outpatient Visit on 09/18/2016  Component Date Value Ref Range Status  . MRSA, PCR 09/20/2016 POSITIVE* NEGATIVE Final   Comment: RESULT CALLED TO, READ BACK BY AND VERIFIED WITH: MANNY,B. RN @0712  ON 11.22.17 BY [email or web address]   . Staphylococcus aureus 09/20/2016 POSITIVE* NEGATIVE Final   Comment:        The Xpert SA Assay (FDA approved for NASAL specimens in patients over 38 years of age), is one component of a comprehensive surveillance program.  Test performance has been validated by [facility] for patients greater than or equal to 35 year old. It is not intended to diagnose infection nor to guide or monitor treatment.   Marland Kitchen aPTT 09/18/2016 40* 24 - 36 seconds Final   Comment:        IF BASELINE aPTT IS ELEVATED, SUGGEST PATIENT RISK ASSESSMENT BE USED TO DETERMINE APPROPRIATE ANTICOAGULANT THERAPY.   . WBC 09/18/2016 8.3  4.0 - 10.5 K/uL Final  . RBC 09/18/2016 4.95  3.87 - 5.11 MIL/uL Final  . Hemoglobin 09/18/2016 11.4* 12.0 - 15.0 g/dL Final  . HCT 09/18/2016 37.3  36.0 - 46.0 % Final  . MCV 09/18/2016 75.4* 78.0 - 100.0 fL Final  . MCH 09/18/2016 23.0* 26.0 - 34.0 pg Final  . MCHC 09/18/2016 30.6  30.0 - 36.0 g/dL Final  . RDW 09/18/2016 16.2* 11.5 - 15.5 % Final  . Platelets 09/18/2016 333  150 - 400 K/uL Final  . Sodium 09/18/2016 141  135 - 145 mmol/L Final  . Potassium 09/18/2016 4.0  3.5 - 5.1 mmol/L Final  . Chloride 09/18/2016 104  101 - 111 mmol/L  Final  . CO2 09/18/2016 27  22 - 32 mmol/L Final  . Glucose, Bld 09/18/2016 207* 65 - 99 mg/dL Final  . BUN 09/18/2016 28* 6 - 20 mg/dL Final  . Creatinine, Ser 09/18/2016 0.83  0.44 -  1.00 mg/dL Final  . Calcium 09/18/2016 9.4  8.9 - 10.3 mg/dL Final  . Total Protein 09/18/2016 7.4  6.5 - 8.1 g/dL Final  . Albumin 09/18/2016 4.5  3.5 - 5.0 g/dL Final  . AST 09/18/2016 33  15 - 41 U/L Final  . ALT 09/18/2016 31  14 - 54 U/L Final  . Alkaline Phosphatase 09/18/2016 72  38 - 126 U/L Final  . Total Bilirubin 09/18/2016 0.7  0.3 - 1.2 mg/dL Final  . GFR calc non Af Amer 09/18/2016 >60  >60 mL/min Final  . GFR calc Af Amer 09/18/2016 >60  >60 mL/min Final   Comment: (NOTE) The eGFR has been calculated using the CKD EPI equation. This calculation has not been validated in all clinical situations. eGFR's persistently <60 mL/min signify possible Chronic Kidney Disease.   . Anion gap 09/18/2016 10  5 - 15 Final  . Prothrombin Time 09/18/2016 13.6  11.4 - 15.2 seconds Final  . INR 09/18/2016 1.04   Final  . ABO/RH(D) 09/27/2016 O POS   Final  . Antibody Screen 09/27/2016 NEG   Final  . Sample Expiration 09/27/2016 09/30/2016   Final  . Extend sample reason 09/27/2016 NO TRANSFUSIONS OR PREGNANCY IN THE PAST 3 MONTHS   Final  . Color, Urine 09/18/2016 YELLOW  YELLOW Final  . APPearance 09/18/2016 CLOUDY* CLEAR Final  . Specific Gravity, Urine 09/18/2016 1.020  1.005 - 1.030 Final  . pH 09/18/2016 6.5  5.0 - 8.0 Final  . Glucose, UA 09/18/2016 NEGATIVE  NEGATIVE mg/dL Final  . Hgb urine dipstick 09/18/2016 NEGATIVE  NEGATIVE Final  . Bilirubin Urine 09/18/2016 NEGATIVE  NEGATIVE Final  . Ketones, ur 09/18/2016 NEGATIVE  NEGATIVE mg/dL Final  . Protein, ur 09/18/2016 NEGATIVE  NEGATIVE mg/dL Final  . Nitrite 09/18/2016 POSITIVE* NEGATIVE Final  . Leukocytes, UA 09/18/2016 LARGE* NEGATIVE Final  . Squamous Epithelial / LPF 09/18/2016 NONE SEEN  NONE SEEN Final  . WBC, UA 09/18/2016 6-30  0 -  5 WBC/hpf Final  . RBC / HPF 09/18/2016 NONE SEEN  0 - 5 RBC/hpf Final  . Bacteria, UA 09/18/2016 FEW* NONE SEEN Final     X-Rays:Dg Pelvis Portable  Result Date: 09/27/2016 CLINICAL DATA:  Status post left total hip replacement EXAM: PORTABLE PELVIS 1-2 VIEWS COMPARISON:  December 16, 2008 FINDINGS: There are now total hip replacements bilaterally with prosthetic components well-seated bilaterally. No acute fracture or dislocation evident. There is a drain lateral to the left acetabulum. IMPRESSION: There are now bilateral total hip replacements with prosthetic components appearing well-seated on frontal view. No fracture or dislocation evident on single view. Electronically Signed   By: Lowella Grip III M.D.   On: 09/27/2016 09:54    EKG: Orders placed or performed in visit on 08/09/16  . EKG 12-Lead     Hospital Course: Patient was admitted to [facility] and taken to the OR and underwent the above state procedure without complications.  Patient tolerated the procedure well and was later transferred to the recovery room and then to the orthopaedic floor for postoperative care.  They were given PO and IV analgesics for pain control following their surgery.  They were given 24 hours of postoperative antibiotics of  Anti-infectives    Start     Dose/Rate Route Frequency Ordered Stop   09/27/16 1400  ceFAZolin (ANCEF) IVPB 2g/100 mL premix     2 g 200 mL/hr over 30 Minutes Intravenous Every 6 hours 09/27/16 1057 09/27/16 2043  09/27/16 0800  ceFAZolin (ANCEF) IVPB 2 g/50 mL premix     2 g 100 mL/hr over 30 Minutes Intravenous  Once 09/27/16 0747 09/27/16 0726     and started on DVT prophylaxis in the form of Xarelto.   PT and OT were ordered for total hip protocol.  The patient was allowed to be WBAT with therapy. Discharge planning was consulted to help with postop disposition and equipment needs.  Patient had a decent night on the evening of surgery.  They started to get up  OOB with therapy on day one.  Hemovac drain was pulled without difficulty.  Continued to work with therapy into day two.  Dressing was changed on day two and the incision was healing well. Patient was seen in rounds by Dr. Wynelle Link and was ready to go home.  Discharge home with home health Diet - Cardiac diet Follow up - in 2 weeks Activity - WBAT Disposition - Home Condition Upon Discharge - Good D/C Meds - See DC Summary DVT Prophylaxis - Xarelto  Discharge Instructions    Call MD / Call 911    Complete by:  As directed    If you experience chest pain or shortness of breath, CALL 911 and be transported to the hospital emergency room.  If you develope a fever above 101 F, pus (white drainage) or increased drainage or redness at the wound, or calf pain, call your surgeon's office.   Change dressing    Complete by:  As directed    You may change your dressing dressing daily with sterile 4 x 4 inch gauze dressing and paper tape.  Do not submerge the incision under water.   Constipation Prevention    Complete by:  As directed    Drink plenty of fluids.  Prune juice may be helpful.  You may use a stool softener, such as Colace (over the counter) 100 mg twice a day.  Use MiraLax (over the counter) for constipation as needed.   Diet - low sodium heart healthy    Complete by:  As directed    Discharge instructions    Complete by:  As directed    Pick up stool softner and laxative for home use following surgery while on pain medications. Do not submerge incision under water. Please use good hand washing techniques while changing dressing each day. May shower starting three days after surgery. Please use a clean towel to pat the incision dry following showers. Continue to use ice for pain and swelling after surgery. Do not use any lotions or creams on the incision until instructed by your surgeon.   Postoperative Constipation Protocol  Constipation - defined medically as fewer than three  stools per week and severe constipation as less than one stool per week.  One of the most common issues patients have following surgery is constipation.  Even if you have a regular bowel pattern at home, your normal regimen is likely to be disrupted due to multiple reasons following surgery.  Combination of anesthesia, postoperative narcotics, change in appetite and fluid intake all can affect your bowels.  In order to avoid complications following surgery, here are some recommendations in order to help you during your recovery period.  Colace (docusate) - Pick up an over-the-counter form of Colace or another stool softener and take twice a day as long as you are requiring postoperative pain medications.  Take with a full glass of water daily.  If you experience loose stools or diarrhea, hold  the colace until you stool forms back up.  If your symptoms do not get better within 1 week or if they get worse, check with your doctor.  Dulcolax (bisacodyl) - Pick up over-the-counter and take as directed by the product packaging as needed to assist with the movement of your bowels.  Take with a full glass of water.  Use this product as needed if not relieved by Colace only.   MiraLax (polyethylene glycol) - Pick up over-the-counter to have on hand.  MiraLax is a solution that will increase the amount of water in your bowels to assist with bowel movements.  Take as directed and can mix with a glass of water, juice, soda, coffee, or tea.  Take if you go more than two days without a movement. Do not use MiraLax more than once per day. Call your doctor if you are still constipated or irregular after using this medication for 7 days in a row.  If you continue to have problems with postoperative constipation, please contact the office for further assistance and recommendations.  If you experience "the worst abdominal pain ever" or develop nausea or vomiting, please contact the office immediatly for further  recommendations for treatment.   Take Xarelto for two and a half more weeks, then discontinue Xarelto. Once the patient has completed the blood thinner regimen, then take a Baby 81 mg Aspirin daily for three more weeks.   Do not sit on low chairs, stoools or toilet seats, as it may be difficult to get up from low surfaces    Complete by:  As directed    Driving restrictions    Complete by:  As directed    No driving until released by the physician.   Increase activity slowly as tolerated    Complete by:  As directed    Lifting restrictions    Complete by:  As directed    No lifting until released by the physician.   Patient may shower    Complete by:  As directed    You may shower without a dressing once there is no drainage.  Do not wash over the wound.  If drainage remains, do not shower until drainage stops.   TED hose    Complete by:  As directed    Use stockings (TED hose) for 3 weeks on both leg(s).  You may remove them at night for sleeping.   Weight bearing as tolerated    Complete by:  As directed    Laterality:  left   Extremity:  Lower       Medication List    STOP taking these medications   amoxicillin 500 MG capsule Commonly known as:  AMOXIL   amoxicillin-clavulanate 875-125 MG tablet Commonly known as:  AUGMENTIN   betamethasone valerate lotion 0.1 % Commonly known as:  VALISONE   CALCIUM CITRATE PO   Cranberry 500 MG Tabs   diphenhydrAMINE 25 mg capsule Commonly known as:  BENADRYL   estropipate 0.75 MG tablet Commonly known as:  OGEN   FISH OIL PO   FLAXSEED OIL PO   ibuprofen 200 MG tablet Commonly known as:  ADVIL,MOTRIN   medroxyPROGESTERone 2.5 MG tablet Commonly known as:  PROVERA   multivitamin tablet   naproxen sodium 220 MG tablet Commonly known as:  ANAPROX   PROBIOTIC DAILY PO   VITAMIN B COMPLEX PO   vitamin C 1000 MG tablet     TAKE these medications   acetaminophen 500 MG tablet Commonly  known as:  TYLENOL Take  1,000 mg by mouth every 6 (six) hours as needed for mild pain.   Butalbital-APAP-Caffeine 50-300-40 MG Caps Take 1 capsule by mouth 2 (two) times daily as needed (headache).   diazepam 5 MG tablet Commonly known as:  VALIUM Take 1 tablet (5 mg total) by mouth every 6 (six) hours as needed for muscle spasms.   ezetimibe 10 MG tablet Commonly known as:  ZETIA Take 1 tablet (10 mg total) by mouth daily.   hydrochlorothiazide 25 MG tablet Commonly known as:  HYDRODIURIL Take 1 tablet (25 mg total) by mouth daily.   HYDROmorphone 2 MG tablet Commonly known as:  DILAUDID Take 1-2 tablets (2-4 mg total) by mouth every 4 (four) hours as needed for severe pain.   hyoscyamine 0.125 MG SL tablet Commonly known as:  LEVSIN SL Place 1 tab on the tongue to dissolve at the at the onset of cramping, spasms then every 4 hours as needed. What changed:  how much to take  how to take this  when to take this  reasons to take this  additional instructions   iron polysaccharides 150 MG capsule Commonly known as:  NIFEREX Take 1 capsule (150 mg total) by mouth 2 (two) times daily.   loratadine 10 MG tablet Commonly known as:  CLARITIN Take 10 mg by mouth daily.   omeprazole 20 MG capsule Commonly known as:  PRILOSEC Take 1 capsule (20 mg total) by mouth daily. What changed:  when to take this   polyethylene glycol packet Commonly known as:  MIRALAX / GLYCOLAX Take 17 g by mouth daily. States takes 1/3 packet at bedtime   potassium gluconate 595 (99 K) MG Tabs tablet Take 1,190 mg by mouth every evening.   prednisoLONE 5 MG Tabs tablet Take 5-10 mg by mouth daily as needed (for mouth ulcers). 5-10 MG DEPENDS ON PAIN   predniSONE 10 MG tablet Commonly known as:  DELTASONE Take 0.5 tablets (5 mg total) by mouth daily.   propranolol 40 MG tablet Commonly known as:  INDERAL TAKE 1 TABLET DAILY What changed:  See the new instructions.   rivaroxaban 10 MG Tabs tablet Commonly  known as:  XARELTO Take 1 tablet (10 mg total) by mouth daily with breakfast. Take Xarelto for two and a half more weeks, then discontinue Xarelto. Once the patient has completed the blood thinner regimen, then take a Baby 81 mg Aspirin daily for three more weeks.   traMADol 50 MG tablet Commonly known as:  ULTRAM Take 1-2 tablets (50-100 mg total) by mouth every 6 (six) hours as needed (mild pain).            Durable Medical Equipment        Start     Ordered   09/28/16 1517  For home use only DME Walker rolling  Once    Question:  Patient needs a walker to treat with the following condition  Answer:  OA (osteoarthritis) of hip   09/28/16 1516     Follow-up Information    [facility] Follow up.   Specialty:  Home Health Services Why:  home health physical therapy Contact information: [address] [place] [address] [phone]        [facility] Follow up.   Why:  rolling walker Contact information: [place] [address] [phone]        [name], MD. Schedule an  appointment as soon as possible for a visit on 10/10/2016.   Specialty:  Orthopedic Surgery Contact information: [address] [place] [address] [phone]           Signed: Arlee Muslim, PA-C Orthopaedic Surgery 09/29/2016, 7:18 AM

## 2016-09-29 NOTE — Progress Notes (Signed)
Subjective: 2 Days Post-Op Procedure(s) (LRB): LEFT TOTAL HIP ARTHROPLASTY ANTERIOR APPROACH (Left) Patient reports pain as mild.   Patient seen in rounds for Dr. Wynelle Link. Patient is well, but has had some minor complaints of pain in the hip, requiring pain medications Patient is ready to go home today.  Objective: Vital signs in last 24 hours: Temp:  [98.3 F (36.8 C)-99.1 F (37.3 C)] 98.9 F (37.2 C) (12/01 0621) Pulse Rate:  [73-88] 78 (12/01 0621) Resp:  [16-19] 16 (12/01 0621) BP: (93-123)/(45-64) 123/64 (12/01 0621) SpO2:  [96 %-100 %] 96 % (12/01 0621)  Intake/Output from previous day:  Intake/Output Summary (Last 24 hours) at 09/29/16 0711 Last data filed at 09/29/16 0600  Gross per 24 hour  Intake           576.58 ml  Output             2725 ml  Net         -2148.42 ml    Intake/Output this shift: No intake/output data recorded.  Labs:  Recent Labs  09/28/16 0422 09/29/16 0354  HGB 9.0* 8.9*    Recent Labs  09/28/16 0422 09/29/16 0354  WBC 13.5* 14.8*  RBC 3.88 3.81*  HCT 29.5* 29.0*  PLT 282 252    Recent Labs  09/28/16 0422 09/29/16 0354  NA 139 138  K 3.0* 3.5  CL 103 98*  CO2 29 31  BUN 12 13  CREATININE 0.72 0.61  GLUCOSE 152* 134*  CALCIUM 8.3* 8.5*   No results for input(s): LABPT, INR in the last 72 hours.  EXAM: General - Patient is pain in the hip, requiring pain medications Extremity - Neurovascular intact Sensation intact distally Dorsiflexion/Plantar flexion intact Incision - clean, dry, no drainage Motor Function - intact, moving foot and toes well on exam.   Assessment/Plan: 2 Days Post-Op Procedure(s) (LRB): LEFT TOTAL HIP ARTHROPLASTY ANTERIOR APPROACH (Left) Procedure(s) (LRB): LEFT TOTAL HIP ARTHROPLASTY ANTERIOR APPROACH (Left) Past Medical History:  Diagnosis Date  . 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 Arthritis   . Bilateral knee pain    sees Dr. Wynelle Link   . Bilateral renal cysts     . Chronic UTI (urinary tract infection)   . Complication of anesthesia   . Cough 11/30/14    cough since 11/20/14- now productine- green with green mucus out of nose- no fever  . Diverticulitis   . GERD (gastroesophageal reflux disease)   . History of kidney stones   . History of melanoma excision   . History of renal calculi   . 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 Colposcopy inadequate/normal 04/2015  . Hyperlipidemia   . IBS (irritable bowel syndrome)   . IBS (irritable bowel syndrome)   . Kidney stones    bilateral  . Menopause    sees Dr. Phineas Real  . Migraines   . Nephrolithiasis    left  . Neurogenic bladder   . Normal cardiac stress test 2012  . PONV (postoperative nausea and vomiting)   . Tendonitis    right ankle  . Urinary urgency   . UTI (urinary tract infection)    started atb on 03-24-12   Principal Problem:   OA (osteoarthritis) of hip  Estimated body mass index is 32.58 kg/m as calculated from the following:   Height as of this encounter: 5\' 7"  (1.702 m).   Weight as of this  encounter: 94.3 kg (208 lb). Up with therapy Discharge home with home health Diet - Cardiac diet Follow up - in 2 weeks Activity - WBAT Disposition - Home Condition Upon Discharge - Good D/C Meds - See DC Summary DVT Prophylaxis - Xarelto  Arlee Muslim, PA-C Orthopaedic Surgery 09/29/2016, 7:11 AM

## 2016-10-02 ENCOUNTER — Ambulatory Visit (INDEPENDENT_AMBULATORY_CARE_PROVIDER_SITE_OTHER): Payer: 59 | Admitting: Family Medicine

## 2016-10-02 ENCOUNTER — Encounter: Payer: Self-pay | Admitting: Family Medicine

## 2016-10-02 VITALS — BP 90/60 | HR 91 | Temp 99.4°F | Ht 67.0 in

## 2016-10-02 DIAGNOSIS — R300 Dysuria: Secondary | ICD-10-CM

## 2016-10-02 DIAGNOSIS — R110 Nausea: Secondary | ICD-10-CM

## 2016-10-02 DIAGNOSIS — N3000 Acute cystitis without hematuria: Secondary | ICD-10-CM | POA: Diagnosis not present

## 2016-10-02 LAB — POC URINALSYSI DIPSTICK (AUTOMATED)
Bilirubin, UA: NEGATIVE
Blood, UA: NEGATIVE
Glucose, UA: NEGATIVE
Ketones, UA: NEGATIVE
Nitrite, UA: POSITIVE
Protein, UA: NEGATIVE
Spec Grav, UA: 1.015
Urobilinogen, UA: 0.2
pH, UA: 7

## 2016-10-02 MED ORDER — ONDANSETRON 4 MG PO TBDP: 4.0000 mg | ORAL_TABLET | Freq: Three times a day (TID) | ORAL | 0 refills | Status: DC | PRN

## 2016-10-02 MED ORDER — NITROFURANTOIN MONOHYD MACRO 100 MG PO CAPS: 100.0000 mg | ORAL_CAPSULE | Freq: Two times a day (BID) | ORAL | 0 refills | Status: DC

## 2016-10-02 NOTE — Progress Notes (Signed)
Pre visit review using our clinic review tool, if applicable. No additional management support is needed unless otherwise documented below in the visit note. Weight not taken today due patient being in a rollator/walker.

## 2016-10-02 NOTE — Addendum Note (Signed)
Addended by: Agnes Lawrence on: 10/02/2016 05:06 PM   Modules accepted: Orders

## 2016-10-02 NOTE — Patient Instructions (Signed)
I sent the antibiotic and the zofran to the pharmacy.  I hope you are feeling better soon!  Follow up if worsening, new symptoms or symptoms persist despite treatment.

## 2016-10-02 NOTE — Progress Notes (Signed)
HPI:  Victoria Mosley is a very pleasant 65 year old here for an acute visit for dysuria. She just got out of the hospital last week after a hip replacement. For the last several days she has had some burning with urination, odor to the urine and frequency. No flank pain, hematuria, fevers, vomiting or vaginal symptoms. She reports a history of UTI reports she only can take Macrobid and that it works great for her. She also requests some Zofran, as she reports that ever since surgery she has had some mild nausea and they did not give her Zofran on discharge. She also had lower blood pressure in the hospital and since discharge and is trying to wean off her pain medications and drink more fluids.  ROS: See pertinent positives and negatives per HPI.  Past Medical History:  Diagnosis Date  . 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 Arthritis   . Bilateral knee pain    sees Dr. Wynelle Link   . Bilateral renal cysts   . Chronic UTI (urinary tract infection)   . Complication of anesthesia   . Cough 11/30/14    cough since 11/20/14- now productine- green with green mucus out of nose- no fever  . Diverticulitis   . GERD (gastroesophageal reflux disease)   . History of kidney stones   . History of melanoma excision   . History of renal calculi   . 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 Colposcopy inadequate/normal 04/2015  . Hyperlipidemia   . IBS (irritable bowel syndrome)   . IBS (irritable bowel syndrome)   . Kidney stones    bilateral  . Menopause    sees Dr. Phineas Real  . Migraines   . Nephrolithiasis    left  . Neurogenic bladder   . Normal cardiac stress test 2012  . PONV (postoperative nausea and vomiting)   . Tendonitis    right ankle  . Urinary urgency   . UTI (urinary tract infection)    started atb on 03-24-12    Past Surgical History:  Procedure Laterality Date  . BACK SURGERY  10/1991   L5 ruptured disc- Dr.Robinson   . CESAREAN SECTION  12/1980  . COLONOSCOPY  03-28-12   per Dr. Henrene Pastor, polyp,  repeat in 5 yrs  . CYSTOSCOPY WITH RETROGRADE PYELOGRAM, URETEROSCOPY AND STENT PLACEMENT Left 12/25/2012   Procedure: CYSTOSCOPY WITH RETROGRADE PYELOGRAM, URETEROSCOPY, EXTRACTION OF  LEFT STONE WITH BASKETAND STENT PLACEMENT;  Surgeon: Molli Hazard, MD;  Location: [facility];  Service: Urology;  Laterality: Left;  2.5 HRS   . FOOT SURGERY Bilateral 1987   per Dr. Alphonzo Cruise  . HEMATOMA EVACUATION  07/08/2012   Procedure: EVACUATION HEMATOMA;  Surgeon: Kristeen Miss, MD;  Location: [place] [facility];  Service: Neurosurgery;  Laterality: N/A;  Cervical Seven-thoracic One Laminectomy and Decompression of Epidural Hematoma  . KIDNEY STONE SURGERY  2005   per Dr. Reece Agar  . LEEP  07/2015   final pathology LGSIL with clear margins negative ECC  . LITHOTRIPSY  2007   x 3  . [place], at L5 per Dr. Quentin Cornwall  . MELANOMA EXCISION  2007   left upper arm per dr. Teressa Senter  . NASAL SEPTUM SURGERY  1992   per Dr. Cynda Familia  . POSTERIOR CERVICAL LAMINECTOMY  07/08/2012   Procedure: POSTERIOR CERVICAL LAMINECTOMY;  Surgeon: Kristeen Miss, MD;  Location: [place]  [facility];  Service: Neurosurgery;  Laterality: N/A;  Cervical Seven- thoracic One Laminectomy and Decompression of Epidural Hematoma  . TOTAL HIP ARTHROPLASTY Right 2010   [place]  . TOTAL HIP ARTHROPLASTY Left 09/27/2016   Procedure: LEFT TOTAL HIP ARTHROPLASTY ANTERIOR APPROACH;  Surgeon: Gaynelle Arabian, MD;  Location: [facility];  Service: Orthopedics;  Laterality: Left;  . TOTAL KNEE ARTHROPLASTY Right 12/07/2014   Procedure: RIGHT TOTAL KNEE ARTHROPLASTY;  Surgeon: [name], MD;  Location: [facility];  Service: Orthopedics;  Laterality: Right;  . TUBAL LIGATION  01/1988    Family History  Problem Relation Age of Onset  . Heart disease Mother   . Hypertension Mother   . Diabetes Mother   . Hypertension Father   . Leukemia Father    . Hypertension Sister   . Heart disease Sister   . Bladder Cancer Sister   . Stomach cancer Paternal Grandmother   . Colon cancer Neg Hx   . Esophageal cancer Neg Hx   . Rectal cancer Neg Hx     Social History   Social History  . Marital status: Married    Spouse name: N/A  . Number of children: N/A  . Years of education: N/A   Social History Main Topics  . Smoking status: Former Smoker    Packs/day: 0.25    Quit date: 12/20/1998  . Smokeless tobacco: Never Used  . Alcohol use No  . Drug use: No  . Sexual activity: Yes    Partners: Male    Birth control/ protection: Post-menopausal     Comment: 1st intercourse 46 yo-2 partners   Other Topics Concern  . None   Social History Narrative  . None     Current Outpatient Prescriptions:  .  acetaminophen (TYLENOL) 500 MG tablet, Take 1,000 mg by mouth every 6 (six) hours as needed for mild pain., Disp: , Rfl:  .  Butalbital-APAP-Caffeine 50-300-40 MG CAPS, Take 1 capsule by mouth 2 (two) times daily as needed (headache)., Disp: 84 capsule, Rfl: 1 .  diazepam (VALIUM) 5 MG tablet, Take 1 tablet (5 mg total) by mouth every 6 (six) hours as needed for muscle spasms., Disp: 90 tablet, Rfl: 1 .  ezetimibe (ZETIA) 10 MG tablet, Take 1 tablet (10 mg total) by mouth daily., Disp: 90 tablet, Rfl: 3 .  hydrochlorothiazide (HYDRODIURIL) 25 MG tablet, Take 1 tablet (25 mg total) by mouth daily., Disp: 90 tablet, Rfl: 3 .  HYDROmorphone (DILAUDID) 2 MG tablet, Take 1-2 tablets (2-4 mg total) by mouth every 4 (four) hours as needed for severe pain., Disp: 80 tablet, Rfl: 0 .  hyoscyamine (LEVSIN SL) 0.125 MG SL tablet, Place 1 tab on the tongue to dissolve at the at the onset of cramping, spasms then every 4 hours as needed. (Patient taking differently: Take 0.125 mg by mouth every 4 (four) hours as needed for cramping. Place 1 tab on the tongue to dissolve at the at the onset of cramping, spasms then every 4 hours as needed.), Disp: 90  tablet, Rfl: 5 .  iron polysaccharides (NIFEREX) 150 MG capsule, Take 1 capsule (150 mg total) by mouth 2 (two) times daily., Disp: 42 capsule, Rfl: 0 .  loratadine (CLARITIN) 10 MG tablet, Take 10 mg by mouth daily. , Disp: , Rfl:  .  omeprazole (PRILOSEC) 20 MG capsule, Take 1 capsule (20 mg total) by mouth daily. (Patient taking differently: Take 20 mg by mouth at bedtime. ), Disp: 90 capsule, Rfl: 3 .  polyethylene glycol (MIRALAX / GLYCOLAX) packet, Take 17 g by mouth daily. States takes 1/3 packet at bedtime, Disp: , Rfl:  .  potassium gluconate 595 MG TABS, Take 1,190 mg by mouth every evening. , Disp: , Rfl:  .  prednisoLONE 5 MG TABS tablet, Take 5-10 mg by mouth daily as needed (for mouth ulcers). 5-10 MG DEPENDS ON PAIN, Disp: , Rfl:  .  predniSONE (DELTASONE) 10 MG tablet, Take 0.5 tablets (5 mg total) by mouth daily., Disp: 30 tablet, Rfl: 5 .  propranolol (INDERAL) 40 MG tablet, TAKE 1 TABLET DAILY (Patient taking differently: TAKE 1 TABLET AT BEDTIME), Disp: 90 tablet, Rfl: 3 .  rivaroxaban (XARELTO) 10 MG TABS tablet, Take 1 tablet (10 mg total) by mouth daily with breakfast. Take Xarelto for two and a half more weeks, then discontinue Xarelto. Once the patient has completed the blood thinner regimen, then take a Baby 81 mg Aspirin daily for three more weeks., Disp: 19 tablet, Rfl: 0 .  traMADol (ULTRAM) 50 MG tablet, Take 1-2 tablets (50-100 mg total) by mouth every 6 (six) hours as needed (mild pain)., Disp: 80 tablet, Rfl: 1 .  nitrofurantoin, macrocrystal-monohydrate, (MACROBID) 100 MG capsule, Take 1 capsule (100 mg total) by mouth 2 (two) times daily., Disp: 14 capsule, Rfl: 0 .  ondansetron (ZOFRAN-ODT) 4 MG disintegrating tablet, Take 1 tablet (4 mg total) by mouth every 8 (eight) hours as needed for nausea or vomiting., Disp: 20 tablet, Rfl: 0  EXAM:  Vitals:   10/02/16 1644  BP: 90/60  Pulse: 91  Temp: 99.4 F (37.4 C)    There is no height or weight on file to  calculate BMI.  GENERAL: vitals reviewed and listed above, alert, oriented, appears well hydrated and in no acute distress  HEENT: atraumatic, conjunttiva clear, no obvious abnormalities on inspection of external nose and ears  NECK: no obvious masses on inspection  LUNGS: clear to auscultation bilaterally, no wheezes, rales or rhonchi, good air movement  CV: HRRR, no peripheral edema  ABD: soft, NTTP, no CVA TTP  MS: using walker  PSYCH: pleasant and cooperative, no obvious depression or anxiety  ASSESSMENT AND PLAN:  Discussed the following assessment and plan:  Dysuria  Acute cystitis without hematuria  Nausea without vomiting  -udip with leuks and nit + -tx with macrobid, zofran -culture pending -Patient advised to return or notify a doctor immediately if symptoms worsen or persist or new concerns arise.  Patient Instructions  I sent the antibiotic and the zofran to the pharmacy.  I hope you are feeling better soon!  Follow up if worsening, new symptoms or symptoms persist despite treatment.   Colin Benton R., DO

## 2016-10-04 ENCOUNTER — Telehealth: Payer: Self-pay | Admitting: Family Medicine

## 2016-10-04 NOTE — Telephone Encounter (Signed)
Pt saw dr Maudie Mercury on 12-4 and zofran is not working. Pt would like phenergan all into [facility] [address]. Pt is aware dr Maudie Mercury out of office today

## 2016-10-05 ENCOUNTER — Ambulatory Visit (INDEPENDENT_AMBULATORY_CARE_PROVIDER_SITE_OTHER): Payer: 59 | Admitting: Family Medicine

## 2016-10-05 ENCOUNTER — Encounter: Payer: Self-pay | Admitting: Family Medicine

## 2016-10-05 VITALS — BP 118/70 | HR 97 | Temp 99.6°F | Ht 67.0 in

## 2016-10-05 DIAGNOSIS — K625 Hemorrhage of anus and rectum: Secondary | ICD-10-CM

## 2016-10-05 DIAGNOSIS — R197 Diarrhea, unspecified: Secondary | ICD-10-CM

## 2016-10-05 DIAGNOSIS — D508 Other iron deficiency anemias: Secondary | ICD-10-CM

## 2016-10-05 DIAGNOSIS — K649 Unspecified hemorrhoids: Secondary | ICD-10-CM | POA: Diagnosis not present

## 2016-10-05 MED ORDER — PROMETHAZINE HCL 25 MG PO TABS
25.0000 mg | ORAL_TABLET | ORAL | 2 refills | Status: DC | PRN
Start: 2016-10-05 — End: 2016-11-21

## 2016-10-05 NOTE — Telephone Encounter (Signed)
I sent script e-scribe to [facility] and I spoke with pt.

## 2016-10-05 NOTE — Progress Notes (Signed)
Pre visit review using our clinic review tool, if applicable. No additional management support is needed unless otherwise documented below in the visit note. 

## 2016-10-05 NOTE — Addendum Note (Signed)
Addended by: Aggie Hacker A on: 10/05/2016 12:46 PM   Modules accepted: Orders

## 2016-10-05 NOTE — Telephone Encounter (Signed)
Call in Phenergan 25 mg every 4 hours prn nausea, #60 with 2 rf

## 2016-10-05 NOTE — Telephone Encounter (Signed)
See pcp notes.

## 2016-10-05 NOTE — Patient Instructions (Signed)
BEFORE YOU LEAVE: -follow up: schedule follow up with Dr. Sarajane Jews next week -labs  Hold off on the iron for now.  Call your gastroenterologist

## 2016-10-05 NOTE — Progress Notes (Signed)
HPI:  Victoria Mosley is a pleasant and anxious 65 year old here for an acute visit for "I cannot take the iron." She and her husband report a long history of hemorrhoids. She recently had a hip replacement and was discharged on iron. She reports every time she takes the iron, it upsets her stomach and causes nausea and diarrhea. She reports the diarrhea has caused a flare in her hemorrhoids and she has had some bleeding with these. She cannot apply topical treatments to these as it causes too much burning. She is doing sitz baths. She has a gastroenterologist for hemorrhoids and reports she has had several procedures done on her hemorrhoids in the past.  She was recently treated for a UTI and reports that she is feeling much better in terms of her symptoms. She does endorse more than 3 episodes of diarrhea more than 3 days in a row recently. She stopped the iron today and reports the diarrhea and nausea is resolving.  ROS: See pertinent positives and negatives per HPI.  Past Medical History:  Diagnosis Date  . 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 Arthritis   . Bilateral knee pain    sees Dr. Wynelle Link   . Bilateral renal cysts   . Chronic UTI (urinary tract infection)   . Complication of anesthesia   . Cough 11/30/14    cough since 11/20/14- now productine- green with green mucus out of nose- no fever  . Diverticulitis   . GERD (gastroesophageal reflux disease)   . History of kidney stones   . History of melanoma excision   . History of renal calculi   . 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 Colposcopy inadequate/normal 04/2015  . Hyperlipidemia   . IBS (irritable bowel syndrome)   . IBS (irritable bowel syndrome)   . Kidney stones    bilateral  . Menopause    sees Dr. Phineas Real  . Migraines   . Nephrolithiasis    left  . Neurogenic bladder   . Normal cardiac stress test 2012  . PONV (postoperative nausea and vomiting)    . Tendonitis    right ankle  . Urinary urgency   . UTI (urinary tract infection)    started atb on 03-24-12    Past Surgical History:  Procedure Laterality Date  . BACK SURGERY  10/1991   L5 ruptured disc- Dr.Robinson  . CESAREAN SECTION  12/1980  . COLONOSCOPY  03-28-12   per Dr. Henrene Pastor, polyp,  repeat in 5 yrs  . CYSTOSCOPY WITH RETROGRADE PYELOGRAM, URETEROSCOPY AND STENT PLACEMENT Left 12/25/2012   Procedure: CYSTOSCOPY WITH RETROGRADE PYELOGRAM, URETEROSCOPY, EXTRACTION OF  LEFT STONE WITH BASKETAND STENT PLACEMENT;  Surgeon: Molli Hazard, MD;  Location: [facility];  Service: Urology;  Laterality: Left;  2.5 HRS   . FOOT SURGERY Bilateral 1987   per Dr. Alphonzo Cruise  . HEMATOMA EVACUATION  07/08/2012   Procedure: EVACUATION HEMATOMA;  Surgeon: Kristeen Miss, MD;  Location: [place] [facility];  Service: Neurosurgery;  Laterality: N/A;  Cervical Seven-thoracic One Laminectomy and Decompression of Epidural Hematoma  . KIDNEY STONE SURGERY  2005   per Dr. Reece Agar  . LEEP  07/2015   final pathology LGSIL with clear margins negative ECC  . LITHOTRIPSY  2007   x 3  . [place], at L5 per Dr. Quentin Cornwall  . MELANOMA EXCISION  2007  left upper arm per dr. Teressa Senter  . NASAL SEPTUM SURGERY  1992   per Dr. Cynda Familia  . POSTERIOR CERVICAL LAMINECTOMY  07/08/2012   Procedure: POSTERIOR CERVICAL LAMINECTOMY;  Surgeon: Kristeen Miss, MD;  Location: [place] [facility];  Service: Neurosurgery;  Laterality: N/A;  Cervical Seven- thoracic One Laminectomy and Decompression of Epidural Hematoma  . TOTAL HIP ARTHROPLASTY Right 2010   [place]  . TOTAL HIP ARTHROPLASTY Left 09/27/2016   Procedure: LEFT TOTAL HIP ARTHROPLASTY ANTERIOR APPROACH;  Surgeon: Gaynelle Arabian, MD;  Location: [facility];  Service: Orthopedics;  Laterality: Left;  . TOTAL KNEE ARTHROPLASTY Right 12/07/2014   Procedure: RIGHT TOTAL KNEE ARTHROPLASTY;  Surgeon: [name], MD;  Location: [facility];  Service:  Orthopedics;  Laterality: Right;  . TUBAL LIGATION  01/1988    Family History  Problem Relation Age of Onset  . Heart disease Mother   . Hypertension Mother   . Diabetes Mother   . Hypertension Father   . Leukemia Father   . Hypertension Sister   . Heart disease Sister   . Bladder Cancer Sister   . Stomach cancer Paternal Grandmother   . Colon cancer Neg Hx   . Esophageal cancer Neg Hx   . Rectal cancer Neg Hx     Social History   Social History  . Marital status: Married    Spouse name: N/A  . Number of children: N/A  . Years of education: N/A   Social History Main Topics  . Smoking status: Former Smoker    Packs/day: 0.25    Quit date: 12/20/1998  . Smokeless tobacco: Never Used  . Alcohol use No  . Drug use: No  . Sexual activity: Yes    Partners: Male    Birth control/ protection: Post-menopausal     Comment: 1st intercourse 69 yo-2 partners   Other Topics Concern  . None   Social History Narrative  . None     Current Outpatient Prescriptions:  .  acetaminophen (TYLENOL) 500 MG tablet, Take 1,000 mg by mouth every 6 (six) hours as needed for mild pain., Disp: , Rfl:  .  Butalbital-APAP-Caffeine 50-300-40 MG CAPS, Take 1 capsule by mouth 2 (two) times daily as needed (headache)., Disp: 84 capsule, Rfl: 1 .  diazepam (VALIUM) 5 MG tablet, Take 1 tablet (5 mg total) by mouth every 6 (six) hours as needed for muscle spasms., Disp: 90 tablet, Rfl: 1 .  ezetimibe (ZETIA) 10 MG tablet, Take 1 tablet (10 mg total) by mouth daily., Disp: 90 tablet, Rfl: 3 .  hydrochlorothiazide (HYDRODIURIL) 25 MG tablet, Take 1 tablet (25 mg total) by mouth daily., Disp: 90 tablet, Rfl: 3 .  hyoscyamine (LEVSIN SL) 0.125 MG SL tablet, Place 1 tab on the tongue to dissolve at the at the onset of cramping, spasms then every 4 hours as needed. (Patient taking differently: Take 0.125 mg by mouth every 4 (four) hours as needed for cramping. Place 1 tab on the tongue to dissolve at the at  the onset of cramping, spasms then every 4 hours as needed.), Disp: 90 tablet, Rfl: 5 .  loratadine (CLARITIN) 10 MG tablet, Take 10 mg by mouth daily. , Disp: , Rfl:  .  nitrofurantoin, macrocrystal-monohydrate, (MACROBID) 100 MG capsule, Take 1 capsule (100 mg total) by mouth 2 (two) times daily., Disp: 14 capsule, Rfl: 0 .  omeprazole (PRILOSEC) 20 MG capsule, Take 1 capsule (20 mg total) by mouth daily. (Patient taking differently: Take 20 mg by  mouth at bedtime. ), Disp: 90 capsule, Rfl: 3 .  polyethylene glycol (MIRALAX / GLYCOLAX) packet, Take 17 g by mouth daily. States takes 1/3 packet at bedtime, Disp: , Rfl:  .  potassium gluconate 595 MG TABS, Take 1,190 mg by mouth every evening. , Disp: , Rfl:  .  prednisoLONE 5 MG TABS tablet, Take 5-10 mg by mouth daily as needed (for mouth ulcers). 5-10 MG DEPENDS ON PAIN, Disp: , Rfl:  .  predniSONE (DELTASONE) 10 MG tablet, Take 0.5 tablets (5 mg total) by mouth daily., Disp: 30 tablet, Rfl: 5 .  promethazine (PHENERGAN) 25 MG tablet, Take 1 tablet (25 mg total) by mouth every 4 (four) hours as needed for nausea or vomiting., Disp: 60 tablet, Rfl: 2 .  propranolol (INDERAL) 40 MG tablet, TAKE 1 TABLET DAILY (Patient taking differently: TAKE 1 TABLET AT BEDTIME), Disp: 90 tablet, Rfl: 3 .  rivaroxaban (XARELTO) 10 MG TABS tablet, Take 1 tablet (10 mg total) by mouth daily with breakfast. Take Xarelto for two and a half more weeks, then discontinue Xarelto. Once the patient has completed the blood thinner regimen, then take a Baby 81 mg Aspirin daily for three more weeks., Disp: 19 tablet, Rfl: 0 .  HYDROmorphone (DILAUDID) 2 MG tablet, Take 1-2 tablets (2-4 mg total) by mouth every 4 (four) hours as needed for severe pain. (Patient not taking: Reported on 10/05/2016), Disp: 80 tablet, Rfl: 0 .  iron polysaccharides (NIFEREX) 150 MG capsule, Take 1 capsule (150 mg total) by mouth 2 (two) times daily. (Patient not taking: Reported on 10/05/2016), Disp: 42  capsule, Rfl: 0 .  traMADol (ULTRAM) 50 MG tablet, Take 1-2 tablets (50-100 mg total) by mouth every 6 (six) hours as needed (mild pain). (Patient not taking: Reported on 10/05/2016), Disp: 80 tablet, Rfl: 1  EXAM:  Vitals:   10/05/16 1612  BP: 118/70  Pulse: 97  Temp: 99.6 F (37.6 C)    There is no height or weight on file to calculate BMI.  GENERAL: vitals reviewed and listed above, alert, oriented, appears well hydrated and in no acute distress  HEENT: atraumatic, conjunttiva clear, no obvious abnormalities on inspection of external nose and ears  NECK: no obvious masses on inspection  LUNGS: clear to auscultation bilaterally, no wheezes, rales or rhonchi, good air movement  CV: HRRR, no peripheral edema  ABD: BS+, soft, NTTP  RECTUM: numerous large hemorrhoids, irritated with no active bleeding currently  MS: moves all extremities without noticeable abnormality  PSYCH: pleasant and cooperative, no obvious depression or anxiety  ASSESSMENT AND PLAN:  Discussed the following assessment and plan:  Hemorrhoids, unspecified hemorrhoid type Other iron deficiency anemia BRBPR (bright red blood per rectum) - Plan: CBC with Differential/Platelets Diarrhea, unspecified type - Plan: C. difficile, PCR  -hold iron for now -check c. Diff -check hgb -advised sitz baths and topical treatment for hemorrhoids, bowel regimen if any constipation (not a problem currently)  -advised she call her gastroenterologist if recurrent, large amounts of bleeding or persistent issues -follow up in 1 week -offered to send in anusol - she reports has plenty of creams/etc at home but won't use them -Patient advised to return or notify a doctor immediately if symptoms worsen or persist or new concerns arise.  Patient Instructions  BEFORE YOU LEAVE: -follow up: schedule follow up with Dr. Sarajane Jews next week -labs  Hold off on the iron for now.  Call your gastroenterologist    Colin Benton R.,  DO

## 2016-10-06 LAB — CBC WITH DIFFERENTIAL/PLATELET
Basophils Absolute: 0.1 10*3/uL (ref 0.0–0.1)
Basophils Relative: 1.1 % (ref 0.0–3.0)
Eosinophils Absolute: 0.3 10*3/uL (ref 0.0–0.7)
Eosinophils Relative: 2.7 % (ref 0.0–5.0)
HCT: 34.2 % — ABNORMAL LOW (ref 36.0–46.0)
Hemoglobin: 11 g/dL — ABNORMAL LOW (ref 12.0–15.0)
Lymphocytes Relative: 25 % (ref 12.0–46.0)
Lymphs Abs: 2.4 10*3/uL (ref 0.7–4.0)
MCHC: 32 g/dL (ref 30.0–36.0)
MCV: 71.9 fl — ABNORMAL LOW (ref 78.0–100.0)
Monocytes Absolute: 1.4 10*3/uL — ABNORMAL HIGH (ref 0.1–1.0)
Monocytes Relative: 14.8 % — ABNORMAL HIGH (ref 3.0–12.0)
Neutro Abs: 5.4 10*3/uL (ref 1.4–7.7)
Neutrophils Relative %: 56.4 % (ref 43.0–77.0)
Platelets: 368 10*3/uL (ref 150.0–400.0)
RBC: 4.76 Mil/uL (ref 3.87–5.11)
RDW: 17.8 % — ABNORMAL HIGH (ref 11.5–15.5)
WBC: 9.6 10*3/uL (ref 4.0–10.5)

## 2016-10-09 LAB — CLOSTRIDIUM DIFFICILE BY PCR: Toxigenic C. Difficile by PCR: NOT DETECTED

## 2016-10-12 ENCOUNTER — Ambulatory Visit (INDEPENDENT_AMBULATORY_CARE_PROVIDER_SITE_OTHER): Payer: 59 | Admitting: Family Medicine

## 2016-10-12 ENCOUNTER — Encounter: Payer: Self-pay | Admitting: Family Medicine

## 2016-10-12 VITALS — BP 119/71 | HR 91 | Temp 99.1°F | Ht 67.0 in | Wt 203.0 lb

## 2016-10-12 DIAGNOSIS — N3000 Acute cystitis without hematuria: Secondary | ICD-10-CM | POA: Diagnosis not present

## 2016-10-12 LAB — POC URINALSYSI DIPSTICK (AUTOMATED)
Bilirubin, UA: NEGATIVE
Blood, UA: NEGATIVE
Glucose, UA: NEGATIVE
Ketones, UA: NEGATIVE
Spec Grav, UA: 1.02
Urobilinogen, UA: 0.2
pH, UA: 7

## 2016-10-12 MED ORDER — CEPHALEXIN 500 MG PO CAPS: 500.0000 mg | ORAL_CAPSULE | Freq: Three times a day (TID) | ORAL | 0 refills | Status: AC

## 2016-10-12 MED ORDER — PROCHLORPERAZINE MALEATE 10 MG PO TABS: 10.0000 mg | ORAL_TABLET | Freq: Four times a day (QID) | ORAL | 2 refills | Status: DC | PRN

## 2016-10-12 NOTE — Progress Notes (Signed)
Pre visit review using our clinic review tool, if applicable. No additional management support is needed unless otherwise documented below in the visit note. 

## 2016-10-12 NOTE — Progress Notes (Signed)
   Subjective:    Patient ID: Victoria Mosley, female    DOB: Feb 28, 1951, 65 y.o.   MRN: [id number]  HPI Here for an apparent UTI. She had successful hip surgery on 09-27-16, but she developed a UTI in the process. She was seen on 10-02-16 and was given Macrobid for 7 days. A urine culture was not obtained. She felt better for a few days but now for 2 days she symptoms have returned, including urgency and burning. No fever. She drinks plenty of fluids.    Review of Systems  Constitutional: Negative.   Respiratory: Negative.   Cardiovascular: Negative.   Gastrointestinal: Negative.   Genitourinary: Positive for dysuria, frequency and urgency. Negative for flank pain, hematuria and pelvic pain.       Objective:   Physical Exam  Constitutional: She appears well-developed and well-nourished.  Cardiovascular: Normal rate, regular rhythm, normal heart sounds and intact distal pulses.   Pulmonary/Chest: Effort normal and breath sounds normal.  Abdominal: Soft. Bowel sounds are normal. She exhibits no distension and no mass. There is no tenderness. There is no rebound and no guarding.          Assessment & Plan:  Recurrent UTI. We will culture today's sample. Given 7 days of Keflex. She also complained about intermittent nausea but Phenergan and Zofran do not help much. She will try Compazine 10 mg prn.  Laurey Morale, MD

## 2016-10-14 LAB — URINE CULTURE: Colony Count: >=100000

## 2016-10-25 ENCOUNTER — Ambulatory Visit (INDEPENDENT_AMBULATORY_CARE_PROVIDER_SITE_OTHER): Payer: 59 | Admitting: Family Medicine

## 2016-10-25 VITALS — BP 110/80 | HR 94 | Temp 98.0°F | Ht 67.0 in | Wt 202.5 lb

## 2016-10-25 DIAGNOSIS — D6489 Other specified anemias: Secondary | ICD-10-CM | POA: Diagnosis not present

## 2016-10-25 DIAGNOSIS — R110 Nausea: Secondary | ICD-10-CM | POA: Diagnosis not present

## 2016-10-25 LAB — CBC WITH DIFFERENTIAL/PLATELET
Basophils Absolute: 0 10*3/uL (ref 0.0–0.1)
Basophils Relative: 0.6 % (ref 0.0–3.0)
Eosinophils Absolute: 0.1 10*3/uL (ref 0.0–0.7)
Eosinophils Relative: 2 % (ref 0.0–5.0)
HCT: 32.8 % — ABNORMAL LOW (ref 36.0–46.0)
Hemoglobin: 10.2 g/dL — ABNORMAL LOW (ref 12.0–15.0)
Lymphocytes Relative: 31.3 % (ref 12.0–46.0)
Lymphs Abs: 1.7 10*3/uL (ref 0.7–4.0)
MCHC: 31.2 g/dL (ref 30.0–36.0)
MCV: 68.5 fl — ABNORMAL LOW (ref 78.0–100.0)
Monocytes Absolute: 0.8 10*3/uL (ref 0.1–1.0)
Monocytes Relative: 15.2 % — ABNORMAL HIGH (ref 3.0–12.0)
Neutro Abs: 2.7 10*3/uL (ref 1.4–7.7)
Neutrophils Relative %: 50.9 % (ref 43.0–77.0)
Platelets: 421 10*3/uL — ABNORMAL HIGH (ref 150.0–400.0)
RBC: 4.75 Mil/uL (ref 3.87–5.11)
RDW: 17.8 % — ABNORMAL HIGH (ref 11.5–15.5)
WBC: 5.4 10*3/uL (ref 4.0–10.5)

## 2016-10-25 LAB — BASIC METABOLIC PANEL
BUN: 19 mg/dL (ref 6–23)
CO2: 29 mEq/L (ref 19–32)
Calcium: 9.5 mg/dL (ref 8.4–10.5)
Chloride: 101 mEq/L (ref 96–112)
Creatinine, Ser: 0.78 mg/dL (ref 0.40–1.20)
GFR: 78.91 mL/min (ref >60.00)
Glucose, Bld: 121 mg/dL — ABNORMAL HIGH (ref 70–99)
Potassium: 3.8 mEq/L (ref 3.5–5.1)
Sodium: 142 mEq/L (ref 135–145)

## 2016-10-25 NOTE — Progress Notes (Signed)
Pre visit review using our clinic review tool, if applicable. No additional management support is needed unless otherwise documented below in the visit note. 

## 2016-10-25 NOTE — Progress Notes (Signed)
Subjective:     Patient ID: Victoria Mosley, female   DOB: 11-Nov-1950, 65 y.o.   MRN: [id number]  HPI Patient seen with chief complaint of nausea. She had left total hip replacement about a month ago. She has had prior issues with post anesthesia nausea. She had one episode of vomiting after surgery but has had no vomiting since then. She's had occasional intermittent diarrhea and constipation. Apparently her blood pressure dropped quite low after surgery down to 80/40 but her blood pressure has been stable since then. She is keeping down fluids. She took some iron after surgery with initial hemoglobin 8.9 but that made her feeling more nauseous. She also had nausea with Xarelto.  She takes HCTZ for kidney stone prevention. She has been much restricted in mobility because of nausea. No significant abdominal pain. Patient also requesting that we check her incision site. She had some very mild erythema near the superior portion. No warmth. Nontender. No fevers or chills. No dysuria. No cough. No pleuritic pain or chest pain. Denies reflux symptoms.  Past Medical History:  Diagnosis Date  . 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 Arthritis   . Bilateral knee pain    sees Dr. Wynelle Link   . Bilateral renal cysts   . Chronic UTI (urinary tract infection)   . Complication of anesthesia   . Cough 11/30/14    cough since 11/20/14- now productine- green with green mucus out of nose- no fever  . Diverticulitis   . GERD (gastroesophageal reflux disease)   . History of kidney stones   . History of melanoma excision   . History of renal calculi   . 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 Colposcopy inadequate/normal 04/2015  . Hyperlipidemia   . IBS (irritable bowel syndrome)   . IBS (irritable bowel syndrome)   . Kidney stones    bilateral  . Menopause    sees Dr. Phineas Real  . Migraines   . Nephrolithiasis    left  . Neurogenic bladder   . Normal  cardiac stress test 2012  . PONV (postoperative nausea and vomiting)   . Tendonitis    right ankle  . Urinary urgency   . UTI (urinary tract infection)    started atb on 03-24-12   Past Surgical History:  Procedure Laterality Date  . BACK SURGERY  10/1991   L5 ruptured disc- Dr.Robinson  . CESAREAN SECTION  12/1980  . COLONOSCOPY  03-28-12   per Dr. Henrene Pastor, polyp,  repeat in 5 yrs  . CYSTOSCOPY WITH RETROGRADE PYELOGRAM, URETEROSCOPY AND STENT PLACEMENT Left 12/25/2012   Procedure: CYSTOSCOPY WITH RETROGRADE PYELOGRAM, URETEROSCOPY, EXTRACTION OF  LEFT STONE WITH BASKETAND STENT PLACEMENT;  Surgeon: Molli Hazard, MD;  Location: [facility];  Service: Urology;  Laterality: Left;  2.5 HRS   . FOOT SURGERY Bilateral 1987   per Dr. Alphonzo Cruise  . HEMATOMA EVACUATION  07/08/2012   Procedure: EVACUATION HEMATOMA;  Surgeon: Kristeen Miss, MD;  Location: [facility];  Service: Neurosurgery;  Laterality: N/A;  Cervical Seven-thoracic One Laminectomy and Decompression of Epidural Hematoma  . KIDNEY STONE SURGERY  2005   per Dr. Reece Agar  . LEEP  07/2015   final pathology LGSIL with clear margins negative ECC  . LITHOTRIPSY  2007   x 3  . [place], at L5 per Dr. Quentin Cornwall  . MELANOMA EXCISION  2007   left upper arm per dr. Teressa Senter  . NASAL SEPTUM SURGERY  1992   per Dr. Cynda Familia  . POSTERIOR CERVICAL LAMINECTOMY  07/08/2012   Procedure: POSTERIOR CERVICAL LAMINECTOMY;  Surgeon: Kristeen Miss, MD;  Location: [facility];  Service: Neurosurgery;  Laterality: N/A;  Cervical Seven- thoracic One Laminectomy and Decompression of Epidural Hematoma  . TOTAL HIP ARTHROPLASTY Right 2010   [place]  . TOTAL HIP ARTHROPLASTY Left 09/27/2016   Procedure: LEFT TOTAL HIP ARTHROPLASTY ANTERIOR APPROACH;  Surgeon: Gaynelle Arabian, MD;  Location: [facility];  Service: Orthopedics;  Laterality: Left;  . TOTAL KNEE ARTHROPLASTY Right 12/07/2014   Procedure: RIGHT TOTAL KNEE  ARTHROPLASTY;  Surgeon: [name], MD;  Location: [facility];  Service: Orthopedics;  Laterality: Right;  . TUBAL LIGATION  01/1988    reports that she quit smoking about 17 years ago. She smoked 0.25 packs per day. She has never used smokeless tobacco. She reports that she does not drink alcohol or use drugs. family history includes Bladder Cancer in her sister; Diabetes in her mother; Heart disease in her mother and sister; Hypertension in her father, mother, and sister; Leukemia in her father; Stomach cancer in her paternal grandmother. Allergies  Allergen Reactions  . Levaquin [Levofloxacin In D5w] Anaphylaxis, Rash and Other (See Comments)    Hot, dizziness, diarrhea  . Pravastatin Sodium [Pravachol]     Joint pain(severe)  . Biaxin [Clarithromycin] Nausea And Vomiting  . Ciprofloxacin Nausea Only  . Clarithromycin Nausea Only  . Dust Mite Extract Other (See Comments)      Grass, trees also- cause Runny nose, sneezing (allergy symptoms)  . Septra [Sulfamethoxazole-Trimethoprim]     diarrhea     Review of Systems  Constitutional: Negative for fatigue.  HENT: Positive for congestion.   Eyes: Negative for visual disturbance.  Respiratory: Negative for cough, chest tightness, shortness of breath and wheezing.   Cardiovascular: Negative for chest pain, palpitations and leg swelling.  Gastrointestinal: Positive for nausea. Negative for abdominal distention, abdominal pain and vomiting.  Genitourinary: Negative for dysuria.  Neurological: Negative for dizziness, seizures, syncope, weakness, light-headedness and headaches.       Objective:   Physical Exam  Constitutional: She appears well-developed and well-nourished.  HENT:  Right Ear: External ear normal.  Left Ear: External ear normal.  Mouth/Throat: Oropharynx is clear and moist.  Neck: Neck supple. No thyromegaly present.  Cardiovascular: Normal rate and regular rhythm.   Pulmonary/Chest: Effort normal and breath sounds  normal. No respiratory distress. She has no wheezes. She has no rales.  Abdominal: Soft. There is no tenderness.  Musculoskeletal: She exhibits no edema.  Skin:  Incision site looks good from her recent left total hip replacement. No signs of secondary infection. She has some very minimal erythema in the superior portion and this is near fat fold. There is no signs of cellulitis though. This is nontender. No warmth. No fluctuance       Assessment:     #1 nausea without vomiting following recent surgery. Question is whether this is postanesthesia related versus other. She is on HCTZ and we need to rule out electrolyte disturbance such as hyponatremia especially in view of her decreased intake  #2 postsurgical anemia intolerant of iron    Plan:     -Check labs - CBC and basic metabolic panel -Continue with Compazine as needed for severe nausea symptoms -Continue with frequent small meals and low fat content  Eulas Post MD [facility]

## 2016-10-25 NOTE — Patient Instructions (Signed)
Nausea, Adult  Nausea is the feeling of an upset stomach or having to vomit. Nausea on its own is not usually a serious concern, but it may be an early sign of a more serious medical problem. As nausea gets worse, it can lead to vomiting. If vomiting develops, or if you are not able to drink enough fluids, you are at risk of becoming dehydrated. Dehydration can make you tired and thirsty, cause you to have a dry mouth, and decrease how often you urinate. Older adults and people with other diseases or a weak immune system are at higher risk for dehydration. The main goals of treating your nausea are:  · To limit repeated nausea episodes.  · To prevent vomiting and dehydration.    Follow these instructions at home:  Follow instructions from your health care provider about how to care for yourself at home.  Eating and drinking   Follow these recommendations as told by your health care provider:  · Take an oral rehydration solution (ORS). This is a drink that is sold at pharmacies and retail stores.  · Drink clear fluids in small amounts as you are able. Clear fluids include water, ice chips, diluted fruit juice, and low-calorie sports drinks.  · Eat bland, easy-to-digest foods in small amounts as you are able. These foods include bananas, applesauce, rice, lean meats, toast, and crackers.  · Avoid drinking fluids that contain a lot of sugar or caffeine, such as energy drinks, sports drinks, and soda.  · Avoid alcohol.  · Avoid spicy or fatty foods.    General instructions   · Drink enough fluid to keep your urine clear or pale yellow.  · Wash your hands often. If soap and water are not available, use hand sanitizer.  · Make sure that all people in your household wash their hands well and often.  · Rest at home while you recover.  · Take over-the-counter and prescription medicines only as told by your health care provider.  · Breathe slowly and deeply when you feel nauseous.  · Watch your condition for any  changes.  · Keep all follow-up visits as told by your health care provider. This is important.  Contact a health care provider if:  · You have a headache.  · You have new symptoms.  · Your nausea gets worse.  · You have a fever.  · You feel light-headed or dizzy.  · You vomit.  · You cannot keep fluids down.  Get help right away if:  · You have pain in your chest, neck, arm, or jaw.  · You feel extremely weak or you faint.  · You have vomit that is bright red or looks like coffee grounds.  · You have bloody or black stools or stools that look like tar.  · You have a severe headache, a stiff neck, or both.  · You have severe pain, cramping, or bloating in your abdomen.  · You have a rash.  · You have difficulty breathing or are breathing very quickly.  · Your heart is beating very quickly.  · Your skin feels cold and clammy.  · You feel confused.  · You have pain when you urinate.  · You have signs of dehydration, such as:  ? Dark urine, very little, or no urine.  ? Cracked lips.  ? Dry mouth.  ? Sunken eyes.  ? Sleepiness.  ? Weakness.  These symptoms may represent a serious problem that is an emergency. Do   not wait to see if the symptoms will go away. Get medical help right away. Call your local emergency services (911 in the [place] Do not drive yourself to the hospital.  This information is not intended to replace advice given to you by your health care provider. Make sure you discuss any questions you have with your health care provider.  Document Released: 11/23/2004 Document Revised: 03/20/2016 Document Reviewed: 06/22/2015  Elsevier Interactive Patient Education © 2017 Elsevier Inc.

## 2016-11-03 ENCOUNTER — Other Ambulatory Visit: Payer: Self-pay

## 2016-11-03 MED ORDER — MEDROXYPROGESTERONE ACETATE 2.5 MG PO TABS: 2.5000 mg | ORAL_TABLET | Freq: Every day | ORAL | 2 refills | Status: DC

## 2016-11-03 MED ORDER — ESTROPIPATE 0.75 MG PO TABS: 0.7500 mg | ORAL_TABLET | Freq: Every day | ORAL | 2 refills | Status: DC

## 2016-11-07 ENCOUNTER — Telehealth: Payer: Self-pay | Admitting: Family Medicine

## 2016-11-07 NOTE — Telephone Encounter (Signed)
I did print out a letter for this so please fax it to the number provided. As for referral to Infectious Disease, they do not deal with sinusitis or UTIs. I would be happy to refer her to ENT and to Urology for these if she wishes

## 2016-11-07 NOTE — Telephone Encounter (Signed)
Pt needs a letter of medical necessity that she needs new brand fioricet not generic and that she has tried generic and it did not work. Victoria Mosley [phone] and fax [phone]. Please would like a referral to infection disease doctor for chronic sinusitis and UTI's

## 2016-11-08 ENCOUNTER — Other Ambulatory Visit: Payer: Self-pay | Admitting: Family Medicine

## 2016-11-08 NOTE — Telephone Encounter (Signed)
I faxed note to below number and spoke with pt. She already has a [facility] and will follow up with them.

## 2016-11-08 NOTE — Telephone Encounter (Signed)
Refill request for Ezetimbe 10 mg, Propranolol 40 mg, HCTZ 25 mg, Omeprazole 20 mg and a 90 day supply to [facility]. Pt recently changed mail order pharmacies.

## 2016-11-09 ENCOUNTER — Other Ambulatory Visit: Payer: Self-pay | Admitting: Family Medicine

## 2016-11-09 MED ORDER — HYDROCHLOROTHIAZIDE 25 MG PO TABS: 25.0000 mg | ORAL_TABLET | Freq: Every day | ORAL | 3 refills | Status: DC

## 2016-11-09 MED ORDER — EZETIMIBE 10 MG PO TABS: 10.0000 mg | ORAL_TABLET | Freq: Every day | ORAL | 3 refills | Status: DC

## 2016-11-09 MED ORDER — OMEPRAZOLE 20 MG PO CPDR: 20.0000 mg | DELAYED_RELEASE_CAPSULE | Freq: Every day | ORAL | 3 refills | Status: DC

## 2016-11-09 MED ORDER — PROPRANOLOL HCL 40 MG PO TABS: 40.0000 mg | ORAL_TABLET | Freq: Every day | ORAL | 3 refills | Status: DC

## 2016-11-09 NOTE — Telephone Encounter (Signed)
All 4 scripts were printed and faxed to [facility].

## 2016-11-10 ENCOUNTER — Telehealth: Payer: Self-pay | Admitting: Family Medicine

## 2016-11-10 NOTE — Telephone Encounter (Signed)
Pt states [facility] has sent a form to be filled out by them that will take care of her Rx fioricet  Pt states they will not accept the letter you wrote.  They now need a prior.

## 2016-11-10 NOTE — Telephone Encounter (Signed)
We will take care of this when it comes

## 2016-11-13 ENCOUNTER — Telehealth: Payer: Self-pay

## 2016-11-13 NOTE — Telephone Encounter (Signed)
Received form from insurance company for the [place]. PA form filled out and faxed back to insurance.

## 2016-11-21 ENCOUNTER — Encounter: Payer: Self-pay | Admitting: Family Medicine

## 2016-11-21 ENCOUNTER — Ambulatory Visit (INDEPENDENT_AMBULATORY_CARE_PROVIDER_SITE_OTHER): Payer: 59 | Admitting: Family Medicine

## 2016-11-21 VITALS — BP 138/85 | HR 94 | Temp 99.2°F | Ht 67.0 in | Wt 198.0 lb

## 2016-11-21 DIAGNOSIS — K5732 Diverticulitis of large intestine without perforation or abscess without bleeding: Secondary | ICD-10-CM | POA: Diagnosis not present

## 2016-11-21 MED ORDER — FLUCONAZOLE 150 MG PO TABS: 150.0000 mg | ORAL_TABLET | Freq: Once | ORAL | 11 refills | Status: AC

## 2016-11-21 MED ORDER — BUTALBITAL-APAP-CAFFEINE 50-300-40 MG PO CAPS: 1.0000 | ORAL_CAPSULE | Freq: Two times a day (BID) | ORAL | 2 refills | Status: DC | PRN

## 2016-11-21 MED ORDER — PREDNISOLONE 5 MG PO TABS: 5.0000 mg | ORAL_TABLET | Freq: Every day | ORAL | 2 refills | Status: DC | PRN

## 2016-11-21 MED ORDER — METRONIDAZOLE 500 MG PO TABS: 500.0000 mg | ORAL_TABLET | Freq: Three times a day (TID) | ORAL | 0 refills | Status: DC

## 2016-11-21 NOTE — Progress Notes (Signed)
   Subjective:    Patient ID: Victoria Mosley, female    DOB: [birth year], 67 y.o.   MRN: [id number]  HPI Here for 3 weeks of intermittent mild LLQ abdominal pains. She was treated for a UTI in December with Keflex and apparently this resolved. She is currently being treated by Dr. Janace Hoard (ENT) with Clindamycin 300 mg TID for a sinus infection. He did an aspiration biopsy and this grew a "Staph infection" per Olin Hauser. She has been taking Clindamycin for about a week. Her BMs and urinations are normal. No fever or nausea.    Review of Systems  Constitutional: Negative.   HENT: Positive for postnasal drip, sinus pain and sinus pressure.   Respiratory: Negative.   Cardiovascular: Negative.   Gastrointestinal: Positive for abdominal pain. Negative for abdominal distention, anal bleeding, blood in stool, constipation, diarrhea, nausea, rectal pain and vomiting.  Genitourinary: Negative.   Neurological: Negative.        Objective:   Physical Exam  Constitutional: She appears well-developed and well-nourished. No distress.  Cardiovascular: Normal rate, regular rhythm, normal heart sounds and intact distal pulses.   Pulmonary/Chest: Effort normal and breath sounds normal.  Abdominal: Soft. Bowel sounds are normal. She exhibits no distension and no mass. There is no rebound and no guarding.  Mildly tender in the LLQ          Assessment & Plan:  This is likely a diverticulitis. She is already taking Clindamycin for a 14 day course. We will add Flagyl 500 mg TID to this. Recheck prn.  Alysia Penna, MD

## 2016-11-21 NOTE — Telephone Encounter (Signed)
I spoke with pt and she is going to pay out of pocket for this medication.

## 2016-11-21 NOTE — Telephone Encounter (Signed)
PA denied.

## 2016-11-21 NOTE — Progress Notes (Signed)
Pre visit review using our clinic review tool, if applicable. No additional management support is needed unless otherwise documented below in the visit note. 

## 2016-11-22 ENCOUNTER — Telehealth: Payer: Self-pay | Admitting: Family Medicine

## 2016-11-22 MED ORDER — PREDNISOLONE 15 MG/5ML PO SOLN: 15.0000 mg | Freq: Every day | ORAL | 2 refills | Status: DC | PRN

## 2016-11-22 MED ORDER — PREDNISONE 5 MG PO TABS: 5.0000 mg | ORAL_TABLET | Freq: Every day | ORAL | 2 refills | Status: DC

## 2016-11-22 NOTE — Telephone Encounter (Signed)
Can print another script for liquid form?

## 2016-11-22 NOTE — Telephone Encounter (Signed)
I sent script e-scribe, spoke with pharmacy to cancel previous order and went over this information with pt.

## 2016-11-22 NOTE — Telephone Encounter (Signed)
I spoke with pt and she requested Prednisone 5 mg and I removed the Prelone from current medication list. Also need to call pharmacy and cancel previous order.

## 2016-11-22 NOTE — Telephone Encounter (Signed)
Change this to Prednisone 5 mg to take 1-2 tabs daily as needed for mouth ulcers, #60 with 2 rf

## 2016-11-22 NOTE — Telephone Encounter (Signed)
Pharmacy called for pt to advise a refill of prednisoLONE 5 MG TABS tablet  [facility] be done [facility] only [facility] a [facility]. Would like a call back to advise if ok to change to liquid,

## 2016-11-22 NOTE — Telephone Encounter (Signed)
Ready to fax  

## 2016-12-04 ENCOUNTER — Other Ambulatory Visit: Payer: Self-pay | Admitting: Family Medicine

## 2016-12-19 ENCOUNTER — Other Ambulatory Visit: Payer: Self-pay | Admitting: Family Medicine

## 2016-12-19 MED ORDER — FLUCONAZOLE 150 MG PO TABS: ORAL_TABLET | ORAL | 11 refills | Status: DC

## 2016-12-21 ENCOUNTER — Encounter: Payer: Self-pay | Admitting: Gynecology

## 2016-12-26 ENCOUNTER — Telehealth: Payer: Self-pay | Admitting: Family Medicine

## 2016-12-26 DIAGNOSIS — R7989 Other specified abnormal findings of blood chemistry: Secondary | ICD-10-CM

## 2016-12-26 DIAGNOSIS — R739 Hyperglycemia, unspecified: Secondary | ICD-10-CM

## 2016-12-26 NOTE — Telephone Encounter (Signed)
I put in all these orders

## 2016-12-26 NOTE — Telephone Encounter (Signed)
Pt states she had a full panel done at her employer ([employer]).  Several of the numbers were out of line, especially her thyroid (5.5) and hemoglobin 9.1.  The on site physician recommended she do another full panel at her pcp.  Pt left the results at home today or would have faxed to me.  Pt would like to have an order to have all cpx labs and A1c redone here.  OK to schedule?

## 2016-12-27 ENCOUNTER — Other Ambulatory Visit: Payer: Self-pay | Admitting: Family Medicine

## 2016-12-27 DIAGNOSIS — R350 Frequency of micturition: Secondary | ICD-10-CM

## 2016-12-27 NOTE — Telephone Encounter (Signed)
I put in future order for the UA.

## 2016-12-27 NOTE — Telephone Encounter (Signed)
Pt would like to have a urine check done when she comes in for her labs on Friday she thinks that she has a UTI.  Pt declines a lab visit.  Pt is scheduled.

## 2016-12-29 ENCOUNTER — Other Ambulatory Visit (INDEPENDENT_AMBULATORY_CARE_PROVIDER_SITE_OTHER): Payer: 59

## 2016-12-29 ENCOUNTER — Telehealth: Payer: Self-pay | Admitting: Family Medicine

## 2016-12-29 DIAGNOSIS — R739 Hyperglycemia, unspecified: Secondary | ICD-10-CM

## 2016-12-29 DIAGNOSIS — R350 Frequency of micturition: Secondary | ICD-10-CM

## 2016-12-29 DIAGNOSIS — R946 Abnormal results of thyroid function studies: Secondary | ICD-10-CM | POA: Diagnosis not present

## 2016-12-29 DIAGNOSIS — R7989 Other specified abnormal findings of blood chemistry: Secondary | ICD-10-CM

## 2016-12-29 LAB — HEPATIC FUNCTION PANEL
ALT: 14 U/L (ref 0–35)
AST: 17 U/L (ref 0–37)
Albumin: 3.9 g/dL (ref 3.5–5.2)
Alkaline Phosphatase: 60 U/L (ref 39–117)
Bilirubin, Direct: 0.1 mg/dL (ref 0.0–0.3)
Total Bilirubin: 0.3 mg/dL (ref 0.2–1.2)
Total Protein: 6.7 g/dL (ref 6.0–8.3)

## 2016-12-29 LAB — BASIC METABOLIC PANEL
BUN: 19 mg/dL (ref 6–23)
CO2: 29 mEq/L (ref 19–32)
Calcium: 9.2 mg/dL (ref 8.4–10.5)
Chloride: 102 mEq/L (ref 96–112)
Creatinine, Ser: 0.67 mg/dL (ref 0.40–1.20)
GFR: 93.99 mL/min (ref >60.00)
Glucose, Bld: 104 mg/dL — ABNORMAL HIGH (ref 70–99)
Potassium: 3.9 mEq/L (ref 3.5–5.1)
Sodium: 140 mEq/L (ref 135–145)

## 2016-12-29 LAB — CBC WITH DIFFERENTIAL/PLATELET
Basophils Absolute: 0 10*3/uL (ref 0.0–0.1)
Basophils Relative: 0.3 % (ref 0.0–3.0)
Eosinophils Absolute: 0.1 10*3/uL (ref 0.0–0.7)
Eosinophils Relative: 0.8 % (ref 0.0–5.0)
HCT: 31.7 % — ABNORMAL LOW (ref 36.0–46.0)
Hemoglobin: 9.5 g/dL — ABNORMAL LOW (ref 12.0–15.0)
Lymphocytes Relative: 26.1 % (ref 12.0–46.0)
Lymphs Abs: 1.6 10*3/uL (ref 0.7–4.0)
MCHC: 30.2 g/dL (ref 30.0–36.0)
MCV: 63.8 fl — ABNORMAL LOW (ref 78.0–100.0)
Monocytes Absolute: 0.9 10*3/uL (ref 0.1–1.0)
Monocytes Relative: 14.1 % — ABNORMAL HIGH (ref 3.0–12.0)
Neutro Abs: 3.7 10*3/uL (ref 1.4–7.7)
Neutrophils Relative %: 58.7 % (ref 43.0–77.0)
Platelets: 389 10*3/uL (ref 150.0–400.0)
RBC: 4.96 Mil/uL (ref 3.87–5.11)
RDW: 20.2 % — ABNORMAL HIGH (ref 11.5–15.5)
WBC: 6.3 10*3/uL (ref 4.0–10.5)

## 2016-12-29 LAB — URINALYSIS, ROUTINE W REFLEX MICROSCOPIC
Bilirubin Urine: NEGATIVE
Ketones, ur: NEGATIVE
Nitrite: POSITIVE — AB
RBC / HPF: NONE SEEN (ref 0–?)
Specific Gravity, Urine: 1.01 (ref 1.000–1.030)
Total Protein, Urine: NEGATIVE
Urine Glucose: NEGATIVE
Urobilinogen, UA: 0.2 (ref 0.0–1.0)
pH: 7.5 (ref 5.0–8.0)

## 2016-12-29 LAB — HEMOGLOBIN A1C: Hgb A1c MFr Bld: 6.8 % — ABNORMAL HIGH (ref 4.6–6.5)

## 2016-12-29 LAB — TSH: TSH: 3.72 u[IU]/mL (ref 0.35–4.50)

## 2016-12-29 LAB — LIPID PANEL
Cholesterol: 158 mg/dL (ref 0–200)
HDL: 34.4 mg/dL — ABNORMAL LOW (ref >39.00)
LDL Cholesterol: 88 mg/dL (ref 0–99)
NonHDL: 123.15
Total CHOL/HDL Ratio: 5
Triglycerides: 174 mg/dL — ABNORMAL HIGH (ref 0.0–149.0)
VLDL: 34.8 mg/dL (ref 0.0–40.0)

## 2016-12-29 LAB — T3, FREE: T3, Free: 3.7 pg/mL (ref 2.3–4.2)

## 2016-12-29 LAB — T4, FREE: Free T4: 0.84 ng/dL (ref 0.60–1.60)

## 2016-12-29 MED ORDER — NITROFURANTOIN MONOHYD MACRO 100 MG PO CAPS: 100.0000 mg | ORAL_CAPSULE | Freq: Two times a day (BID) | ORAL | 0 refills | Status: DC

## 2016-12-29 NOTE — Telephone Encounter (Signed)
° ° ° ° °  Pt call back and I let her know what Dr Sarajane Jews siad and she ask that the rx be called into [facility]

## 2016-12-29 NOTE — Telephone Encounter (Signed)
Pt does not want to wait until her appt on thurs for her results. Pt thinks she has uti. Please call pt

## 2016-12-29 NOTE — Telephone Encounter (Signed)
[facility] and [facility]  

## 2016-12-29 NOTE — Telephone Encounter (Signed)
Pt scheduled  

## 2016-12-29 NOTE — Telephone Encounter (Signed)
Can you schedule pt to schedule a office visit, Dr. Sarajane Jews would like to discuss recent lab results.

## 2016-12-29 NOTE — Telephone Encounter (Signed)
Go ahead and call in Macrobid 100 mg bid for 7 days

## 2016-12-29 NOTE — Addendum Note (Signed)
Addended by: Aggie Hacker A on: 12/29/2016 04:55 PM   Modules accepted: Orders

## 2016-12-29 NOTE — Telephone Encounter (Signed)
This is a duplicate note, see previous one.  

## 2017-01-04 ENCOUNTER — Ambulatory Visit (INDEPENDENT_AMBULATORY_CARE_PROVIDER_SITE_OTHER): Payer: 59 | Admitting: Family Medicine

## 2017-01-04 ENCOUNTER — Encounter: Payer: Self-pay | Admitting: Family Medicine

## 2017-01-04 VITALS — BP 128/72 | Temp 98.4°F | Ht 67.0 in | Wt 193.0 lb

## 2017-01-04 DIAGNOSIS — R7303 Prediabetes: Secondary | ICD-10-CM

## 2017-01-04 DIAGNOSIS — D649 Anemia, unspecified: Secondary | ICD-10-CM

## 2017-01-04 NOTE — Progress Notes (Signed)
   Subjective:    Patient ID: Victoria Mosley, female    DOB: Jul 06, 1951, 66 y.o.   MRN: [id number]  HPI Here to go over recent lab results. She feels fine in general. She has some labs done through a wellness program at her job, and her Hgb was low at 9.9 on 12-06-16. We repeated these labs here on 12-30-16 and it was down to 9.5 (it was 11.0 a few months ago). She has had trouble with hemorrhoids but has not seen any obvious blood in the stools. Her TSH was high at 5.5, but we repeated this and also got a free T3 and free T4, and these were all normal. She had a borderline Hgb A1c at 6.3 and we got a 6.8. Otherwise the two sets of results were in agreement. Since then she has changed her diet by cutting out all sugars and by reducing her carbohydrate intake. Her last colonoscopy with Dr. Henrene Pastor was in May of 2013 and she will be due again later this spring.    Review of Systems  Constitutional: Negative.   Respiratory: Negative.   Cardiovascular: Negative.   Gastrointestinal: Negative.   Genitourinary: Negative for decreased urine volume, difficulty urinating, dyspareunia, dysuria, enuresis, flank pain, frequency, hematuria, pelvic pain and urgency.  Skin: Negative.        Objective:   Physical Exam  Constitutional: She appears well-developed and well-nourished.  Neck: No thyromegaly present.  Cardiovascular: Normal rate, regular rhythm, normal heart sounds and intact distal pulses.   Pulmonary/Chest: Effort normal and breath sounds normal.  Abdominal: Soft. Bowel sounds are normal. She exhibits no distension and no mass. There is no tenderness. There is no rebound and no guarding.  Lymphadenopathy:    She has no cervical adenopathy.          Assessment & Plan:  She has borderline glucose control and I encouraged her to follow a low carb diet. We will repeat an A1c in 90 days. I reassured her that her thyroid levels are fine. Her Hgb does seem to be steadily dropping and I worry about  possible GI blood losses. We will set up a consult with Dr. Henrene Pastor soon to evaluate this. My guess is he will opt to perform both upper and lower endoscopies soon.  Alysia Penna, MD

## 2017-01-04 NOTE — Patient Instructions (Signed)
WE NOW OFFER   [facility] FAST TRACK!!!  SAME DAY Appointments for ACUTE CARE  Such as: Sprains, Injuries, cuts, abrasions, rashes, muscle pain, joint pain, back pain Colds, flu, sore throats, headache, allergies, cough, fever  Ear pain, sinus and eye infections Abdominal pain, nausea, vomiting, diarrhea, upset stomach Animal/insect bites  3 Easy Ways to Schedule: Walk-In Scheduling Call in scheduling Mychart Sign-up: [email or web address]         

## 2017-01-04 NOTE — Progress Notes (Signed)
Pre visit review using our clinic review tool, if applicable. No additional management support is needed unless otherwise documented below in the visit note. 

## 2017-01-15 ENCOUNTER — Ambulatory Visit (INDEPENDENT_AMBULATORY_CARE_PROVIDER_SITE_OTHER): Payer: 59 | Admitting: Physician Assistant

## 2017-01-15 ENCOUNTER — Encounter: Payer: Self-pay | Admitting: Physician Assistant

## 2017-01-15 ENCOUNTER — Other Ambulatory Visit (INDEPENDENT_AMBULATORY_CARE_PROVIDER_SITE_OTHER): Payer: 59

## 2017-01-15 VITALS — BP 120/70 | HR 78 | Ht 67.0 in | Wt 195.0 lb

## 2017-01-15 DIAGNOSIS — K649 Unspecified hemorrhoids: Secondary | ICD-10-CM

## 2017-01-15 DIAGNOSIS — D509 Iron deficiency anemia, unspecified: Secondary | ICD-10-CM

## 2017-01-15 DIAGNOSIS — K625 Hemorrhage of anus and rectum: Secondary | ICD-10-CM | POA: Diagnosis not present

## 2017-01-15 DIAGNOSIS — Z86010 Personal history of colonic polyps: Secondary | ICD-10-CM | POA: Diagnosis not present

## 2017-01-15 LAB — CBC WITH DIFFERENTIAL/PLATELET
Basophils Absolute: 0 10*3/uL (ref 0.0–0.1)
Basophils Relative: 0.4 % (ref 0.0–3.0)
Eosinophils Absolute: 0.1 10*3/uL (ref 0.0–0.7)
Eosinophils Relative: 0.8 % (ref 0.0–5.0)
HCT: 34.5 % — ABNORMAL LOW (ref 36.0–46.0)
Hemoglobin: 10.5 g/dL — ABNORMAL LOW (ref 12.0–15.0)
Lymphocytes Relative: 26.9 % (ref 12.0–46.0)
Lymphs Abs: 2.2 10*3/uL (ref 0.7–4.0)
MCHC: 30.3 g/dL (ref 30.0–36.0)
MCV: 63.2 fl — ABNORMAL LOW (ref 78.0–100.0)
Monocytes Absolute: 1 10*3/uL (ref 0.1–1.0)
Monocytes Relative: 12.8 % — ABNORMAL HIGH (ref 3.0–12.0)
Neutro Abs: 4.9 10*3/uL (ref 1.4–7.7)
Neutrophils Relative %: 59.1 % (ref 43.0–77.0)
Platelets: 309 10*3/uL (ref 150.0–400.0)
RBC: 5.46 Mil/uL — ABNORMAL HIGH (ref 3.87–5.11)
RDW: 21.7 % — ABNORMAL HIGH (ref 11.5–15.5)
WBC: 8.2 10*3/uL (ref 4.0–10.5)

## 2017-01-15 LAB — IBC PANEL
Iron: 22 ug/dL — ABNORMAL LOW (ref 42–145)
Saturation Ratios: 3.9 % — ABNORMAL LOW (ref 20.0–50.0)
Transferrin: 408 mg/dL — ABNORMAL HIGH (ref 212.0–360.0)

## 2017-01-15 LAB — FERRITIN: Ferritin: 5.3 ng/mL — ABNORMAL LOW (ref 10.0–291.0)

## 2017-01-15 MED ORDER — NA SULFATE-K SULFATE-MG SULF 17.5-3.13-1.6 GM/177ML PO SOLN: 1.0000 | Freq: Once | ORAL | 0 refills | Status: AC

## 2017-01-15 NOTE — Patient Instructions (Addendum)
Your physician has requested that you go to the basement for lab work before leaving today   Decrease your NSAID use and continue Prilosec daily  You have been scheduled for an endoscopy and colonoscopy. Please follow the written instructions given to you at your visit today. Please pick up your prep supplies at the pharmacy within the next 1-3 days. If you use inhalers (even only as needed), please bring them with you on the day of your procedure. Your physician has requested that you go to [email or web address] and enter the access code given to you at your visit today. This web site gives a general overview about your procedure. However, you should still follow specific instructions given to you by our office regarding your preparation for the procedure.

## 2017-01-15 NOTE — Progress Notes (Signed)
Subjective:    Patient ID: Victoria Mosley, female    DOB: 10/02/1951, 66 y.o.   MRN: [id number]  HPI Victoria Mosley is a pleasant 66 year old white female known to Dr. Henrene Pastor who is referred today by Dr. Sarajane Jews for evaluation of microcytic anemia and complaints of rectal bleeding. Patient has history of IBS, GERD, adenomatous colon polyps, neurogenic bladder, and is status post bilateral hip replacements the last was done in November 2017. Since fall of 2017 she has had a steady decline in hemoglobin from the 12 range down to 9.5 on 12/29/2016. MCV has also dropped from 75-63. Patient last had colonoscopy in May 2013 for history of adenomatous polyps. She was noted to have moderate pan diverticulosis and internal hemorrhoids she had one diminutive polyp which was removed but no tissue for past. She was recommended to have follow-up in 5 years. EGD had been done in 2004 with finding of GERD, no Barrett's. Patient says she has been using NSAIDs on a regular basis over the past multiple months due to her orthopedic issues. She said she had been taking 2 Aleve twice daily regularly and a month or so ago switched to 4 Advil twice daily .She is on Tapazole 20 mg once daily chronically. She had also briefly been anticoagulated for about 2 weeks after her hip replacement. She denies any dysphagia or odynophagia. She had been having a lot of nausea immediately after her hip surgery but that has subsided. She says she lost about 20 pounds during that timeframe. She has also been having some recent rectal bleeding area she says over the past 6 days she has been noticing red blood in the commode with each bowel movement and blood  on the tissue. Her stools have been brown and normal appearing. She has had some rectal pressure and discomfort which she attributes to hemorrhoids.  She states she did have prior hemorrhoidal banding done by Dr. Rosendo Gros 2 in 2016. She has had intermittent small-volume bleeding off and on since  then.  Review of Systems Pertinent positive and negative review of systems were noted in the above HPI section.  All other review of systems was otherwise negative.  Outpatient Encounter Prescriptions as of 01/15/2017  Medication Sig  . acetaminophen (TYLENOL) 500 MG tablet Take 1,000 mg by mouth every 6 (six) hours as needed for mild pain.  . Butalbital-APAP-Caffeine 50-300-40 MG CAPS Take 1 capsule by mouth 2 (two) times daily as needed (headache).  . Cranberry 500 MG TABS Take by mouth 2 (two) times daily.  . diazepam (VALIUM) 5 MG tablet Take 1 tablet (5 mg total) by mouth every 6 (six) hours as needed for muscle spasms.  . diphenhydrAMINE (BENADRYL) 25 mg capsule Take 25 mg by mouth at bedtime.  Marland Kitchen estropipate (OGEN) 0.75 MG tablet Take 1 tablet (0.75 mg total) by mouth daily.  Marland Kitchen ezetimibe (ZETIA) 10 MG tablet Take 1 tablet (10 mg total) by mouth daily.  . fluconazole (DIFLUCAN) 150 MG tablet TAKE 1 TABLET (150 MG TOTAL) BY MOUTH ONCE FOR 1 DOSE.  . hydrochlorothiazide (HYDRODIURIL) 25 MG tablet Take 1 tablet (25 mg total) by mouth daily.  . hyoscyamine (LEVSIN SL) 0.125 MG SL tablet Place 1 tab on the tongue to dissolve at the at the onset of cramping, spasms then every 4 hours as needed. (Patient taking differently: Take 0.125 mg by mouth every 4 (four) hours as needed for cramping. Place 1 tab on the tongue to dissolve at the at the onset  of cramping, spasms then every 4 hours as needed.)  . loratadine (CLARITIN) 10 MG tablet Take 10 mg by mouth daily.   . medroxyPROGESTERone (PROVERA) 2.5 MG tablet Take 1 tablet (2.5 mg total) by mouth daily.  . naproxen sodium (ANAPROX) 220 MG tablet Take 220 mg by mouth 4 (four) times daily as needed.  Marland Kitchen omeprazole (PRILOSEC) 20 MG capsule Take 1 capsule (20 mg total) by mouth daily.  . polyethylene glycol (MIRALAX / GLYCOLAX) packet Take 17 g by mouth daily. States takes 1/3 packet at bedtime  . potassium gluconate 595 MG TABS Take 1,190 mg by mouth  every evening.   . predniSONE (DELTASONE) 5 MG tablet Take 1-2 tablets (5-10 mg total) by mouth daily with breakfast. ( for mouth ulcers )  . Probiotic Product (PROBIOTIC PO) Take by mouth. Takes 37m once daily.  . propranolol (INDERAL) 40 MG tablet Take 1 tablet (40 mg total) by mouth daily.  . Na Sulfate-K Sulfate-Mg Sulf 17.5-3.13-1.6 GM/180ML SOLN Take 1 kit by mouth once.  . [DISCONTINUED] nitrofurantoin, macrocrystal-monohydrate, (MACROBID) 100 MG capsule Take 1 capsule (100 mg total) by mouth 2 (two) times daily.   No facility-administered encounter medications on file as of 01/15/2017.    Allergies  Allergen Reactions  . Levaquin [Levofloxacin In D5w] Anaphylaxis, Rash and Other (See Comments)    Hot, dizziness, diarrhea  . Pravastatin Sodium [Pravachol]     Joint pain(severe)  . Biaxin [Clarithromycin] Nausea And Vomiting  . Ciprofloxacin Nausea Only  . Clarithromycin Nausea Only  . Dust Mite Extract Other (See Comments)      Grass, trees also- cause Runny nose, sneezing (allergy symptoms)  . Septra [Sulfamethoxazole-Trimethoprim]     diarrhea   Patient Active Problem List   Diagnosis Date Noted  . OA (osteoarthritis) of hip 09/27/2016  . S/P total knee arthroplasty 12/19/2014  . Intractable back pain 12/19/2014  . Sciatica of left side 12/19/2014  . Migraines 12/19/2014  . DDD (degenerative disc disease), lumbar 12/19/2014  . Fever 12/19/2014  . Left-sided low back pain with left-sided sciatica   . Status post total right knee replacement   . OA (osteoarthritis) of knee 12/07/2014  . 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 [place] ([place] 08/06/2012  . 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 Migraine with aura, intractable 08/06/2012  . Overactive bladder 07/31/2012  . Traumatic epidural hematoma ([place] 07/12/2012  . IBS (irritable bowel syndrome) 07/26/2011  . OSTEOARTHRITIS 01/26/2010  . NEPHROLITHIASIS, HX OF 01/26/2010  . DIVERTICULITIS, COLON 11/02/2008  .  Hyperlipidemia 02/25/2008  . COMMON MIGRAINE 10/17/2007  . ALLERGIC RHINITIS 06/19/2007  . GERD 06/19/2007   Social History   Social History  . Marital status: Married    Spouse name: SRemo Lipps . Number of children: 1  . Years of education: N/A   Occupational History  . Billing supervisor    Social History Main Topics  . Smoking status: Former Smoker    Packs/day: 0.25    Quit date: 12/20/1998  . Smokeless tobacco: Never Used  . Alcohol use No  . Drug use: No  . Sexual activity: Yes    Partners: Male    Birth control/ protection: Post-menopausal     Comment: 1st intercourse 89yo-2 partners   Other Topics Concern  . Not on file   Social History Narrative  . No narrative on file    Ms. Longino's family history includes Bladder Cancer in her sister; Diabetes in her mother; Heart disease in her mother  and sister; Hypertension in her father, mother, and sister; Leukemia in her father; Stomach cancer in her paternal grandmother.      Objective:    Vitals:   01/15/17 1330  BP: 120/70  Pulse: 78    Physical Exam   well-developed white female in no acute distress, pleasant accompanied by her husband blood pressure 120/70 pulse 78, height 5 foot 7, weight 195, BMI 30.5. HEENT ;nontraumatic normocephalic EOMI PERRLA sclera anicteric, Cardiovascular; regular rate and rhythm with S1-S2 no murmur or gallop, Pulmonary; clear bilaterally, Abdomen ;soft nontender nondistended bowel sounds are active no palpable mass or hepatosplenomegaly;Rectal;  large external hemorrhoids noninflamed, on digital exam there is some firmness at the tip of the examining finger anteriorly no definite mass, no stool to Hemoccult., Extremities ;no clubbing cyanosis or edema skin warm and dry, Neuropsych mood and affect appropriate       Assessment & Plan:   #47  66 year old white female with progressive microcytic anemia and complaints of rectal bleeding in setting of previously documented internal  hemorrhoids. Patient has been on regular NSAIDs over the past several months after hip replacement. We'll need to rule out occult colon lesion, peptic ulcer disease, chronic gastropathy/NSAID-induced or possibly AVMs #2 history of chronic GERD #3 diverticulosis #4 history of adenomatous colon polyps-  #5 status post bilateral hip replacements and knee replacement   Plan; patient encouraged to decrease NSAID use as much as possible and substitute Tylenol Check CBC today and iron studies - had previously been unable to tolerate oral iron She will be scheduled for EGD and colonoscopy with Dr. Henrene Pastor. Both procedures discussed in detail with the patient and her husband , including risks and benefits ,and they are agreeable to proceed. She will continue to use OTC lidocaine for now for her hemorrhoidal symptoms until colonoscopy completed   Kenwood Rosiak Genia Harold PA-C 01/15/2017   Cc: Laurey Morale, MD

## 2017-01-15 NOTE — Progress Notes (Signed)
Agree with initial assessment and plans as outlined 

## 2017-01-16 ENCOUNTER — Ambulatory Visit: Payer: 59 | Admitting: Physician Assistant

## 2017-01-16 ENCOUNTER — Other Ambulatory Visit: Payer: Self-pay

## 2017-01-16 ENCOUNTER — Encounter: Payer: Self-pay | Admitting: Internal Medicine

## 2017-01-16 ENCOUNTER — Telehealth: Payer: Self-pay

## 2017-01-16 DIAGNOSIS — D509 Iron deficiency anemia, unspecified: Secondary | ICD-10-CM

## 2017-01-16 NOTE — Telephone Encounter (Signed)
-----   Message from Alfredia Ferguson, PA-C sent at 01/16/2017  2:06 PM EDT ----- Please let pt know her hgb is stable , a little better at 10.5. She is definitely iron deficient.  Iron made her nauseated before but that was post op- lets try Integra plus one daily  X 4 months - if she cannot tolerate will need IV Iron rx- will await colon /EGD

## 2017-01-23 ENCOUNTER — Other Ambulatory Visit (HOSPITAL_COMMUNITY): Payer: Self-pay | Admitting: *Deleted

## 2017-01-24 ENCOUNTER — Ambulatory Visit (HOSPITAL_COMMUNITY)
Admission: RE | Admit: 2017-01-24 | Discharge: 2017-01-24 | Disposition: A | Discharge status: Home / Self Care | Payer: 59 | Source: Ambulatory Visit | Attending: Physician Assistant | Admitting: Physician Assistant

## 2017-01-24 ENCOUNTER — Encounter: Payer: Self-pay | Admitting: Internal Medicine

## 2017-01-24 DIAGNOSIS — D509 Iron deficiency anemia, unspecified: Secondary | ICD-10-CM

## 2017-01-24 MED ORDER — SODIUM CHLORIDE 0.9 % IV SOLN
510.0000 mg | INTRAVENOUS | Status: DC
  Administered 2017-01-24: 510 mg via INTRAVENOUS
  Filled 2017-01-24: qty 17

## 2017-01-24 NOTE — Discharge Instructions (Signed)

## 2017-01-29 ENCOUNTER — Encounter: Payer: Self-pay | Admitting: Internal Medicine

## 2017-01-29 ENCOUNTER — Ambulatory Visit (AMBULATORY_SURGERY_CENTER): Payer: 59 | Admitting: Internal Medicine

## 2017-01-29 VITALS — BP 127/74 | HR 77 | Temp 98.9°F | Resp 15 | Ht 67.0 in | Wt 195.0 lb

## 2017-01-29 DIAGNOSIS — D120 Benign neoplasm of cecum: Secondary | ICD-10-CM

## 2017-01-29 DIAGNOSIS — Z86010 Personal history of colonic polyps: Secondary | ICD-10-CM

## 2017-01-29 DIAGNOSIS — D500 Iron deficiency anemia secondary to blood loss (chronic): Secondary | ICD-10-CM

## 2017-01-29 DIAGNOSIS — D122 Benign neoplasm of ascending colon: Secondary | ICD-10-CM

## 2017-01-29 DIAGNOSIS — K625 Hemorrhage of anus and rectum: Secondary | ICD-10-CM

## 2017-01-29 HISTORY — PX: COLONOSCOPY: SHX174

## 2017-01-29 MED ORDER — SODIUM CHLORIDE 0.9 % IV SOLN: 500.0000 mL | INTRAVENOUS | Status: DC

## 2017-01-29 NOTE — Progress Notes (Signed)
Called to room to assist during endoscopic procedure.  Patient ID and intended procedure confirmed with present staff. Received instructions for my participation in the procedure from the performing physician.  

## 2017-01-29 NOTE — Patient Instructions (Signed)
Discharge instructions given. Handouts on polyps,diverticulosis and hemorrhoids. Resume previous medications. Office will schedule Capsule Endoscopy,complete iron infusion therapy and for repeat CBC and ferritin level in four weeks. YOU HAD AN ENDOSCOPIC PROCEDURE TODAY AT [facility]:   Refer to the procedure report that was given to you for any specific questions about what was found during the examination.  If the procedure report does not answer your questions, please call your gastroenterologist to clarify.  If you requested that your care partner not be given the details of your procedure findings, then the procedure report has been included in a sealed envelope for you to review at your convenience later.  YOU SHOULD EXPECT: Some feelings of bloating in the abdomen. Passage of more gas than usual.  Walking can help get rid of the air that was put into your GI tract during the procedure and reduce the bloating. If you had a lower endoscopy (such as a colonoscopy or flexible sigmoidoscopy) you may notice spotting of blood in your stool or on the toilet paper. If you underwent a bowel prep for your procedure, you may not have a normal bowel movement for a few days.  Please Note:  You might notice some irritation and congestion in your nose or some drainage.  This is from the oxygen used during your procedure.  There is no need for concern and it should clear up in a day or so.  SYMPTOMS TO REPORT IMMEDIATELY:   Following lower endoscopy (colonoscopy or flexible sigmoidoscopy):  Excessive amounts of blood in the stool  Significant tenderness or worsening of abdominal pains  Swelling of the abdomen that is new, acute  Fever of 100F or higher   Following upper endoscopy (EGD)  Vomiting of blood or coffee ground material  New chest pain or pain under the shoulder blades  Painful or persistently difficult swallowing  New shortness of breath  Fever of 100F or higher  Black,  tarry-looking stools  For urgent or emergent issues, a gastroenterologist can be reached at any hour by calling [phone].   DIET:  We do recommend a small meal at first, but then you may proceed to your regular diet.  Drink plenty of fluids but you should avoid alcoholic beverages for 24 hours.  ACTIVITY:  You should plan to take it easy for the rest of today and you should NOT DRIVE or use heavy machinery until tomorrow (because of the sedation medicines used during the test).    FOLLOW UP: Our staff will call the number listed on your records the next business day following your procedure to check on you and address any questions or concerns that you may have regarding the information given to you following your procedure. If we do not reach you, we will leave a message.  However, if you are feeling well and you are not experiencing any problems, there is no need to return our call.  We will assume that you have returned to your regular daily activities without incident.  If any biopsies were taken you will be contacted by phone or by letter within the next 1-3 weeks.  Please call us at [phone] if you have not heard about the biopsies in 3 weeks.    SIGNATURES/CONFIDENTIALITY: You and/or your care partner have signed paperwork which will be entered into your electronic medical record.  These signatures attest to the fact that that the information above on your After Visit Summary has been reviewed and is  understood.  Full responsibility of the confidentiality of this discharge information lies with you and/or your care-partner.

## 2017-01-29 NOTE — Op Note (Signed)
[facility] Patient Name: Victoria Mosley Procedure Date: 01/29/2017 3:23 PM MRN: [id number] Endoscopist: Docia Chuck. Henrene Pastor , MD Age: 66 Referring MD:  Date of Birth: [birth year] Gender: Female Account #: [id number] Procedure:                Colonoscopy, with cold snare polypectomy x 2 Indications:              Iron deficiency anemia. History of adenomatous                            colon polyps. Last surveillance exam May 2013.                            Also, bright blood per rectum Medicines:                Monitored Anesthesia Care Procedure:                Pre-Anesthesia Assessment:                           - Prior to the procedure, a History and Physical                            was performed, and patient medications and                            allergies were reviewed. The patient's tolerance of                            previous anesthesia was also reviewed. The risks                            and benefits of the procedure and the sedation                            options and risks were discussed with the patient.                            All questions were answered, and informed consent                            was obtained. Prior Anticoagulants: The patient has                            taken no previous anticoagulant or antiplatelet                            agents. ASA Grade Assessment: II - A patient with                            mild systemic disease. After reviewing the risks                            and benefits, the patient was deemed in  satisfactory condition to undergo the procedure.                           After obtaining informed consent, the colonoscope                            was passed under direct vision. Throughout the                            procedure, the patient's blood pressure, pulse, and                            oxygen saturations were monitored continuously. The   Colonoscope was introduced through the anus and                            advanced to the the cecum, identified by                            appendiceal orifice and ileocecal valve. The                            ileocecal valve, appendiceal orifice, and rectum                            were photographed. The quality of the bowel                            preparation was excellent. The colonoscopy was                            performed without difficulty. The patient tolerated                            the procedure well. The bowel preparation used was                            SUPREP. Scope In: 3:29:45 PM Scope Out: 3:45:48 PM Scope Withdrawal Time: 0 hours 12 minutes 20 seconds  Total Procedure Duration: 0 hours 16 minutes 3 seconds  Findings:                 Two polyps were found in the ascending colon and                            cecum. The polyps were 2 to 4 mm in size. These                            polyps were removed with a cold snare. Resection                            and retrieval were complete.                           Multiple small-mouthed diverticula were found  in                            the left colon.                           External and internal hemorrhoids were found during                            retroflexion.                           The exam was otherwise without abnormality on                            direct and retroflexion views. Complications:            No immediate complications. Estimated blood loss:                            None. Estimated Blood Loss:     Estimated blood loss: none. Impression:               - Two 2 to 4 mm polyps in the ascending colon and                            in the cecum, removed with a cold snare. Resected                            and retrieved.                           - Diverticulosis in the left colon.                           - External and internal hemorrhoids.                           - The  examination was otherwise normal on direct                            and retroflexion views. Recommendation:           - Repeat colonoscopy in 5 years for surveillance.                           - EGD today. Please see report for findings and                            recommendations.                           - Resume previous diet.                           - Continue present medications.                           -  Await pathology results. Docia Chuck. Henrene Pastor, MD 01/29/2017 3:58:23 PM This report has been signed electronically.

## 2017-01-29 NOTE — Op Note (Signed)
[facility] Patient Name: Victoria Mosley Procedure Date: 01/29/2017 3:22 PM MRN: [id number] Endoscopist: Docia Chuck. Henrene Pastor , MD Age: 66 Referring MD:  Date of Birth: Apr 13, 1951 Gender: Female Account #: [id number] Procedure:                Upper GI endoscopy, with biopsies Indications:              Iron deficiency anemia Medicines:                Monitored Anesthesia Care Procedure:                Pre-Anesthesia Assessment:                           - Prior to the procedure, a History and Physical                            was performed, and patient medications and                            allergies were reviewed. The patient's tolerance of                            previous anesthesia was also reviewed. The risks                            and benefits of the procedure and the sedation                            options and risks were discussed with the patient.                            All questions were answered, and informed consent                            was obtained. Prior Anticoagulants: The patient has                            taken no previous anticoagulant or antiplatelet                            agents. ASA Grade Assessment: II - A patient with                            mild systemic disease. After reviewing the risks                            and benefits, the patient was deemed in                            satisfactory condition to undergo the procedure.                           After obtaining informed consent, the endoscope was  passed under direct vision. Throughout the                            procedure, the patient's blood pressure, pulse, and                            oxygen saturations were monitored continuously. The                            Endoscope was introduced through the mouth, and                            advanced to the third part of duodenum. The upper                            GI endoscopy was  accomplished without difficulty.                            The patient tolerated the procedure well. Scope In: Scope Out: Findings:                 The esophagus was normal.                           The stomach was normal.                           The examined duodenum was normal. Biopsies for                            histology were taken with a cold forceps for                            evaluation of celiac disease.                           The cardia and gastric fundus were normal on                            retroflexion. Complications:            No immediate complications. Estimated Blood Loss:     Estimated blood loss: none. Impression:               - Normal esophagus.                           - Normal stomach.                           - Normal examined duodenum. Biopsied. Recommendation:           1. Schedule capsule endoscopy "iron deficiency                            anemia rule out small bowel lesion"  2. Complete iron infusion therapy                           3. Repeat CBC and ferritin level for weeks                           4. Follow-up duodenal biopsies                           5. Office follow-up with Dr. Henrene Pastor after all the                            above completed (approximately 6 weeks) Docia Chuck. Henrene Pastor, MD 01/29/2017 4:04:58 PM This report has been signed electronically.

## 2017-01-29 NOTE — Progress Notes (Signed)
IRON INFUSION STARTED LAST WEEK,THAT'S CHANGE SINCE OFFICE VISIT.

## 2017-01-29 NOTE — Progress Notes (Signed)
Pt had recent hip replacement; states EMMI video said she needs to take PCN prior to antibiotic.  Also, needs antibiotic for dental procedure. Dr. Henrene Pastor made aware and no needs for pre procedure antibiotic

## 2017-01-30 ENCOUNTER — Other Ambulatory Visit: Payer: Self-pay

## 2017-01-30 ENCOUNTER — Telehealth: Payer: Self-pay | Admitting: *Deleted

## 2017-01-30 DIAGNOSIS — D509 Iron deficiency anemia, unspecified: Secondary | ICD-10-CM

## 2017-01-30 NOTE — Telephone Encounter (Signed)
  Follow up Call-  Call back number 01/29/2017  Post procedure Call Back phone  # [phone] NUMBER  Permission to leave phone message Yes  Some recent data might be hidden     Patient questions:  Do you have a fever, pain , or abdominal swelling? No. Pain Score  0 *  Have you tolerated food without any problems? Yes.    Have you been able to return to your normal activities? Yes.    Do you have any questions about your discharge instructions: Diet   No. Medications  No. Follow up visit  No.  Do you have questions or concerns about your Care? No.  Actions: * If pain score is 4 or above: No action needed, pain <4.

## 2017-01-31 ENCOUNTER — Telehealth: Payer: Self-pay

## 2017-01-31 NOTE — Telephone Encounter (Signed)
Dr. Leighton Ruff

## 2017-01-31 NOTE — Telephone Encounter (Signed)
Pt knows to come in 4 weeks for labs, orders in epic. Pt scheduled for capsule endo at 8am 02/12/17, prep and instructions reviewed over the phone with pt and mailed to pt. Pt scheduled for OV with Dr. Henrene Pastor 03/12/17@10am . Pt aware of appt.  Pt states Dr. Henrene Pastor mentioned a referral to her regarding her external hemorrhoids. Please advise which surgeon you would like for her to see.

## 2017-02-01 ENCOUNTER — Ambulatory Visit (HOSPITAL_COMMUNITY)
Admission: RE | Admit: 2017-02-01 | Discharge: 2017-02-01 | Disposition: A | Discharge status: Home / Self Care | Payer: 59 | Source: Ambulatory Visit | Attending: Physician Assistant | Admitting: Physician Assistant

## 2017-02-01 DIAGNOSIS — D509 Iron deficiency anemia, unspecified: Secondary | ICD-10-CM | POA: Diagnosis present

## 2017-02-01 MED ORDER — FERUMOXYTOL INJECTION 510 MG/17 ML
510.0000 mg | INTRAVENOUS | Status: DC
  Administered 2017-02-01: 510 mg via INTRAVENOUS
  Filled 2017-02-01: qty 17

## 2017-02-01 NOTE — Telephone Encounter (Signed)
Referral faxed to [facility] 

## 2017-02-02 ENCOUNTER — Emergency Department (HOSPITAL_COMMUNITY): Payer: 59

## 2017-02-02 ENCOUNTER — Emergency Department (HOSPITAL_COMMUNITY): Payer: 59 | Admitting: Registered Nurse

## 2017-02-02 ENCOUNTER — Encounter (HOSPITAL_COMMUNITY)
Admission: EM | Disposition: A | Discharge status: Home / Self Care | Payer: Self-pay | Source: Home / Self Care | Attending: Internal Medicine

## 2017-02-02 ENCOUNTER — Telehealth: Payer: Self-pay | Admitting: Internal Medicine

## 2017-02-02 ENCOUNTER — Inpatient Hospital Stay (HOSPITAL_COMMUNITY): Payer: 59

## 2017-02-02 ENCOUNTER — Inpatient Hospital Stay (HOSPITAL_COMMUNITY)
Admission: EM | Admit: 2017-02-02 | Discharge: 2017-02-06 | DRG: 871 | Disposition: A | Discharge status: Home / Self Care | Payer: 59 | Attending: Internal Medicine | Admitting: Internal Medicine

## 2017-02-02 ENCOUNTER — Encounter (HOSPITAL_COMMUNITY): Payer: Self-pay | Admitting: Emergency Medicine

## 2017-02-02 DIAGNOSIS — R6521 Severe sepsis with septic shock: Secondary | ICD-10-CM | POA: Diagnosis present

## 2017-02-02 DIAGNOSIS — Z780 Asymptomatic menopausal state: Secondary | ICD-10-CM | POA: Diagnosis not present

## 2017-02-02 DIAGNOSIS — D509 Iron deficiency anemia, unspecified: Secondary | ICD-10-CM | POA: Diagnosis present

## 2017-02-02 DIAGNOSIS — M170 Bilateral primary osteoarthritis of knee: Secondary | ICD-10-CM | POA: Diagnosis present

## 2017-02-02 DIAGNOSIS — D259 Leiomyoma of uterus, unspecified: Secondary | ICD-10-CM | POA: Diagnosis present

## 2017-02-02 DIAGNOSIS — D508 Other iron deficiency anemias: Secondary | ICD-10-CM | POA: Diagnosis not present

## 2017-02-02 DIAGNOSIS — N390 Urinary tract infection, site not specified: Secondary | ICD-10-CM | POA: Diagnosis not present

## 2017-02-02 DIAGNOSIS — A419 Sepsis, unspecified organism: Secondary | ICD-10-CM | POA: Diagnosis not present

## 2017-02-02 DIAGNOSIS — Z96651 Presence of right artificial knee joint: Secondary | ICD-10-CM | POA: Diagnosis present

## 2017-02-02 DIAGNOSIS — E785 Hyperlipidemia, unspecified: Secondary | ICD-10-CM | POA: Diagnosis present

## 2017-02-02 DIAGNOSIS — K589 Irritable bowel syndrome without diarrhea: Secondary | ICD-10-CM | POA: Diagnosis present

## 2017-02-02 DIAGNOSIS — Z85820 Personal history of malignant melanoma of skin: Secondary | ICD-10-CM

## 2017-02-02 DIAGNOSIS — N281 Cyst of kidney, acquired: Secondary | ICD-10-CM | POA: Diagnosis present

## 2017-02-02 DIAGNOSIS — Z91048 Other nonmedicinal substance allergy status: Secondary | ICD-10-CM | POA: Diagnosis not present

## 2017-02-02 DIAGNOSIS — N201 Calculus of ureter: Secondary | ICD-10-CM | POA: Diagnosis not present

## 2017-02-02 DIAGNOSIS — Z79890 Hormone replacement therapy (postmenopausal): Secondary | ICD-10-CM

## 2017-02-02 DIAGNOSIS — E876 Hypokalemia: Secondary | ICD-10-CM | POA: Diagnosis present

## 2017-02-02 DIAGNOSIS — Z833 Family history of diabetes mellitus: Secondary | ICD-10-CM | POA: Diagnosis not present

## 2017-02-02 DIAGNOSIS — Z881 Allergy status to other antibiotic agents status: Secondary | ICD-10-CM

## 2017-02-02 DIAGNOSIS — N132 Hydronephrosis with renal and ureteral calculous obstruction: Secondary | ICD-10-CM | POA: Diagnosis not present

## 2017-02-02 DIAGNOSIS — R1031 Right lower quadrant pain: Secondary | ICD-10-CM | POA: Diagnosis not present

## 2017-02-02 DIAGNOSIS — Z806 Family history of leukemia: Secondary | ICD-10-CM

## 2017-02-02 DIAGNOSIS — Z8052 Family history of malignant neoplasm of bladder: Secondary | ICD-10-CM

## 2017-02-02 DIAGNOSIS — Z1623 Resistance to quinolones and fluoroquinolones: Secondary | ICD-10-CM | POA: Diagnosis present

## 2017-02-02 DIAGNOSIS — N1339 Other hydronephrosis: Secondary | ICD-10-CM | POA: Diagnosis not present

## 2017-02-02 DIAGNOSIS — Z79899 Other long term (current) drug therapy: Secondary | ICD-10-CM

## 2017-02-02 DIAGNOSIS — N136 Pyonephrosis: Secondary | ICD-10-CM | POA: Diagnosis present

## 2017-02-02 DIAGNOSIS — Z87440 Personal history of urinary (tract) infections: Secondary | ICD-10-CM | POA: Diagnosis not present

## 2017-02-02 DIAGNOSIS — Z87891 Personal history of nicotine dependence: Secondary | ICD-10-CM | POA: Diagnosis not present

## 2017-02-02 DIAGNOSIS — N319 Neuromuscular dysfunction of bladder, unspecified: Secondary | ICD-10-CM | POA: Diagnosis present

## 2017-02-02 DIAGNOSIS — G43909 Migraine, unspecified, not intractable, without status migrainosus: Secondary | ICD-10-CM | POA: Diagnosis present

## 2017-02-02 DIAGNOSIS — R319 Hematuria, unspecified: Secondary | ICD-10-CM | POA: Diagnosis not present

## 2017-02-02 DIAGNOSIS — Z87442 Personal history of urinary calculi: Secondary | ICD-10-CM | POA: Diagnosis not present

## 2017-02-02 DIAGNOSIS — N1330 Unspecified hydronephrosis: Secondary | ICD-10-CM | POA: Diagnosis not present

## 2017-02-02 DIAGNOSIS — A4151 Sepsis due to Escherichia coli [E. coli]: Secondary | ICD-10-CM | POA: Diagnosis present

## 2017-02-02 DIAGNOSIS — G8381 Brown-Sequard syndrome: Secondary | ICD-10-CM | POA: Diagnosis present

## 2017-02-02 DIAGNOSIS — Z419 Encounter for procedure for purposes other than remedying health state, unspecified: Secondary | ICD-10-CM

## 2017-02-02 DIAGNOSIS — Z96643 Presence of artificial hip joint, bilateral: Secondary | ICD-10-CM | POA: Diagnosis present

## 2017-02-02 DIAGNOSIS — Z800 Family history of malignant neoplasm of digestive organs: Secondary | ICD-10-CM

## 2017-02-02 DIAGNOSIS — R7881 Bacteremia: Secondary | ICD-10-CM | POA: Diagnosis not present

## 2017-02-02 DIAGNOSIS — K219 Gastro-esophageal reflux disease without esophagitis: Secondary | ICD-10-CM | POA: Diagnosis present

## 2017-02-02 DIAGNOSIS — Z888 Allergy status to other drugs, medicaments and biological substances status: Secondary | ICD-10-CM

## 2017-02-02 DIAGNOSIS — Z86010 Personal history of colonic polyps: Secondary | ICD-10-CM

## 2017-02-02 DIAGNOSIS — Z8249 Family history of ischemic heart disease and other diseases of the circulatory system: Secondary | ICD-10-CM

## 2017-02-02 DIAGNOSIS — E872 Acidosis: Secondary | ICD-10-CM | POA: Diagnosis present

## 2017-02-02 DIAGNOSIS — N3080 Other cystitis without hematuria: Secondary | ICD-10-CM | POA: Diagnosis present

## 2017-02-02 HISTORY — PX: CYSTOSCOPY W/ URETERAL STENT PLACEMENT: SHX1429

## 2017-02-02 LAB — LIPASE, BLOOD: Lipase: 19 U/L (ref 11–51)

## 2017-02-02 LAB — URINALYSIS, ROUTINE W REFLEX MICROSCOPIC
Bilirubin Urine: NEGATIVE
Glucose, UA: NEGATIVE mg/dL
Ketones, ur: 5 mg/dL — AB
Nitrite: NEGATIVE
Protein, ur: NEGATIVE mg/dL
Specific Gravity, Urine: 1.016 (ref 1.005–1.030)
Squamous Epithelial / LPF: NONE SEEN
pH: 6 (ref 5.0–8.0)

## 2017-02-02 LAB — COMPREHENSIVE METABOLIC PANEL
ALT: 25 U/L (ref 14–54)
AST: 34 U/L (ref 15–41)
Albumin: 3.9 g/dL (ref 3.5–5.0)
Alkaline Phosphatase: 63 U/L (ref 38–126)
Anion gap: 10 (ref 5–15)
BUN: 19 mg/dL (ref 6–20)
CO2: 26 mmol/L (ref 22–32)
Calcium: 9.5 mg/dL (ref 8.9–10.3)
Chloride: 105 mmol/L (ref 101–111)
Creatinine, Ser: 0.91 mg/dL (ref 0.44–1.00)
GFR calc Af Amer: >60 mL/min (ref >60)
GFR calc non Af Amer: >60 mL/min (ref >60)
Glucose, Bld: 132 mg/dL — ABNORMAL HIGH (ref 65–99)
Potassium: 3.6 mmol/L (ref 3.5–5.1)
Sodium: 141 mmol/L (ref 135–145)
Total Bilirubin: 0.6 mg/dL (ref 0.3–1.2)
Total Protein: 7.2 g/dL (ref 6.5–8.1)

## 2017-02-02 LAB — APTT: aPTT: 36 seconds (ref 24–36)

## 2017-02-02 LAB — CBC
HCT: 35.6 % — ABNORMAL LOW (ref 36.0–46.0)
Hemoglobin: 10.7 g/dL — ABNORMAL LOW (ref 12.0–15.0)
MCH: 20.9 pg — ABNORMAL LOW (ref 26.0–34.0)
MCHC: 30.1 g/dL (ref 30.0–36.0)
MCV: 69.4 fL — ABNORMAL LOW (ref 78.0–100.0)
Platelets: 251 10*3/uL (ref 150–400)
RBC: 5.13 MIL/uL — ABNORMAL HIGH (ref 3.87–5.11)
RDW: 25.3 % — ABNORMAL HIGH (ref 11.5–15.5)
WBC: 9.5 10*3/uL (ref 4.0–10.5)

## 2017-02-02 LAB — PROCALCITONIN: Procalcitonin: 12.59 ng/mL

## 2017-02-02 LAB — PROTIME-INR
INR: 1.13
Prothrombin Time: 14.6 seconds (ref 11.4–15.2)

## 2017-02-02 LAB — MRSA PCR SCREENING: MRSA by PCR: NEGATIVE

## 2017-02-02 LAB — LACTIC ACID, PLASMA: Lactic Acid, Venous: 2.7 mmol/L (ref 0.5–1.9)

## 2017-02-02 SURGERY — CYSTOSCOPY, WITH RETROGRADE PYELOGRAM AND URETERAL STENT INSERTION
Anesthesia: General | Site: Urethra | Laterality: Right

## 2017-02-02 MED ORDER — SODIUM CHLORIDE 0.9 % IV SOLN
INTRAVENOUS | Status: AC
  Administered 2017-02-02 – 2017-02-03 (×3): via INTRAVENOUS

## 2017-02-02 MED ORDER — POLYETHYLENE GLYCOL 3350 17 G PO PACK
8.5000 g | PACK | Freq: Every day | ORAL | Status: DC
  Administered 2017-02-02 – 2017-02-05 (×4): 8.5 g via ORAL
  Filled 2017-02-02 (×4): qty 1

## 2017-02-02 MED ORDER — PHENYLEPHRINE 40 MCG/ML (10ML) SYRINGE FOR IV PUSH (FOR BLOOD PRESSURE SUPPORT)
PREFILLED_SYRINGE | INTRAVENOUS | Status: AC
  Filled 2017-02-02: qty 10

## 2017-02-02 MED ORDER — MEDROXYPROGESTERONE ACETATE 2.5 MG PO TABS
2.5000 mg | ORAL_TABLET | Freq: Every day | ORAL | Status: DC
  Administered 2017-02-02 – 2017-02-06 (×5): 2.5 mg via ORAL
  Filled 2017-02-02 (×5): qty 1

## 2017-02-02 MED ORDER — MIDAZOLAM HCL 2 MG/2ML IJ SOLN
INTRAMUSCULAR | Status: AC
  Filled 2017-02-02: qty 2

## 2017-02-02 MED ORDER — KETOROLAC TROMETHAMINE 30 MG/ML IJ SOLN
15.0000 mg | Freq: Once | INTRAMUSCULAR | Status: AC
  Administered 2017-02-02: 15 mg via INTRAVENOUS
  Filled 2017-02-02: qty 1

## 2017-02-02 MED ORDER — ACETAMINOPHEN 500 MG PO TABS
1000.0000 mg | ORAL_TABLET | Freq: Once | ORAL | Status: AC
  Administered 2017-02-02: 1000 mg via ORAL
  Filled 2017-02-02: qty 2

## 2017-02-02 MED ORDER — OXYBUTYNIN CHLORIDE 5 MG PO TABS: 5.0000 mg | ORAL_TABLET | Freq: Three times a day (TID) | ORAL | Status: DC | PRN

## 2017-02-02 MED ORDER — PANTOPRAZOLE SODIUM 40 MG PO TBEC
40.0000 mg | DELAYED_RELEASE_TABLET | Freq: Every day | ORAL | Status: DC
  Administered 2017-02-02 – 2017-02-06 (×5): 40 mg via ORAL
  Filled 2017-02-02 (×5): qty 1

## 2017-02-02 MED ORDER — LORATADINE 10 MG PO TABS
10.0000 mg | ORAL_TABLET | Freq: Every day | ORAL | Status: DC
  Administered 2017-02-03 – 2017-02-06 (×4): 10 mg via ORAL
  Filled 2017-02-02 (×4): qty 1

## 2017-02-02 MED ORDER — DIPHENHYDRAMINE HCL 25 MG PO CAPS
25.0000 mg | ORAL_CAPSULE | Freq: Every day | ORAL | Status: DC
  Administered 2017-02-03 – 2017-02-05 (×3): 25 mg via ORAL
  Filled 2017-02-02 (×3): qty 1

## 2017-02-02 MED ORDER — DEXAMETHASONE SODIUM PHOSPHATE 10 MG/ML IJ SOLN
INTRAMUSCULAR | Status: DC | PRN
  Administered 2017-02-02: 10 mg via INTRAVENOUS

## 2017-02-02 MED ORDER — CEFTRIAXONE SODIUM 1 G IJ SOLR
1.0000 g | Freq: Once | INTRAMUSCULAR | Status: AC
  Administered 2017-02-02: 1 g via INTRAVENOUS
  Filled 2017-02-02: qty 10

## 2017-02-02 MED ORDER — ONDANSETRON HCL 4 MG/2ML IJ SOLN
4.0000 mg | INTRAMUSCULAR | Status: AC | PRN
  Administered 2017-02-02 (×2): 4 mg via INTRAVENOUS
  Filled 2017-02-02: qty 2

## 2017-02-02 MED ORDER — PHENYLEPHRINE 40 MCG/ML (10ML) SYRINGE FOR IV PUSH (FOR BLOOD PRESSURE SUPPORT)
PREFILLED_SYRINGE | INTRAVENOUS | Status: DC | PRN
  Administered 2017-02-02 (×4): 80 ug via INTRAVENOUS

## 2017-02-02 MED ORDER — SODIUM CHLORIDE 0.9 % IV BOLUS (SEPSIS)
1000.0000 mL | Freq: Once | INTRAVENOUS | Status: AC
  Administered 2017-02-02: 1000 mL via INTRAVENOUS

## 2017-02-02 MED ORDER — FENTANYL CITRATE (PF) 100 MCG/2ML IJ SOLN
INTRAMUSCULAR | Status: AC
  Filled 2017-02-02: qty 2

## 2017-02-02 MED ORDER — DEXAMETHASONE SODIUM PHOSPHATE 10 MG/ML IJ SOLN
INTRAMUSCULAR | Status: AC
  Filled 2017-02-02: qty 1

## 2017-02-02 MED ORDER — MIDAZOLAM HCL 5 MG/5ML IJ SOLN
INTRAMUSCULAR | Status: DC | PRN
  Administered 2017-02-02: 2 mg via INTRAVENOUS

## 2017-02-02 MED ORDER — EZETIMIBE 10 MG PO TABS
10.0000 mg | ORAL_TABLET | Freq: Every day | ORAL | Status: DC
  Administered 2017-02-03 – 2017-02-06 (×4): 10 mg via ORAL
  Filled 2017-02-02 (×5): qty 1

## 2017-02-02 MED ORDER — PROPOFOL 10 MG/ML IV BOLUS
INTRAVENOUS | Status: AC
  Filled 2017-02-02: qty 20

## 2017-02-02 MED ORDER — SODIUM CHLORIDE 0.9 % IR SOLN
Status: DC | PRN
  Administered 2017-02-02: 3000 mL

## 2017-02-02 MED ORDER — DEXTROSE 5 % IV SOLN
2.0000 g | INTRAVENOUS | Status: DC
  Administered 2017-02-03 – 2017-02-05 (×4): 2 g via INTRAVENOUS
  Filled 2017-02-02 (×4): qty 2

## 2017-02-02 MED ORDER — SCOPOLAMINE 1 MG/3DAYS TD PT72
MEDICATED_PATCH | TRANSDERMAL | Status: DC | PRN
  Administered 2017-02-02: 1 via TRANSDERMAL

## 2017-02-02 MED ORDER — PROPOFOL 10 MG/ML IV BOLUS
INTRAVENOUS | Status: DC | PRN
  Administered 2017-02-02: 170 mg via INTRAVENOUS

## 2017-02-02 MED ORDER — IOPAMIDOL (ISOVUE-300) INJECTION 61%
100.0000 mL | Freq: Once | INTRAVENOUS | Status: AC | PRN
  Administered 2017-02-02: 100 mL via INTRAVENOUS

## 2017-02-02 MED ORDER — ONDANSETRON HCL 4 MG/2ML IJ SOLN
INTRAMUSCULAR | Status: AC
  Filled 2017-02-02: qty 2

## 2017-02-02 MED ORDER — LACTATED RINGERS IV SOLN
INTRAVENOUS | Status: DC | PRN
  Administered 2017-02-02 (×2): via INTRAVENOUS

## 2017-02-02 MED ORDER — FENTANYL CITRATE (PF) 100 MCG/2ML IJ SOLN
50.0000 ug | INTRAMUSCULAR | Status: AC | PRN
  Administered 2017-02-02 (×4): 50 ug via INTRAVENOUS
  Filled 2017-02-02 (×2): qty 2

## 2017-02-02 MED ORDER — PHENAZOPYRIDINE HCL 100 MG PO TABS
100.0000 mg | ORAL_TABLET | Freq: Three times a day (TID) | ORAL | Status: AC
  Administered 2017-02-02 – 2017-02-05 (×9): 100 mg via ORAL
  Filled 2017-02-02 (×10): qty 1

## 2017-02-02 MED ORDER — LACTATED RINGERS IV SOLN: INTRAVENOUS | Status: DC

## 2017-02-02 MED ORDER — PROMETHAZINE HCL 25 MG/ML IJ SOLN: 6.2500 mg | INTRAMUSCULAR | Status: DC | PRN

## 2017-02-02 MED ORDER — IOPAMIDOL (ISOVUE-300) INJECTION 61%
INTRAVENOUS | Status: AC
  Filled 2017-02-02: qty 100

## 2017-02-02 MED ORDER — LIDOCAINE 2% (20 MG/ML) 5 ML SYRINGE
INTRAMUSCULAR | Status: DC | PRN
  Administered 2017-02-02: 100 mg via INTRAVENOUS

## 2017-02-02 MED ORDER — SODIUM CHLORIDE 0.9% FLUSH
3.0000 mL | Freq: Two times a day (BID) | INTRAVENOUS | Status: DC
  Administered 2017-02-02 – 2017-02-06 (×7): 3 mL via INTRAVENOUS

## 2017-02-02 MED ORDER — HYDROCORTISONE NA SUCCINATE PF 100 MG IJ SOLR
50.0000 mg | Freq: Four times a day (QID) | INTRAMUSCULAR | Status: DC
  Administered 2017-02-02 – 2017-02-05 (×10): 50 mg via INTRAVENOUS
  Filled 2017-02-02 (×10): qty 2

## 2017-02-02 MED ORDER — ENOXAPARIN SODIUM 30 MG/0.3ML ~~LOC~~ SOLN: 30.0000 mg | SUBCUTANEOUS | Status: DC

## 2017-02-02 MED ORDER — ACETAMINOPHEN 500 MG PO TABS
1000.0000 mg | ORAL_TABLET | Freq: Four times a day (QID) | ORAL | Status: DC | PRN
  Administered 2017-02-03 – 2017-02-06 (×4): 1000 mg via ORAL
  Filled 2017-02-02 (×4): qty 2

## 2017-02-02 MED ORDER — LACTATED RINGERS IV BOLUS (SEPSIS)
1000.0000 mL | Freq: Once | INTRAVENOUS | Status: AC
  Administered 2017-02-02: 1000 mL via INTRAVENOUS

## 2017-02-02 MED ORDER — MEPERIDINE HCL 50 MG/ML IJ SOLN: 6.2500 mg | INTRAMUSCULAR | Status: DC | PRN

## 2017-02-02 MED ORDER — LIDOCAINE 2% (20 MG/ML) 5 ML SYRINGE
INTRAMUSCULAR | Status: AC
  Filled 2017-02-02: qty 5

## 2017-02-02 MED ORDER — HYDROMORPHONE HCL 1 MG/ML IJ SOLN: 0.2500 mg | INTRAMUSCULAR | Status: DC | PRN

## 2017-02-02 MED ORDER — SCOPOLAMINE 1 MG/3DAYS TD PT72
MEDICATED_PATCH | TRANSDERMAL | Status: AC
  Filled 2017-02-02: qty 1

## 2017-02-02 SURGICAL SUPPLY — 11 items
BAG URO CATCHER STRL LF (MISCELLANEOUS) ×2 IMPLANT
CATH URET 5FR 28IN OPEN ENDED (CATHETERS) ×2 IMPLANT
CLOTH BEACON ORANGE TIMEOUT ST (SAFETY) ×2 IMPLANT
COVER SURGICAL LIGHT HANDLE (MISCELLANEOUS) ×2 IMPLANT
GLOVE SURG SS PI 8.0 STRL IVOR (GLOVE) ×2 IMPLANT
GOWN STRL REUS W/TWL XL LVL3 (GOWN DISPOSABLE) ×2 IMPLANT
GUIDEWIRE STR DUAL SENSOR (WIRE) ×2 IMPLANT
MANIFOLD NEPTUNE II (INSTRUMENTS) ×2 IMPLANT
PACK CYSTO (CUSTOM PROCEDURE TRAY) ×2 IMPLANT
STENT CONTOUR 6FRX26X.038 (STENTS) ×2 IMPLANT
TUBING CONNECTING 10 (TUBING) ×2 IMPLANT

## 2017-02-02 NOTE — Anesthesia Procedure Notes (Signed)
Procedure Name: LMA Insertion Date/Time: 02/02/2017 5:20 PM Performed by: Talbot Grumbling Pre-anesthesia Checklist: Emergency Drugs available, Patient being monitored, Suction available and Patient identified Patient Re-evaluated:Patient Re-evaluated prior to inductionOxygen Delivery Method: Circle system utilized Preoxygenation: Pre-oxygenation with 100% oxygen Intubation Type: IV induction Ventilation: Mask ventilation without difficulty LMA: LMA inserted LMA Size: 4.0 Number of attempts: 1 Placement Confirmation: positive ETCO2 and breath sounds checked- equal and bilateral Tube secured with: Tape Dental Injury: Teeth and Oropharynx as per pre-operative assessment

## 2017-02-02 NOTE — ED Notes (Signed)
Patient c/o headache and requesting tylenol. Made Dr Laneta Simmers aware.

## 2017-02-02 NOTE — Transfer of Care (Signed)
Immediate Anesthesia Transfer of Care Note  Patient: Victoria Mosley  Procedure(s) Performed: Procedure(s): CYSTOSCOPY WITH RETROGRADE PYELOGRAM/URETERAL STENT PLACEMENT (Right)  Patient Location: PACU  Anesthesia Type:General  Level of Consciousness:  sedated, patient cooperative and responds to stimulation  Airway & Oxygen Therapy:Patient Spontanous Breathing and Patient connected to face mask oxgen  Post-op Assessment:  Report given to PACU RN and Post -op Vital signs reviewed and stable  Post vital signs:  Reviewed and stable  Last Vitals:  Vitals:   02/02/17 1500 02/02/17 1530  BP: (!) 98/54 (!) 97/53  Pulse: 92 89  Resp:    Temp:      Complications: No apparent anesthesia complications

## 2017-02-02 NOTE — Op Note (Signed)
Preoperative Diagnosis: Right distal ureteral calculus  Postoperative Diagnosis:  Same  Procedure(s) Performed:   1. Cystourethroscopy 2. Right ureteral stent placement, 6Fr x 26cm JJ ureteral without dangler 3. Intraoperative fluoroscopy with interpretation <1hr.   Teaching Surgeon:  Irine Seal, MD  Resident Surgeon:  Sharmaine Base, MD  Assistant(s):  None  Anesthesia:  General  Fluids:  See anesthesia record  Estimated blood loss:  0 mL  Specimens:  None  Cultures:  Right renal pelvis urine for culture  Drains:   RIGHT 6Fr x 26cm JJ ureteral stent without dangler  Complications:  None  Indications: 66 y.o. patient with a history of NGB, recurrent UTI, nephrolithiasis, bilateral renal cysts presented to ER today with large right distal ureteral calculus with mild hydronephrosis, right perinephric stranding, hypotension and concern for infected obstructing stone and impending sepsis. We recommended right ureteral stent placement. Risks & benefits of the procedure discussed with the patient, who wishes to proceed.  Findings:  Cloudy urine with white particulate matter. Diffuse multifocal cystitis cystica glandularis. Radioopacity noted in expected location of right distal ureter. Successful stent placement.  Description:  The patient was correctly identified in the preop holding area where written informed consent as well potential risk and complication reviewed. The patient agreed. They were brought back to the operative suite. Once correct information was verified, general anesthesia was induced. They were then gently placed into dorsal lithotomy position with SCDs in place for VTE prophylaxis. They were prepped and draped in the usual sterile fashion. She received rocephin in the ER. A timeout was then performed.   We inserted a 45F rigid cystoscope per urethra with copious lubrication and normal saline irrigation running. This demonstrated a normal urethra, cloudy urine on  entry to the bladder, no tumors, stones or foreign bodies. There was multifocal diffuse cystitis cystica glandularis noted throughout the bladder.  We turned our attention to the right ureteral orifice which we cannulated with a combination of a 5 [place] open-ended catheter and a sensor wire. The sensor wire was advanced to the presumed level of the right renal pelvis where an appropriate curl was noted under fluoroscopy without difficulty. The 5 [place] open-ended catheter was advanced into the right proximal ureter and the sensor wire was removed.   Hydronephrotic drip was collected and aspirated from the 5Fr open ended catheter and sent for urine culture.  We then replaced the sensor wire into the right kidney under fluoroscopic guidance without difficulty and removed the 5 [place] open-ended catheter. We advanced a 6Fr x 26cm JJ ureteral without dangler over the wire with the assistance of a stent pusher under direct visual and fluoroscopic guidance without difficulty. Sensor wire removal demonstrated satisfactory stent curl noted proximally in the right renal pelvis as well as distally within the bladder.  The bladder was left full and all instrumentation was removed. A 16Fr Foley urethral catheter was placed with 10 mL of sterile water put into the catheter balloon. The patient was woken up from anesthesia and taken to the recovery unit for routine postoperative care.  Post Op Plan:   1. Definitive stone extraction will take place in delayed fashion in about 1-2 weeks 2. Follow up ER blood & urine cultures, intraoperative urine culture. Tailor antibiotics accordingly 3. Recommend ditropan 5mg  TID PRN bladder spasms 4. Clear liquid, advance as tolerated 5. Continue Foley until at least POD1  Attestation:  Dr. Jeffie Pollock was present and scrubbed for the entirety of the procedure.    Sharmaine Base, MD  Resident, Department of Urology

## 2017-02-02 NOTE — Anesthesia Preprocedure Evaluation (Addendum)
Anesthesia Evaluation  Patient identified by MRN, date of birth, ID band Patient awake    Reviewed: Allergy & Precautions, NPO status , Patient's Chart, lab work & pertinent test results  History of Anesthesia Complications (+) PONV and history of anesthetic complications  Airway Mallampati: I       Dental  (+) Teeth Intact, Dental Advisory Given   Pulmonary former smoker,    breath sounds clear to auscultation       Cardiovascular hypertension, Pt. on medications negative cardio ROS   Rhythm:Regular Rate:Normal     Neuro/Psych  Headaches,  Neuromuscular disease    GI/Hepatic Neg liver ROS, GERD  Medicated,  Endo/Other  negative endocrine ROS  Renal/GU Renal disease  negative genitourinary   Musculoskeletal  (+) Arthritis , Osteoarthritis,    Abdominal   Peds negative pediatric ROS (+)  Hematology negative hematology ROS (+)   Anesthesia Other Findings   Reproductive/Obstetrics negative OB ROS                            Anesthesia Physical Anesthesia Plan  ASA: III and emergent  Anesthesia Plan: General   Post-op Pain Management:    Induction: Intravenous  Airway Management Planned:   Additional Equipment:   Intra-op Plan:   Post-operative Plan: Extubation in OR  Informed Consent: I have reviewed the patients History and Physical, chart, labs and discussed the procedure including the risks, benefits and alternatives for the proposed anesthesia with the patient or authorized representative who has indicated his/her understanding and acceptance.   Dental advisory given  Plan Discussed with: CRNA  Anesthesia Plan Comments:         Anesthesia Quick Evaluation

## 2017-02-02 NOTE — H&P (Signed)
History and Physical  Victoria Mosley NWG:[id number] DOB: 04-May-1951 DOA: 02/02/2017  Referring physician: EDP PCP: Alysia Penna, MD   Chief Complaint: right lower quadrant abdominal pain  HPI: Victoria Mosley is a 66 y.o. female   With past h/o kidney stone, neurogenic bladder, h/o uti, h/o iron deficiency anemia who presented to the ED with above complaints, CTab/pel +obstructing right distal ureteral calculus/right hydronephrosis, basic labs unremarkable, but patient is hypotensive, ua + for uti, she received fluids resuscitation, she is started on rocephin after urine and blood culture obtained in the ED, due to dropping blood pressure despite fluids resuscitation, EDP consulted ICU attending, EDP also consulted urology , she was brought to the OR from the ED and had stent placed in. patient is admitted to the stepdown unit from the PACU.  Patient denies fever, no cough, no chest pain, no constipation, no diarrhea.  Review of Systems:  Detail per HPI, Review of systems are otherwise negative  Past Medical History:  Diagnosis Date  . 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 Adenomatous colon polyp 2004  . Arthritis   . Bilateral knee pain    sees Dr. Wynelle Link   . Bilateral renal cysts   . Chronic UTI (urinary tract infection)   . Complication of anesthesia   . Cough 11/30/14    cough since 11/20/14- now productine- green with green mucus out of nose- no fever  . Diverticulitis   . GERD (gastroesophageal reflux disease)   . History of kidney stones   . History of melanoma excision   . History of renal calculi   . 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 Colposcopy inadequate/normal 04/2015  . Hyperlipidemia   . IBS (irritable bowel syndrome)   . Kidney stones    bilateral  . melanoma dx'd 2011   rt arm--surg only  . Menopause    sees Dr. Phineas Real  . Migraines   . Nephrolithiasis    left  . Neurogenic bladder   . Normal  cardiac stress test 2012  . PONV (postoperative nausea and vomiting)   . Tendonitis    right ankle  . Urinary urgency   . UTI (urinary tract infection)    started atb on 03-24-12   Past Surgical History:  Procedure Laterality Date  . BACK SURGERY  10/1991   L5 ruptured disc- Dr.Robinson  . CESAREAN SECTION  12/1980  . COLONOSCOPY  03-28-12   per Dr. Henrene Pastor, polyp,  repeat in 5 yrs  . CYSTOSCOPY WITH RETROGRADE PYELOGRAM, URETEROSCOPY AND STENT PLACEMENT Left 12/25/2012   Procedure: CYSTOSCOPY WITH RETROGRADE PYELOGRAM, URETEROSCOPY, EXTRACTION OF  LEFT STONE WITH BASKETAND STENT PLACEMENT;  Surgeon: Molli Hazard, MD;  Location: [facility];  Service: Urology;  Laterality: Left;  2.5 HRS   . FOOT SURGERY Bilateral 1987   per Dr. Alphonzo Cruise  . HEMATOMA EVACUATION  07/08/2012   Procedure: EVACUATION HEMATOMA;  Surgeon: Kristeen Miss, MD;  Location: [facility];  Service: Neurosurgery;  Laterality: N/A;  Cervical Seven-thoracic One Laminectomy and Decompression of Epidural Hematoma  . HEMORRHOID BANDING     June and August 2016, Dr Rosendo Gros  . KIDNEY STONE SURGERY  2005   per Dr. Reece Agar  . LEEP  07/2015   final pathology LGSIL with clear margins negative ECC  . LITHOTRIPSY  2007   x 3  . Funk, at L5  per Dr. Quentin Cornwall  . MELANOMA EXCISION  2007   left upper arm per dr. Teressa Senter  . NASAL SEPTUM SURGERY  1992   per Dr. Cynda Familia  . POSTERIOR CERVICAL LAMINECTOMY  07/08/2012   Procedure: POSTERIOR CERVICAL LAMINECTOMY;  Surgeon: Kristeen Miss, MD;  Location: [place] [facility];  Service: Neurosurgery;  Laterality: N/A;  Cervical Seven- thoracic One Laminectomy and Decompression of Epidural Hematoma  . TOTAL HIP ARTHROPLASTY Right 2010   [place]  . TOTAL HIP ARTHROPLASTY Left 09/27/2016   Procedure: LEFT TOTAL HIP ARTHROPLASTY ANTERIOR APPROACH;  Surgeon: Gaynelle Arabian, MD;  Location: [facility];  Service: Orthopedics;  Laterality: Left;  . TOTAL KNEE  ARTHROPLASTY Right 12/07/2014   Procedure: RIGHT TOTAL KNEE ARTHROPLASTY;  Surgeon: [name], MD;  Location: [facility];  Service: Orthopedics;  Laterality: Right;  . TUBAL LIGATION  01/1988   Social History:  reports that she quit smoking about 18 years ago. She smoked 0.25 packs per day. She has never used smokeless tobacco. She reports that she does not drink alcohol or use drugs. Patient lives at home & is able to participate in activities of daily living independently   Allergies  Allergen Reactions  . Levaquin [Levofloxacin In D5w] Anaphylaxis, Rash and Other (See Comments)    Hot, dizziness, diarrhea  . Pravastatin Sodium [Pravachol]     Joint pain(severe)  . Biaxin [Clarithromycin] Nausea And Vomiting  . Ciprofloxacin Nausea Only  . Clarithromycin Nausea Only  . Dust Mite Extract Other (See Comments)      Grass, trees also- cause Runny nose, sneezing (allergy symptoms)  . Septra [Sulfamethoxazole-Trimethoprim]     diarrhea    Family History  Problem Relation Age of Onset  . Heart disease Mother   . Hypertension Mother   . Diabetes Mother   . Hypertension Father   . Leukemia Father   . Hypertension Sister   . Heart disease Sister   . Bladder Cancer Sister   . Stomach cancer Paternal Grandmother   . Colon cancer Neg Hx   . Esophageal cancer Neg Hx   . Rectal cancer Neg Hx       Prior to Admission medications   Medication Sig Start Date End Date Taking? Authorizing Provider  acetaminophen (TYLENOL) 500 MG tablet Take 1,000 mg by mouth every 6 (six) hours as needed for mild pain.   Yes Historical Provider, MD  Butalbital-APAP-Caffeine 50-300-40 MG CAPS Take 1 capsule by mouth 2 (two) times daily as needed (headache). 11/21/16  Yes Laurey Morale, MD  Cranberry 500 MG TABS Take by mouth 2 (two) times daily.   Yes Historical Provider, MD  diphenhydrAMINE (BENADRYL) 25 mg capsule Take 25 mg by mouth at bedtime.   Yes Historical Provider, MD  estropipate (OGEN) 0.75 MG  tablet Take 1 tablet (0.75 mg total) by mouth daily. 11/03/16  Yes [name], MD  ezetimibe (ZETIA) 10 MG tablet Take 1 tablet (10 mg total) by mouth daily. 11/09/16  Yes Laurey Morale, MD  hydrochlorothiazide (HYDRODIURIL) 25 MG tablet Take 1 tablet (25 mg total) by mouth daily. 11/09/16  Yes Laurey Morale, MD  loratadine (CLARITIN) 10 MG tablet Take 10 mg by mouth daily.    Yes Historical Provider, MD  medroxyPROGESTERone (PROVERA) 2.5 MG tablet Take 1 tablet (2.5 mg total) by mouth daily. 11/03/16  Yes [name], MD  naproxen sodium (ANAPROX) 220 MG tablet Take 220 mg by mouth 4 (four) times daily as needed.   Yes  Historical Provider, MD  omeprazole (PRILOSEC) 20 MG capsule Take 1 capsule (20 mg total) by mouth daily. 11/09/16  Yes Laurey Morale, MD  polyethylene glycol [facility] / GLYCOLAX) packet Take 17 g by mouth daily. States takes 1/3 packet at bedtime   Yes Historical Provider, MD  potassium gluconate 595 MG TABS Take 1,190 mg by mouth every evening.    Yes Historical Provider, MD  Probiotic Product (PROBIOTIC PO) Take by mouth. Takes 5mg  once daily.   Yes Historical Provider, MD  propranolol (INDERAL) 40 MG tablet Take 1 tablet (40 mg total) by mouth daily. 11/09/16  Yes Laurey Morale, MD  diazepam (VALIUM) 5 MG tablet Take 1 tablet (5 mg total) by mouth every 6 (six) hours as needed for muscle spasms. Patient not taking: Reported on 01/29/2017 08/09/16   Laurey Morale, MD  fluconazole (DIFLUCAN) 150 MG tablet TAKE 1 TABLET (150 MG TOTAL) BY MOUTH ONCE FOR 1 DOSE. Patient not taking: Reported on 01/29/2017 12/19/16   Laurey Morale, MD  hyoscyamine (LEVSIN SL) 0.125 MG SL tablet Place 1 tab on the tongue to dissolve at the at the onset of cramping, spasms then every 4 hours as needed. Patient not taking: Reported on 02/02/2017 08/09/16   Laurey Morale, MD  predniSONE (DELTASONE) 5 MG tablet Take 1-2 tablets (5-10 mg total) by mouth daily with breakfast. ( for mouth ulcers ) Patient not  taking: Reported on 01/29/2017 11/22/16   Laurey Morale, MD    Physical Exam: BP (!) 97/53   Pulse 89   Temp 99.1 F (37.3 C) (Oral)   Resp 17   Ht 5\' 7"  (1.702 m)   Wt 87.5 kg (193 lb)   SpO2 93%   BMI 30.23 kg/m   General:  NAD Eyes: PERRL ENT: unremarkable Neck: supple, no JVD Cardiovascular: RRR Respiratory: CTABL Abdomen: soft/ND/ND, positive bowel sounds Skin: no rash Musculoskeletal:  No edema Psychiatric: calm/cooperative Neurologic: no focal findings            Labs on Admission:  Basic Metabolic Panel:  Recent Labs Lab 02/02/17 0824  NA 141  K 3.6  CL 105  CO2 26  GLUCOSE 132*  BUN 19  CREATININE 0.91  CALCIUM 9.5   Liver Function Tests:  Recent Labs Lab 02/02/17 0824  AST 34  ALT 25  ALKPHOS 63  BILITOT 0.6  PROT 7.2  ALBUMIN 3.9    Recent Labs Lab 02/02/17 0824  LIPASE 19   No results for input(s): AMMONIA in the last 168 hours. CBC:  Recent Labs Lab 02/02/17 0824  WBC 9.5  HGB 10.7*  HCT 35.6*  MCV 69.4*  PLT 251   Cardiac Enzymes: No results for input(s): CKTOTAL, CKMB, CKMBINDEX, TROPONINI in the last 168 hours.  BNP (last 3 results) No results for input(s): BNP in the last 8760 hours.  ProBNP (last 3 results) No results for input(s): PROBNP in the last 8760 hours.  CBG: No results for input(s): GLUCAP in the last 168 hours.  Radiological Exams on Admission: Ct Abdomen Pelvis W Contrast  Result Date: 02/02/2017 CLINICAL DATA:  Right lower quadrant abdominal pain. Upper and lower endoscopy a few days prior. EXAM: CT ABDOMEN AND PELVIS WITH CONTRAST TECHNIQUE: Multidetector CT imaging of the abdomen and pelvis was performed using the standard protocol following bolus administration of intravenous contrast. CONTRAST:  139mL ISOVUE-300 IOPAMIDOL (ISOVUE-300) INJECTION 61% COMPARISON:  08/28/2012 CT abdomen/ pelvis. FINDINGS: Lower chest: Mild bibasilar scarring versus atelectasis. Hepatobiliary:  Diffuse hepatic steatosis.  Simple 1.0 cm lateral segment left liver lobe cyst. No additional liver lesions. Normal gallbladder with no radiopaque cholelithiasis. No biliary ductal dilatation. Pancreas: Normal, with no mass or duct dilation. Spleen: Normal size. No mass. Adrenals/Urinary Tract: Normal adrenals. Obstructing 7 x 5 mm stone in the distal right pelvic ureter with mild right hydroureteronephrosis and asymmetric right perinephric fat stranding. Slight urothelial wall thickening throughout the right renal collecting system and right ureter. No left hydronephrosis. subcentimeter hypodense renal cortical lesions in both kidneys are too small to characterize and require no further follow-up. Limited bladder visualization due to streak artifact, with no gross bladder abnormality. Stomach/Bowel: Grossly normal stomach. Normal caliber small bowel with no small bowel wall thickening. Normal appendix. Mild sigmoid diverticulosis, with no evidence of large bowel wall thickening or pericolonic fat stranding. Vascular/Lymphatic: Atherosclerotic nonaneurysmal abdominal aorta. Patent portal, splenic, hepatic and renal veins. No pathologically enlarged lymph nodes in the abdomen or pelvis. Reproductive: Grossly stable enlarged myomatous uterus with dominant 4.8 cm left uterine fibroid. No adnexal mass. Other: No pneumoperitoneum, ascites or focal fluid collection. Small fat containing umbilical hernia. Musculoskeletal: No aggressive appearing focal osseous lesions. Partially visualized bilateral total hip arthroplasty. Marked degenerative disc disease at L5-S1. IMPRESSION: 1. Obstructing 7 x 5 mm distal right pelvic ureteral stone with mild right hydroureteronephrosis. Minimal urothelial wall thickening throughout the right urinary tract is probably due to the acute obstruction, although a superimposed ascending right urinary tract infection is difficult to exclude. 2. Aortic atherosclerosis . 3. Diffuse hepatic steatosis. 4. Mild sigmoid  diverticulosis, with no evidence of acute diverticulitis. 5. Enlarged myomatous uterus. 6. Small fat containing umbilical hernia. Electronically Signed   By: Ilona Sorrel M.D.   On: 02/02/2017 11:24      Assessment/Plan Present on Admission: **None**   Sepsis presented on admission with uit/obstructed stone/ hypotension required 3liter ivf boluses  Sepsis from uti/pyelonephritis, continue ivf, rocephin, start stress dose steroids, hold home meds hctz. Urine culture/blood culture pending  uti/right pyelonephritis/ 7mm right distal ureteral calculus/right hydronephrosis,  s/p Right ureteral stent placement, will follow urology recommendations Urine culture pending, continue rocephin for now   Iron deficiency anemia: received iv iron recently, she underwent egd and colonoscopy on Monday, she is to get capsule study in 1-2 weeks. hgb stable at baseline, She is to follow up with gi.  DVT prophylaxis: lovenox  Consultants: urology  Code Status: full   Family Communication:  Patient and husband at  bedside  Disposition Plan: admit to stepdown,   Time spent: 59mins  Aaralynn Shepheard MD, PhD [facility] Pager [phone] If 7PM-7AM, please contact night-coverage at [email or web address], password [facility]

## 2017-02-02 NOTE — Telephone Encounter (Signed)
Message routed to Dr. Henrene Pastor.

## 2017-02-02 NOTE — ED Triage Notes (Signed)
Patient is complaining of RLQ abdominal pain 4am. Patient has hx of kidney stones. Patient states this pain feels similar to her past kidney stones.  Patient had colonoscopy on Monday.

## 2017-02-02 NOTE — ED Notes (Signed)
Patient transported to CT 

## 2017-02-02 NOTE — Anesthesia Postprocedure Evaluation (Addendum)
Anesthesia Post Note  Patient: Victoria Mosley  Procedure(s) Performed: Procedure(s) (LRB): CYSTOSCOPY WITH RETROGRADE PYELOGRAM/URETERAL STENT PLACEMENT (Right)  Patient location during evaluation: PACU Anesthesia Type: General Level of consciousness: awake and alert Pain management: pain level controlled Vital Signs Assessment: post-procedure vital signs reviewed and stable Respiratory status: spontaneous breathing, nonlabored ventilation, respiratory function stable and patient connected to nasal cannula oxygen Cardiovascular status: blood pressure returned to baseline and stable Postop Assessment: no signs of nausea or vomiting Anesthetic complications: no       Last Vitals:  Vitals:   02/02/17 1900 02/02/17 1908  BP: (!) 92/53 (!) 94/54  Pulse: 80 80  Resp: 18 19  Temp:      Last Pain:  Vitals:   02/02/17 1830  TempSrc:   PainSc: 2                  Effie Berkshire

## 2017-02-02 NOTE — Consult Note (Signed)
New Urology Consult Note   Requesting Attending Physician:  Leo Grosser, MD Service Providing Consult: Urology Consulting Attending: Jeffie Pollock, MD  Assessment:  Patient is a 66 y.o. female with history of nephrolithiasis, recurrent UTI, neurogenic bladder and other medical problems as listed in HPI presents with at least 70mm right distal ureteral calculus, infected appearing urinalysis, hypotension with right perinephric stranding, collecting system enhancement suggestive of right ascending urinary tract infection, and mild hydronephrosis.  Recommendations: 1. Please keep NPO. We will plan to proceed with cystoscopy, right retrograde pyelogram and right ureteral stent placement to occur this afternoon 2. Recommend admission with medical service (may require stepdown or higher level of care if BP does not respond) with urologic consultation 3. Follow up urine & blood cultures. Agree with CTX   Thank you for this consult. Please contact the  urology consult pager with any further questions/concerns. Sharmaine Base, MD Urology Surgical Resident   HPI -   Reason for Consult:  Right distal ureter stone  Victoria Mosley is seen in consultation for reasons noted above at the request of Leo Grosser, MD.  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 She is a patient of Dr. Louis Meckel. 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 She last had ureteroscopy 11/2012 on the left. She has multiple E coli positive urine cultures on file, mostly pan-sensitive or with limited resistance.   She tells me that at 4:00 this morning she woke up with suprapubic pain she first attributed to constipation or an IBS flare. She strained to have a bowel movement. She then noticed increasing, worsening right lower quadrant pain that she thought may be similar to previous kidney stone episodes or appendicitis. This pain reached a 10  out of 10. It was associated with severe nausea and dry heaves but no emesis. She denies fevers but has had chills this morning. All of her symptoms started today. She was at work yesterday and feeling overall quite normal. She just recently had a colonoscopy and again felt that all of her symptoms were related to bowel issues. Her last meal was at 7:00 last evening she has just had water today with sips for medications. She denies any gross hematuria. She does have a little dysuria.  She also carries a history of migraines, HLD, OA.   Objectively she is afebrile and her heart rate is normal. Her blood pressures are soft, 92/44 in the chart before I see her in 100s over 60s when I see her. At the time of writing this note I realized that her blood pressure has been recorded is 86/44 as well. Her creatinine is 0.91. Her white blood cell count is 9. Her urinalysis shows few bacteria, large leukocytes, too numerous to count white blood cells and 6-30 red blood cells per high-power field. Blood cultures and urine culture pending. She is received 1 g of ceftriaxone as well as 2 L of normal saline. She has received Tylenol and Toradol for pain control. CT imaging shows a large distal right ureteral calculus over 10 mm in size with right perinephric stranding, mild hydronephrosis but no significant hydroureter with periureteral enhancement with IV contrast. She is also noted to have a large fibroid uterus. Incidentally she is again noted to have multiple small lesions, likely cysts, in the bilateral kidneys.   Past Medical History: Past Medical History:  Diagnosis Date  . Acne    and granuloma annulare,  sees Dr. Allyson Sabal   . Adenomatous colon polyp 2004  . Arthritis   . Bilateral knee pain    sees Dr. Wynelle Link   . Bilateral renal cysts   . Chronic UTI (urinary tract infection)   . Complication of anesthesia   . Cough 11/30/14    cough since 11/20/14- now productine- green with green mucus out of nose- no  fever  . Diverticulitis   . GERD (gastroesophageal reflux disease)   . History of kidney stones   . History of melanoma excision   . History of renal calculi   . 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 Colposcopy inadequate/normal 04/2015  . Hyperlipidemia   . IBS (irritable bowel syndrome)   . Kidney stones    bilateral  . melanoma dx'd 2011   rt arm--surg only  . Menopause    sees Dr. Phineas Real  . Migraines   . Nephrolithiasis    left  . Neurogenic bladder   . Normal cardiac stress test 2012  . PONV (postoperative nausea and vomiting)   . Tendonitis    right ankle  . Urinary urgency   . UTI (urinary tract infection)    started atb on 03-24-12    Past Surgical History:  Past Surgical History:  Procedure Laterality Date  . BACK SURGERY  10/1991   L5 ruptured disc- Dr.Robinson  . CESAREAN SECTION  12/1980  . COLONOSCOPY  03-28-12   per Dr. Henrene Pastor, polyp,  repeat in 5 yrs  . CYSTOSCOPY WITH RETROGRADE PYELOGRAM, URETEROSCOPY AND STENT PLACEMENT Left 12/25/2012   Procedure: CYSTOSCOPY WITH RETROGRADE PYELOGRAM, URETEROSCOPY, EXTRACTION OF  LEFT STONE WITH BASKETAND STENT PLACEMENT;  Surgeon: Molli Hazard, MD;  Location: [facility];  Service: Urology;  Laterality: Left;  2.5 HRS   . FOOT SURGERY Bilateral 1987   per Dr. Alphonzo Cruise  . HEMATOMA EVACUATION  07/08/2012   Procedure: EVACUATION HEMATOMA;  Surgeon: Kristeen Miss, MD;  Location: [place] [facility];  Service: Neurosurgery;  Laterality: N/A;  Cervical Seven-thoracic One Laminectomy and Decompression of Epidural Hematoma  . HEMORRHOID BANDING     June and August 2016, Dr Rosendo Gros  . KIDNEY STONE SURGERY  2005   per Dr. Reece Agar  . LEEP  07/2015   final pathology LGSIL with clear margins negative ECC  . LITHOTRIPSY  2007   x 3  . [place], at L5 per Dr. Quentin Cornwall  . MELANOMA EXCISION  2007   left upper arm per dr. Teressa Senter  . NASAL SEPTUM  SURGERY  1992   per Dr. Cynda Familia  . POSTERIOR CERVICAL LAMINECTOMY  07/08/2012   Procedure: POSTERIOR CERVICAL LAMINECTOMY;  Surgeon: Kristeen Miss, MD;  Location: [place] [facility];  Service: Neurosurgery;  Laterality: N/A;  Cervical Seven- thoracic One Laminectomy and Decompression of Epidural Hematoma  . TOTAL HIP ARTHROPLASTY Right 2010   [place]  . TOTAL HIP ARTHROPLASTY Left 09/27/2016   Procedure: LEFT TOTAL HIP ARTHROPLASTY ANTERIOR APPROACH;  Surgeon: Gaynelle Arabian, MD;  Location: [facility];  Service: Orthopedics;  Laterality: Left;  . TOTAL KNEE ARTHROPLASTY Right 12/07/2014   Procedure: RIGHT TOTAL KNEE ARTHROPLASTY;  Surgeon: [name], MD;  Location: [facility];  Service: Orthopedics;  Laterality: Right;  . TUBAL LIGATION  01/1988    Medication: Current Facility-Administered Medications  Medication Dose Route Frequency Provider Last Rate Last Dose  . 0.9 %  sodium chloride infusion  500 mL Intravenous Continuous  Irene Shipper, MD      . fentaNYL (SUBLIMAZE) injection 50 mcg  50 mcg Intravenous Q1H PRN Leo Grosser, MD   50 mcg at 02/02/17 1344  . lactated ringers bolus 1,000 mL  1,000 mL Intravenous Once Leo Grosser, MD   1,000 mL at 02/02/17 1453  . ondansetron (ZOFRAN) injection 4 mg  4 mg Intravenous Q1H PRN Leo Grosser, MD   4 mg at 02/02/17 7510   Current Outpatient Prescriptions  Medication Sig Dispense Refill  . acetaminophen (TYLENOL) 500 MG tablet Take 1,000 mg by mouth every 6 (six) hours as needed for mild pain.    . Butalbital-APAP-Caffeine 50-300-40 MG CAPS Take 1 capsule by mouth 2 (two) times daily as needed (headache). 84 capsule 2  . Cranberry 500 MG TABS Take by mouth 2 (two) times daily.    . diphenhydrAMINE (BENADRYL) 25 mg capsule Take 25 mg by mouth at bedtime.    Marland Kitchen estropipate (OGEN) 0.75 MG tablet Take 1 tablet (0.75 mg total) by mouth daily. 90 tablet 2  . ezetimibe (ZETIA) 10 MG tablet Take 1 tablet (10 mg total) by mouth daily. 90 tablet 3  .  hydrochlorothiazide (HYDRODIURIL) 25 MG tablet Take 1 tablet (25 mg total) by mouth daily. 90 tablet 3  . loratadine (CLARITIN) 10 MG tablet Take 10 mg by mouth daily.     . medroxyPROGESTERone (PROVERA) 2.5 MG tablet Take 1 tablet (2.5 mg total) by mouth daily. 90 tablet 2  . naproxen sodium (ANAPROX) 220 MG tablet Take 220 mg by mouth 4 (four) times daily as needed.    Marland Kitchen omeprazole (PRILOSEC) 20 MG capsule Take 1 capsule (20 mg total) by mouth daily. 90 capsule 3  . polyethylene glycol (MIRALAX / GLYCOLAX) packet Take 17 g by mouth daily. States takes 1/3 packet at bedtime    . potassium gluconate 595 MG TABS Take 1,190 mg by mouth every evening.     . Probiotic Product (PROBIOTIC PO) Take by mouth. Takes 5mg  once daily.    . propranolol (INDERAL) 40 MG tablet Take 1 tablet (40 mg total) by mouth daily. 90 tablet 3  . diazepam (VALIUM) 5 MG tablet Take 1 tablet (5 mg total) by mouth every 6 (six) hours as needed for muscle spasms. (Patient not taking: Reported on 01/29/2017) 90 tablet 1  . fluconazole (DIFLUCAN) 150 MG tablet TAKE 1 TABLET (150 MG TOTAL) BY MOUTH ONCE FOR 1 DOSE. (Patient not taking: Reported on 01/29/2017) 3 tablet 11  . hyoscyamine (LEVSIN SL) 0.125 MG SL tablet Place 1 tab on the tongue to dissolve at the at the onset of cramping, spasms then every 4 hours as needed. (Patient not taking: Reported on 02/02/2017) 90 tablet 5  . predniSONE (DELTASONE) 5 MG tablet Take 1-2 tablets (5-10 mg total) by mouth daily with breakfast. ( for mouth ulcers ) (Patient not taking: Reported on 01/29/2017) 60 tablet 2    Allergies: Allergies  Allergen Reactions  . Levaquin [Levofloxacin In D5w] Anaphylaxis, Rash and Other (See Comments)    Hot, dizziness, diarrhea  . Pravastatin Sodium [Pravachol]     Joint pain(severe)  . Biaxin [Clarithromycin] Nausea And Vomiting  . Ciprofloxacin Nausea Only  . Clarithromycin Nausea Only  . Dust Mite Extract Other (See Comments)      Grass, trees also- cause  Runny nose, sneezing (allergy symptoms)  . Septra [Sulfamethoxazole-Trimethoprim]     diarrhea    Social History: Social History  Substance Use Topics  . Smoking  status: Former Smoker    Packs/day: 0.25    Quit date: 12/20/1998  . Smokeless tobacco: Never Used  . Alcohol use No    Family History Family History  Problem Relation Age of Onset  . Heart disease Mother   . Hypertension Mother   . Diabetes Mother   . Hypertension Father   . Leukemia Father   . Hypertension Sister   . Heart disease Sister   . Bladder Cancer Sister   . Stomach cancer Paternal Grandmother   . Colon cancer Neg Hx   . Esophageal cancer Neg Hx   . Rectal cancer Neg Hx     Review of Systems 10 systems were reviewed and are negative except as noted specifically in the HPI.  Objective   Vital signs in last 24 hours: BP (!) 103/55   Pulse 90   Temp 99.1 F (37.3 C) (Oral)   Resp 17   Ht 5\' 7"  (1.702 m)   Wt 87.5 kg (193 lb)   SpO2 93%   BMI 30.23 kg/m   Intake/Output last 3 shifts: No intake/output data recorded.  Physical Exam General: NAD, A&O, lights turned off, towel over head, speaks in quite tone HEENT: [place]/AT, EOMI, MMM Pulmonary: Normal work of breathing on RA Cardiovascular: Regular rate & rhythm, HDS, adequate peripheral perfusion Abdomen: soft, TTP in RLQ, no rebound tenderness, nondistended, no suprapubic fullness or tenderness; warm to touch GU: no Foley present, no CVA tenderness appreciable Extremities: warm and well perfused, no edema  Most Recent Labs: Lab Results  Component Value Date   WBC 9.5 02/02/2017   HGB 10.7 (L) 02/02/2017   HCT 35.6 (L) 02/02/2017   PLT 251 02/02/2017    Lab Results  Component Value Date   NA 141 02/02/2017   K 3.6 02/02/2017   CL 105 02/02/2017   CO2 26 02/02/2017   BUN 19 02/02/2017   CREATININE 0.91 02/02/2017   CALCIUM 9.5 02/02/2017    Lab Results  Component Value Date   ALKPHOS 63 02/02/2017   BILITOT 0.6 02/02/2017    BILIDIR 0.1 12/29/2016   PROT 7.2 02/02/2017   ALBUMIN 3.9 02/02/2017   ALT 25 02/02/2017   AST 34 02/02/2017    Lab Results  Component Value Date   INR 1.04 09/18/2016   APTT 40 (H) 09/18/2016     Urine Culture: Pending   IMAGING: Ct Abdomen Pelvis W Contrast  Result Date: 02/02/2017 CLINICAL DATA:  Right lower quadrant abdominal pain. Upper and lower endoscopy a few days prior. EXAM: CT ABDOMEN AND PELVIS WITH CONTRAST TECHNIQUE: Multidetector CT imaging of the abdomen and pelvis was performed using the standard protocol following bolus administration of intravenous contrast. CONTRAST:  164mL ISOVUE-300 IOPAMIDOL (ISOVUE-300) INJECTION 61% COMPARISON:  08/28/2012 CT abdomen/ pelvis. FINDINGS: Lower chest: Mild bibasilar scarring versus atelectasis. Hepatobiliary: Diffuse hepatic steatosis. Simple 1.0 cm lateral segment left liver lobe cyst. No additional liver lesions. Normal gallbladder with no radiopaque cholelithiasis. No biliary ductal dilatation. Pancreas: Normal, with no mass or duct dilation. Spleen: Normal size. No mass. Adrenals/Urinary Tract: Normal adrenals. Obstructing 7 x 5 mm stone in the distal right pelvic ureter with mild right hydroureteronephrosis and asymmetric right perinephric fat stranding. Slight urothelial wall thickening throughout the right renal collecting system and right ureter. No left hydronephrosis. subcentimeter hypodense renal cortical lesions in both kidneys are too small to characterize and require no further follow-up. Limited bladder visualization due to streak artifact, with no gross bladder abnormality. Stomach/Bowel: Grossly normal  stomach. Normal caliber small bowel with no small bowel wall thickening. Normal appendix. Mild sigmoid diverticulosis, with no evidence of large bowel wall thickening or pericolonic fat stranding. Vascular/Lymphatic: Atherosclerotic nonaneurysmal abdominal aorta. Patent portal, splenic, hepatic and renal veins. No  pathologically enlarged lymph nodes in the abdomen or pelvis. Reproductive: Grossly stable enlarged myomatous uterus with dominant 4.8 cm left uterine fibroid. No adnexal mass. Other: No pneumoperitoneum, ascites or focal fluid collection. Small fat containing umbilical hernia. Musculoskeletal: No aggressive appearing focal osseous lesions. Partially visualized bilateral total hip arthroplasty. Marked degenerative disc disease at L5-S1. IMPRESSION: 1. Obstructing 7 x 5 mm distal right pelvic ureteral stone with mild right hydroureteronephrosis. Minimal urothelial wall thickening throughout the right urinary tract is probably due to the acute obstruction, although a superimposed ascending right urinary tract infection is difficult to exclude. 2. Aortic atherosclerosis . 3. Diffuse hepatic steatosis. 4. Mild sigmoid diverticulosis, with no evidence of acute diverticulitis. 5. Enlarged myomatous uterus. 6. Small fat containing umbilical hernia. Electronically Signed   By: Ilona Sorrel M.D.   On: 02/02/2017 11:24    I have discussed her case with Dr. Laneta Simmers and Dr. Burnett Harry and reviewed her pertinent labs and films.  I have reviewed the history and examined Victoria Mosley.  She has probable infection with hypotension with the right distal stone and we will proceed with stent insertion.

## 2017-02-02 NOTE — ED Provider Notes (Signed)
[facility] DEPT Provider Note   CSN: [id number] Arrival date & time: 02/02/17  0804     History   Chief Complaint Chief Complaint  Patient presents with  . Abdominal Pain    HPI Henchy Mccauley Walt is a 66 y.o. female.  The history is provided by the patient.  Abdominal Pain   This is a new problem. The current episode started 3 to 5 hours ago. The problem occurs constantly. The problem has not changed since onset.The pain is associated with eating. The pain is located in the suprapubic region and RLQ. The quality of the pain is sharp. The pain is moderate. Associated symptoms include nausea and vomiting (dry heaving this morning). Pertinent negatives include fever, diarrhea, dysuria and hematuria. Associated symptoms comments: Hard stool this morning. Nothing aggravates the symptoms. Nothing relieves the symptoms. Past workup comments: colonoscopy, irone deficiency anemia last received iron infusion yesterday. Her past medical history is significant for irritable bowel syndrome.    Past Medical History:  Diagnosis Date  . 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 Adenomatous colon polyp 2004  . Arthritis   . Bilateral knee pain    sees Dr. Wynelle Link   . Bilateral renal cysts   . Chronic UTI (urinary tract infection)   . Complication of anesthesia   . Cough 11/30/14    cough since 11/20/14- now productine- green with green mucus out of nose- no fever  . Diverticulitis   . GERD (gastroesophageal reflux disease)   . History of kidney stones   . History of melanoma excision   . History of renal calculi   . 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 Colposcopy inadequate/normal 04/2015  . Hyperlipidemia   . IBS (irritable bowel syndrome)   . Kidney stones    bilateral  . Menopause    sees Dr. Phineas Real  . Migraines   . Nephrolithiasis    left  . Neurogenic bladder   . Normal cardiac stress test 2012  . PONV (postoperative  nausea and vomiting)   . Tendonitis    right ankle  . Urinary urgency   . UTI (urinary tract infection)    started atb on 03-24-12    Patient Active Problem List   Diagnosis Date Noted  . OA (osteoarthritis) of hip 09/27/2016  . S/P total knee arthroplasty 12/19/2014  . Intractable back pain 12/19/2014  . Sciatica of left side 12/19/2014  . Migraines 12/19/2014  . DDD (degenerative disc disease), lumbar 12/19/2014  . Fever 12/19/2014  . Left-sided low back pain with left-sided sciatica   . Status post total right knee replacement   . OA (osteoarthritis) of knee 12/07/2014  . 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 Migraine with aura, intractable 08/06/2012  . Overactive bladder 07/31/2012  . Traumatic epidural hematoma ([place]) 07/12/2012  . IBS (irritable bowel syndrome) 07/26/2011  . OSTEOARTHRITIS 01/26/2010  . NEPHROLITHIASIS, HX OF 01/26/2010  . DIVERTICULITIS, COLON 11/02/2008  . Hyperlipidemia 02/25/2008  . COMMON MIGRAINE 10/17/2007  . ALLERGIC RHINITIS 06/19/2007  . GERD 06/19/2007    Past Surgical History:  Procedure Laterality Date  . BACK SURGERY  10/1991   L5 ruptured disc- Dr.Robinson  . CESAREAN SECTION  12/1980  . COLONOSCOPY  03-28-12   per Dr. Henrene Pastor, polyp,  repeat in 5 yrs  . CYSTOSCOPY WITH RETROGRADE PYELOGRAM, URETEROSCOPY AND STENT PLACEMENT  Left 12/25/2012   Procedure: CYSTOSCOPY WITH RETROGRADE PYELOGRAM, URETEROSCOPY, EXTRACTION OF  LEFT STONE WITH BASKETAND STENT PLACEMENT;  Surgeon: Molli Hazard, MD;  Location: [facility];  Service: Urology;  Laterality: Left;  2.5 HRS   . FOOT SURGERY Bilateral 1987   per Dr. Alphonzo Cruise  . HEMATOMA EVACUATION  07/08/2012   Procedure: EVACUATION HEMATOMA;  Surgeon: Kristeen Miss, MD;  Location: [facility];  Service: Neurosurgery;  Laterality: N/A;  Cervical Seven-thoracic One Laminectomy and Decompression of Epidural  Hematoma  . HEMORRHOID BANDING     June and August 2016, Dr Rosendo Gros  . KIDNEY STONE SURGERY  2005   per Dr. Reece Agar  . LEEP  07/2015   final pathology LGSIL with clear margins negative ECC  . LITHOTRIPSY  2007   x 3  . Dola, at L5 per Dr. Quentin Cornwall  . MELANOMA EXCISION  2007   left upper arm per dr. Teressa Senter  . NASAL SEPTUM SURGERY  1992   per Dr. Cynda Familia  . POSTERIOR CERVICAL LAMINECTOMY  07/08/2012   Procedure: POSTERIOR CERVICAL LAMINECTOMY;  Surgeon: Kristeen Miss, MD;  Location: [facility];  Service: Neurosurgery;  Laterality: N/A;  Cervical Seven- thoracic One Laminectomy and Decompression of Epidural Hematoma  . TOTAL HIP ARTHROPLASTY Right 2010   [place]  . TOTAL HIP ARTHROPLASTY Left 09/27/2016   Procedure: LEFT TOTAL HIP ARTHROPLASTY ANTERIOR APPROACH;  Surgeon: Gaynelle Arabian, MD;  Location: [facility];  Service: Orthopedics;  Laterality: Left;  . TOTAL KNEE ARTHROPLASTY Right 12/07/2014   Procedure: RIGHT TOTAL KNEE ARTHROPLASTY;  Surgeon: [name], MD;  Location: [facility];  Service: Orthopedics;  Laterality: Right;  . TUBAL LIGATION  01/1988    OB History    Gravida Para Term Preterm AB Living   1 1 1     1    SAB TAB Ectopic Multiple Live Births                   Home Medications    Prior to Admission medications   Medication Sig Start Date End Date Taking? Authorizing Provider  acetaminophen (TYLENOL) 500 MG tablet Take 1,000 mg by mouth every 6 (six) hours as needed for mild pain.   Yes Historical Provider, MD  Butalbital-APAP-Caffeine 50-300-40 MG CAPS Take 1 capsule by mouth 2 (two) times daily as needed (headache). 11/21/16  Yes Laurey Morale, MD  Cranberry 500 MG TABS Take by mouth 2 (two) times daily.   Yes Historical Provider, MD  diphenhydrAMINE (BENADRYL) 25 mg capsule Take 25 mg by mouth at bedtime.   Yes Historical Provider, MD  estropipate (OGEN) 0.75 MG tablet Take 1 tablet (0.75 mg total) by mouth daily. 11/03/16  Yes [name], MD  ezetimibe (ZETIA) 10 MG tablet Take 1 tablet (10 mg total) by mouth daily. 11/09/16  Yes Laurey Morale, MD  hydrochlorothiazide (HYDRODIURIL) 25 MG tablet Take 1 tablet (25 mg total) by mouth daily. 11/09/16  Yes Laurey Morale, MD  loratadine (CLARITIN) 10 MG tablet Take 10 mg by mouth daily.    Yes Historical Provider, MD  medroxyPROGESTERone (PROVERA) 2.5 MG tablet Take 1 tablet (2.5 mg total) by mouth daily. 11/03/16  Yes [name], MD  naproxen sodium (ANAPROX) 220 MG tablet Take 220 mg by mouth 4 (four) times daily as needed.   Yes Historical Provider, MD  omeprazole (PRILOSEC) 20 MG capsule Take 1 capsule (20 mg total) by mouth  daily. 11/09/16  Yes Laurey Morale, MD  polyethylene glycol [facility] / GLYCOLAX) packet Take 17 g by mouth daily. States takes 1/3 packet at bedtime   Yes Historical Provider, MD  potassium gluconate 595 MG TABS Take 1,190 mg by mouth every evening.    Yes Historical Provider, MD  Probiotic Product (PROBIOTIC PO) Take by mouth. Takes 5mg  once daily.   Yes Historical Provider, MD  propranolol (INDERAL) 40 MG tablet Take 1 tablet (40 mg total) by mouth daily. 11/09/16  Yes Laurey Morale, MD  diazepam (VALIUM) 5 MG tablet Take 1 tablet (5 mg total) by mouth every 6 (six) hours as needed for muscle spasms. Patient not taking: Reported on 01/29/2017 08/09/16   Laurey Morale, MD  fluconazole (DIFLUCAN) 150 MG tablet TAKE 1 TABLET (150 MG TOTAL) BY MOUTH ONCE FOR 1 DOSE. Patient not taking: Reported on 01/29/2017 12/19/16   Laurey Morale, MD  hyoscyamine (LEVSIN SL) 0.125 MG SL tablet Place 1 tab on the tongue to dissolve at the at the onset of cramping, spasms then every 4 hours as needed. Patient not taking: Reported on 02/02/2017 08/09/16   Laurey Morale, MD  predniSONE (DELTASONE) 5 MG tablet Take 1-2 tablets (5-10 mg total) by mouth daily with breakfast. ( for mouth ulcers ) Patient not taking: Reported on 01/29/2017 11/22/16   Laurey Morale, MD    Family  History Family History  Problem Relation Age of Onset  . Heart disease Mother   . Hypertension Mother   . Diabetes Mother   . Hypertension Father   . Leukemia Father   . Hypertension Sister   . Heart disease Sister   . Bladder Cancer Sister   . Stomach cancer Paternal Grandmother   . Colon cancer Neg Hx   . Esophageal cancer Neg Hx   . Rectal cancer Neg Hx     Social History Social History  Substance Use Topics  . Smoking status: Former Smoker    Packs/day: 0.25    Quit date: 12/20/1998  . Smokeless tobacco: Never Used  . Alcohol use No     Allergies   Levaquin [levofloxacin in d5w]; Pravastatin sodium [pravachol]; Biaxin [clarithromycin]; Ciprofloxacin; Clarithromycin; Dust mite extract; and Septra [sulfamethoxazole-trimethoprim]   Review of Systems Review of Systems  Constitutional: Negative for fever.  Gastrointestinal: Positive for abdominal pain, nausea and vomiting (dry heaving this morning). Negative for diarrhea.  Genitourinary: Negative for dysuria and hematuria.  All other systems reviewed and are negative.    Physical Exam Updated Vital Signs BP (!) 122/54 (BP Location: Right Arm)   Pulse 67   Temp 97.8 F (36.6 C) (Oral)   Resp 20   Ht 5\' 7"  (1.702 m)   Wt 193 lb (87.5 kg)   SpO2 97%   BMI 30.23 kg/m   Physical Exam  Constitutional: She is oriented to person, place, and time. She appears well-developed and well-nourished. She appears distressed (very uncomfortable 2/2 pain).  HENT:  Head: Normocephalic.  Nose: Nose normal.  Eyes: Conjunctivae are normal.  Neck: Neck supple. No tracheal deviation present.  Cardiovascular: Normal rate, regular rhythm and normal heart sounds.   Pulmonary/Chest: Effort normal and breath sounds normal. No respiratory distress.  Abdominal: Soft. She exhibits no distension. There is tenderness in the right lower quadrant. There is guarding and CVA tenderness (on right). There is no rigidity, no rebound and negative  Murphy's sign.  Neurological: She is alert and oriented to person, place, and time.  Skin: Skin is warm and dry.  Psychiatric: She has a normal mood and affect.     ED Treatments / Results  Labs (all labs ordered are listed, but only abnormal results are displayed) Labs Reviewed  COMPREHENSIVE METABOLIC PANEL - Abnormal; Notable for the following:       Result Value   Glucose, Bld 132 (*)    All other components within normal limits  CBC - Abnormal; Notable for the following:    RBC 5.13 (*)    Hemoglobin 10.7 (*)    HCT 35.6 (*)    MCV 69.4 (*)    MCH 20.9 (*)    RDW 25.3 (*)    All other components within normal limits  URINALYSIS, ROUTINE W REFLEX MICROSCOPIC - Abnormal; Notable for the following:    APPearance CLOUDY (*)    Hgb urine dipstick MODERATE (*)    Ketones, ur 5 (*)    Leukocytes, UA LARGE (*)    Bacteria, UA FEW (*)    All other components within normal limits  URINE CULTURE  CULTURE, BLOOD (ROUTINE X 2)  CULTURE, BLOOD (ROUTINE X 2)  LIPASE, BLOOD    EKG  EKG Interpretation None       Radiology Ct Abdomen Pelvis W Contrast  Result Date: 02/02/2017 CLINICAL DATA:  Right lower quadrant abdominal pain. Upper and lower endoscopy a few days prior. EXAM: CT ABDOMEN AND PELVIS WITH CONTRAST TECHNIQUE: Multidetector CT imaging of the abdomen and pelvis was performed using the standard protocol following bolus administration of intravenous contrast. CONTRAST:  155mL ISOVUE-300 IOPAMIDOL (ISOVUE-300) INJECTION 61% COMPARISON:  08/28/2012 CT abdomen/ pelvis. FINDINGS: Lower chest: Mild bibasilar scarring versus atelectasis. Hepatobiliary: Diffuse hepatic steatosis. Simple 1.0 cm lateral segment left liver lobe cyst. No additional liver lesions. Normal gallbladder with no radiopaque cholelithiasis. No biliary ductal dilatation. Pancreas: Normal, with no mass or duct dilation. Spleen: Normal size. No mass. Adrenals/Urinary Tract: Normal adrenals. Obstructing 7 x 5 mm  stone in the distal right pelvic ureter with mild right hydroureteronephrosis and asymmetric right perinephric fat stranding. Slight urothelial wall thickening throughout the right renal collecting system and right ureter. No left hydronephrosis. subcentimeter hypodense renal cortical lesions in both kidneys are too small to characterize and require no further follow-up. Limited bladder visualization due to streak artifact, with no gross bladder abnormality. Stomach/Bowel: Grossly normal stomach. Normal caliber small bowel with no small bowel wall thickening. Normal appendix. Mild sigmoid diverticulosis, with no evidence of large bowel wall thickening or pericolonic fat stranding. Vascular/Lymphatic: Atherosclerotic nonaneurysmal abdominal aorta. Patent portal, splenic, hepatic and renal veins. No pathologically enlarged lymph nodes in the abdomen or pelvis. Reproductive: Grossly stable enlarged myomatous uterus with dominant 4.8 cm left uterine fibroid. No adnexal mass. Other: No pneumoperitoneum, ascites or focal fluid collection. Small fat containing umbilical hernia. Musculoskeletal: No aggressive appearing focal osseous lesions. Partially visualized bilateral total hip arthroplasty. Marked degenerative disc disease at L5-S1. IMPRESSION: 1. Obstructing 7 x 5 mm distal right pelvic ureteral stone with mild right hydroureteronephrosis. Minimal urothelial wall thickening throughout the right urinary tract is probably due to the acute obstruction, although a superimposed ascending right urinary tract infection is difficult to exclude. 2. Aortic atherosclerosis . 3. Diffuse hepatic steatosis. 4. Mild sigmoid diverticulosis, with no evidence of acute diverticulitis. 5. Enlarged myomatous uterus. 6. Small fat containing umbilical hernia. Electronically Signed   By: Ilona Sorrel M.D.   On: 02/02/2017 11:24    Procedures Procedures (including critical care time)  Medications Ordered  in ED Medications  fentaNYL  (SUBLIMAZE) injection 50 mcg (50 mcg Intravenous Given 02/02/17 1344)  ondansetron (ZOFRAN) injection 4 mg (4 mg Intravenous Given 02/02/17 0910)  ketorolac (TORADOL) 30 MG/ML injection 15 mg (15 mg Intravenous Given 02/02/17 0910)  iopamidol (ISOVUE-300) 61 % injection 100 mL (100 mLs Intravenous Contrast Given 02/02/17 1056)  cefTRIAXone (ROCEPHIN) 1 g in dextrose 5 % 50 mL IVPB (0 g Intravenous Stopped 02/02/17 1246)  sodium chloride 0.9 % bolus 1,000 mL (0 mLs Intravenous Stopped 02/02/17 1344)  acetaminophen (TYLENOL) tablet 1,000 mg (1,000 mg Oral Given 02/02/17 1244)  sodium chloride 0.9 % bolus 1,000 mL (0 mLs Intravenous Stopped 02/02/17 1413)  lactated ringers bolus 1,000 mL (1,000 mLs Intravenous New Bag/Given 02/02/17 1453)     Initial Impression / Assessment and Plan / ED Course  I have reviewed the triage vital signs and the nursing notes.  Pertinent labs & imaging results that were available during my care of the patient were reviewed by me and considered in my medical decision making (see chart for details).     5 old female presents with right lower quadrant and right sided abdominal pain that started suddenly over the course of the morning and has continually worsened. She is surely uncomfortable on arrival. Fentanyl and Zofran ordered for symptoms. She has history of prior kidney stones and has not had an appendectomy so differential considerations are obstructing stone and appendicitis or even cholecystitis given the diffuse right-sided nature of her pain so CT with contrast is ordered to further elucidate.  She was found to have a large 7 x 5 mm obstructing stone with hydro-on the right. The stone appears to be infected with heavy pyuria noted on UA. Blood cultures and urine culture ordered. Provided empiric Rocephin and fluid resuscitated for dropping blood pressure. Her map remained above 65 except for brief periods after pain medication administration. Urology was consulted and Dr. Jeffie Pollock  plans to take the patient to the operating room for stent placement later today, they're requesting medical admission. Due to borderline blood pressures and anticipated decompensation following the procedure critical care was called, I discussed with Dr. Madalyn Rob and they will evaluate the patient.  Final Clinical Impressions(s) / ED Diagnoses   Final diagnoses:  Ureterolithiasis  Urinary tract infection with hematuria, site unspecified    New Prescriptions New Prescriptions   No medications on file     Leo Grosser, MD 02/02/17 1610

## 2017-02-02 NOTE — ED Notes (Signed)
ED Provider at bedside. 

## 2017-02-02 NOTE — Progress Notes (Signed)
   [facility] PCCM  ED calls Dr. Oletta Darter for consult for possible ICU care post op.  Pt got 3L IVF prior to being transferred to OR.  By the time I got to the ED, pt had been transferred to OR.   Pls call/page  PCCM  if consultation is still needed post op.    Monica Becton, MD 02/02/2017, 5:10 PM [facility] Pager [phone] After 3 pm or if no answer, call [phone]

## 2017-02-03 DIAGNOSIS — R7881 Bacteremia: Secondary | ICD-10-CM

## 2017-02-03 DIAGNOSIS — N1339 Other hydronephrosis: Secondary | ICD-10-CM

## 2017-02-03 DIAGNOSIS — A419 Sepsis, unspecified organism: Secondary | ICD-10-CM

## 2017-02-03 LAB — BLOOD CULTURE ID PANEL (REFLEXED)

## 2017-02-03 LAB — CBC
HCT: 29.6 % — ABNORMAL LOW (ref 36.0–46.0)
Hemoglobin: 8.8 g/dL — ABNORMAL LOW (ref 12.0–15.0)
MCH: 21.5 pg — ABNORMAL LOW (ref 26.0–34.0)
MCHC: 29.7 g/dL — ABNORMAL LOW (ref 30.0–36.0)
MCV: 72.2 fL — ABNORMAL LOW (ref 78.0–100.0)
Platelets: 182 10*3/uL (ref 150–400)
RBC: 4.1 MIL/uL (ref 3.87–5.11)
RDW: 26.4 % — ABNORMAL HIGH (ref 11.5–15.5)
WBC: 14.5 10*3/uL — ABNORMAL HIGH (ref 4.0–10.5)

## 2017-02-03 LAB — COMPREHENSIVE METABOLIC PANEL
ALT: 22 U/L (ref 14–54)
AST: 30 U/L (ref 15–41)
Albumin: 2.7 g/dL — ABNORMAL LOW (ref 3.5–5.0)
Alkaline Phosphatase: 40 U/L (ref 38–126)
Anion gap: 9 (ref 5–15)
BUN: 14 mg/dL (ref 6–20)
CO2: 22 mmol/L (ref 22–32)
Calcium: 8.1 mg/dL — ABNORMAL LOW (ref 8.9–10.3)
Chloride: 110 mmol/L (ref 101–111)
Creatinine, Ser: 0.67 mg/dL (ref 0.44–1.00)
GFR calc Af Amer: >60 mL/min (ref >60)
GFR calc non Af Amer: >60 mL/min (ref >60)
Glucose, Bld: 173 mg/dL — ABNORMAL HIGH (ref 65–99)
Potassium: 3.3 mmol/L — ABNORMAL LOW (ref 3.5–5.1)
Sodium: 141 mmol/L (ref 135–145)
Total Bilirubin: 0.8 mg/dL (ref 0.3–1.2)
Total Protein: 5.5 g/dL — ABNORMAL LOW (ref 6.5–8.1)

## 2017-02-03 LAB — LACTIC ACID, PLASMA: Lactic Acid, Venous: 2.1 mmol/L (ref 0.5–1.9)

## 2017-02-03 LAB — HIV ANTIBODY (ROUTINE TESTING W REFLEX): HIV Screen 4th Generation wRfx: NONREACTIVE

## 2017-02-03 MED ORDER — BUTALBITAL-APAP-CAFFEINE 50-300-40 MG PO CAPS: 1.0000 | ORAL_CAPSULE | Freq: Two times a day (BID) | ORAL | Status: DC | PRN

## 2017-02-03 MED ORDER — ENOXAPARIN SODIUM 40 MG/0.4ML ~~LOC~~ SOLN
40.0000 mg | SUBCUTANEOUS | Status: DC
  Administered 2017-02-03 – 2017-02-05 (×3): 40 mg via SUBCUTANEOUS
  Filled 2017-02-03 (×3): qty 0.4

## 2017-02-03 MED ORDER — BUTALBITAL-APAP-CAFFEINE 50-325-40 MG PO TABS
1.0000 | ORAL_TABLET | Freq: Two times a day (BID) | ORAL | Status: DC | PRN
  Administered 2017-02-03 – 2017-02-06 (×4): 1 via ORAL
  Filled 2017-02-03 (×4): qty 1

## 2017-02-03 MED ORDER — POTASSIUM CHLORIDE CRYS ER 20 MEQ PO TBCR
40.0000 meq | EXTENDED_RELEASE_TABLET | ORAL | Status: AC
  Administered 2017-02-03 (×2): 40 meq via ORAL
  Filled 2017-02-03 (×2): qty 2

## 2017-02-03 MED ORDER — SODIUM CHLORIDE 0.9 % IV SOLN: 30.0000 meq | Freq: Once | INTRAVENOUS | Status: DC

## 2017-02-03 MED ORDER — POTASSIUM CHLORIDE 10 MEQ/100ML IV SOLN
10.0000 meq | INTRAVENOUS | Status: AC
Start: 2017-02-03 — End: 2017-02-03
  Administered 2017-02-03 (×3): 10 meq via INTRAVENOUS
  Filled 2017-02-03 (×3): qty 100

## 2017-02-03 MED ORDER — SODIUM CHLORIDE 0.9 % IV BOLUS (SEPSIS)
1000.0000 mL | Freq: Once | INTRAVENOUS | Status: AC
  Administered 2017-02-03: 1000 mL via INTRAVENOUS

## 2017-02-03 MED ORDER — PROCHLORPERAZINE EDISYLATE 5 MG/ML IJ SOLN
5.0000 mg | Freq: Four times a day (QID) | INTRAMUSCULAR | Status: DC | PRN
  Administered 2017-02-03 – 2017-02-06 (×2): 5 mg via INTRAVENOUS
  Filled 2017-02-03 (×2): qty 2

## 2017-02-03 NOTE — Progress Notes (Signed)
CRITICAL VALUE ALERT  Critical value received:  Lactic acid  Date of notification:  02/03/17  Time of notification:    Critical value read back:Yes.    Nurse who received alert:  Shermaine Brigham  MD notified (1st page):  Yes  Time of first page:    MD notified (2nd page):  Time of second page:

## 2017-02-03 NOTE — Progress Notes (Signed)
[facility] Progress Note Patient Name: Victoria Mosley DOB: [birth year] MRN: [id number]   Date of Service  02/03/2017  HPI/Events of Note  Hypokalemia  eICU Interventions  Potassium replaced     Intervention Category Intermediate Interventions: Electrolyte abnormality - evaluation and management  DETERDING,ELIZABETH 02/03/2017, 2:32 AM

## 2017-02-03 NOTE — Consult Note (Signed)
Urology Consult Note   Requesting Attending Physician:  Florencia Reasons, MD Service Providing Consult: Urology Consulting Attending: Jeffie Pollock, MD  Assessment:  Patient is a 66 y.o. female with history of nephrolithiasis, recurrent UTI, neurogenic bladder who presented 4/6 with at least 17mm right distal ureteral calculus, infected appearing urinalysis, hypotension with right perinephric stranding, collecting system enhancement suggestive of right ascending urinary tract infection, and mild hydronephrosis.  She is now s/p right ureteral stent placement on 02/02/17 with Dr. Jeffie Pollock.  Interval: In stepdown unit overnight. No pressor requirement currently but BP remains borderline. Afebrile. HR normal. 106/59 BP this morning but as low as 86/48 overnight. [place] via Foley. Cr 0.67. Lactic acid 2.1. WBC 14.5. Blood cultures positive for GNRs. Urine cultures pending. Multiple boluses required for BP support.   Recommendations: - Continue Foley catheter today - Continue CTX and follow up blood & urine cultures  - PRN flomax 0.4mg  qd stent discomfort if she can tolerate it from BP standpoint, ditropan 5mg  TID PRN bladder spasms, pyridium PRN - Definitive stone management will be deferred for at least 1-2 weeks - Recommend overall antibiotic treatment course of 79-89Q based on complicated UTI  Thank you for this consult. Please contact the  urology consult pager with any further questions/concerns. Sharmaine Base, MD Urology Surgical Resident  Subjective -  Still in right lower quadrant pain but better than yesterday.   Objective   Vital signs in last 24 hours: BP (!) 104/33   Pulse 83   Temp 98.1 F (36.7 C) (Oral)   Resp 17   Ht 5\' 7"  (1.702 m)   Wt 87.5 kg (192 lb 14.4 oz)   SpO2 93%   BMI 30.21 kg/m   Intake/Output last 3 shifts: I/O last 3 completed shifts: In: 3685 [P.O.:360; I.V.:1525; IV Piggyback:1800] Out: 81 [Urine:675]  Physical Exam General: NAD, A&O HEENT: [place]/AT, EOMI,  MMM Pulmonary: Normal work of breathing on RA Cardiovascular: Regular rate & rhythm, HDS, adequate peripheral perfusion Abdomen: soft, improved TTP in RLQ, no rebound tenderness, nondistended, no suprapubic fullness or tenderness; warm to touch GU: Foley draining orange urine Extremities: warm and well perfused, no edema  Most Recent Labs: Lab Results  Component Value Date   WBC 14.5 (H) 02/03/2017   HGB 8.8 (L) 02/03/2017   HCT 29.6 (L) 02/03/2017   PLT 182 02/03/2017    Lab Results  Component Value Date   NA 141 02/03/2017   K 3.3 (L) 02/03/2017   CL 110 02/03/2017   CO2 22 02/03/2017   BUN 14 02/03/2017   CREATININE 0.67 02/03/2017   CALCIUM 8.1 (L) 02/03/2017    Lab Results  Component Value Date   ALKPHOS 40 02/03/2017   BILITOT 0.8 02/03/2017   BILIDIR 0.1 12/29/2016   PROT 5.5 (L) 02/03/2017   ALBUMIN 2.7 (L) 02/03/2017   ALT 22 02/03/2017   AST 30 02/03/2017    Lab Results  Component Value Date   INR 1.13 02/02/2017   APTT 36 02/02/2017     Urine Culture: Pending   IMAGING: Ct Abdomen Pelvis W Contrast  Result Date: 02/02/2017 CLINICAL DATA:  Right lower quadrant abdominal pain. Upper and lower endoscopy a few days prior. EXAM: CT ABDOMEN AND PELVIS WITH CONTRAST TECHNIQUE: Multidetector CT imaging of the abdomen and pelvis was performed using the standard protocol following bolus administration of intravenous contrast. CONTRAST:  129mL ISOVUE-300 IOPAMIDOL (ISOVUE-300) INJECTION 61% COMPARISON:  08/28/2012 CT abdomen/ pelvis. FINDINGS: Lower chest: Mild bibasilar scarring versus atelectasis. Hepatobiliary:  Diffuse hepatic steatosis. Simple 1.0 cm lateral segment left liver lobe cyst. No additional liver lesions. Normal gallbladder with no radiopaque cholelithiasis. No biliary ductal dilatation. Pancreas: Normal, with no mass or duct dilation. Spleen: Normal size. No mass. Adrenals/Urinary Tract: Normal adrenals. Obstructing 7 x 5 mm stone in the distal right  pelvic ureter with mild right hydroureteronephrosis and asymmetric right perinephric fat stranding. Slight urothelial wall thickening throughout the right renal collecting system and right ureter. No left hydronephrosis. subcentimeter hypodense renal cortical lesions in both kidneys are too small to characterize and require no further follow-up. Limited bladder visualization due to streak artifact, with no gross bladder abnormality. Stomach/Bowel: Grossly normal stomach. Normal caliber small bowel with no small bowel wall thickening. Normal appendix. Mild sigmoid diverticulosis, with no evidence of large bowel wall thickening or pericolonic fat stranding. Vascular/Lymphatic: Atherosclerotic nonaneurysmal abdominal aorta. Patent portal, splenic, hepatic and renal veins. No pathologically enlarged lymph nodes in the abdomen or pelvis. Reproductive: Grossly stable enlarged myomatous uterus with dominant 4.8 cm left uterine fibroid. No adnexal mass. Other: No pneumoperitoneum, ascites or focal fluid collection. Small fat containing umbilical hernia. Musculoskeletal: No aggressive appearing focal osseous lesions. Partially visualized bilateral total hip arthroplasty. Marked degenerative disc disease at L5-S1. IMPRESSION: 1. Obstructing 7 x 5 mm distal right pelvic ureteral stone with mild right hydroureteronephrosis. Minimal urothelial wall thickening throughout the right urinary tract is probably due to the acute obstruction, although a superimposed ascending right urinary tract infection is difficult to exclude. 2. Aortic atherosclerosis . 3. Diffuse hepatic steatosis. 4. Mild sigmoid diverticulosis, with no evidence of acute diverticulitis. 5. Enlarged myomatous uterus. 6. Small fat containing umbilical hernia. Electronically Signed   By: Ilona Sorrel M.D.   On: 02/02/2017 11:24   Dg Retrograde Pyelogram  Result Date: 02/03/2017 CLINICAL DATA:  66 year old female with a history of nephrolithiasis EXAM: RETROGRADE  PYELOGRAM COMPARISON:  CT 02/02/2017 FINDINGS: Fluoroscopy: 9 minutes 48 seconds Limited intraoperative fluoroscopic spot images during retrograde pyelogram demonstrate placement of a right ureteral stent. No opacification of the collecting system or ureter. IMPRESSION: Limited images of retrograde pyelogram demonstrate placement of a right ureteral stent. Please refer to the dictated operative report for full details of intraoperative findings and procedure. Electronically Signed   By: Corrie Mckusick D.O.   On: 02/03/2017 08:51   Dg Chest Port 1 View  Result Date: 02/02/2017 CLINICAL DATA:  Sepsis and fever, known RIGHT nephrolithiasis. EXAM: PORTABLE CHEST 1 VIEW COMPARISON:  Chest radiograph November 30, 2014 FINDINGS: Elevated RIGHT hemidiaphragm. Strandy densities LEFT lung base, to lesser extent on the RIGHT. No pleural effusion. No pneumothorax. Cardiac silhouette is upper limits of normal in size, mildly calcified aortic knob. No pneumothorax. Soft tissue planes and included osseous structures are normal. IMPRESSION: Bibasilar atelectasis/scarring. Borderline cardiomegaly. Electronically Signed   By: Elon Alas M.D.   On: 02/02/2017 22:11    I have discussed her case with Dr. Laneta Simmers and Dr. Burnett Harry and reviewed her pertinent labs and films.  I have reviewed the history and examined Mrs. Moise.  She has probable infection with hypotension with the right distal stone and we will proceed with stent insertion.

## 2017-02-03 NOTE — Progress Notes (Signed)
PHARMACY - PHYSICIAN COMMUNICATION CRITICAL VALUE ALERT - BLOOD CULTURE IDENTIFICATION (BCID)  Results for orders placed or performed during the hospital encounter of 02/02/17  Blood Culture ID Panel (Reflexed) (Collected: 02/02/2017 12:07 PM)  Result Value Ref Range   Enterococcus species NOT DETECTED NOT DETECTED   Listeria monocytogenes NOT DETECTED NOT DETECTED   Staphylococcus species NOT DETECTED NOT DETECTED   Staphylococcus aureus NOT DETECTED NOT DETECTED   Streptococcus species NOT DETECTED NOT DETECTED   Streptococcus agalactiae NOT DETECTED NOT DETECTED   Streptococcus pneumoniae NOT DETECTED NOT DETECTED   Streptococcus pyogenes NOT DETECTED NOT DETECTED   Acinetobacter baumannii NOT DETECTED NOT DETECTED   Enterobacteriaceae species DETECTED (A) NOT DETECTED   Enterobacter cloacae complex NOT DETECTED NOT DETECTED   Escherichia coli DETECTED (A) NOT DETECTED   Klebsiella oxytoca NOT DETECTED NOT DETECTED   Klebsiella pneumoniae NOT DETECTED NOT DETECTED   Proteus species NOT DETECTED NOT DETECTED   Serratia marcescens NOT DETECTED NOT DETECTED   Carbapenem resistance NOT DETECTED NOT DETECTED   Haemophilus influenzae NOT DETECTED NOT DETECTED   Neisseria meningitidis NOT DETECTED NOT DETECTED   Pseudomonas aeruginosa NOT DETECTED NOT DETECTED   Candida albicans NOT DETECTED NOT DETECTED   Candida glabrata NOT DETECTED NOT DETECTED   Candida krusei NOT DETECTED NOT DETECTED   Candida parapsilosis NOT DETECTED NOT DETECTED   Candida tropicalis NOT DETECTED NOT DETECTED    Name of physician (or Provider) Contacted: Not contacted.  Changes to prescribed antibiotics required: None. Continue Rocephin 2g IV q24h, follow-up sensitivities.  Romeo Rabon, PharmD, pager [phone]. 02/03/2017,6:50 AM.

## 2017-02-03 NOTE — Progress Notes (Addendum)
PROGRESS NOTE  Lashina Milles Faria RXV:[id number] DOB: [birth year] DOA: 02/02/2017 PCP: Alysia Penna, MD  HPI/Recap of past 24 hours:  c/o migraine headache,  Right sided flank/abdominal pain is better,  bp low normal, no fever Husband at bedside  Assessment/Plan: Active Problems:   Sepsis ([place])   Sepsis presented on admission with bacteremia/UTI/obstructed stone/ hypotension continue ivf, rocephin, start stress dose steroids, hold home meds hctz/propranolol.  Urine culture/blood culture pending  uti/right pyelonephritis/ 72mm right distal ureteral calculus/right hydronephrosis,  s/p Right ureteral stent placement,  Urine culture pending, continue rocephin for now will follow urology recommendations  Hypokalemia; replace k  Iron deficiency anemia: received iv iron recently, she underwent egd and colonoscopy on Monday, she is to get capsule study in 1-2 weeks. hgb stable at baseline, She is to follow up with gi.  DVT prophylaxis: lovenox  Consultants: urology  Code Status: full   Family Communication:  Patient and husband at  bedside  Disposition Plan: remain in stepdown due to borderline blood pressure   Procedures:  s/p Right ureteral stent placement,  Antibiotics:  Rocephin    Objective: BP (!) 96/53   Pulse 72   Temp 98.1 F (36.7 C) (Oral)   Resp 20   Ht 5\' 7"  (1.702 m)   Wt 87.5 kg (192 lb 14.4 oz)   SpO2 92%   BMI 30.21 kg/m   Intake/Output Summary (Last 24 hours) at 02/03/17 1523 Last data filed at 02/03/17 1300  Gross per 24 hour  Intake             2585 ml  Output              675 ml  Net             1910 ml   Filed Weights   02/02/17 0807 02/02/17 0813 02/02/17 2100  Weight: 87.5 kg (193 lb) 87.5 kg (193 lb) 87.5 kg (192 lb 14.4 oz)    Exam:   General:  NAD  Cardiovascular: RRR  Respiratory: CTABL  Abdomen: mild right lower quadrant tenderness, no guarding, positive BS  Musculoskeletal: No Edema  Neuro: aaox3  Data  Reviewed: Basic Metabolic Panel:  Recent Labs Lab 02/02/17 0824 02/03/17 0104  NA 141 141  K 3.6 3.3*  CL 105 110  CO2 26 22  GLUCOSE 132* 173*  BUN 19 14  CREATININE 0.91 0.67  CALCIUM 9.5 8.1*   Liver Function Tests:  Recent Labs Lab 02/02/17 0824 02/03/17 0104  AST 34 30  ALT 25 22  ALKPHOS 63 40  BILITOT 0.6 0.8  PROT 7.2 5.5*  ALBUMIN 3.9 2.7*    Recent Labs Lab 02/02/17 0824  LIPASE 19   No results for input(s): AMMONIA in the last 168 hours. CBC:  Recent Labs Lab 02/02/17 0824 02/03/17 0104  WBC 9.5 14.5*  HGB 10.7* 8.8*  HCT 35.6* 29.6*  MCV 69.4* 72.2*  PLT 251 182   Cardiac Enzymes:   No results for input(s): CKTOTAL, CKMB, CKMBINDEX, TROPONINI in the last 168 hours. BNP (last 3 results) No results for input(s): BNP in the last 8760 hours.  ProBNP (last 3 results) No results for input(s): PROBNP in the last 8760 hours.  CBG: No results for input(s): GLUCAP in the last 168 hours.  Recent Results (from the past 240 hour(s))  Urine culture     Status: Abnormal (Preliminary result)   Collection Time: 02/02/17 11:15 AM  Result Value Ref Range Status   Specimen Description URINE,  RANDOM  Final   Special Requests NONE  Final   Culture >=100,000 COLONIES/mL GRAM NEGATIVE RODS (A)  Final   Report Status PENDING  Incomplete  Blood culture (routine x 2)     Status: None (Preliminary result)   Collection Time: 02/02/17 12:07 PM  Result Value Ref Range Status   Specimen Description BLOOD LEFT ANTECUBITAL  Final   Special Requests BOTTLES DRAWN AEROBIC AND ANAEROBIC BCAV  Final   Culture  Setup Time   Final    GRAM NEGATIVE RODS ANAEROBIC BOTTLE ONLY CRITICAL RESULT CALLED TO, READ BACK BY AND VERIFIED WITH: Cristopher Peru, PHARMD @[phone]  02/03/17 MKELLY,MLT Performed at [facility], [address] [place], [place] [address]    Culture GRAM NEGATIVE RODS  Final   Report Status PENDING  Incomplete  Blood Culture ID Panel (Reflexed)      Status: Abnormal   Collection Time: 02/02/17 12:07 PM  Result Value Ref Range Status   Enterococcus species NOT DETECTED NOT DETECTED Final   Listeria monocytogenes NOT DETECTED NOT DETECTED Final   Staphylococcus species NOT DETECTED NOT DETECTED Final   Staphylococcus aureus NOT DETECTED NOT DETECTED Final   Streptococcus species NOT DETECTED NOT DETECTED Final   Streptococcus agalactiae NOT DETECTED NOT DETECTED Final   Streptococcus pneumoniae NOT DETECTED NOT DETECTED Final   Streptococcus pyogenes NOT DETECTED NOT DETECTED Final   Acinetobacter baumannii NOT DETECTED NOT DETECTED Final   Enterobacteriaceae species DETECTED (A) NOT DETECTED Final    Comment: Enterobacteriaceae represent a large family of gram-negative bacteria, not a single organism. CRITICAL RESULT CALLED TO, READ BACK BY AND VERIFIED WITH: T PICKERING, PHARMD @[phone]  02/03/17 MKELLY,MLT    Enterobacter cloacae complex NOT DETECTED NOT DETECTED Final   Escherichia coli DETECTED (A) NOT DETECTED Final    Comment: CRITICAL RESULT CALLED TO, READ BACK BY AND VERIFIED WITH: T PICKERING, PHARMD @0646  02/03/17 MKELLY,MLT    Klebsiella oxytoca NOT DETECTED NOT DETECTED Final   Klebsiella pneumoniae NOT DETECTED NOT DETECTED Final   Proteus species NOT DETECTED NOT DETECTED Final   Serratia marcescens NOT DETECTED NOT DETECTED Final   Carbapenem resistance NOT DETECTED NOT DETECTED Final   Haemophilus influenzae NOT DETECTED NOT DETECTED Final   Neisseria meningitidis NOT DETECTED NOT DETECTED Final   Pseudomonas aeruginosa NOT DETECTED NOT DETECTED Final   Candida albicans NOT DETECTED NOT DETECTED Final   Candida glabrata NOT DETECTED NOT DETECTED Final   Candida krusei NOT DETECTED NOT DETECTED Final   Candida parapsilosis NOT DETECTED NOT DETECTED Final   Candida tropicalis NOT DETECTED NOT DETECTED Final    Comment: Performed at [facility], [place] [address] [place], [place] [address]  MRSA PCR Screening      Status: None   Collection Time: 02/02/17  8:19 PM  Result Value Ref Range Status   MRSA by PCR NEGATIVE NEGATIVE Final    Comment:        The GeneXpert MRSA Assay (FDA approved for NASAL specimens only), is one component of a comprehensive MRSA colonization surveillance program. It is not intended to diagnose MRSA infection nor to guide or monitor treatment for MRSA infections.      Studies: Dg Retrograde Pyelogram  Result Date: 02/03/2017 CLINICAL DATA:  66 year old female with a history of nephrolithiasis EXAM: RETROGRADE PYELOGRAM COMPARISON:  CT 02/02/2017 FINDINGS: Fluoroscopy: 9 minutes 48 seconds Limited intraoperative fluoroscopic spot images during retrograde pyelogram demonstrate placement of a right ureteral stent. No opacification of the collecting system  or ureter. IMPRESSION: Limited images of retrograde pyelogram demonstrate placement of a right ureteral stent. Please refer to the dictated operative report for full details of intraoperative findings and procedure. Electronically Signed   By: Corrie Mckusick D.O.   On: 02/03/2017 08:51   Dg Chest Port 1 View  Result Date: 02/02/2017 CLINICAL DATA:  Sepsis and fever, known RIGHT nephrolithiasis. EXAM: PORTABLE CHEST 1 VIEW COMPARISON:  Chest radiograph November 30, 2014 FINDINGS: Elevated RIGHT hemidiaphragm. Strandy densities LEFT lung base, to lesser extent on the RIGHT. No pleural effusion. No pneumothorax. Cardiac silhouette is upper limits of normal in size, mildly calcified aortic knob. No pneumothorax. Soft tissue planes and included osseous structures are normal. IMPRESSION: Bibasilar atelectasis/scarring. Borderline cardiomegaly. Electronically Signed   By: Elon Alas M.D.   On: 02/02/2017 22:11    Scheduled Meds: . cefTRIAXone (ROCEPHIN)  IV  2 g Intravenous Q24H  . diphenhydrAMINE  25 mg Oral QHS  . enoxaparin (LOVENOX) injection  40 mg Subcutaneous Q24H  . ezetimibe  10 mg Oral Daily  . hydrocortisone sod  succinate (SOLU-CORTEF) inj  50 mg Intravenous Q6H  . loratadine  10 mg Oral Daily  . medroxyPROGESTERone  2.5 mg Oral Daily  . pantoprazole  40 mg Oral Daily  . phenazopyridine  100 mg Oral TID  . polyethylene glycol  8.5 g Oral QHS  . sodium chloride flush  3 mL Intravenous Q12H    Continuous Infusions: . sodium chloride 75 mL/hr at 02/03/17 1025     Time spent: 45mins  Alanee Ting MD, PhD  [facility] Pager [phone]. If 7PM-7AM, please contact night-coverage at [email or web address], password [facility] 02/03/2017, 3:23 PM  LOS: 1 day

## 2017-02-03 NOTE — Consult Note (Addendum)
PULMONARY / CRITICAL CARE MEDICINE   Name: Victoria Mosley MRN: [id number] DOB: 07-01-1951    ADMISSION DATE:  02/02/2017 CONSULTATION DATE:  February 03, 2017  REFERRING MD:  Victoria Reasons, MD  CHIEF COMPLAINT:  Back Pain  HISTORY OF PRESENT ILLNESS:   Victoria Mosley is a 66 y/o woman admitted for infected ureteral calculus / hydro, s/p stent placement. Post-operative she was somewhat hypotensive, but mentation intact, and PCCM was called for consultation.  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  PAST SURGICAL HISTORY: She  has a past surgical history that includes Melanoma excision (2007); Foot surgery (Bilateral, 1987); Nasal septum surgery (1992); Lithotripsy (2007); Lumbar laminectomy (1993); Colonoscopy (03-28-12); Cesarean section (12/1980); Kidney stone surgery (2005); Total hip arthroplasty (Right, 2010); Posterior cervical laminectomy (07/08/2012); Hematoma evacuation (07/08/2012); Cystoscopy with retrograde pyelogram, ureteroscopy and stent placement (Left, 12/25/2012); Total knee arthroplasty (Right, 12/07/2014); LEEP (07/2015); Tubal ligation (01/1988); Back surgery (10/1991); Total hip arthroplasty (Left, 09/27/2016); and Hemorrhoid banding.  Allergies  Allergen Reactions  . Levaquin [Levofloxacin In D5w] Anaphylaxis, Rash and Other (See Comments)     Hot, dizziness, diarrhea  . Pravastatin Sodium [Pravachol]     Joint pain(severe)  . Biaxin [Clarithromycin] Nausea And Vomiting  . Ciprofloxacin Nausea Only  . Clarithromycin Nausea Only  . Dust Mite Extract Other (See Comments)      Grass, trees also- cause Runny nose, sneezing (allergy symptoms)  . Septra [Sulfamethoxazole-Trimethoprim]     diarrhea    No current facility-administered medications on file prior to encounter.    Current Outpatient Prescriptions on File Prior to Encounter  Medication Sig  . acetaminophen (TYLENOL) 500 MG tablet Take 1,000 mg by mouth every 6 (six) hours as needed for mild pain.  . Butalbital-APAP-Caffeine 50-300-40 MG CAPS Take 1 capsule by mouth 2 (two) times daily as needed (headache).  . Cranberry 500 MG TABS Take by mouth 2 (two) times daily.  . diphenhydrAMINE (BENADRYL) 25 mg capsule Take 25 mg by mouth at bedtime.  Marland Kitchen estropipate (OGEN) 0.75 MG tablet Take 1 tablet (0.75 mg total) by mouth daily.  Marland Kitchen ezetimibe (ZETIA) 10 MG tablet Take 1 tablet (10 mg total) by mouth daily.  . hydrochlorothiazide (HYDRODIURIL) 25 MG tablet Take 1 tablet (25 mg total) by mouth daily.  Marland Kitchen loratadine (CLARITIN) 10 MG tablet Take 10 mg by mouth daily.   . medroxyPROGESTERone (PROVERA) 2.5 MG tablet Take 1 tablet (2.5 mg total) by mouth daily.  . naproxen sodium (ANAPROX) 220 MG tablet Take 220 mg by mouth 4 (four) times daily as needed.  Marland Kitchen omeprazole (PRILOSEC) 20 MG capsule Take 1 capsule (20 mg total) by mouth daily.  . polyethylene glycol (MIRALAX / GLYCOLAX) packet Take 17 g by mouth daily. States takes 1/3 packet at bedtime  . potassium gluconate 595 MG TABS Take 1,190 mg by mouth every evening.   . Probiotic Product (PROBIOTIC PO) Take by mouth. Takes 5mg  once daily.  . propranolol (INDERAL) 40 MG tablet Take 1 tablet (40 mg  total) by mouth daily.  . diazepam (VALIUM) 5 MG tablet Take 1 tablet (5 mg total) by mouth every 6 (six) hours as needed for muscle spasms.  (Patient not taking: Reported on 01/29/2017)  . fluconazole (DIFLUCAN) 150 MG tablet TAKE 1 TABLET (150 MG TOTAL) BY MOUTH ONCE FOR 1 DOSE. (Patient not taking: Reported on 01/29/2017)  . hyoscyamine (LEVSIN SL) 0.125 MG SL tablet Place 1 tab on the tongue to dissolve at the at the onset of cramping, spasms then every 4 hours as needed. (Patient not taking: Reported on 02/02/2017)  . predniSONE (DELTASONE) 5 MG tablet Take 1-2 tablets (5-10 mg total) by mouth daily with breakfast. ( for mouth ulcers ) (Patient not taking: Reported on 01/29/2017)    FAMILY HISTORY:  Her indicated that the status of her mother is unknown. She indicated that the status of her father is unknown. She indicated that the status of her sister is unknown. She indicated that the status of her paternal grandmother is unknown. She indicated that the status of her neg hx is unknown.    SOCIAL HISTORY: She  reports that she quit smoking about 18 years ago. She smoked 0.25 packs per day. She has never used smokeless tobacco. She reports that she does not drink alcohol or use drugs.  REVIEW OF SYSTEMS:   Per HPI  VITAL SIGNS: BP (!) 104/55   Pulse 79   Temp 98 F (36.7 C) (Oral)   Resp 20   Ht 5\' 7"  (1.702 m)   Wt 193 lb (87.5 kg)   SpO2 93%   BMI 30.23 kg/m   HEMODYNAMICS:    VENTILATOR SETTINGS:    INTAKE / OUTPUT: I/O last 3 completed shifts: In: 2550 [I.V.:1000; IV Piggyback:1550] Out: 100 [Urine:100]  PHYSICAL EXAMINATION: General:  Middle aged woman in NAD Neuro:  Awake, alert HEENT:  MMM Cardiovascular:  Heart sounds dual and normal Lungs:  CTA Abdomen:  Soft, but tender Musculoskeletal:  ++ edema Skin:  Warm, but no rashes.  LABS:  BMET  Recent Labs Lab 02/02/17 0824  NA 141  K 3.6  CL 105  CO2 26  BUN 19  CREATININE 0.91  GLUCOSE 132*    Electrolytes  Recent Labs Lab 02/02/17 0824  CALCIUM 9.5    CBC  Recent Labs Lab 02/02/17 0824  WBC 9.5  HGB 10.7*  HCT 35.6*  PLT  251    Coag's  Recent Labs Lab 02/02/17 2214  APTT 36  INR 1.13    Sepsis Markers  Recent Labs Lab 02/02/17 2214  LATICACIDVEN 2.7*  PROCALCITON 12.59    ABG No results for input(s): PHART, PCO2ART, PO2ART in the last 168 hours.  Liver Enzymes  Recent Labs Lab 02/02/17 0824  AST 34  ALT 25  ALKPHOS 63  BILITOT 0.6  ALBUMIN 3.9    Cardiac Enzymes No results for input(s): TROPONINI, PROBNP in the last 168 hours.  Glucose No results for input(s): GLUCAP in the last 168 hours.  Imaging Ct Abdomen Pelvis W Contrast  Result Date: 02/02/2017 CLINICAL DATA:  Right lower quadrant abdominal pain. Upper and lower endoscopy a few days prior. EXAM: CT ABDOMEN AND PELVIS WITH CONTRAST TECHNIQUE: Multidetector CT imaging of the abdomen and pelvis was performed using the standard protocol following bolus administration of intravenous contrast. CONTRAST:  155mL ISOVUE-300 IOPAMIDOL (ISOVUE-300) INJECTION 61% COMPARISON:  08/28/2012 CT abdomen/ pelvis. FINDINGS: Lower chest: Mild bibasilar scarring versus atelectasis. Hepatobiliary: Diffuse hepatic steatosis. Simple 1.0 cm lateral segment left  liver lobe cyst. No additional liver lesions. Normal gallbladder with no radiopaque cholelithiasis. No biliary ductal dilatation. Pancreas: Normal, with no mass or duct dilation. Spleen: Normal size. No mass. Adrenals/Urinary Tract: Normal adrenals. Obstructing 7 x 5 mm stone in the distal right pelvic ureter with mild right hydroureteronephrosis and asymmetric right perinephric fat stranding. Slight urothelial wall thickening throughout the right renal collecting system and right ureter. No left hydronephrosis. subcentimeter hypodense renal cortical lesions in both kidneys are too small to characterize and require no further follow-up. Limited bladder visualization due to streak artifact, with no gross bladder abnormality. Stomach/Bowel: Grossly normal stomach. Normal caliber small bowel with no  small bowel wall thickening. Normal appendix. Mild sigmoid diverticulosis, with no evidence of large bowel wall thickening or pericolonic fat stranding. Vascular/Lymphatic: Atherosclerotic nonaneurysmal abdominal aorta. Patent portal, splenic, hepatic and renal veins. No pathologically enlarged lymph nodes in the abdomen or pelvis. Reproductive: Grossly stable enlarged myomatous uterus with dominant 4.8 cm left uterine fibroid. No adnexal mass. Other: No pneumoperitoneum, ascites or focal fluid collection. Small fat containing umbilical hernia. Musculoskeletal: No aggressive appearing focal osseous lesions. Partially visualized bilateral total hip arthroplasty. Marked degenerative disc disease at L5-S1. IMPRESSION: 1. Obstructing 7 x 5 mm distal right pelvic ureteral stone with mild right hydroureteronephrosis. Minimal urothelial wall thickening throughout the right urinary tract is probably due to the acute obstruction, although a superimposed ascending right urinary tract infection is difficult to exclude. 2. Aortic atherosclerosis . 3. Diffuse hepatic steatosis. 4. Mild sigmoid diverticulosis, with no evidence of acute diverticulitis. 5. Enlarged myomatous uterus. 6. Small fat containing umbilical hernia. Electronically Signed   By: Ilona Sorrel M.D.   On: 02/02/2017 11:24   Dg Chest Port 1 View  Result Date: 02/02/2017 CLINICAL DATA:  Sepsis and fever, known RIGHT nephrolithiasis. EXAM: PORTABLE CHEST 1 VIEW COMPARISON:  Chest radiograph November 30, 2014 FINDINGS: Elevated RIGHT hemidiaphragm. Strandy densities LEFT lung base, to lesser extent on the RIGHT. No pleural effusion. No pneumothorax. Cardiac silhouette is upper limits of normal in size, mildly calcified aortic knob. No pneumothorax. Soft tissue planes and included osseous structures are normal. IMPRESSION: Bibasilar atelectasis/scarring. Borderline cardiomegaly. Electronically Signed   By: Elon Alas M.D.   On: 02/02/2017 22:11      ASSESSMENT / PLAN:  1. Septic Shock from pyelonephritis: Responding reasonably well to IVF at present, natural history will likely be hypotensive for 8-12 hours, may require low dose pressors, if necessary, we will dose with peripheral levo. eLink to follow o/n.   CRITICAL CARE Performed by: Luz Brazen   Total critical care time: 35 minutes  Critical care time was exclusive of separately billable procedures and treating other patients.  Critical care was necessary to treat or prevent imminent or life-threatening deterioration.  Critical care was time spent personally by me on the following activities: development of treatment plan with patient and/or surrogate as well as nursing, discussions with consultants, evaluation of patient's response to treatment, examination of patient, obtaining history from patient or surrogate, ordering and performing treatments and interventions, ordering and review of laboratory studies, ordering and review of radiographic studies, pulse oximetry and re-evaluation of patient's condition.   Luz Brazen, MD Pulmonary and [place] Pager: [phone]  02/03/2017, 1:32 AM

## 2017-02-04 DIAGNOSIS — E876 Hypokalemia: Secondary | ICD-10-CM

## 2017-02-04 LAB — BASIC METABOLIC PANEL
Anion gap: 4 — ABNORMAL LOW (ref 5–15)
BUN: 18 mg/dL (ref 6–20)
CO2: 25 mmol/L (ref 22–32)
Calcium: 7.9 mg/dL — ABNORMAL LOW (ref 8.9–10.3)
Chloride: 113 mmol/L — ABNORMAL HIGH (ref 101–111)
Creatinine, Ser: 0.61 mg/dL (ref 0.44–1.00)
GFR calc Af Amer: >60 mL/min (ref >60)
GFR calc non Af Amer: >60 mL/min (ref >60)
Glucose, Bld: 136 mg/dL — ABNORMAL HIGH (ref 65–99)
Potassium: 3.5 mmol/L (ref 3.5–5.1)
Sodium: 142 mmol/L (ref 135–145)

## 2017-02-04 LAB — URINE CULTURE: Culture: >=100000 — AB

## 2017-02-04 LAB — CBC
HCT: 27.8 % — ABNORMAL LOW (ref 36.0–46.0)
Hemoglobin: 8.1 g/dL — ABNORMAL LOW (ref 12.0–15.0)
MCH: 20.8 pg — ABNORMAL LOW (ref 26.0–34.0)
MCHC: 29.1 g/dL — ABNORMAL LOW (ref 30.0–36.0)
MCV: 71.3 fL — ABNORMAL LOW (ref 78.0–100.0)
Platelets: 165 10*3/uL (ref 150–400)
RBC: 3.9 MIL/uL (ref 3.87–5.11)
RDW: 27.6 % — ABNORMAL HIGH (ref 11.5–15.5)
WBC: 12.5 10*3/uL — ABNORMAL HIGH (ref 4.0–10.5)

## 2017-02-04 LAB — MAGNESIUM: Magnesium: 1.6 mg/dL — ABNORMAL LOW (ref 1.7–2.4)

## 2017-02-04 LAB — LACTIC ACID, PLASMA: Lactic Acid, Venous: 1.4 mmol/L (ref 0.5–1.9)

## 2017-02-04 MED ORDER — ESTROPIPATE 0.75 MG PO TABS
0.7500 mg | ORAL_TABLET | Freq: Every day | ORAL | Status: DC
  Administered 2017-02-04 – 2017-02-06 (×3): 0.75 mg via ORAL
  Filled 2017-02-04 (×3): qty 1

## 2017-02-04 MED ORDER — PHENOL 1.4 % MT LIQD
1.0000 | OROMUCOSAL | Status: DC | PRN
  Administered 2017-02-04: 1 via OROMUCOSAL
  Filled 2017-02-04: qty 177

## 2017-02-04 MED ORDER — MAGNESIUM SULFATE 2 GM/50ML IV SOLN
2.0000 g | Freq: Once | INTRAVENOUS | Status: AC
  Administered 2017-02-04: 2 g via INTRAVENOUS
  Filled 2017-02-04: qty 50

## 2017-02-04 MED ORDER — POTASSIUM CHLORIDE CRYS ER 20 MEQ PO TBCR
40.0000 meq | EXTENDED_RELEASE_TABLET | ORAL | Status: AC
  Administered 2017-02-04 (×2): 40 meq via ORAL
  Filled 2017-02-04 (×2): qty 2

## 2017-02-04 NOTE — Progress Notes (Signed)
Spoke with MD okay for patient to shower. Husband or staff must be present with patient. May come off tele for shower. Vital signs reviewed with MD. No change in initial AM assessment at this time. Continue with current plan of care

## 2017-02-04 NOTE — Progress Notes (Signed)
Nutrition Brief Note  Patient identified on the Malnutrition Screening Tool (MST) Report  Patient states her appetite is improving. States she ate a blueberry muffin, eggs and potatoes this morning. She is awaiting lunch. Pt states her poor appetite is associated with constipation and medication side effects. Ordered hot tea for patient per patient request.  Wt Readings from Last 15 Encounters:  02/02/17 192 lb 14.4 oz (87.5 kg)  02/01/17 193 lb (87.5 kg)  01/29/17 195 lb (88.5 kg)  01/15/17 195 lb (88.5 kg)  01/04/17 193 lb (87.5 kg)  11/21/16 198 lb (89.8 kg)  10/25/16 202 lb 8 oz (91.9 kg)  10/12/16 203 lb (92.1 kg)  09/27/16 208 lb (94.3 kg)  09/18/16 208 lb 12.8 oz (94.7 kg)  09/06/16 208 lb (94.3 kg)  08/09/16 205 lb (93 kg)  04/06/16 198 lb (89.8 kg)  02/28/16 197 lb (89.4 kg)  02/02/16 200 lb (90.7 kg)    Body mass index is 30.21 kg/m. Patient meets criteria for obesity based on current BMI.   Current diet order is soft, patient is consuming approximately 50% of meals at this time. Labs and medications reviewed.   No nutrition interventions warranted at this time. If nutrition issues arise, please consult RD.   Clayton Bibles, MS, RD, LDN Pager: [phone] After Hours Pager: [phone]

## 2017-02-04 NOTE — Consult Note (Signed)
Urology Consult Note   Requesting Attending Physician:  Florencia Reasons, MD Service Providing Consult: Urology Consulting Attending: Jeffie Pollock, MD  Assessment:  Patient is a 66 y.o. female with history of nephrolithiasis, recurrent UTI, neurogenic bladder who presented 4/6 with at least 67mm right distal ureteral calculus, infected appearing urinalysis, hypotension with right perinephric stranding, collecting system enhancement suggestive of right ascending urinary tract infection, and mild hydronephrosis.  She is now s/p right ureteral stent placement on 02/02/17 with Dr. Jeffie Pollock.  Interval: Afebrile. Normal HR. In stepdown unit. Continues to have low BP but no pressor requirement, 103/53 this morning. 1.2L UOP via Foley. Cr 0.6. WBC 12. Lactate 1.4 from 2.1. Urine culture from ER growing E coli resistant to cipro, nitrofurantoin, Amp. Urine culture from OR 100k GNRs. Blood cultures show GNRs in 1/2. On CTX.   Recommendations: - Discontinue Foley today, TOV - Agree with CTX and follow up blood & urine cultures. Consider transition to PO Augmentin or Keflex when felt to be appropriate from medical standpoint  - PRN flomax 0.4mg  qd stent discomfort if she can tolerate it from BP standpoint, ditropan 5mg  TID PRN bladder spasms - Definitive stone management will be deferred for at least 1-2 weeks and will be scheduled by [facility] with Dr. Louis Meckel - Recommend overall antibiotic treatment course of 08M based on complicated UTI (until time of definitive stone surgery)  Thank you for this consult. Please contact the urology consult pager with any further questions/concerns. We will sign off. Sharmaine Base, MD Urology Surgical Resident  Subjective -  Feels better, ready to take a shower. Encouraged that her BP is improving somewhat.   Objective   Vital signs in last 24 hours: BP (!) 105/50   Pulse 80   Temp 97.9 F (36.6 C) (Oral)   Resp (!) 23   Ht 5\' 7"  (1.702 m)   Wt 87.5 kg (192 lb 14.4 oz)    SpO2 92%   BMI 30.21 kg/m   Intake/Output last 3 shifts: I/O last 3 completed shifts: In: 2341.3 [P.O.:360; I.V.:1681.3; IV Piggyback:300] Out: 1800 [Urine:1800]  Physical Exam General: NAD, A&O HEENT: [place]/AT, EOMI, MMM Pulmonary: Normal work of breathing on RA Cardiovascular: Regular rate & rhythm, HDS, adequate peripheral perfusion Abdomen: soft, improved TTP in RLQ, no rebound tenderness, nondistended, no suprapubic fullness or tenderness GU: Foley draining orange urine Extremities: warm and well perfused, no edema  Most Recent Labs: Lab Results  Component Value Date   WBC 12.5 (H) 02/04/2017   HGB 8.1 (L) 02/04/2017   HCT 27.8 (L) 02/04/2017   PLT 165 02/04/2017    Lab Results  Component Value Date   NA 142 02/04/2017   K 3.5 02/04/2017   CL 113 (H) 02/04/2017   CO2 25 02/04/2017   BUN 18 02/04/2017   CREATININE 0.61 02/04/2017   CALCIUM 7.9 (L) 02/04/2017   MG 1.6 (L) 02/04/2017    Lab Results  Component Value Date   ALKPHOS 40 02/03/2017   BILITOT 0.8 02/03/2017   BILIDIR 0.1 12/29/2016   PROT 5.5 (L) 02/03/2017   ALBUMIN 2.7 (L) 02/03/2017   ALT 22 02/03/2017   AST 30 02/03/2017    Lab Results  Component Value Date   INR 1.13 02/02/2017   APTT 36 02/02/2017     Urine Culture:  100K CFU/mL E coli Specimen Description URINE, RANDOM   Special Requests NONE   Culture >=100,000 COLONIES/mL ESCHERICHIA COLI    Report Status 02/04/2017 FINAL   Organism ID, Bacteria  ESCHERICHIA COLI    Resulting Agency [facility]  Susceptibility    Escherichia coli    MIC    AMPICILLIN 16 INTERMED... Intermediate    AMPICILLIN/SULBACTAM 4 SENSITIVE  Sensitive    CEFAZOLIN <=4 SENSITIVE "><=4 SENSITIVE  Sensitive    CEFTRIAXONE <=1 SENSITIVE "><=1 SENSITIVE  Sensitive    CIPROFLOXACIN 2 INTERMEDI... Intermediate    Extended ESBL NEGATIVE  Sensitive    GENTAMICIN <=1 SENSITIVE "><=1 SENSITIVE  Sensitive    IMIPENEM <=0.25 SENSITIVE "><=0.25 SENS... Sensitive      NITROFURANTOIN 128 RESISTANT  Resistant    PIP/TAZO <=4 SENSITIVE "><=4 SENSITIVE  Sensitive    TRIMETH/SULFA <=20 SENSITIVE "><=20 SENSIT... Sensitive         IMAGING: Ct Abdomen Pelvis W Contrast  Result Date: 02/02/2017 CLINICAL DATA:  Right lower quadrant abdominal pain. Upper and lower endoscopy a few days prior. EXAM: CT ABDOMEN AND PELVIS WITH CONTRAST TECHNIQUE: Multidetector CT imaging of the abdomen and pelvis was performed using the standard protocol following bolus administration of intravenous contrast. CONTRAST:  169mL ISOVUE-300 IOPAMIDOL (ISOVUE-300) INJECTION 61% COMPARISON:  08/28/2012 CT abdomen/ pelvis. FINDINGS: Lower chest: Mild bibasilar scarring versus atelectasis. Hepatobiliary: Diffuse hepatic steatosis. Simple 1.0 cm lateral segment left liver lobe cyst. No additional liver lesions. Normal gallbladder with no radiopaque cholelithiasis. No biliary ductal dilatation. Pancreas: Normal, with no mass or duct dilation. Spleen: Normal size. No mass. Adrenals/Urinary Tract: Normal adrenals. Obstructing 7 x 5 mm stone in the distal right pelvic ureter with mild right hydroureteronephrosis and asymmetric right perinephric fat stranding. Slight urothelial wall thickening throughout the right renal collecting system and right ureter. No left hydronephrosis. subcentimeter hypodense renal cortical lesions in both kidneys are too small to characterize and require no further follow-up. Limited bladder visualization due to streak artifact, with no gross bladder abnormality. Stomach/Bowel: Grossly normal stomach. Normal caliber small bowel with no small bowel wall thickening. Normal appendix. Mild sigmoid diverticulosis, with no evidence of large bowel wall thickening or pericolonic fat stranding. Vascular/Lymphatic: Atherosclerotic nonaneurysmal abdominal aorta. Patent portal, splenic, hepatic and renal veins. No pathologically enlarged lymph nodes in the abdomen or pelvis. Reproductive:  Grossly stable enlarged myomatous uterus with dominant 4.8 cm left uterine fibroid. No adnexal mass. Other: No pneumoperitoneum, ascites or focal fluid collection. Small fat containing umbilical hernia. Musculoskeletal: No aggressive appearing focal osseous lesions. Partially visualized bilateral total hip arthroplasty. Marked degenerative disc disease at L5-S1. IMPRESSION: 1. Obstructing 7 x 5 mm distal right pelvic ureteral stone with mild right hydroureteronephrosis. Minimal urothelial wall thickening throughout the right urinary tract is probably due to the acute obstruction, although a superimposed ascending right urinary tract infection is difficult to exclude. 2. Aortic atherosclerosis . 3. Diffuse hepatic steatosis. 4. Mild sigmoid diverticulosis, with no evidence of acute diverticulitis. 5. Enlarged myomatous uterus. 6. Small fat containing umbilical hernia. Electronically Signed   By: Ilona Sorrel M.D.   On: 02/02/2017 11:24   Dg Retrograde Pyelogram  Result Date: 02/03/2017 CLINICAL DATA:  66 year old female with a history of nephrolithiasis EXAM: RETROGRADE PYELOGRAM COMPARISON:  CT 02/02/2017 FINDINGS: Fluoroscopy: 9 minutes 48 seconds Limited intraoperative fluoroscopic spot images during retrograde pyelogram demonstrate placement of a right ureteral stent. No opacification of the collecting system or ureter. IMPRESSION: Limited images of retrograde pyelogram demonstrate placement of a right ureteral stent. Please refer to the dictated operative report for full details of intraoperative findings and procedure. Electronically Signed   By: Corrie Mckusick D.O.   On: 02/03/2017 08:51  Dg Chest Port 1 View  Result Date: 02/02/2017 CLINICAL DATA:  Sepsis and fever, known RIGHT nephrolithiasis. EXAM: PORTABLE CHEST 1 VIEW COMPARISON:  Chest radiograph November 30, 2014 FINDINGS: Elevated RIGHT hemidiaphragm. Strandy densities LEFT lung base, to lesser extent on the RIGHT. No pleural effusion. No  pneumothorax. Cardiac silhouette is upper limits of normal in size, mildly calcified aortic knob. No pneumothorax. Soft tissue planes and included osseous structures are normal. IMPRESSION: Bibasilar atelectasis/scarring. Borderline cardiomegaly. Electronically Signed   By: Elon Alas M.D.   On: 02/02/2017 22:11

## 2017-02-04 NOTE — Progress Notes (Signed)
PROGRESS NOTE  Victoria Mosley HFW:[id number] DOB: 1951-04-12 DOA: 02/02/2017 PCP: Alysia Penna, MD  HPI/Recap of past 24 hours:  Feeling better Right sided flank/abdominal pain has almost resolved,   bp improving, no fever   Assessment/Plan: Active Problems:   Sepsis ([place])   Sepsis presented on admission with ecoli bacteremia/ecoli UTI/obstructed stone/ hypotension Rocephin started since admission, on stress dose steroids, she received ivf, currently off ivf, hold home meds hctz/propranolol.  Urine culture/blood culture +ecoli, sensitive to rocephin, continue rocephin, repeat blood culture  uti/right pyelonephritis/ 7mm right distal ureteral calculus/right hydronephrosis,  s/p Right ureteral stent placement,  continue rocephin for now will follow urology recommendations  Hypokalemia/hypomagnesemia; replace k/mag  Iron deficiency anemia:  received iv iron recently, she underwent egd and colonoscopy on Monday, she is to get capsule study in 1-2 weeks.  She is to follow up with gi. hgb 10.7-8.8-8.1, continue monitor  DVT prophylaxis: lovenox  Consultants: urology  Code Status: full   Family Communication:  Patient and husband at  bedside  Disposition Plan: transfer to med tele   Procedures:  s/p Right ureteral stent placement,  Antibiotics:  Rocephin    Objective: BP (!) 103/53   Pulse 71   Temp 98.4 F (36.9 C) (Oral)   Resp (!) 23   Ht 5\' 7"  (1.702 m)   Wt 87.5 kg (192 lb 14.4 oz)   SpO2 97%   BMI 30.21 kg/m   Intake/Output Summary (Last 24 hours) at 02/04/17 0814 Last data filed at 02/04/17 0600  Gross per 24 hour  Intake          1131.25 ml  Output             1225 ml  Net           -93.75 ml   Filed Weights   02/02/17 0807 02/02/17 0813 02/02/17 2100  Weight: 87.5 kg (193 lb) 87.5 kg (193 lb) 87.5 kg (192 lb 14.4 oz)    Exam:   General:  NAD  Cardiovascular: RRR  Respiratory: CTABL  Abdomen: mild right lower quadrant  tenderness, no guarding, positive BS  Musculoskeletal: No Edema  Neuro: aaox3  Data Reviewed: Basic Metabolic Panel:  Recent Labs Lab 02/02/17 0824 02/03/17 0104 02/04/17 0359  NA 141 141 142  K 3.6 3.3* 3.5  CL 105 110 113*  CO2 26 22 25   GLUCOSE 132* 173* 136*  BUN 19 14 18   CREATININE 0.91 0.67 0.61  CALCIUM 9.5 8.1* 7.9*  MG  --   --  1.6*   Liver Function Tests:  Recent Labs Lab 02/02/17 0824 02/03/17 0104  AST 34 30  ALT 25 22  ALKPHOS 63 40  BILITOT 0.6 0.8  PROT 7.2 5.5*  ALBUMIN 3.9 2.7*    Recent Labs Lab 02/02/17 0824  LIPASE 19   No results for input(s): AMMONIA in the last 168 hours. CBC:  Recent Labs Lab 02/02/17 0824 02/03/17 0104 02/04/17 0359  WBC 9.5 14.5* 12.5*  HGB 10.7* 8.8* 8.1*  HCT 35.6* 29.6* 27.8*  MCV 69.4* 72.2* 71.3*  PLT 251 182 165   Cardiac Enzymes:   No results for input(s): CKTOTAL, CKMB, CKMBINDEX, TROPONINI in the last 168 hours. BNP (last 3 results) No results for input(s): BNP in the last 8760 hours.  ProBNP (last 3 results) No results for input(s): PROBNP in the last 8760 hours.  CBG: No results for input(s): GLUCAP in the last 168 hours.  Recent Results (from the past 240  hour(s))  Urine culture     Status: Abnormal   Collection Time: 02/02/17 11:15 AM  Result Value Ref Range Status   Specimen Description URINE, RANDOM  Final   Special Requests NONE  Final   Culture >=100,000 COLONIES/mL ESCHERICHIA COLI (A)  Final   Report Status 02/04/2017 FINAL  Final   Organism ID, Bacteria ESCHERICHIA COLI (A)  Final      Susceptibility   Escherichia coli - MIC*    AMPICILLIN 16 INTERMEDIATE Intermediate     CEFAZOLIN <=4 SENSITIVE Sensitive     CEFTRIAXONE <=1 SENSITIVE Sensitive     CIPROFLOXACIN 2 INTERMEDIATE Intermediate     GENTAMICIN <=1 SENSITIVE Sensitive     IMIPENEM <=0.25 SENSITIVE Sensitive     NITROFURANTOIN 128 RESISTANT Resistant     TRIMETH/SULFA <=20 SENSITIVE Sensitive      AMPICILLIN/SULBACTAM 4 SENSITIVE Sensitive     PIP/TAZO <=4 SENSITIVE Sensitive     Extended ESBL NEGATIVE Sensitive     * >=100,000 COLONIES/mL ESCHERICHIA COLI  Blood culture (routine x 2)     Status: None (Preliminary result)   Collection Time: 02/02/17 12:07 PM  Result Value Ref Range Status   Specimen Description BLOOD LEFT ANTECUBITAL  Final   Special Requests BOTTLES DRAWN AEROBIC AND ANAEROBIC BCAV  Final   Culture  Setup Time   Final    GRAM NEGATIVE RODS ANAEROBIC BOTTLE ONLY CRITICAL RESULT CALLED TO, READ BACK BY AND VERIFIED WITH: T PICKERING, PHARMD @[phone]  02/03/17 MKELLY,MLT Performed at [facility], [address] [place], [place] [address]    Culture GRAM NEGATIVE RODS  Final   Report Status PENDING  Incomplete  Blood Culture ID Panel (Reflexed)     Status: Abnormal   Collection Time: 02/02/17 12:07 PM  Result Value Ref Range Status   Enterococcus species NOT DETECTED NOT DETECTED Final   Listeria monocytogenes NOT DETECTED NOT DETECTED Final   Staphylococcus species NOT DETECTED NOT DETECTED Final   Staphylococcus aureus NOT DETECTED NOT DETECTED Final   Streptococcus species NOT DETECTED NOT DETECTED Final   Streptococcus agalactiae NOT DETECTED NOT DETECTED Final   Streptococcus pneumoniae NOT DETECTED NOT DETECTED Final   Streptococcus pyogenes NOT DETECTED NOT DETECTED Final   Acinetobacter baumannii NOT DETECTED NOT DETECTED Final   Enterobacteriaceae species DETECTED (A) NOT DETECTED Final    Comment: Enterobacteriaceae represent a large family of gram-negative bacteria, not a single organism. CRITICAL RESULT CALLED TO, READ BACK BY AND VERIFIED WITH: T PICKERING, PHARMD @0646  02/03/17 MKELLY,MLT    Enterobacter cloacae complex NOT DETECTED NOT DETECTED Final   Escherichia coli DETECTED (A) NOT DETECTED Final    Comment: CRITICAL RESULT CALLED TO, READ BACK BY AND VERIFIED WITH: T PICKERING, PHARMD @0646  02/03/17 MKELLY,MLT    Klebsiella oxytoca  NOT DETECTED NOT DETECTED Final   Klebsiella pneumoniae NOT DETECTED NOT DETECTED Final   Proteus species NOT DETECTED NOT DETECTED Final   Serratia marcescens NOT DETECTED NOT DETECTED Final   Carbapenem resistance NOT DETECTED NOT DETECTED Final   Haemophilus influenzae NOT DETECTED NOT DETECTED Final   Neisseria meningitidis NOT DETECTED NOT DETECTED Final   Pseudomonas aeruginosa NOT DETECTED NOT DETECTED Final   Candida albicans NOT DETECTED NOT DETECTED Final   Candida glabrata NOT DETECTED NOT DETECTED Final   Candida krusei NOT DETECTED NOT DETECTED Final   Candida parapsilosis NOT DETECTED NOT DETECTED Final   Candida tropicalis NOT DETECTED NOT DETECTED Final    Comment: Performed at [facility], [place] [address] [place], [place] [address]  Blood culture (routine x 2)     Status: None (Preliminary result)   Collection Time: 02/02/17 12:15 PM  Result Value Ref Range Status   Specimen Description BLOOD LEFT HAND  Final   Special Requests IN PEDIATRIC BOTTLE BCLV  Final   Culture   Final    NO GROWTH < 24 HOURS Performed at [facility], [place] [address], [place], [place] [address]    Report Status PENDING  Incomplete  Urine culture     Status: Abnormal (Preliminary result)   Collection Time: 02/02/17  5:36 PM  Result Value Ref Range Status   Specimen Description URINE, CATHETERIZED R RENAL PELVIS  Final   Special Requests NONE  Final   Culture >=100,000 COLONIES/mL GRAM NEGATIVE RODS (A)  Final   Report Status PENDING  Incomplete  MRSA PCR Screening     Status: None   Collection Time: 02/02/17  8:19 PM  Result Value Ref Range Status   MRSA by PCR NEGATIVE NEGATIVE Final    Comment:        The GeneXpert MRSA Assay (FDA approved for NASAL specimens only), is one component of a comprehensive MRSA colonization surveillance program. It is not intended to diagnose MRSA infection nor to guide or monitor treatment for MRSA infections.      Studies: No  results found.  Scheduled Meds: . cefTRIAXone (ROCEPHIN)  IV  2 g Intravenous Q24H  . diphenhydrAMINE  25 mg Oral QHS  . enoxaparin (LOVENOX) injection  40 mg Subcutaneous Q24H  . ezetimibe  10 mg Oral Daily  . hydrocortisone sod succinate (SOLU-CORTEF) inj  50 mg Intravenous Q6H  . loratadine  10 mg Oral Daily  . magnesium sulfate 1 - 4 g bolus IVPB  2 g Intravenous Once  . medroxyPROGESTERone  2.5 mg Oral Daily  . pantoprazole  40 mg Oral Daily  . phenazopyridine  100 mg Oral TID  . polyethylene glycol  8.5 g Oral QHS  . potassium chloride  40 mEq Oral Q4H  . sodium chloride flush  3 mL Intravenous Q12H    Continuous Infusions:    Time spent: 91mins  Jayme Cham MD, PhD  [facility] Pager [phone]. If 7PM-7AM, please contact night-coverage at [email or web address], password [facility] 02/04/2017, 8:14 AM  LOS: 2 days

## 2017-02-05 ENCOUNTER — Telehealth: Payer: Self-pay

## 2017-02-05 ENCOUNTER — Encounter (HOSPITAL_COMMUNITY): Payer: Self-pay | Admitting: Urology

## 2017-02-05 ENCOUNTER — Telehealth: Payer: Self-pay | Admitting: Internal Medicine

## 2017-02-05 ENCOUNTER — Other Ambulatory Visit: Payer: Self-pay | Admitting: Urology

## 2017-02-05 DIAGNOSIS — A4151 Sepsis due to Escherichia coli [E. coli]: Principal | ICD-10-CM

## 2017-02-05 DIAGNOSIS — N132 Hydronephrosis with renal and ureteral calculous obstruction: Secondary | ICD-10-CM

## 2017-02-05 LAB — CBC
HCT: 28.2 % — ABNORMAL LOW (ref 36.0–46.0)
Hemoglobin: 8.3 g/dL — ABNORMAL LOW (ref 12.0–15.0)
MCH: 20.9 pg — ABNORMAL LOW (ref 26.0–34.0)
MCHC: 29.4 g/dL — ABNORMAL LOW (ref 30.0–36.0)
MCV: 71 fL — ABNORMAL LOW (ref 78.0–100.0)
Platelets: 188 10*3/uL (ref 150–400)
RBC: 3.97 MIL/uL (ref 3.87–5.11)
RDW: 28.5 % — ABNORMAL HIGH (ref 11.5–15.5)
WBC: 10.4 10*3/uL (ref 4.0–10.5)

## 2017-02-05 LAB — BASIC METABOLIC PANEL
Anion gap: 6 (ref 5–15)
BUN: 19 mg/dL (ref 6–20)
CO2: 23 mmol/L (ref 22–32)
Calcium: 8.2 mg/dL — ABNORMAL LOW (ref 8.9–10.3)
Chloride: 113 mmol/L — ABNORMAL HIGH (ref 101–111)
Creatinine, Ser: 0.55 mg/dL (ref 0.44–1.00)
GFR calc Af Amer: >60 mL/min (ref >60)
GFR calc non Af Amer: >60 mL/min (ref >60)
Glucose, Bld: 137 mg/dL — ABNORMAL HIGH (ref 65–99)
Potassium: 3.9 mmol/L (ref 3.5–5.1)
Sodium: 142 mmol/L (ref 135–145)

## 2017-02-05 LAB — CULTURE, BLOOD (ROUTINE X 2)

## 2017-02-05 LAB — URINE CULTURE: Culture: >=100000 — AB

## 2017-02-05 MED ORDER — SENNOSIDES-DOCUSATE SODIUM 8.6-50 MG PO TABS
1.0000 | ORAL_TABLET | Freq: Two times a day (BID) | ORAL | Status: DC
  Administered 2017-02-05: 1 via ORAL
  Filled 2017-02-05 (×2): qty 1

## 2017-02-05 MED ORDER — HYDROCORTISONE NA SUCCINATE PF 100 MG IJ SOLR
50.0000 mg | Freq: Two times a day (BID) | INTRAMUSCULAR | Status: DC
  Administered 2017-02-05 – 2017-02-06 (×2): 50 mg via INTRAVENOUS
  Filled 2017-02-05 (×2): qty 2

## 2017-02-05 NOTE — Telephone Encounter (Signed)
Spoke with pts husband and he is aware. Appt cancelled and he knows to have pt contact us when she feels up to rescheduling capsule endo.

## 2017-02-05 NOTE — Telephone Encounter (Signed)
Left message for Kennyth Lose that Dr. Henrene Pastor is fine with Dr. Rosendo Gros seeing the pt.

## 2017-02-05 NOTE — Telephone Encounter (Signed)
Pt is scheduled for capsule endo 4/16. Husband states pt was admitted to the hospital on Friday with sepsis. Pt has kidney stone with blockage. Had stent placed and will have to have kidney stone removed when better. Husband wants to know if pt should postpone the capsule endo. States she may get to go home tomorrow. States her Hgb dropped to 8.1 yesterday and today it is 8.3.  Husband also wants to know if Dr. Henrene Pastor thinks the iron infusions did any good since the doctors think she may have been septic when she received one of the infusions on Thursday. Please advise.

## 2017-02-05 NOTE — Progress Notes (Signed)
PROGRESS NOTE  Victoria Mosley NTI:[id number] DOB: 10-16-1951 DOA: 02/02/2017 PCP: Alysia Penna, MD  HPI/Recap of past 24 hours:  Feeling better Report some headache, denies abdominal pain, no n/v. bp improving, no fever   Assessment/Plan: Active Problems:   Sepsis ([place])   Sepsis presented on admission with ecoli bacteremia/ecoli UTI/obstructed stone/ hypotension Rocephin started since admission, on stress dose steroids, she received ivf, currently off ivf, hold home meds hctz/propranolol.  Urine culture/blood culture +ecoli, sensitive to rocephin, continue rocephin, repeat blood culture to ensure clearance of bacteremia  uti/right pyelonephritis/ 43mm right distal ureteral calculus/right hydronephrosis,  s/p Right ureteral stent placement,  continue rocephin for now will follow urology recommendations  Hypokalemia/hypomagnesemia; replace k/mag  Iron deficiency anemia:  received iv iron recently, she underwent egd and colonoscopy on Monday, she is to get capsule study in 1-2 weeks.  She is to follow up with gi. hgb 10.7-8.8-8.1, continue monitor  DVT prophylaxis: lovenox  Consultants: urology  Code Status: full   Family Communication:  Patient and husband at  bedside  Disposition Plan: likely home on 4/10 with oral abx   Procedures:  s/p Right ureteral stent placement,  Antibiotics:  Rocephin    Objective: BP (!) 117/49 (BP Location: Left Arm)   Pulse 85   Temp 98.5 F (36.9 C) (Oral)   Resp 16   Ht 5\' 7"  (1.702 m)   Wt 87.5 kg (192 lb 14.4 oz)   SpO2 99%   BMI 30.21 kg/m   Intake/Output Summary (Last 24 hours) at 02/05/17 1724 Last data filed at 02/05/17 1125  Gross per 24 hour  Intake              290 ml  Output              225 ml  Net               65 ml   Filed Weights   02/02/17 0807 02/02/17 0813 02/02/17 2100  Weight: 87.5 kg (193 lb) 87.5 kg (193 lb) 87.5 kg (192 lb 14.4 oz)    Exam:   General:  NAD  Cardiovascular:  RRR  Respiratory: CTABL  Abdomen: right sided tenderness has resolved, no guarding, positive BS  Musculoskeletal: No Edema  Neuro: aaox3  Data Reviewed: Basic Metabolic Panel:  Recent Labs Lab 02/02/17 0824 02/03/17 0104 02/04/17 0359 02/05/17 0518  NA 141 141 142 142  K 3.6 3.3* 3.5 3.9  CL 105 110 113* 113*  CO2 26 22 25 23   GLUCOSE 132* 173* 136* 137*  BUN 19 14 18 19   CREATININE 0.91 0.67 0.61 0.55  CALCIUM 9.5 8.1* 7.9* 8.2*  MG  --   --  1.6*  --    Liver Function Tests:  Recent Labs Lab 02/02/17 0824 02/03/17 0104  AST 34 30  ALT 25 22  ALKPHOS 63 40  BILITOT 0.6 0.8  PROT 7.2 5.5*  ALBUMIN 3.9 2.7*    Recent Labs Lab 02/02/17 0824  LIPASE 19   No results for input(s): AMMONIA in the last 168 hours. CBC:  Recent Labs Lab 02/02/17 0824 02/03/17 0104 02/04/17 0359 02/05/17 0518  WBC 9.5 14.5* 12.5* 10.4  HGB 10.7* 8.8* 8.1* 8.3*  HCT 35.6* 29.6* 27.8* 28.2*  MCV 69.4* 72.2* 71.3* 71.0*  PLT 251 182 165 188   Cardiac Enzymes:   No results for input(s): CKTOTAL, CKMB, CKMBINDEX, TROPONINI in the last 168 hours. BNP (last 3 results) No results for input(s): BNP  in the last 8760 hours.  ProBNP (last 3 results) No results for input(s): PROBNP in the last 8760 hours.  CBG: No results for input(s): GLUCAP in the last 168 hours.  Recent Results (from the past 240 hour(s))  Urine culture     Status: Abnormal   Collection Time: 02/02/17 11:15 AM  Result Value Ref Range Status   Specimen Description URINE, RANDOM  Final   Special Requests NONE  Final   Culture >=100,000 COLONIES/mL ESCHERICHIA COLI (A)  Final   Report Status 02/04/2017 FINAL  Final   Organism ID, Bacteria ESCHERICHIA COLI (A)  Final      Susceptibility   Escherichia coli - MIC*    AMPICILLIN 16 INTERMEDIATE Intermediate     CEFAZOLIN <=4 SENSITIVE Sensitive     CEFTRIAXONE <=1 SENSITIVE Sensitive     CIPROFLOXACIN 2 INTERMEDIATE Intermediate     GENTAMICIN <=1  SENSITIVE Sensitive     IMIPENEM <=0.25 SENSITIVE Sensitive     NITROFURANTOIN 128 RESISTANT Resistant     TRIMETH/SULFA <=20 SENSITIVE Sensitive     AMPICILLIN/SULBACTAM 4 SENSITIVE Sensitive     PIP/TAZO <=4 SENSITIVE Sensitive     Extended ESBL NEGATIVE Sensitive     * >=100,000 COLONIES/mL ESCHERICHIA COLI  Blood culture (routine x 2)     Status: Abnormal   Collection Time: 02/02/17 12:07 PM  Result Value Ref Range Status   Specimen Description BLOOD LEFT ANTECUBITAL  Final   Special Requests BOTTLES DRAWN AEROBIC AND ANAEROBIC BCAV  Final   Culture  Setup Time   Final    GRAM NEGATIVE RODS ANAEROBIC BOTTLE ONLY CRITICAL RESULT CALLED TO, READ BACK BY AND VERIFIED WITH: Cristopher Peru, PHARMD @[phone]  02/03/17 MKELLY,MLT Performed at [facility], [address], [place], [place] [address]    Culture ESCHERICHIA COLI (A)  Final   Report Status 02/05/2017 FINAL  Final   Organism ID, Bacteria ESCHERICHIA COLI  Final      Susceptibility   Escherichia coli - MIC*    AMPICILLIN <=2 SENSITIVE Sensitive     CEFAZOLIN <=4 SENSITIVE Sensitive     CEFEPIME <=1 SENSITIVE Sensitive     CEFTAZIDIME <=1 SENSITIVE Sensitive     CEFTRIAXONE <=1 SENSITIVE Sensitive     CIPROFLOXACIN <=0.25 SENSITIVE Sensitive     GENTAMICIN <=1 SENSITIVE Sensitive     IMIPENEM <=0.25 SENSITIVE Sensitive     TRIMETH/SULFA <=20 SENSITIVE Sensitive     AMPICILLIN/SULBACTAM <=2 SENSITIVE Sensitive     PIP/TAZO <=4 SENSITIVE Sensitive     Extended ESBL NEGATIVE Sensitive     * ESCHERICHIA COLI  Blood Culture ID Panel (Reflexed)     Status: Abnormal   Collection Time: 02/02/17 12:07 PM  Result Value Ref Range Status   Enterococcus species NOT DETECTED NOT DETECTED Final   Listeria monocytogenes NOT DETECTED NOT DETECTED Final   Staphylococcus species NOT DETECTED NOT DETECTED Final   Staphylococcus aureus NOT DETECTED NOT DETECTED Final   Streptococcus species NOT DETECTED NOT DETECTED Final   Streptococcus  agalactiae NOT DETECTED NOT DETECTED Final   Streptococcus pneumoniae NOT DETECTED NOT DETECTED Final   Streptococcus pyogenes NOT DETECTED NOT DETECTED Final   Acinetobacter baumannii NOT DETECTED NOT DETECTED Final   Enterobacteriaceae species DETECTED (A) NOT DETECTED Final    Comment: Enterobacteriaceae represent a large family of gram-negative bacteria, not a single organism. CRITICAL RESULT CALLED TO, READ BACK BY AND VERIFIED WITH: T PICKERING, PHARMD @0646  02/03/17 MKELLY,MLT    Enterobacter cloacae  complex NOT DETECTED NOT DETECTED Final   Escherichia coli DETECTED (A) NOT DETECTED Final    Comment: CRITICAL RESULT CALLED TO, READ BACK BY AND VERIFIED WITH: T PICKERING, PHARMD @0646  02/03/17 MKELLY,MLT    Klebsiella oxytoca NOT DETECTED NOT DETECTED Final   Klebsiella pneumoniae NOT DETECTED NOT DETECTED Final   Proteus species NOT DETECTED NOT DETECTED Final   Serratia marcescens NOT DETECTED NOT DETECTED Final   Carbapenem resistance NOT DETECTED NOT DETECTED Final   Haemophilus influenzae NOT DETECTED NOT DETECTED Final   Neisseria meningitidis NOT DETECTED NOT DETECTED Final   Pseudomonas aeruginosa NOT DETECTED NOT DETECTED Final   Candida albicans NOT DETECTED NOT DETECTED Final   Candida glabrata NOT DETECTED NOT DETECTED Final   Candida krusei NOT DETECTED NOT DETECTED Final   Candida parapsilosis NOT DETECTED NOT DETECTED Final   Candida tropicalis NOT DETECTED NOT DETECTED Final    Comment: Performed at [facility], [place] [address], [place], [place] [address]  Blood culture (routine x 2)     Status: None (Preliminary result)   Collection Time: 02/02/17 12:15 PM  Result Value Ref Range Status   Specimen Description BLOOD LEFT HAND  Final   Special Requests IN PEDIATRIC BOTTLE BCLV  Final   Culture   Final    NO GROWTH 2 DAYS Performed at [facility], [place] [address] [place], [place] [address]    Report Status PENDING  Incomplete  Urine culture      Status: Abnormal   Collection Time: 02/02/17  5:36 PM  Result Value Ref Range Status   Specimen Description URINE, CATHETERIZED R RENAL PELVIS  Final   Special Requests NONE  Final   Culture >=100,000 COLONIES/mL ESCHERICHIA COLI (A)  Final   Report Status 02/05/2017 FINAL  Final   Organism ID, Bacteria ESCHERICHIA COLI (A)  Final      Susceptibility   Escherichia coli - MIC*    AMPICILLIN >=32 RESISTANT Resistant     CEFAZOLIN <=4 SENSITIVE Sensitive     CEFTRIAXONE <=1 SENSITIVE Sensitive     CIPROFLOXACIN >=4 RESISTANT Resistant     GENTAMICIN <=1 SENSITIVE Sensitive     IMIPENEM <=0.25 SENSITIVE Sensitive     NITROFURANTOIN <=16 SENSITIVE Sensitive     TRIMETH/SULFA <=20 SENSITIVE Sensitive     AMPICILLIN/SULBACTAM 8 SENSITIVE Sensitive     PIP/TAZO <=4 SENSITIVE Sensitive     Extended ESBL NEGATIVE Sensitive     * >=100,000 COLONIES/mL ESCHERICHIA COLI  MRSA PCR Screening     Status: None   Collection Time: 02/02/17  8:19 PM  Result Value Ref Range Status   MRSA by PCR NEGATIVE NEGATIVE Final    Comment:        The GeneXpert MRSA Assay (FDA approved for NASAL specimens only), is one component of a comprehensive MRSA colonization surveillance program. It is not intended to diagnose MRSA infection nor to guide or monitor treatment for MRSA infections.   Culture, blood (routine x 2)     Status: None (Preliminary result)   Collection Time: 02/04/17  9:12 AM  Result Value Ref Range Status   Specimen Description BLOOD RIGHT ANTECUBITAL  Final   Special Requests Blood Culture adequate volume  Final   Culture   Final    NO GROWTH < 12 HOURS Performed at [facility], [place] [address], [place], [place] [address]    Report Status PENDING  Incomplete  Culture, blood (routine x 2)  Status: None (Preliminary result)   Collection Time: 02/04/17  9:20 AM  Result Value Ref Range Status   Specimen Description BLOOD RIGHT ARM  Final   Special Requests Blood Culture  adequate volume  Final   Culture   Final    NO GROWTH < 12 HOURS Performed at [facility], [place] [address], [place], [place] [address]    Report Status PENDING  Incomplete     Studies: No results found.  Scheduled Meds: . cefTRIAXone (ROCEPHIN)  IV  2 g Intravenous Q24H  . diphenhydrAMINE  25 mg Oral QHS  . enoxaparin (LOVENOX) injection  40 mg Subcutaneous Q24H  . estropipate  0.75 mg Oral Daily  . ezetimibe  10 mg Oral Daily  . hydrocortisone sod succinate (SOLU-CORTEF) inj  50 mg Intravenous Q12H  . loratadine  10 mg Oral Daily  . medroxyPROGESTERone  2.5 mg Oral Daily  . pantoprazole  40 mg Oral Daily  . phenazopyridine  100 mg Oral TID  . polyethylene glycol  8.5 g Oral QHS  . sodium chloride flush  3 mL Intravenous Q12H    Continuous Infusions:    Time spent: 65mins  Josemaria Brining MD, PhD  [facility] Pager [phone]. If 7PM-7AM, please contact night-coverage at [email or web address], password [facility] 02/05/2017, 5:24 PM  LOS: 3 days

## 2017-02-05 NOTE — Telephone Encounter (Signed)
Pt scheduled to see Dr. Rosendo Gros 02/21/17@9 :50am, pt to arrive there at 10:20am.

## 2017-02-05 NOTE — Telephone Encounter (Signed)
Sepsis should not have affected the iron infusion. She could have capsule endoscopy performed when she feels up to it

## 2017-02-05 NOTE — Telephone Encounter (Signed)
Sure

## 2017-02-06 ENCOUNTER — Encounter: Payer: Self-pay | Admitting: Internal Medicine

## 2017-02-06 ENCOUNTER — Encounter (HOSPITAL_COMMUNITY): Payer: Self-pay | Admitting: Urology

## 2017-02-06 DIAGNOSIS — N1330 Unspecified hydronephrosis: Secondary | ICD-10-CM

## 2017-02-06 LAB — BASIC METABOLIC PANEL
Anion gap: 7 (ref 5–15)
BUN: 10 mg/dL (ref 6–20)
CO2: 25 mmol/L (ref 22–32)
Calcium: 8.5 mg/dL — ABNORMAL LOW (ref 8.9–10.3)
Chloride: 109 mmol/L (ref 101–111)
Creatinine, Ser: 0.57 mg/dL (ref 0.44–1.00)
GFR calc Af Amer: >60 mL/min (ref >60)
GFR calc non Af Amer: >60 mL/min (ref >60)
Glucose, Bld: 125 mg/dL — ABNORMAL HIGH (ref 65–99)
Potassium: 3.2 mmol/L — ABNORMAL LOW (ref 3.5–5.1)
Sodium: 141 mmol/L (ref 135–145)

## 2017-02-06 LAB — MAGNESIUM: Magnesium: 1.8 mg/dL (ref 1.7–2.4)

## 2017-02-06 LAB — CBC
HCT: 31.5 % — ABNORMAL LOW (ref 36.0–46.0)
Hemoglobin: 9.4 g/dL — ABNORMAL LOW (ref 12.0–15.0)
MCH: 21.8 pg — ABNORMAL LOW (ref 26.0–34.0)
MCHC: 29.8 g/dL — ABNORMAL LOW (ref 30.0–36.0)
MCV: 72.9 fL — ABNORMAL LOW (ref 78.0–100.0)
Platelets: 207 10*3/uL (ref 150–400)
RBC: 4.32 MIL/uL (ref 3.87–5.11)
RDW: 29 % — ABNORMAL HIGH (ref 11.5–15.5)
WBC: 6.5 10*3/uL (ref 4.0–10.5)

## 2017-02-06 MED ORDER — PROPRANOLOL HCL 40 MG PO TABS: 40.0000 mg | ORAL_TABLET | Freq: Every day | ORAL | 3 refills | Status: DC

## 2017-02-06 MED ORDER — CEFPODOXIME PROXETIL 200 MG PO TABS: 200.0000 mg | ORAL_TABLET | Freq: Two times a day (BID) | ORAL | 0 refills | Status: DC

## 2017-02-06 MED ORDER — CEFPODOXIME PROXETIL 200 MG PO TABS
200.0000 mg | ORAL_TABLET | Freq: Two times a day (BID) | ORAL | Status: DC
  Filled 2017-02-06: qty 1

## 2017-02-06 MED ORDER — POTASSIUM CHLORIDE CRYS ER 20 MEQ PO TBCR: 40.0000 meq | EXTENDED_RELEASE_TABLET | Freq: Once | ORAL | Status: DC

## 2017-02-06 MED ORDER — HYDROCHLOROTHIAZIDE 25 MG PO TABS: 25.0000 mg | ORAL_TABLET | Freq: Every day | ORAL | 3 refills | Status: DC

## 2017-02-06 NOTE — Discharge Summary (Signed)
Discharge Summary  Victoria Mosley HDQ:[id number] DOB: 09-23-1951  PCP: Alysia Penna, MD  Admit date: 02/02/2017 Discharge date: 02/06/2017  Time spent: <66mins  Recommendations for Outpatient Follow-up:  1. F/u with PMD within a week  for hospital discharge follow up, repeat cbc/bmp at follow up  Discharge Diagnoses:  Active Hospital Problems   Diagnosis Date Noted  . Sepsis ([place]) 02/02/2017    Resolved Hospital Problems   Diagnosis Date Noted Date Resolved  No resolved problems to display.    Discharge Condition: stable  Diet recommendation: heart healthy/carb modified  Filed Weights   02/02/17 0807 02/02/17 0813 02/02/17 2100  Weight: 87.5 kg (193 lb) 87.5 kg (193 lb) 87.5 kg (192 lb 14.4 oz)    History of present illness:  PCP: Alysia Penna, MD   Chief Complaint: right lower quadrant abdominal pain  HPI: Victoria Mosley is a 66 y.o. female   With past h/o kidney stone, neurogenic bladder, h/o uti, h/o iron deficiency anemia who presented to the ED with above complaints, CTab/pel +obstructing right distal ureteral calculus/right hydronephrosis, basic labs unremarkable, but patient is hypotensive, ua + for uti, she received fluids resuscitation, she is started on rocephin after urine and blood culture obtained in the ED, due to dropping blood pressure despite fluids resuscitation, EDP consulted ICU attending, EDP also consulted urology , she was brought to the OR from the ED and had stent placed in. patient is admitted to the stepdown unit from the PACU.  Patient denies fever, no cough, no chest pain, no constipation, no diarrhea.  Hospital Course:  Active Problems:   Sepsis ([place])   Sepsispresented on admission with leukocytosis wbc 14.5, lactic acidosis 2.7, hypotension requiring fluids resuscitation and stress dose steroids Sepsis is from ecoli bacteremia/ecoli UTI/obstructed stone Rocephin started since admission, on stress dose steroids, she received ivf,  home meds hctz/propranolol held in the hospital. Urine culture/blood culture +ecoli, sensitive to rocephin, continue rocephin, repeat blood culture no growth,  Blood pressure has improved, wbc and lactic acid normalized,  she is discharged home on vantin to finish abx treatment course.   UTI/right pyelonephritis/ 39mm right distal ureteral calculus/right hydronephrosis,  s/p Right ureteral stent placement,  She is treated with rocephin in the hospital and discharged on vantin f/u with urology on 4/19 for further treatment  Hypokalemia/hypomagnesemia; replace k/mag  Iron deficiency anemia: received iv iron recently, she underwent egd and colonoscopy on Monday, she is to get capsule study in 1-2 weeks.  She is to follow up with gi. hgb 10.7-8.8-8.1-8.3-9.4  Chronic migraine: she report she take propranolol for this, propranolol held in the hospital due to hypotension. She is instructed to check blood pressure at home, may resume in three days if blood pressure stable.   She reports her urologist put her on HCTZ to prevent kidney stone, she is to discuss with urology about ongoing use of HCTZ.   a1c 6.8: diet control for now, pmd to continue monitor.  DVT prophylaxis while in the hospital: lovenox  Consultants:urology  Code Status:full   Family Communication:Patient and husband at bedside  Disposition Plan: home on 4/10 with oral abx   Procedures:  s/p Right ureteral stent placement,  Antibiotics:  Rocephin    Discharge Exam: BP 126/60 (BP Location: Right Arm)   Pulse 63   Temp 98.1 F (36.7 C) (Oral)   Resp 18   Ht 5\' 7"  (1.702 m)   Wt 87.5 kg (192 lb 14.4 oz)   SpO2 94%  BMI 30.21 kg/m     General:  NAD  Cardiovascular: RRR  Respiratory: CTABL  Abdomen: right sided tenderness has resolved, no guarding, positive BS  Musculoskeletal: No Edema  Neuro: aaox3    Discharge Instructions You were cared for by a hospitalist during your  hospital stay. If you have any questions about your discharge medications or the care you received while you were in the hospital after you are discharged, you can call the unit and asked to speak with the hospitalist on call if the hospitalist that took care of you is not available. Once you are discharged, your primary care physician will handle any further medical issues. Please note that NO REFILLS for any discharge medications will be authorized once you are discharged, as it is imperative that you return to your primary care physician (or establish a relationship with a primary care physician if you do not have one) for your aftercare needs so that they can reassess your need for medications and monitor your lab values.  Discharge Instructions    Diet - low sodium heart healthy    Complete by:  As directed    Low carb   Increase activity slowly    Complete by:  As directed      Allergies as of 02/06/2017      Reactions   Levaquin [levofloxacin In D5w] Anaphylaxis, Rash, Other (See Comments)   Hot, dizziness, diarrhea   Pravastatin Sodium [pravachol]    Joint pain(severe)   Biaxin [clarithromycin] Nausea And Vomiting   Ciprofloxacin Nausea Only   Clarithromycin Nausea Only   Dust Mite Extract Other (See Comments)     Grass, trees also- cause Runny nose, sneezing (allergy symptoms)   Septra [sulfamethoxazole-trimethoprim]    diarrhea      Medication List    STOP taking these medications   diazepam 5 MG tablet Commonly known as:  VALIUM   fluconazole 150 MG tablet Commonly known as:  DIFLUCAN   hyoscyamine 0.125 MG SL tablet Commonly known as:  LEVSIN SL   predniSONE 5 MG tablet Commonly known as:  DELTASONE     TAKE these medications   acetaminophen 500 MG tablet Commonly known as:  TYLENOL Take 1,000 mg by mouth every 6 (six) hours as needed for mild pain.   Butalbital-APAP-Caffeine 50-300-40 MG Caps Take 1 capsule by mouth 2 (two) times daily as needed  (headache).   cefpodoxime 200 MG tablet Commonly known as:  VANTIN Take 1 tablet (200 mg total) by mouth every 12 (twelve) hours.   Cranberry 500 MG Tabs Take by mouth 2 (two) times daily.   diphenhydrAMINE 25 mg capsule Commonly known as:  BENADRYL Take 25 mg by mouth at bedtime.   estropipate 0.75 MG tablet Commonly known as:  OGEN Take 1 tablet (0.75 mg total) by mouth daily.   ezetimibe 10 MG tablet Commonly known as:  ZETIA Take 1 tablet (10 mg total) by mouth daily.   hydrochlorothiazide 25 MG tablet Commonly known as:  HYDRODIURIL Take 1 tablet (25 mg total) by mouth daily. Hold for another three days, can restart on 4/13 Start taking on:  02/09/2017 What changed:  additional instructions   loratadine 10 MG tablet Commonly known as:  CLARITIN Take 10 mg by mouth daily.   medroxyPROGESTERone 2.5 MG tablet Commonly known as:  PROVERA Take 1 tablet (2.5 mg total) by mouth daily.   naproxen sodium 220 MG tablet Commonly known as:  ANAPROX Take 220 mg by mouth 4 (four)  times daily as needed.   omeprazole 20 MG capsule Commonly known as:  PRILOSEC Take 1 capsule (20 mg total) by mouth daily.   polyethylene glycol packet Commonly known as:  MIRALAX / GLYCOLAX Take 17 g by mouth daily. States takes 1/3 packet at bedtime   potassium gluconate 595 (99 K) MG Tabs tablet Take 1,190 mg by mouth every evening.   PROBIOTIC PO Take by mouth. Takes 5mg  once daily.   propranolol 40 MG tablet Commonly known as:  INDERAL Take 1 tablet (40 mg total) by mouth daily. Hold for three more days, can resume on 4/13 Start taking on:  02/09/2017 What changed:  additional instructions      Allergies  Allergen Reactions  . Levaquin [Levofloxacin In D5w] Anaphylaxis, Rash and Other (See Comments)    Hot, dizziness, diarrhea  . Pravastatin Sodium [Pravachol]     Joint pain(severe)  . Biaxin [Clarithromycin] Nausea And Vomiting  . Ciprofloxacin Nausea Only  . Clarithromycin  Nausea Only  . Dust Mite Extract Other (See Comments)      Grass, trees also- cause Runny nose, sneezing (allergy symptoms)  . Septra [Sulfamethoxazole-Trimethoprim]     diarrhea   Follow-up Information    Ardis Hughs, MD Follow up.   Specialty:  Urology Contact information: [place] [address] [phone]        Alysia Penna, MD Follow up in 1 week(s).   Specialty:  Family Medicine Why:  hospital discharge follow up, repeat cbc/bmp at follow up please check your blood pressure at home and bring in record for your doctor to review. Contact information: [place] [address] [phone]            The results of significant diagnostics from this hospitalization (including imaging, microbiology, ancillary and laboratory) are listed below for reference.    Significant Diagnostic Studies: Ct Abdomen Pelvis W Contrast  Result Date: 02/02/2017 CLINICAL DATA:  Right lower quadrant abdominal pain. Upper and lower endoscopy a few days prior. EXAM: CT ABDOMEN AND PELVIS WITH CONTRAST TECHNIQUE: Multidetector CT imaging of the abdomen and pelvis was performed using the standard protocol following bolus administration of intravenous contrast. CONTRAST:  171mL ISOVUE-300 IOPAMIDOL (ISOVUE-300) INJECTION 61% COMPARISON:  08/28/2012 CT abdomen/ pelvis. FINDINGS: Lower chest: Mild bibasilar scarring versus atelectasis. Hepatobiliary: Diffuse hepatic steatosis. Simple 1.0 cm lateral segment left liver lobe cyst. No additional liver lesions. Normal gallbladder with no radiopaque cholelithiasis. No biliary ductal dilatation. Pancreas: Normal, with no mass or duct dilation. Spleen: Normal size. No mass. Adrenals/Urinary Tract: Normal adrenals. Obstructing 7 x 5 mm stone in the distal right pelvic ureter with mild right hydroureteronephrosis and asymmetric right perinephric fat stranding. Slight urothelial wall thickening throughout the right renal  collecting system and right ureter. No left hydronephrosis. subcentimeter hypodense renal cortical lesions in both kidneys are too small to characterize and require no further follow-up. Limited bladder visualization due to streak artifact, with no gross bladder abnormality. Stomach/Bowel: Grossly normal stomach. Normal caliber small bowel with no small bowel wall thickening. Normal appendix. Mild sigmoid diverticulosis, with no evidence of large bowel wall thickening or pericolonic fat stranding. Vascular/Lymphatic: Atherosclerotic nonaneurysmal abdominal aorta. Patent portal, splenic, hepatic and renal veins. No pathologically enlarged lymph nodes in the abdomen or pelvis. Reproductive: Grossly stable enlarged myomatous uterus with dominant 4.8 cm left uterine fibroid. No adnexal mass. Other: No pneumoperitoneum, ascites or focal fluid collection. Small fat containing umbilical hernia. Musculoskeletal: No aggressive appearing focal osseous  lesions. Partially visualized bilateral total hip arthroplasty. Marked degenerative disc disease at L5-S1. IMPRESSION: 1. Obstructing 7 x 5 mm distal right pelvic ureteral stone with mild right hydroureteronephrosis. Minimal urothelial wall thickening throughout the right urinary tract is probably due to the acute obstruction, although a superimposed ascending right urinary tract infection is difficult to exclude. 2. Aortic atherosclerosis . 3. Diffuse hepatic steatosis. 4. Mild sigmoid diverticulosis, with no evidence of acute diverticulitis. 5. Enlarged myomatous uterus. 6. Small fat containing umbilical hernia. Electronically Signed   By: Ilona Sorrel M.D.   On: 02/02/2017 11:24   Dg Retrograde Pyelogram  Result Date: 02/03/2017 CLINICAL DATA:  66 year old female with a history of nephrolithiasis EXAM: RETROGRADE PYELOGRAM COMPARISON:  CT 02/02/2017 FINDINGS: Fluoroscopy: 9 minutes 48 seconds Limited intraoperative fluoroscopic spot images during retrograde pyelogram  demonstrate placement of a right ureteral stent. No opacification of the collecting system or ureter. IMPRESSION: Limited images of retrograde pyelogram demonstrate placement of a right ureteral stent. Please refer to the dictated operative report for full details of intraoperative findings and procedure. Electronically Signed   By: Corrie Mckusick D.O.   On: 02/03/2017 08:51   Dg Chest Port 1 View  Result Date: 02/02/2017 CLINICAL DATA:  Sepsis and fever, known RIGHT nephrolithiasis. EXAM: PORTABLE CHEST 1 VIEW COMPARISON:  Chest radiograph November 30, 2014 FINDINGS: Elevated RIGHT hemidiaphragm. Strandy densities LEFT lung base, to lesser extent on the RIGHT. No pleural effusion. No pneumothorax. Cardiac silhouette is upper limits of normal in size, mildly calcified aortic knob. No pneumothorax. Soft tissue planes and included osseous structures are normal. IMPRESSION: Bibasilar atelectasis/scarring. Borderline cardiomegaly. Electronically Signed   By: Elon Alas M.D.   On: 02/02/2017 22:11    Microbiology: Recent Results (from the past 240 hour(s))  Urine culture     Status: Abnormal   Collection Time: 02/02/17 11:15 AM  Result Value Ref Range Status   Specimen Description URINE, RANDOM  Final   Special Requests NONE  Final   Culture >=100,000 COLONIES/mL ESCHERICHIA COLI (A)  Final   Report Status 02/04/2017 FINAL  Final   Organism ID, Bacteria ESCHERICHIA COLI (A)  Final      Susceptibility   Escherichia coli - MIC*    AMPICILLIN 16 INTERMEDIATE Intermediate     CEFAZOLIN <=4 SENSITIVE Sensitive     CEFTRIAXONE <=1 SENSITIVE Sensitive     CIPROFLOXACIN 2 INTERMEDIATE Intermediate     GENTAMICIN <=1 SENSITIVE Sensitive     IMIPENEM <=0.25 SENSITIVE Sensitive     NITROFURANTOIN 128 RESISTANT Resistant     TRIMETH/SULFA <=20 SENSITIVE Sensitive     AMPICILLIN/SULBACTAM 4 SENSITIVE Sensitive     PIP/TAZO <=4 SENSITIVE Sensitive     Extended ESBL NEGATIVE Sensitive     * >=100,000  COLONIES/mL ESCHERICHIA COLI  Blood culture (routine x 2)     Status: Abnormal   Collection Time: 02/02/17 12:07 PM  Result Value Ref Range Status   Specimen Description BLOOD LEFT ANTECUBITAL  Final   Special Requests BOTTLES DRAWN AEROBIC AND ANAEROBIC BCAV  Final   Culture  Setup Time   Final    GRAM NEGATIVE RODS ANAEROBIC BOTTLE ONLY CRITICAL RESULT CALLED TO, READ BACK BY AND VERIFIED WITH: [place], PHARMD @[phone]  02/03/17 MKELLY,MLT Performed at [facility], [address], [place], [place] [address]    Culture ESCHERICHIA COLI (A)  Final   Report Status 02/05/2017 FINAL  Final   Organism ID, Bacteria ESCHERICHIA COLI  Final  Susceptibility   Escherichia coli - MIC*    AMPICILLIN <=2 SENSITIVE Sensitive     CEFAZOLIN <=4 SENSITIVE Sensitive     CEFEPIME <=1 SENSITIVE Sensitive     CEFTAZIDIME <=1 SENSITIVE Sensitive     CEFTRIAXONE <=1 SENSITIVE Sensitive     CIPROFLOXACIN <=0.25 SENSITIVE Sensitive     GENTAMICIN <=1 SENSITIVE Sensitive     IMIPENEM <=0.25 SENSITIVE Sensitive     TRIMETH/SULFA <=20 SENSITIVE Sensitive     AMPICILLIN/SULBACTAM <=2 SENSITIVE Sensitive     PIP/TAZO <=4 SENSITIVE Sensitive     Extended ESBL NEGATIVE Sensitive     * ESCHERICHIA COLI  Blood Culture ID Panel (Reflexed)     Status: Abnormal   Collection Time: 02/02/17 12:07 PM  Result Value Ref Range Status   Enterococcus species NOT DETECTED NOT DETECTED Final   Listeria monocytogenes NOT DETECTED NOT DETECTED Final   Staphylococcus species NOT DETECTED NOT DETECTED Final   Staphylococcus aureus NOT DETECTED NOT DETECTED Final   Streptococcus species NOT DETECTED NOT DETECTED Final   Streptococcus agalactiae NOT DETECTED NOT DETECTED Final   Streptococcus pneumoniae NOT DETECTED NOT DETECTED Final   Streptococcus pyogenes NOT DETECTED NOT DETECTED Final   Acinetobacter baumannii NOT DETECTED NOT DETECTED Final   Enterobacteriaceae species DETECTED (A) NOT DETECTED Final     Comment: Enterobacteriaceae represent a large family of gram-negative bacteria, not a single organism. CRITICAL RESULT CALLED TO, READ BACK BY AND VERIFIED WITH: T PICKERING, PHARMD @0646  02/03/17 MKELLY,MLT    Enterobacter cloacae complex NOT DETECTED NOT DETECTED Final   Escherichia coli DETECTED (A) NOT DETECTED Final    Comment: CRITICAL RESULT CALLED TO, READ BACK BY AND VERIFIED WITH: T PICKERING, PHARMD @0646  02/03/17 MKELLY,MLT    Klebsiella oxytoca NOT DETECTED NOT DETECTED Final   Klebsiella pneumoniae NOT DETECTED NOT DETECTED Final   Proteus species NOT DETECTED NOT DETECTED Final   Serratia marcescens NOT DETECTED NOT DETECTED Final   Carbapenem resistance NOT DETECTED NOT DETECTED Final   Haemophilus influenzae NOT DETECTED NOT DETECTED Final   Neisseria meningitidis NOT DETECTED NOT DETECTED Final   Pseudomonas aeruginosa NOT DETECTED NOT DETECTED Final   Candida albicans NOT DETECTED NOT DETECTED Final   Candida glabrata NOT DETECTED NOT DETECTED Final   Candida krusei NOT DETECTED NOT DETECTED Final   Candida parapsilosis NOT DETECTED NOT DETECTED Final   Candida tropicalis NOT DETECTED NOT DETECTED Final    Comment: Performed at [facility], [place] [address] [place], [place] [address]  Blood culture (routine x 2)     Status: None (Preliminary result)   Collection Time: 02/02/17 12:15 PM  Result Value Ref Range Status   Specimen Description BLOOD LEFT HAND  Final   Special Requests IN PEDIATRIC BOTTLE BCLV  Final   Culture   Final    NO GROWTH 4 DAYS Performed at [facility], [place] [address], [place], [place] [address]    Report Status PENDING  Incomplete  Urine culture     Status: Abnormal   Collection Time: 02/02/17  5:36 PM  Result Value Ref Range Status   Specimen Description URINE, CATHETERIZED R RENAL PELVIS  Final   Special Requests NONE  Final   Culture >=100,000 COLONIES/mL ESCHERICHIA COLI (A)  Final   Report Status 02/05/2017 FINAL  Final    Organism ID, Bacteria ESCHERICHIA COLI (A)  Final      Susceptibility   Escherichia coli - MIC*    AMPICILLIN >=32 RESISTANT  Resistant     CEFAZOLIN <=4 SENSITIVE Sensitive     CEFTRIAXONE <=1 SENSITIVE Sensitive     CIPROFLOXACIN >=4 RESISTANT Resistant     GENTAMICIN <=1 SENSITIVE Sensitive     IMIPENEM <=0.25 SENSITIVE Sensitive     NITROFURANTOIN <=16 SENSITIVE Sensitive     TRIMETH/SULFA <=20 SENSITIVE Sensitive     AMPICILLIN/SULBACTAM 8 SENSITIVE Sensitive     PIP/TAZO <=4 SENSITIVE Sensitive     Extended ESBL NEGATIVE Sensitive     * >=100,000 COLONIES/mL ESCHERICHIA COLI  MRSA PCR Screening     Status: None   Collection Time: 02/02/17  8:19 PM  Result Value Ref Range Status   MRSA by PCR NEGATIVE NEGATIVE Final    Comment:        The GeneXpert MRSA Assay (FDA approved for NASAL specimens only), is one component of a comprehensive MRSA colonization surveillance program. It is not intended to diagnose MRSA infection nor to guide or monitor treatment for MRSA infections.   Culture, blood (routine x 2)     Status: None (Preliminary result)   Collection Time: 02/04/17  9:12 AM  Result Value Ref Range Status   Specimen Description BLOOD RIGHT ANTECUBITAL  Final   Special Requests Blood Culture adequate volume  Final   Culture   Final    NO GROWTH 2 DAYS Performed at [facility], [place] [address] [place], [place] [address]    Report Status PENDING  Incomplete  Culture, blood (routine x 2)     Status: None (Preliminary result)   Collection Time: 02/04/17  9:20 AM  Result Value Ref Range Status   Specimen Description BLOOD RIGHT ARM  Final   Special Requests Blood Culture adequate volume  Final   Culture   Final    NO GROWTH 2 DAYS Performed at [facility], [place] [address], [place], [place] [address]    Report Status PENDING  Incomplete     Labs: Basic Metabolic Panel:  Recent Labs Lab 02/02/17 0824 02/03/17 0104 02/04/17 0359 02/05/17 0518  02/06/17 0541  NA 141 141 142 142 141  K 3.6 3.3* 3.5 3.9 3.2*  CL 105 110 113* 113* 109  CO2 26 22 25 23 25   GLUCOSE 132* 173* 136* 137* 125*  BUN 19 14 18 19 10   CREATININE 0.91 0.67 0.61 0.55 0.57  CALCIUM 9.5 8.1* 7.9* 8.2* 8.5*  MG  --   --  1.6*  --  1.8   Liver Function Tests:  Recent Labs Lab 02/02/17 0824 02/03/17 0104  AST 34 30  ALT 25 22  ALKPHOS 63 40  BILITOT 0.6 0.8  PROT 7.2 5.5*  ALBUMIN 3.9 2.7*    Recent Labs Lab 02/02/17 0824  LIPASE 19   No results for input(s): AMMONIA in the last 168 hours. CBC:  Recent Labs Lab 02/02/17 0824 02/03/17 0104 02/04/17 0359 02/05/17 0518 02/06/17 0541  WBC 9.5 14.5* 12.5* 10.4 6.5  HGB 10.7* 8.8* 8.1* 8.3* 9.4*  HCT 35.6* 29.6* 27.8* 28.2* 31.5*  MCV 69.4* 72.2* 71.3* 71.0* 72.9*  PLT 251 182 165 188 207   Cardiac Enzymes: No results for input(s): CKTOTAL, CKMB, CKMBINDEX, TROPONINI in the last 168 hours. BNP: BNP (last 3 results) No results for input(s): BNP in the last 8760 hours.  ProBNP (last 3 results) No results for input(s): PROBNP in the last 8760 hours.  CBG: No results for input(s): GLUCAP in the last 168 hours.     Signed:  Jahnia Hewes MD, PhD  [facility] 02/06/2017, 11:24 AM

## 2017-02-07 LAB — CULTURE, BLOOD (ROUTINE X 2): Culture: NO GROWTH

## 2017-02-09 ENCOUNTER — Telehealth: Payer: Self-pay | Admitting: Internal Medicine

## 2017-02-09 LAB — CULTURE, BLOOD (ROUTINE X 2)
Culture: NO GROWTH
Culture: NO GROWTH
Special Requests: ADEQUATE
Special Requests: ADEQUATE

## 2017-02-12 ENCOUNTER — Ambulatory Visit (INDEPENDENT_AMBULATORY_CARE_PROVIDER_SITE_OTHER): Payer: 59 | Admitting: Family Medicine

## 2017-02-12 VITALS — BP 119/80 | HR 93 | Temp 98.7°F | Ht 67.0 in | Wt 192.0 lb

## 2017-02-12 DIAGNOSIS — E876 Hypokalemia: Secondary | ICD-10-CM | POA: Diagnosis not present

## 2017-02-12 DIAGNOSIS — N390 Urinary tract infection, site not specified: Secondary | ICD-10-CM | POA: Diagnosis not present

## 2017-02-12 DIAGNOSIS — D508 Other iron deficiency anemias: Secondary | ICD-10-CM | POA: Diagnosis not present

## 2017-02-12 DIAGNOSIS — N201 Calculus of ureter: Secondary | ICD-10-CM

## 2017-02-12 DIAGNOSIS — A419 Sepsis, unspecified organism: Secondary | ICD-10-CM | POA: Diagnosis not present

## 2017-02-12 NOTE — Patient Instructions (Signed)
WE NOW OFFER   Victoria Mosley's FAST TRACK!!!  SAME DAY Appointments for ACUTE CARE  Such as: Sprains, Injuries, cuts, abrasions, rashes, muscle pain, joint pain, back pain Colds, flu, sore throats, headache, allergies, cough, fever  Ear pain, sinus and eye infections Abdominal pain, nausea, vomiting, diarrhea, upset stomach Animal/insect bites  3 Easy Ways to Schedule: Walk-In Scheduling Call in scheduling Mychart Sign-up: [email or web address]         

## 2017-02-12 NOTE — Patient Instructions (Addendum)
Berklee Battey [place]ois  02/12/2017   Your procedure is scheduled on: 02-15-17  Report to [facility]  Entrance    Take [place]  elevators to 3rd floor to  [place] at 530AM.     Call this number if you have problems the morning of surgery [phone]    Remember: ONLY 1 PERSON MAY GO WITH YOU TO SHORT STAY TO GET  READY MORNING OF YOUR SURGERY.  Do not eat food or drink liquids :After Midnight.     Take these medicines the morning of surgery with A SIP OF WATER: tylenol as needed, ezetimibe(Zetia), Claritin as needed                                You may not have any metal on your body including hair pins and              piercings  Do not wear jewelry, make-up, lotions, powders or perfumes, deodorant             Do not wear nail polish.  Do not shave  48 hours prior to surgery.     Do not bring valuables to the hospital. [place].  Contacts, dentures or bridgework may not be worn into surgery.      Patients discharged the day of surgery will not be allowed to drive home.  Name and phone number of your driver:  Special Instructions: N/A              Please read over the following fact sheets you were given: _____________________________________________________________________             [facility] - Preparing for Surgery Before surgery, you can play an important role.  Because skin is not sterile, your skin needs to be as free of germs as possible.  You can reduce the number of germs on your skin by washing with CHG (chlorahexidine gluconate) soap before surgery.  CHG is an antiseptic cleaner which kills germs and bonds with the skin to continue killing germs even after washing. Please DO NOT use if you have an allergy to CHG or antibacterial soaps.  If your skin becomes reddened/irritated stop using the CHG and inform your nurse when you arrive at Short Stay. Do not shave (including legs and  underarms) for at least 48 hours prior to the first CHG shower.  You may shave your face/neck. Please follow these instructions carefully:  1.  Shower with CHG Soap the night before surgery and the  morning of Surgery.  2.  If you choose to wash your hair, wash your hair first as usual with your  normal  shampoo.  3.  After you shampoo, rinse your hair and body thoroughly to remove the  shampoo.                           4.  Use CHG as you would any other liquid soap.  You can apply chg directly  to the skin and wash                       Gently with a scrungie or clean washcloth.  5.  Apply the CHG Soap to your body ONLY FROM THE NECK DOWN.   Do not use on face/ open                           Wound or open sores. Avoid contact with eyes, ears mouth and genitals (private parts).                       Wash face,  Genitals (private parts) with your normal soap.             6.  Wash thoroughly, paying special attention to the area where your surgery  will be performed.  7.  Thoroughly rinse your body with warm water from the neck down.  8.  DO NOT shower/wash with your normal soap after using and rinsing off  the CHG Soap.                9.  Pat yourself dry with a clean towel.            10.  Wear clean pajamas.            11.  Place clean sheets on your bed the night of your first shower and do not  sleep with pets. Day of Surgery : Do not apply any lotions/deodorants the morning of surgery.  Please wear clean clothes to the hospital/surgery center.  FAILURE TO FOLLOW THESE INSTRUCTIONS MAY RESULT IN THE CANCELLATION OF YOUR SURGERY PATIENT SIGNATURE_________________________________  NURSE SIGNATURE__________________________________  ________________________________________________________________________

## 2017-02-12 NOTE — Progress Notes (Signed)
EKG 08-09-16 epic CXR 02-02-17 epic MRSA by PCR negative 02-02-17 epic CBC,BMP 02-06-17 epic PT-INR/aPTT 02-02-17 epic

## 2017-02-12 NOTE — Progress Notes (Signed)
Pre visit review using our clinic review tool, if applicable. No additional management support is needed unless otherwise documented below in the visit note. 

## 2017-02-12 NOTE — Telephone Encounter (Signed)
Pt was referred to Dr. Rosendo Gros for her external hemorrhoids. States he did banding of some but told her "she wouldn't like him if he did the external ones." Pt is having issues now with a kidney stone and wants to cancel the appt with Dr. Rosendo Gros for now. She will call back when she is ready to reschedule the appt. She would like to see Dr. Marcello Moores when she is ready to reschedule. Appt cancelled for pt.

## 2017-02-13 ENCOUNTER — Encounter (HOSPITAL_COMMUNITY): Payer: Self-pay

## 2017-02-13 ENCOUNTER — Encounter: Payer: Self-pay | Admitting: Family Medicine

## 2017-02-13 ENCOUNTER — Encounter (HOSPITAL_COMMUNITY)
Admission: RE | Admit: 2017-02-13 | Discharge: 2017-02-13 | Disposition: A | Discharge status: Home / Self Care | Payer: 59 | Source: Ambulatory Visit | Attending: Urology | Admitting: Urology

## 2017-02-13 DIAGNOSIS — Z79899 Other long term (current) drug therapy: Secondary | ICD-10-CM | POA: Diagnosis not present

## 2017-02-13 DIAGNOSIS — M5432 Sciatica, left side: Secondary | ICD-10-CM | POA: Insufficient documentation

## 2017-02-13 DIAGNOSIS — M5136 Other intervertebral disc degeneration, lumbar region: Secondary | ICD-10-CM | POA: Insufficient documentation

## 2017-02-13 DIAGNOSIS — K219 Gastro-esophageal reflux disease without esophagitis: Secondary | ICD-10-CM | POA: Diagnosis not present

## 2017-02-13 DIAGNOSIS — N319 Neuromuscular dysfunction of bladder, unspecified: Secondary | ICD-10-CM | POA: Insufficient documentation

## 2017-02-13 DIAGNOSIS — K589 Irritable bowel syndrome without diarrhea: Secondary | ICD-10-CM

## 2017-02-13 DIAGNOSIS — Z87442 Personal history of urinary calculi: Secondary | ICD-10-CM

## 2017-02-13 DIAGNOSIS — Z87891 Personal history of nicotine dependence: Secondary | ICD-10-CM | POA: Diagnosis not present

## 2017-02-13 DIAGNOSIS — E785 Hyperlipidemia, unspecified: Secondary | ICD-10-CM

## 2017-02-13 DIAGNOSIS — M1990 Unspecified osteoarthritis, unspecified site: Secondary | ICD-10-CM

## 2017-02-13 DIAGNOSIS — J309 Allergic rhinitis, unspecified: Secondary | ICD-10-CM

## 2017-02-13 DIAGNOSIS — Z01812 Encounter for preprocedural laboratory examination: Secondary | ICD-10-CM

## 2017-02-13 DIAGNOSIS — N3281 Overactive bladder: Secondary | ICD-10-CM | POA: Insufficient documentation

## 2017-02-13 DIAGNOSIS — N136 Pyonephrosis: Secondary | ICD-10-CM | POA: Diagnosis not present

## 2017-02-13 DIAGNOSIS — G8381 Brown-Sequard syndrome: Secondary | ICD-10-CM | POA: Insufficient documentation

## 2017-02-13 DIAGNOSIS — G43009 Migraine without aura, not intractable, without status migrainosus: Secondary | ICD-10-CM | POA: Insufficient documentation

## 2017-02-13 DIAGNOSIS — K5732 Diverticulitis of large intestine without perforation or abscess without bleeding: Secondary | ICD-10-CM

## 2017-02-13 DIAGNOSIS — B977 Papillomavirus as the cause of diseases classified elsewhere: Secondary | ICD-10-CM | POA: Insufficient documentation

## 2017-02-13 DIAGNOSIS — Z791 Long term (current) use of non-steroidal anti-inflammatories (NSAID): Secondary | ICD-10-CM | POA: Diagnosis not present

## 2017-02-13 DIAGNOSIS — N201 Calculus of ureter: Secondary | ICD-10-CM | POA: Diagnosis present

## 2017-02-13 DIAGNOSIS — A419 Sepsis, unspecified organism: Secondary | ICD-10-CM | POA: Insufficient documentation

## 2017-02-13 HISTORY — DX: Sepsis, unspecified organism: A41.9

## 2017-02-13 LAB — BASIC METABOLIC PANEL
Anion gap: 10 (ref 5–15)
BUN: 15 mg/dL (ref 6–20)
CO2: 26 mmol/L (ref 22–32)
Calcium: 9 mg/dL (ref 8.9–10.3)
Chloride: 99 mmol/L — ABNORMAL LOW (ref 101–111)
Creatinine, Ser: 0.85 mg/dL (ref 0.44–1.00)
GFR calc Af Amer: >60 mL/min (ref >60)
GFR calc non Af Amer: >60 mL/min (ref >60)
Glucose, Bld: 176 mg/dL — ABNORMAL HIGH (ref 65–99)
Potassium: 3.6 mmol/L (ref 3.5–5.1)
Sodium: 135 mmol/L (ref 135–145)

## 2017-02-13 LAB — CBC
HCT: 38.9 % (ref 36.0–46.0)
Hemoglobin: 11.9 g/dL — ABNORMAL LOW (ref 12.0–15.0)
MCH: 22.7 pg — ABNORMAL LOW (ref 26.0–34.0)
MCHC: 30.6 g/dL (ref 30.0–36.0)
MCV: 74.1 fL — ABNORMAL LOW (ref 78.0–100.0)
Platelets: 357 10*3/uL (ref 150–400)
RBC: 5.25 MIL/uL — ABNORMAL HIGH (ref 3.87–5.11)
RDW: 30.3 % — ABNORMAL HIGH (ref 11.5–15.5)
WBC: 5.6 10*3/uL (ref 4.0–10.5)

## 2017-02-13 NOTE — Progress Notes (Signed)
   Subjective:    Patient ID: Victoria Mosley, female    DOB: 1951/08/09, 66 y.o.   MRN: [id number]  HPI Here to follow up a hospital stay from 02-02-17 to 02-06-17 for urosepsis. She developed a UTI secondary to a stone blocking the right ureter. This caused a hydroureter and hydronephrosis, and Urology was called to place a stent in the ureter. This did relieve the back pressure but the stone was left in place at that time because thye wanted to get the infection under control first. She was septic and required lots of IV fluids to maintain her BP. Her renal function was preserved and her creatinines remained WNL. She was quite anemic however and her admission Hgb was 8.1. After receiving some PRBC this went up to 9.4 by the time of DC. She has also been receiving iron infusions. Her potassium was low at 3.2 and this was supplemented. Today she feels much better with no pain although she still feels a little weak. Her appetite is returning. No fevers.    Review of Systems  Constitutional: Positive for fatigue. Negative for chills, diaphoresis and fever.  Respiratory: Negative.   Cardiovascular: Negative.   Gastrointestinal: Negative.   Genitourinary: Negative.   Neurological: Negative.        Objective:   Physical Exam  Constitutional: She is oriented to person, place, and time. She appears well-developed and well-nourished. No distress.  Cardiovascular: Normal rate, regular rhythm, normal heart sounds and intact distal pulses.   Pulmonary/Chest: Effort normal and breath sounds normal.  Abdominal: Soft. Bowel sounds are normal. She exhibits no distension and no mass. There is no tenderness. There is no guarding.  Neurological: She is alert and oriented to person, place, and time.          Assessment & Plan:  She is recovering from sepsis resulting from a UTI and a right ureteral stone. She is drinking plenty of water. She is scheduled with Dr. Louis Meckel on 02-15-17 to remove the right  ureteral stent and to remove the stone as well. We will check her potassium and Hgb today.  Alysia Penna, MD

## 2017-02-14 LAB — HEMOGLOBIN A1C
Hgb A1c MFr Bld: 5.3 % (ref 4.8–5.6)
Mean Plasma Glucose: 105 mg/dL

## 2017-02-15 ENCOUNTER — Ambulatory Visit (HOSPITAL_COMMUNITY)
Admission: RE | Admit: 2017-02-15 | Discharge: 2017-02-15 | Disposition: A | Discharge status: Home / Self Care | Payer: 59 | Source: Ambulatory Visit | Attending: Urology | Admitting: Urology

## 2017-02-15 ENCOUNTER — Ambulatory Visit (HOSPITAL_COMMUNITY): Payer: 59

## 2017-02-15 ENCOUNTER — Encounter (HOSPITAL_COMMUNITY): Payer: Self-pay

## 2017-02-15 ENCOUNTER — Ambulatory Visit (HOSPITAL_COMMUNITY): Payer: 59 | Admitting: Anesthesiology

## 2017-02-15 ENCOUNTER — Encounter (HOSPITAL_COMMUNITY)
Admission: RE | Disposition: A | Discharge status: Home / Self Care | Payer: Self-pay | Source: Ambulatory Visit | Attending: Urology

## 2017-02-15 DIAGNOSIS — Z87891 Personal history of nicotine dependence: Secondary | ICD-10-CM | POA: Insufficient documentation

## 2017-02-15 DIAGNOSIS — N136 Pyonephrosis: Secondary | ICD-10-CM | POA: Diagnosis not present

## 2017-02-15 DIAGNOSIS — K219 Gastro-esophageal reflux disease without esophagitis: Secondary | ICD-10-CM | POA: Insufficient documentation

## 2017-02-15 DIAGNOSIS — Z791 Long term (current) use of non-steroidal anti-inflammatories (NSAID): Secondary | ICD-10-CM | POA: Insufficient documentation

## 2017-02-15 DIAGNOSIS — Z79899 Other long term (current) drug therapy: Secondary | ICD-10-CM | POA: Insufficient documentation

## 2017-02-15 HISTORY — PX: CYSTOSCOPY/URETEROSCOPY/HOLMIUM LASER/STENT PLACEMENT: SHX6546

## 2017-02-15 SURGERY — CYSTOSCOPY/URETEROSCOPY/HOLMIUM LASER/STENT PLACEMENT
Anesthesia: General | Laterality: Right

## 2017-02-15 MED ORDER — KETOROLAC TROMETHAMINE 30 MG/ML IJ SOLN
INTRAMUSCULAR | Status: AC
  Filled 2017-02-15: qty 1

## 2017-02-15 MED ORDER — DEXAMETHASONE SODIUM PHOSPHATE 10 MG/ML IJ SOLN
INTRAMUSCULAR | Status: DC | PRN
  Administered 2017-02-15: 10 mg via INTRAVENOUS

## 2017-02-15 MED ORDER — FENTANYL CITRATE (PF) 100 MCG/2ML IJ SOLN
INTRAMUSCULAR | Status: DC | PRN
  Administered 2017-02-15 (×2): 50 ug via INTRAVENOUS

## 2017-02-15 MED ORDER — PROPOFOL 10 MG/ML IV BOLUS
INTRAVENOUS | Status: AC
  Filled 2017-02-15: qty 20

## 2017-02-15 MED ORDER — CEFAZOLIN SODIUM-DEXTROSE 2-4 GM/100ML-% IV SOLN
INTRAVENOUS | Status: AC
  Filled 2017-02-15: qty 100

## 2017-02-15 MED ORDER — ONDANSETRON HCL 4 MG/2ML IJ SOLN
INTRAMUSCULAR | Status: DC | PRN
  Administered 2017-02-15: 4 mg via INTRAVENOUS

## 2017-02-15 MED ORDER — MIDAZOLAM HCL 5 MG/5ML IJ SOLN
INTRAMUSCULAR | Status: DC | PRN
  Administered 2017-02-15: 2 mg via INTRAVENOUS

## 2017-02-15 MED ORDER — LIDOCAINE 2% (20 MG/ML) 5 ML SYRINGE
INTRAMUSCULAR | Status: AC
  Filled 2017-02-15: qty 5

## 2017-02-15 MED ORDER — SCOPOLAMINE 1 MG/3DAYS TD PT72
MEDICATED_PATCH | TRANSDERMAL | Status: DC | PRN
  Administered 2017-02-15: 1 via TRANSDERMAL

## 2017-02-15 MED ORDER — LIDOCAINE HCL (CARDIAC) 20 MG/ML IV SOLN
INTRAVENOUS | Status: DC | PRN
  Administered 2017-02-15: 100 mg via INTRATRACHEAL

## 2017-02-15 MED ORDER — KETOROLAC TROMETHAMINE 30 MG/ML IJ SOLN
INTRAMUSCULAR | Status: DC | PRN
  Administered 2017-02-15: 30 mg via INTRAVENOUS

## 2017-02-15 MED ORDER — PROPOFOL 10 MG/ML IV BOLUS
INTRAVENOUS | Status: AC
  Filled 2017-02-15: qty 60

## 2017-02-15 MED ORDER — TRAMADOL HCL 50 MG PO TABS: 50.0000 mg | ORAL_TABLET | Freq: Four times a day (QID) | ORAL | 0 refills | Status: DC | PRN

## 2017-02-15 MED ORDER — MIDAZOLAM HCL 2 MG/2ML IJ SOLN
INTRAMUSCULAR | Status: AC
  Filled 2017-02-15: qty 2

## 2017-02-15 MED ORDER — PROPOFOL 10 MG/ML IV BOLUS
INTRAVENOUS | Status: DC | PRN
  Administered 2017-02-15: 170 mg via INTRAVENOUS
  Administered 2017-02-15: 30 mg via INTRAVENOUS

## 2017-02-15 MED ORDER — SODIUM CHLORIDE 0.9 % IR SOLN
Status: DC | PRN
  Administered 2017-02-15: 4000 mL via INTRAVESICAL

## 2017-02-15 MED ORDER — IOHEXOL 300 MG/ML  SOLN
INTRAMUSCULAR | Status: DC | PRN
  Administered 2017-02-15: 8 mL via URETHRAL

## 2017-02-15 MED ORDER — CEFPODOXIME PROXETIL 200 MG PO TABS: 200.0000 mg | ORAL_TABLET | Freq: Two times a day (BID) | ORAL | 0 refills | Status: AC

## 2017-02-15 MED ORDER — PROMETHAZINE HCL 25 MG/ML IJ SOLN: 6.2500 mg | INTRAMUSCULAR | Status: DC | PRN

## 2017-02-15 MED ORDER — FENTANYL CITRATE (PF) 100 MCG/2ML IJ SOLN
INTRAMUSCULAR | Status: AC
  Filled 2017-02-15: qty 2

## 2017-02-15 MED ORDER — SCOPOLAMINE 1 MG/3DAYS TD PT72
MEDICATED_PATCH | TRANSDERMAL | Status: AC
  Filled 2017-02-15: qty 1

## 2017-02-15 MED ORDER — LACTATED RINGERS IV SOLN
INTRAVENOUS | Status: DC | PRN
  Administered 2017-02-15: 07:00:00 via INTRAVENOUS

## 2017-02-15 MED ORDER — HYDROMORPHONE HCL 2 MG/ML IJ SOLN: 0.2500 mg | INTRAMUSCULAR | Status: DC | PRN

## 2017-02-15 MED ORDER — ONDANSETRON HCL 4 MG/2ML IJ SOLN
INTRAMUSCULAR | Status: AC
  Filled 2017-02-15: qty 2

## 2017-02-15 MED ORDER — PROPOFOL 500 MG/50ML IV EMUL
INTRAVENOUS | Status: DC | PRN
  Administered 2017-02-15: 200 ug/kg/min via INTRAVENOUS

## 2017-02-15 MED ORDER — DEXAMETHASONE SODIUM PHOSPHATE 10 MG/ML IJ SOLN
INTRAMUSCULAR | Status: AC
  Filled 2017-02-15: qty 1

## 2017-02-15 MED ORDER — CEFAZOLIN SODIUM-DEXTROSE 2-4 GM/100ML-% IV SOLN
2.0000 g | INTRAVENOUS | Status: AC
  Administered 2017-02-15: 2 g via INTRAVENOUS

## 2017-02-15 SURGICAL SUPPLY — 21 items
BAG URO CATCHER STRL LF (MISCELLANEOUS) ×2 IMPLANT
BASKET DAKOTA 1.9FR 11X120 (BASKET) IMPLANT
BASKET ZERO TIP NITINOL 2.4FR (BASKET) IMPLANT
CATH URET 5FR 28IN OPEN ENDED (CATHETERS) ×4 IMPLANT
CLOTH BEACON ORANGE TIMEOUT ST (SAFETY) ×2 IMPLANT
COVER SURGICAL LIGHT HANDLE (MISCELLANEOUS) ×2 IMPLANT
EXTRACTOR STONE NITINOL NGAGE (UROLOGICAL SUPPLIES) ×2 IMPLANT
FIBER LASER FLEXIVA 365 (UROLOGICAL SUPPLIES) ×2 IMPLANT
GLOVE BIOGEL M STRL SZ7.5 (GLOVE) ×2 IMPLANT
GOWN STRL REUS W/TWL XL LVL3 (GOWN DISPOSABLE) ×2 IMPLANT
GUIDEWIRE ANG ZIPWIRE 038X150 (WIRE) ×2 IMPLANT
GUIDEWIRE STR DUAL SENSOR (WIRE) ×2 IMPLANT
MANIFOLD NEPTUNE II (INSTRUMENTS) ×2 IMPLANT
NS IRRIG 1000ML POUR BTL (IV SOLUTION) ×2 IMPLANT
PACK CYSTO (CUSTOM PROCEDURE TRAY) ×2 IMPLANT
SHEATH ACCESS URETERAL 24CM (SHEATH) IMPLANT
SHEATH ACCESS URETERAL 38CM (SHEATH) IMPLANT
SHEATH ACCESS URETERAL 54CM (SHEATH) IMPLANT
STENT POLARIS 5FRX26 (STENTS) ×2 IMPLANT
TUBING CONNECTING 10 (TUBING) ×2 IMPLANT
WIRE COONS/BENSON .038X145CM (WIRE) IMPLANT

## 2017-02-15 NOTE — Discharge Instructions (Signed)
General Anesthesia, Adult, Care After These instructions provide you with information about caring for yourself after your procedure. Your health care provider may also give you more specific instructions. Your treatment has been planned according to current medical practices, but problems sometimes occur. Call your health care provider if you have any problems or questions after your procedure. What can I expect after the procedure? After the procedure, it is common to have:  Vomiting.  A sore throat.  Mental slowness. It is common to feel:  Nauseous.  Cold or shivery.  Sleepy.  Tired.  Sore or achy, even in parts of your body where you did not have surgery. Follow these instructions at home: For at least 24 hours after the procedure:   Do not:  Participate in activities where you could fall or become injured.  Drive.  Use heavy machinery.  Drink alcohol.  Take sleeping pills or medicines that cause drowsiness.  Make important decisions or sign legal documents.  Take care of children on your own.  Rest. Eating and drinking   If you vomit, drink water, juice, or soup when you can drink without vomiting.  Drink enough fluid to keep your urine clear or pale yellow.  Make sure you have little or no nausea before eating solid foods.  Follow the diet recommended by your health care provider. General instructions   Have a responsible adult stay with you until you are awake and alert.  Return to your normal activities as told by your health care provider. Ask your health care provider what activities are safe for you.  Take over-the-counter and prescription medicines only as told by your health care provider.  If you smoke, do not smoke without supervision.  Keep all follow-up visits as told by your health care provider. This is important. Contact a health care provider if:  You continue to have nausea or vomiting at home, and medicines are not helpful.  You  cannot drink fluids or start eating again.  You cannot urinate after 8-12 hours.  You develop a skin rash.  You have fever.  You have increasing redness at the site of your procedure. Get help right away if:  You have difficulty breathing.  You have chest pain.  You have unexpected bleeding.  You feel that you are having a life-threatening or urgent problem. This information is not intended to replace advice given to you by your health care provider. Make sure you discuss any questions you have with your health care provider. Document Released: 01/22/2001 Document Revised: 03/20/2016 Document Reviewed: 09/30/2015 Elsevier Interactive Patient Education  2017 [place] INSTRUCTIONS FOR KIDNEY STONE/URETERAL STENT   MEDICATIONS:  1.  Resume all your other meds from home - except do not take any extra narcotic pain meds that you may have at home.  2. Tramadol is for moderate/severe pain, otherwise taking upto 1000 mg every 6 hours of plainTylenol will help treat your pain.   3. Take the last antibiotic tablet one hour prior to removal of your stent.   ACTIVITY:  1. No strenuous activity x 1week  2. No driving while on narcotic pain medications  3. Drink plenty of water  4. Continue to walk at home - you can still get blood clots when you are at home, so keep active, but don't over do it.  5. May return to work/school tomorrow or when you feel ready   BATHING:  1. You can shower and we recommend  daily showers  2. You have a string coming from your urethra: The stent string is attached to your ureteral stent. Do not pull on this.   SIGNS/SYMPTOMS TO CALL:  Please call us if you have a fever greater than 101.5, uncontrolled nausea/vomiting, uncontrolled pain, dizziness, unable to urinate, bloody urine, chest pain, shortness of breath, leg swelling, leg pain, redness around wound, drainage from wound, or any other concerns or questions.   You can reach [place] at  [phone].   FOLLOW-UP:  1. You have an appointment in 6 weeks with a ultrasound of your kidneys prior.   2. You have a string attached to your stent, you may remove it on April 23rd. To do this, pull the strings until the stents are completely removed. You may feel an odd sensation in your back.

## 2017-02-15 NOTE — Anesthesia Postprocedure Evaluation (Addendum)
Anesthesia Post Note  Patient: Victoria Mosley  Procedure(s) Performed: Procedure(s) (LRB): CYSTOSCOPY/URETEROSCOPY/HOLMIUM LASER/STENT EXCHANGE (Right)  Patient location during evaluation: PACU Anesthesia Type: General Level of consciousness: sedated Pain management: pain level controlled Vital Signs Assessment: post-procedure vital signs reviewed and stable Respiratory status: spontaneous breathing and respiratory function stable Cardiovascular status: stable Anesthetic complications: no       Last Vitals:  Vitals:   02/15/17 0959 02/15/17 1030  BP: 126/66 121/75  Pulse: 67 67  Resp: 14 16  Temp: 37.1 C 37.1 C    Last Pain:  Vitals:   02/15/17 1030  TempSrc: Oral  PainSc: 1                  [name] DANIEL

## 2017-02-15 NOTE — Anesthesia Procedure Notes (Signed)
Procedure Name: LMA Insertion Date/Time: 02/15/2017 7:38 AM Performed by: Danley Danker L Patient Re-evaluated:Patient Re-evaluated prior to inductionOxygen Delivery Method: Circle system utilized Preoxygenation: Pre-oxygenation with 100% oxygen Intubation Type: IV induction LMA: LMA inserted LMA Size: 4.0 Number of attempts: 1 Placement Confirmation: positive ETCO2 and breath sounds checked- equal and bilateral Tube secured with: Tape Dental Injury: Teeth and Oropharynx as per pre-operative assessment

## 2017-02-15 NOTE — Op Note (Signed)
Preoperative diagnosis:  1. Right ureteral stone   Postoperative diagnosis:  1. Same   Procedure: 1. Right ureteroscopy, laser lithotripsy and stone removal 2. Right retrograde pyelogram with interpretation 3. Right ureteral stent exchange  Surgeon: Ardis Hughs, MD  Anesthesia: General  Complications: None  Intraoperative findings: The retrograde pyelogram demonstrated mild hydroureteronephrosis and a distal filling defect consistent with the patient's known stone. The calyces were otherwise sharp but no additional filling defects.  EBL: Minimal  Specimens: Right ureteral stone - given to the patient  Indication: Victoria Mosley is a 66 y.o. patient with right infected ureteral stone that was subsequently stented on 02/02/17. She was then treated with antibiotics and defervesced and is now feeling better.  After reviewing the management options for treatment, she elected to proceed with the above surgical procedure(s). We have discussed the potential benefits and risks of the procedure, side effects of the proposed treatment, the likelihood of the patient achieving the goals of the procedure, and any potential problems that might occur during the procedure or recuperation. Informed consent has been obtained.  Description of procedure:  The patient was taken to the operating room and general anesthesia was induced.  The patient was placed in the dorsal lithotomy position, prepped and draped in the usual sterile fashion, and preoperative antibiotics were administered. A preoperative time-out was performed.   The 30 21 French cystoscope which only pass to the patient's urethra and into the bladder under visual conditions. Attempts to decrease his Evaluation was then performed with no significant abnormalities. The ureters were orthotopic and there was a stent emanating from the patient's right ureteral orifice. The stent was then grasped with a single referable to the urethral  meatus. I then advanced a 0.038 sensor wire up the stent and into the right renal pelvis removed the stent over the wire.  A 5 French open-ended ureteral catheter was then used and exchanged for the wire performing retrograde pyelogram with the above findings. I then repassed the wire removed catheter over the wire. I then advanced a 4/6 French semirigid ureteroscope into the patient's urethra and cannulated the right ureteral orifice under visual guidance. I encountered the stone distal ureter and attempted to remove it with the engage basket without laser fragmentation unsuccessfully. At this point I advanced a 360 nm holmium laser fiber through the scope and targeted the stone with settings of point 6 J and 6 Hz. The stone was fragmented into several smaller pieces. I then was able to remove the stent without significant difficulty.  I then advanced the semirigid ureteroscope up to the renal pelvis with no additional stones or abnormalities noted. The scope was then removed. I then repassed the cystoscope into the patient's bladder and removed the stone fragments. I then backloaded a 5 [place] Polaris stent over the wire placing it into the patient's right renal pelvis under fluoroscopic guidance. Once that was noted to be in appropriate position the stent was slowly backed out advancing the stent to the patient's right ureteral orifice. The wire was then completely removed. The   Be stent tether was then tucked inside the patient's vagina. She subsequent x-ray returned to PACU stable condition.  Ardis Hughs, M.D.

## 2017-02-15 NOTE — Interval H&P Note (Signed)
History and Physical Interval Note:  02/15/2017 5:46 AM  Victoria Mosley  has presented today for surgery, with the diagnosis of RIGHT DISTAL STONE  The various methods of treatment have been discussed with the patient and family. After consideration of risks, benefits and other options for treatment, the patient has consented to  Procedure(s): CYSTOSCOPY/URETEROSCOPY/HOLMIUM LASER/STENT EXCHANGE (Right) as a surgical intervention .  The patient's history has been reviewed, patient examined, no change in status, stable for surgery.  I have reviewed the patient's chart and labs.  Questions were answered to the patient's satisfaction.     Louis Meckel W

## 2017-02-15 NOTE — Anesthesia Preprocedure Evaluation (Signed)
Anesthesia Evaluation  Patient identified by MRN, date of birth, ID band Patient awake    Reviewed: Allergy & Precautions, NPO status , Unable to perform ROS - Chart review only  History of Anesthesia Complications (+) PONVNegative for: history of anesthetic complications  Airway Mallampati: II  TM Distance: >3 FB Neck ROM: Full    Dental no notable dental hx. (+) Dental Advisory Given   Pulmonary former smoker,    Pulmonary exam normal        Cardiovascular negative cardio ROS Normal cardiovascular exam     Neuro/Psych negative psych ROS   GI/Hepatic Neg liver ROS, GERD  ,  Endo/Other  negative endocrine ROS  Renal/GU      Musculoskeletal   Abdominal   Peds  Hematology   Anesthesia Other Findings   Reproductive/Obstetrics                             Anesthesia Physical Anesthesia Plan  ASA: II  Anesthesia Plan: General   Post-op Pain Management:    Induction: Intravenous  Airway Management Planned: LMA  Additional Equipment:   Intra-op Plan:   Post-operative Plan: Extubation in OR  Informed Consent: I have reviewed the patients History and Physical, chart, labs and discussed the procedure including the risks, benefits and alternatives for the proposed anesthesia with the patient or authorized representative who has indicated his/her understanding and acceptance.   Dental advisory given  Plan Discussed with: CRNA, Anesthesiologist and Surgeon  Anesthesia Plan Comments:         Anesthesia Quick Evaluation

## 2017-02-15 NOTE — Transfer of Care (Signed)
Immediate Anesthesia Transfer of Care Note  Patient: Victoria Mosley  Procedure(s) Performed: Procedure(s): CYSTOSCOPY/URETEROSCOPY/HOLMIUM LASER/STENT EXCHANGE (Right)  Patient Location: PACU  Anesthesia Type:General  Level of Consciousness: awake  Airway & Oxygen Therapy: Patient Spontanous Breathing and Patient connected to face mask oxygen  Post-op Assessment: Report given to RN and Post -op Vital signs reviewed and stable  Post vital signs: Reviewed and stable  Last Vitals:  Vitals:   02/15/17 0545  BP: 119/67  Pulse: 70  Resp: (!) 70  Temp: 36.9 C    Last Pain:  Vitals:   02/15/17 0625  TempSrc:   PainSc: 2       Patients Stated Pain Goal: 2 (01/17/21 4825)  Complications: No apparent anesthesia complications

## 2017-02-15 NOTE — H&P (View-Only) (Signed)
Urology Consult Note   Requesting Attending Physician:  Florencia Reasons, MD Service Providing Consult: Urology Consulting Attending: Jeffie Pollock, MD  Assessment:  Patient is a 66 y.o. female with history of nephrolithiasis, recurrent UTI, neurogenic bladder who presented 4/6 with at least 36mm right distal ureteral calculus, infected appearing urinalysis, hypotension with right perinephric stranding, collecting system enhancement suggestive of right ascending urinary tract infection, and mild hydronephrosis.  She is now s/p right ureteral stent placement on 02/02/17 with Dr. Jeffie Pollock.  Interval: In stepdown unit overnight. No pressor requirement currently but BP remains borderline. Afebrile. HR normal. 106/59 BP this morning but as low as 86/48 overnight. [place] via Foley. Cr 0.67. Lactic acid 2.1. WBC 14.5. Blood cultures positive for GNRs. Urine cultures pending. Multiple boluses required for BP support.   Recommendations: - Continue Foley catheter today - Continue CTX and follow up blood & urine cultures  - PRN flomax 0.4mg  qd stent discomfort if she can tolerate it from BP standpoint, ditropan 5mg  TID PRN bladder spasms, pyridium PRN - Definitive stone management will be deferred for at least 1-2 weeks - Recommend overall antibiotic treatment course of 12-87O based on complicated UTI  Thank you for this consult. Please contact the  urology consult pager with any further questions/concerns. Sharmaine Base, MD Urology Surgical Resident  Subjective -  Still in right lower quadrant pain but better than yesterday.   Objective   Vital signs in last 24 hours: BP (!) 104/33   Pulse 83   Temp 98.1 F (36.7 C) (Oral)   Resp 17   Ht 5\' 7"  (1.702 m)   Wt 87.5 kg (192 lb 14.4 oz)   SpO2 93%   BMI 30.21 kg/m   Intake/Output last 3 shifts: I/O last 3 completed shifts: In: 3685 [P.O.:360; I.V.:1525; IV Piggyback:1800] Out: 27 [Urine:675]  Physical Exam General: NAD, A&O HEENT: [place]/AT, EOMI,  MMM Pulmonary: Normal work of breathing on RA Cardiovascular: Regular rate & rhythm, HDS, adequate peripheral perfusion Abdomen: soft, improved TTP in RLQ, no rebound tenderness, nondistended, no suprapubic fullness or tenderness; warm to touch GU: Foley draining orange urine Extremities: warm and well perfused, no edema  Most Recent Labs: Lab Results  Component Value Date   WBC 14.5 (H) 02/03/2017   HGB 8.8 (L) 02/03/2017   HCT 29.6 (L) 02/03/2017   PLT 182 02/03/2017    Lab Results  Component Value Date   NA 141 02/03/2017   K 3.3 (L) 02/03/2017   CL 110 02/03/2017   CO2 22 02/03/2017   BUN 14 02/03/2017   CREATININE 0.67 02/03/2017   CALCIUM 8.1 (L) 02/03/2017    Lab Results  Component Value Date   ALKPHOS 40 02/03/2017   BILITOT 0.8 02/03/2017   BILIDIR 0.1 12/29/2016   PROT 5.5 (L) 02/03/2017   ALBUMIN 2.7 (L) 02/03/2017   ALT 22 02/03/2017   AST 30 02/03/2017    Lab Results  Component Value Date   INR 1.13 02/02/2017   APTT 36 02/02/2017     Urine Culture: Pending   IMAGING: Ct Abdomen Pelvis W Contrast  Result Date: 02/02/2017 CLINICAL DATA:  Right lower quadrant abdominal pain. Upper and lower endoscopy a few days prior. EXAM: CT ABDOMEN AND PELVIS WITH CONTRAST TECHNIQUE: Multidetector CT imaging of the abdomen and pelvis was performed using the standard protocol following bolus administration of intravenous contrast. CONTRAST:  134mL ISOVUE-300 IOPAMIDOL (ISOVUE-300) INJECTION 61% COMPARISON:  08/28/2012 CT abdomen/ pelvis. FINDINGS: Lower chest: Mild bibasilar scarring versus atelectasis. Hepatobiliary:  Diffuse hepatic steatosis. Simple 1.0 cm lateral segment left liver lobe cyst. No additional liver lesions. Normal gallbladder with no radiopaque cholelithiasis. No biliary ductal dilatation. Pancreas: Normal, with no mass or duct dilation. Spleen: Normal size. No mass. Adrenals/Urinary Tract: Normal adrenals. Obstructing 7 x 5 mm stone in the distal right  pelvic ureter with mild right hydroureteronephrosis and asymmetric right perinephric fat stranding. Slight urothelial wall thickening throughout the right renal collecting system and right ureter. No left hydronephrosis. subcentimeter hypodense renal cortical lesions in both kidneys are too small to characterize and require no further follow-up. Limited bladder visualization due to streak artifact, with no gross bladder abnormality. Stomach/Bowel: Grossly normal stomach. Normal caliber small bowel with no small bowel wall thickening. Normal appendix. Mild sigmoid diverticulosis, with no evidence of large bowel wall thickening or pericolonic fat stranding. Vascular/Lymphatic: Atherosclerotic nonaneurysmal abdominal aorta. Patent portal, splenic, hepatic and renal veins. No pathologically enlarged lymph nodes in the abdomen or pelvis. Reproductive: Grossly stable enlarged myomatous uterus with dominant 4.8 cm left uterine fibroid. No adnexal mass. Other: No pneumoperitoneum, ascites or focal fluid collection. Small fat containing umbilical hernia. Musculoskeletal: No aggressive appearing focal osseous lesions. Partially visualized bilateral total hip arthroplasty. Marked degenerative disc disease at L5-S1. IMPRESSION: 1. Obstructing 7 x 5 mm distal right pelvic ureteral stone with mild right hydroureteronephrosis. Minimal urothelial wall thickening throughout the right urinary tract is probably due to the acute obstruction, although a superimposed ascending right urinary tract infection is difficult to exclude. 2. Aortic atherosclerosis . 3. Diffuse hepatic steatosis. 4. Mild sigmoid diverticulosis, with no evidence of acute diverticulitis. 5. Enlarged myomatous uterus. 6. Small fat containing umbilical hernia. Electronically Signed   By: Ilona Sorrel M.D.   On: 02/02/2017 11:24   Dg Retrograde Pyelogram  Result Date: 02/03/2017 CLINICAL DATA:  66 year old female with a history of nephrolithiasis EXAM: RETROGRADE  PYELOGRAM COMPARISON:  CT 02/02/2017 FINDINGS: Fluoroscopy: 9 minutes 48 seconds Limited intraoperative fluoroscopic spot images during retrograde pyelogram demonstrate placement of a right ureteral stent. No opacification of the collecting system or ureter. IMPRESSION: Limited images of retrograde pyelogram demonstrate placement of a right ureteral stent. Please refer to the dictated operative report for full details of intraoperative findings and procedure. Electronically Signed   By: Corrie Mckusick D.O.   On: 02/03/2017 08:51   Dg Chest Port 1 View  Result Date: 02/02/2017 CLINICAL DATA:  Sepsis and fever, known RIGHT nephrolithiasis. EXAM: PORTABLE CHEST 1 VIEW COMPARISON:  Chest radiograph November 30, 2014 FINDINGS: Elevated RIGHT hemidiaphragm. Strandy densities LEFT lung base, to lesser extent on the RIGHT. No pleural effusion. No pneumothorax. Cardiac silhouette is upper limits of normal in size, mildly calcified aortic knob. No pneumothorax. Soft tissue planes and included osseous structures are normal. IMPRESSION: Bibasilar atelectasis/scarring. Borderline cardiomegaly. Electronically Signed   By: Elon Alas M.D.   On: 02/02/2017 22:11    I have discussed her case with Dr. Laneta Simmers and Dr. Burnett Harry and reviewed her pertinent labs and films.  I have reviewed the history and examined Mrs. Kowalski.  She has probable infection with hypotension with the right distal stone and we will proceed with stent insertion.

## 2017-03-01 ENCOUNTER — Other Ambulatory Visit (INDEPENDENT_AMBULATORY_CARE_PROVIDER_SITE_OTHER): Payer: 59

## 2017-03-01 DIAGNOSIS — D509 Iron deficiency anemia, unspecified: Secondary | ICD-10-CM | POA: Diagnosis not present

## 2017-03-01 LAB — CBC WITH DIFFERENTIAL/PLATELET
Basophils Absolute: 0 10*3/uL (ref 0.0–0.1)
Basophils Relative: 0.6 % (ref 0.0–3.0)
Eosinophils Absolute: 0.1 10*3/uL (ref 0.0–0.7)
Eosinophils Relative: 2 % (ref 0.0–5.0)
HCT: 41.3 % (ref 36.0–46.0)
Hemoglobin: 13.3 g/dL (ref 12.0–15.0)
Lymphocytes Relative: 38.2 % (ref 12.0–46.0)
Lymphs Abs: 2.3 10*3/uL (ref 0.7–4.0)
MCHC: 32.3 g/dL (ref 30.0–36.0)
MCV: 76.9 fl — ABNORMAL LOW (ref 78.0–100.0)
Monocytes Absolute: 0.8 10*3/uL (ref 0.1–1.0)
Monocytes Relative: 13.9 % — ABNORMAL HIGH (ref 3.0–12.0)
Neutro Abs: 2.7 10*3/uL (ref 1.4–7.7)
Neutrophils Relative %: 45.3 % (ref 43.0–77.0)
Platelets: 214 10*3/uL (ref 150.0–400.0)
RBC: 5.37 Mil/uL — ABNORMAL HIGH (ref 3.87–5.11)
RDW: 32.9 % — ABNORMAL HIGH (ref 11.5–15.5)
WBC: 6 10*3/uL (ref 4.0–10.5)

## 2017-03-01 LAB — FERRITIN: Ferritin: 85.6 ng/mL (ref 10.0–291.0)

## 2017-03-12 ENCOUNTER — Ambulatory Visit: Payer: 59 | Admitting: Internal Medicine

## 2017-03-30 NOTE — Addendum Note (Signed)
Addendum  created 03/30/17 1209 by Effie Berkshire, MD   Sign clinical note

## 2017-03-31 NOTE — Addendum Note (Signed)
Addendum  created 03/31/17 0830 by Duane Boston, MD   Sign clinical note

## 2017-04-09 ENCOUNTER — Ambulatory Visit (INDEPENDENT_AMBULATORY_CARE_PROVIDER_SITE_OTHER): Payer: 59 | Admitting: Gynecology

## 2017-04-09 ENCOUNTER — Other Ambulatory Visit: Payer: Self-pay | Admitting: Gynecology

## 2017-04-09 ENCOUNTER — Encounter: Payer: Self-pay | Admitting: Gynecology

## 2017-04-09 VITALS — BP 122/76 | Ht 67.0 in | Wt 197.0 lb

## 2017-04-09 DIAGNOSIS — N390 Urinary tract infection, site not specified: Secondary | ICD-10-CM | POA: Diagnosis not present

## 2017-04-09 DIAGNOSIS — Z01411 Encounter for gynecological examination (general) (routine) with abnormal findings: Secondary | ICD-10-CM | POA: Diagnosis not present

## 2017-04-09 DIAGNOSIS — N952 Postmenopausal atrophic vaginitis: Secondary | ICD-10-CM

## 2017-04-09 DIAGNOSIS — R87810 Cervical high risk human papillomavirus (HPV) DNA test positive: Secondary | ICD-10-CM

## 2017-04-09 DIAGNOSIS — R8290 Unspecified abnormal findings in urine: Secondary | ICD-10-CM

## 2017-04-09 DIAGNOSIS — Z1151 Encounter for screening for human papillomavirus (HPV): Secondary | ICD-10-CM

## 2017-04-09 DIAGNOSIS — K649 Unspecified hemorrhoids: Secondary | ICD-10-CM

## 2017-04-09 LAB — URINALYSIS W MICROSCOPIC + REFLEX CULTURE
Bilirubin Urine: NEGATIVE
Casts: NONE SEEN [LPF]
Glucose, UA: NEGATIVE
Hgb urine dipstick: NEGATIVE
Nitrite: POSITIVE — AB
Protein, ur: NEGATIVE
Specific Gravity, Urine: 1.025 (ref 1.001–1.035)
Yeast: NONE SEEN [HPF]
pH: 6 (ref 5.0–8.0)

## 2017-04-09 MED ORDER — NITROFURANTOIN MONOHYD MACRO 100 MG PO CAPS: 100.0000 mg | ORAL_CAPSULE | Freq: Two times a day (BID) | ORAL | 0 refills | Status: DC

## 2017-04-09 MED ORDER — MEDROXYPROGESTERONE ACETATE 2.5 MG PO TABS: 2.5000 mg | ORAL_TABLET | Freq: Every day | ORAL | 4 refills | Status: DC

## 2017-04-09 MED ORDER — ESTROPIPATE 0.75 MG PO TABS: 0.7500 mg | ORAL_TABLET | Freq: Every day | ORAL | 4 refills | Status: DC

## 2017-04-09 NOTE — Addendum Note (Signed)
Addended by: Nelva Nay on: 04/09/2017 10:05 AM   Modules accepted: Orders

## 2017-04-09 NOTE — Patient Instructions (Signed)
Take the Macrodantin antibiotic twice daily for 7 days. Follow up if symptoms persist, worsen or recur

## 2017-04-09 NOTE — Progress Notes (Signed)
    Victoria Mosley [birth year] [id number]        66 y.o.  G1P1001 for annual exam.  Patient also notes a strong odor to her urine and a cloudy urine appearance. No frequency dysuria or urgency low back pain fever or chills. Does have a history of renal lithiasis and was admitted for sepsis this past year. Recently saw her urologist who told her that she had no stones and had a clear urine last week.  Past medical history,surgical history, problem list, medications, allergies, family history and social history were all reviewed and documented as reviewed in the EPIC chart.  ROS:  Performed with pertinent positives and negatives included in the history, assessment and plan.   Additional significant findings :  None   Exam: [name] assistant Vitals:   04/09/17 0824  BP: 122/76  Weight: 197 lb (89.4 kg)  Height: 5\' 7"  (1.702 m)   Body mass index is 30.85 kg/m.  General appearance:  Normal affect, orientation and appearance. Skin: Grossly normal HEENT: Without gross lesions.  No cervical or supraclavicular adenopathy. Thyroid normal.  Lungs:  Clear without wheezing, rales or rhonchi Cardiac: RR, without RMG Abdominal:  Soft, nontender, without masses, guarding, rebound, organomegaly or hernia Breasts:  Examined lying and sitting without masses, retractions, discharge or axillary adenopathy. Pelvic:  Ext, BUS, Vagina: With atrophic changes  Cervix: Flush with upper vagina. Pap smear/HPV  Uterus: Difficult to palpate but no gross masses or tenderness  Adnexa: Without masses or tenderness    Anus and perineum: Normal excepting old hemorrhoids  Rectovaginal: Normal sphincter tone without palpated masses or tenderness.    Assessment/Plan:  66 y.o. G70P1001 female for annual exam.   1. Strong odor to her urine with cloudy appearance. History of renal lithiasis and sepsis secondary to this.  No frequency dysuria or urgency low back pain fever or chills. Urinalysis does show 40-60 WBC  with many bacteria. Will treat with Macrobid 100 mg twice a day 7 days. Follow up if symptoms persist, worsen or recur. 2. Postmenopausal/atrophic changes/HRT. Continues on Ogen 0.75 mg and Provera 2.5 mg. Doing well and wants to continue. I reviewed the current data to include the 2017 NAMS guidelines. Benefits as far as symptom relief and possible cardiovascular and bone health and started early versus risks to include thrombosis such as stroke heart attack DVT and breast cancer. Patient's comfortable with this and wants to continue. Refill 1 year provided. 3. 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 History of normal cytology was positive high-risk HPV preceding this. Pap smear 2017 showed normal cytology and positive high-risk HPV negative subtype 16, 18/45. Pap smear/HPV today. 4. DEXA 2017 normal. Plan repeat DEXA at 5 year interval. 5. Colonoscopy 2018. Repeat at their recommended interval. 6. Mammography 11/2016. Continue with annual mammography when due. SBE monthly reviewed. 7. Health maintenance. No routine lab work done as this is done elsewhere. Follow up 1 year, sooner as needed.  Additional time in excess of her routine gynecologic exam was spent in direct face to face counseling and coordination of care in regards to her urinary tract infection with treatment provided.    [name] MD, 8:49 AM 04/09/2017

## 2017-04-10 ENCOUNTER — Telehealth: Payer: Self-pay | Admitting: *Deleted

## 2017-04-10 NOTE — Telephone Encounter (Signed)
-----   Message from [name], MD sent at 04/09/2017  9:24 AM EDT ----- Tell patient that her urine did grow out bacteria and white cells. I sent a prescription through to the [facility] for Macrodantin twice daily for 7 days. Patient prefers being called at her office number

## 2017-04-10 NOTE — Telephone Encounter (Signed)
Pt informed

## 2017-04-11 LAB — PAP IG AND HPV HIGH-RISK: HPV DNA High Risk: DETECTED — AB

## 2017-04-11 LAB — URINE CULTURE

## 2017-04-12 ENCOUNTER — Encounter: Payer: Self-pay | Admitting: Gynecology

## 2017-04-17 ENCOUNTER — Encounter: Payer: Self-pay | Admitting: Gynecology

## 2017-04-17 ENCOUNTER — Ambulatory Visit (INDEPENDENT_AMBULATORY_CARE_PROVIDER_SITE_OTHER): Payer: 59 | Admitting: Gynecology

## 2017-04-17 VITALS — BP 120/78

## 2017-04-17 DIAGNOSIS — R87810 Cervical high risk human papillomavirus (HPV) DNA test positive: Secondary | ICD-10-CM | POA: Diagnosis not present

## 2017-04-17 DIAGNOSIS — R87610 Atypical squamous cells of undetermined significance on cytologic smear of cervix (ASC-US): Secondary | ICD-10-CM

## 2017-04-17 DIAGNOSIS — N882 Stricture and stenosis of cervix uteri: Secondary | ICD-10-CM | POA: Diagnosis not present

## 2017-04-17 NOTE — Patient Instructions (Signed)
Office will call you to arrange the appointment to see the gynecologic oncologist.

## 2017-04-17 NOTE — Progress Notes (Signed)
    Bobie Caris Tates Sep 27, 1951 [id number]        66 y.o.  G1P1001 presents for colposcopy. 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 Colposcopy was inadequate with ECC showing fragments of dysplastic epithelium favoring LGSIL. Had LEEP 2016 with LGSIL and clear margins.  Pap smear 2017 showed normal cytology, positive high-risk HPV, negative subtype 16, 18/45. Recent Pap smear showed ASCUS with positive high-risk HPV.  Past medical history,surgical history, problem list, medications, allergies, family history and social history were all reviewed and documented in the EPIC chart.  Directed ROS with pertinent positives and negatives documented in the history of present illness/assessment and plan.  Exam: [name] assistant Vitals:   04/17/17 1027  BP: 120/78   General appearance:  Normal Abdomen soft nontender without masses guarding rebound Pelvic external BUS vagina with atrophic changes. Cervix atrophic flush with the upper vagina. Uterus grossly normal size midline mobile nontender. Adnexa without masses or tenderness.  Colposcopy performed after acetic acid cleanse is inadequate with slight dimple but no true os/canal visualized.  No ectocervical abnormalities noted.  Paracervical block using 1% lidocaine, 8 cc total was placed. Single-tooth tenaculum interlooped stabilization. Using a wire dilator I attempted to enter the cervical canal unsuccessfully. For fear of creating a false channel/perforation further attempts were aborted.  Assessment/Plan:  66 y.o. G1P1001 with ASCUS positive high-risk HPV and history of LGSIL previously. 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 Options for management with the patient were reviewed to include continued cytology and as long as low-grade to follow expectantly, GYN oncology consult for their input, hysterectomy which would need to be abdominal in my hands  all reviewed. We'll proceed with GYN oncology consult for their input. If they felt hysterectomy would be most appropriate and felt they could proceed with robotic approach then I would encourage patient to pursue this. Patient knows to expect a phone call over the next several weeks to arrange this appointment. Importance of follow up stressed.   [name] MD, 11:02 AM 04/17/2017

## 2017-04-18 ENCOUNTER — Telehealth: Payer: Self-pay | Admitting: *Deleted

## 2017-04-18 NOTE — Telephone Encounter (Signed)
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

## 2017-04-18 NOTE — Telephone Encounter (Signed)
Appointment on 04/30/17 @ 11:45am with Dr.Rossi at [facility], left message for pt to call.

## 2017-04-18 NOTE — Telephone Encounter (Signed)
Pt informed

## 2017-04-30 ENCOUNTER — Telehealth: Payer: Self-pay | Admitting: Family Medicine

## 2017-04-30 ENCOUNTER — Encounter: Payer: Self-pay | Admitting: Gynecologic Oncology

## 2017-04-30 ENCOUNTER — Ambulatory Visit: Payer: PPO | Attending: Gynecologic Oncology | Admitting: Gynecologic Oncology

## 2017-04-30 VITALS — BP 126/63 | HR 65 | Temp 98.3°F | Resp 20 | Ht 67.0 in | Wt 194.0 lb

## 2017-04-30 DIAGNOSIS — R7309 Other abnormal glucose: Secondary | ICD-10-CM | POA: Insufficient documentation

## 2017-04-30 DIAGNOSIS — Z885 Allergy status to narcotic agent status: Secondary | ICD-10-CM | POA: Insufficient documentation

## 2017-04-30 DIAGNOSIS — Z881 Allergy status to other antibiotic agents status: Secondary | ICD-10-CM | POA: Diagnosis not present

## 2017-04-30 DIAGNOSIS — Z79899 Other long term (current) drug therapy: Secondary | ICD-10-CM | POA: Diagnosis not present

## 2017-04-30 DIAGNOSIS — Z96651 Presence of right artificial knee joint: Secondary | ICD-10-CM | POA: Insufficient documentation

## 2017-04-30 DIAGNOSIS — I10 Essential (primary) hypertension: Secondary | ICD-10-CM | POA: Insufficient documentation

## 2017-04-30 DIAGNOSIS — Z87442 Personal history of urinary calculi: Secondary | ICD-10-CM | POA: Diagnosis not present

## 2017-04-30 DIAGNOSIS — N882 Stricture and stenosis of cervix uteri: Secondary | ICD-10-CM | POA: Diagnosis not present

## 2017-04-30 DIAGNOSIS — E7800 Pure hypercholesterolemia, unspecified: Secondary | ICD-10-CM | POA: Insufficient documentation

## 2017-04-30 DIAGNOSIS — Z85820 Personal history of malignant melanoma of skin: Secondary | ICD-10-CM | POA: Diagnosis not present

## 2017-04-30 DIAGNOSIS — Z888 Allergy status to other drugs, medicaments and biological substances status: Secondary | ICD-10-CM | POA: Diagnosis not present

## 2017-04-30 DIAGNOSIS — Z833 Family history of diabetes mellitus: Secondary | ICD-10-CM | POA: Insufficient documentation

## 2017-04-30 DIAGNOSIS — R87810 Cervical high risk human papillomavirus (HPV) DNA test positive: Secondary | ICD-10-CM

## 2017-04-30 DIAGNOSIS — Z8052 Family history of malignant neoplasm of bladder: Secondary | ICD-10-CM | POA: Insufficient documentation

## 2017-04-30 DIAGNOSIS — K219 Gastro-esophageal reflux disease without esophagitis: Secondary | ICD-10-CM | POA: Insufficient documentation

## 2017-04-30 DIAGNOSIS — Z87891 Personal history of nicotine dependence: Secondary | ICD-10-CM | POA: Insufficient documentation

## 2017-04-30 DIAGNOSIS — Z8249 Family history of ischemic heart disease and other diseases of the circulatory system: Secondary | ICD-10-CM | POA: Insufficient documentation

## 2017-04-30 DIAGNOSIS — Z806 Family history of leukemia: Secondary | ICD-10-CM | POA: Diagnosis not present

## 2017-04-30 DIAGNOSIS — R87610 Atypical squamous cells of undetermined significance on cytologic smear of cervix (ASC-US): Secondary | ICD-10-CM | POA: Diagnosis not present

## 2017-04-30 DIAGNOSIS — Z96642 Presence of left artificial hip joint: Secondary | ICD-10-CM | POA: Diagnosis not present

## 2017-04-30 DIAGNOSIS — K589 Irritable bowel syndrome without diarrhea: Secondary | ICD-10-CM | POA: Insufficient documentation

## 2017-04-30 NOTE — Telephone Encounter (Signed)
Patient requests all medications on file refilled with 90 days supply.  Requests a call back and at least leave a message right after you call them in.   Pharmacy- [facility] on [address]

## 2017-04-30 NOTE — Patient Instructions (Signed)
Please follow-up with Dr Phineas Real for pap and HPV testing in 12 months.

## 2017-04-30 NOTE — Progress Notes (Signed)
Consult Note: Gyn-Onc  Consult was requested by Dr.Fontaine for the evaluation of Victoria Mosley 66 y.o. female  CC:  Chief Complaint  Patient presents with  . Pap Smear abnormality of Cervix with ASCUS favoring dysplasi    Assessment/Plan:  Victoria Mosley  is a 66 y.o.  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  I had a lengthy discussion with Victoria Mosley about the nature of chronic HPV infection and the association with dysplasia. I explained that HPV infection was not treatable, however, may still spontaneously clear. While present, it has the potential to induce dysplasia and cancer. I discussed that low grade dysplasia of which she has had in the past and likely present, has a <5% likelihood of developing to cancer (more in the order of 1%).  Therefore it does not require intervention (including hysterectomy). However, the concern for Victoria Mosley is that it is not possible to adequately ensure that her dysplasia is not something worse (such as high grade dysplasia in the endocervical canal).  I have a low suspicion for such a process at present given her history of always low grade disease and an ASCUS pap. However, I strenuously reinforced the limitation in sampling errors of paps.  I discussed that hysterectomy would resolve the issue of diagnostic dilemma in this situation, however, hysterectomy is certainly a radical intervention for a low grade dysplastic process and would represent overtreatment. We discussed that it does not absolve her of the need for pap surveillance due to the high risk incidence of vaginal dysplasia following hysterectomy for dysplasia. I discussed the challenges of diagnosis of cancer and dysplasia in the vagina due to rugation and missed lesions. I discussed that she would require vaginal cytology for at least 20 years after hysterectomy.  Victoria Mosley has had multiple surgeries  in the past including some with complications, and therefore she is wary of surgery. She would like to consider more and avoid if not absolutely necessary.  I feel that she would be a good candidate for a robotic assisted total hysterectomy and BSO if she decides to proceed with surgery and she will let me know that if that is the case.  In the interim, I recommend repeating the pap and ECC in 12 months.   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  She has had only 2 sexual partners in her life. She developed HPV positivity on paps in 2015, at that time the cytology was normal. In 2016 the HPV was persistent and colposcopy was inadequate due to inability to evaluate the endocervical canal. The ECC showed fragments of LGSIL. She underwent a LEEP (for inadequate colpo) in 2016 which showed LGSIL with negative margins.  Pap in 2017 showed normal cytology but persistent HPV. The pap in 2018 showed ASCUS with positive high risk HPV.  She has had a tubal ligation and 1 cesarean section (no vaginal deliveries), but has had mutliple orthopedic surgeries in the past. Her most recent surgery was extraction of an infected kidney stone. She became septic and was admitted to the ICU. She has recovered well. She has borderline DM and HTN. She has migraines and hypercholesterolemia.     Current Meds:  Outpatient Encounter Prescriptions as of 04/30/2017  Medication Sig  . acetaminophen (TYLENOL) 325 MG tablet Take 650  mg by mouth every 6 (six) hours as needed for mild pain or headache.  Marland Kitchen amoxicillin (AMOXIL) 500 MG tablet Take 2,000 mg by mouth See admin instructions. Takes 1 hour prior to dental procedures  . Ascorbic Acid (VITAMIN C) 1000 MG tablet Take 1,000 mg by mouth daily.  Marland Kitchen b complex vitamins tablet Take 1 tablet by mouth daily.  . Butalbital-APAP-Caffeine 50-300-40 MG CAPS Take 1 capsule  by mouth 2 (two) times daily as needed (headache). (Patient taking differently: Take 1 capsule by mouth 2 (two) times daily as needed (headache). )  . CALCIUM CITRATE PO Take 1,000 mg by mouth every evening.  . Cranberry 250 MG CAPS Take 500 mg by mouth 2 (two) times daily.  . diphenhydrAMINE (BENADRYL) 25 mg capsule Take 25 mg by mouth at bedtime.  Marland Kitchen estropipate (OGEN) 0.75 MG tablet Take 1 tablet (0.75 mg total) by mouth daily.  Marland Kitchen ezetimibe (ZETIA) 10 MG tablet Take 1 tablet (10 mg total) by mouth daily.  . Flaxseed, Linseed, (FLAXSEED OIL) 1200 MG CAPS Take 1,200 mg by mouth 2 (two) times daily.  . hydrochlorothiazide (HYDRODIURIL) 25 MG tablet Take 1 tablet (25 mg total) by mouth daily. Hold for another three days, can restart on 4/13 (Patient taking differently: Take 25 mg by mouth daily. )  . LACTOBACILLUS PO Take 1 tablet by mouth daily.  Marland Kitchen loratadine (CLARITIN) 10 MG tablet Take 10 mg by mouth daily.   . medroxyPROGESTERone (PROVERA) 2.5 MG tablet Take 1 tablet (2.5 mg total) by mouth daily.  . Multiple Vitamins-Minerals (WOMENS 50+ MULTI VITAMIN/MIN PO) Take 1 tablet by mouth daily.  . naproxen sodium (ANAPROX) 220 MG tablet Take 220 mg by mouth 2 (two) times daily with a meal.   . nitrofurantoin, macrocrystal-monohydrate, (MACROBID) 100 MG capsule Take 1 capsule (100 mg total) by mouth 2 (two) times daily. For 7 days  . Omega-3 Fatty Acids (FISH OIL) 1000 MG CAPS Take 1,000 mg by mouth daily.  Marland Kitchen omeprazole (PRILOSEC) 20 MG capsule Take 1 capsule (20 mg total) by mouth daily. (Patient taking differently: Take 20 mg by mouth at bedtime. )  . potassium gluconate 595 MG TABS Take 1,190 mg by mouth every evening.   . prochlorperazine (COMPAZINE) 10 MG tablet Take 10 mg by mouth every 6 (six) hours as needed for nausea or vomiting.  . propranolol (INDERAL) 40 MG tablet Take 1 tablet (40 mg total) by mouth daily. Hold for three more days, can resume on 4/13 (Patient taking differently: Take 40  mg by mouth daily. )  . traMADol (ULTRAM) 50 MG tablet Take 1-2 tablets (50-100 mg total) by mouth every 6 (six) hours as needed for moderate pain.   No facility-administered encounter medications on file as of 04/30/2017.     Allergy:  Allergies  Allergen Reactions  . Levaquin [Levofloxacin In D5w] Anaphylaxis, Rash and Other (See Comments)    Hot, dizziness, diarrhea  . Pravastatin Sodium [Pravachol]     Joint pain(severe)  . Biaxin [Clarithromycin] Nausea And Vomiting  . Ciprofloxacin Nausea Only  . Clarithromycin Nausea Only  . Codeine Nausea And Vomiting  . Dust Mite Extract Other (See Comments)      Grass, trees also- cause Runny nose, sneezing (allergy symptoms)  . Septra [Sulfamethoxazole-Trimethoprim]     diarrhea    Social Hx:   Social History   Social History  . Marital status: Married    Spouse name: Remo Lipps  . Number of children: 1  . Years  of education: N/A   Occupational History  . Billing supervisor    Social History Main Topics  . Smoking status: Former Smoker    Packs/day: 0.25    Quit date: 12/20/1998  . Smokeless tobacco: Never Used  . Alcohol use No  . Drug use: No  . Sexual activity: Yes    Partners: Male    Birth control/ protection: Post-menopausal     Comment: 1st intercourse 77 yo-2 partners   Other Topics Concern  . Not on file   Social History Narrative  . No narrative on file    Past Surgical Hx:  Past Surgical History:  Procedure Laterality Date  . BACK SURGERY  10/1991   L5 ruptured disc- Dr.Robinson  . CESAREAN SECTION  12/1980  . COLONOSCOPY  03-28-12   per Dr. Henrene Pastor, polyp,  repeat in 5 yrs  . CYSTOSCOPY W/ URETERAL STENT PLACEMENT Right 02/02/2017   Procedure: CYSTOSCOPY WITH RETROGRADE PYELOGRAM/URETERAL STENT PLACEMENT;  Surgeon: Irine Seal, MD;  Location: [facility];  Service: Urology;  Laterality: Right;  . CYSTOSCOPY WITH RETROGRADE PYELOGRAM, URETEROSCOPY AND STENT PLACEMENT Left 12/25/2012   Procedure: CYSTOSCOPY WITH  RETROGRADE PYELOGRAM, URETEROSCOPY, EXTRACTION OF  LEFT STONE WITH BASKETAND STENT PLACEMENT;  Surgeon: Molli Hazard, MD;  Location: [facility];  Service: Urology;  Laterality: Left;  2.5 HRS   . CYSTOSCOPY/URETEROSCOPY/HOLMIUM LASER/STENT PLACEMENT Right 02/15/2017   Procedure: CYSTOSCOPY/URETEROSCOPY/HOLMIUM LASER/STENT EXCHANGE;  Surgeon: Ardis Hughs, MD;  Location: [facility];  Service: Urology;  Laterality: Right;  . FOOT SURGERY Bilateral 1987   per Dr. Alphonzo Cruise  . HEMATOMA EVACUATION  07/08/2012   Procedure: EVACUATION HEMATOMA;  Surgeon: Kristeen Miss, MD;  Location: [facility];  Service: Neurosurgery;  Laterality: N/A;  Cervical Seven-thoracic One Laminectomy and Decompression of Epidural Hematoma  . HEMORRHOID BANDING     June and August 2016, Dr Rosendo Gros  . KIDNEY STONE SURGERY  2005   per Dr. Reece Agar  . LEEP  07/2015   final pathology LGSIL with clear margins negative ECC  . LITHOTRIPSY  2007   x 3  . [place], at L5 per Dr. Quentin Cornwall  . MELANOMA EXCISION  2007   left upper arm per dr. Teressa Senter  . NASAL SEPTUM SURGERY  1992   per Dr. Cynda Familia  . POSTERIOR CERVICAL LAMINECTOMY  07/08/2012   Procedure: POSTERIOR CERVICAL LAMINECTOMY;  Surgeon: Kristeen Miss, MD;  Location: [facility] [facility];  Service: Neurosurgery;  Laterality: N/A;  Cervical Seven- thoracic One Laminectomy and Decompression of Epidural Hematoma  . TOTAL HIP ARTHROPLASTY Right 2010   [place]  . TOTAL HIP ARTHROPLASTY Left 09/27/2016   Procedure: LEFT TOTAL HIP ARTHROPLASTY ANTERIOR APPROACH;  Surgeon: Gaynelle Arabian, MD;  Location: [facility];  Service: Orthopedics;  Laterality: Left;  . TOTAL KNEE ARTHROPLASTY Right 12/07/2014   Procedure: RIGHT TOTAL KNEE ARTHROPLASTY;  Surgeon: [name], MD;  Location: [facility];  Service: Orthopedics;  Laterality: Right;  . TUBAL LIGATION  01/1988    Past Medical Hx:  Past Medical History:  Diagnosis Date  . Acne    and granuloma  annulare, sees Dr. Allyson Sabal   . Adenomatous colon polyp 2004  . Arthritis   . Bilateral knee pain    sees Dr. Wynelle Link   . Bilateral renal cysts   . Chronic UTI (urinary tract infection)   . Complication of anesthesia   . Cough 11/30/14    cough since 11/20/14- now productine- green with green  mucus out of nose- no fever  . Diverticulitis   . GERD (gastroesophageal reflux disease)   . History of kidney stones   . History of melanoma excision   . History of renal calculi   . HPV in female 01/2014, 02/2015, 03/2016, 03/2017  . Hyperlipidemia   . IBS (irritable bowel syndrome)   . Kidney stones    bilateral  . melanoma dx'd 2011   rt arm--surg only  . Menopause    sees Dr. Phineas Real  . Migraines   . Nephrolithiasis    left  . Neurogenic bladder   . Normal cardiac stress test 2012  . PONV (postoperative nausea and vomiting)   . Sepsis ([place]) 01/2016   related to infection caused by kidney stone blockage   . Tendonitis    right ankle  . Urinary urgency   . UTI (urinary tract infection)    started atb on 03-24-12    Past Gynecological History:  HPV and LGSIL. S/p LEEP. C/s x 1 No LMP recorded. Patient is postmenopausal.  Family Hx:  Family History  Problem Relation Age of Onset  . Heart disease Mother   . Hypertension Mother   . Diabetes Mother   . Hypertension Father   . Leukemia Father   . Hypertension Sister   . Heart disease Sister   . Bladder Cancer Sister   . Stomach cancer Paternal Grandmother   . Colon cancer Neg Hx   . Esophageal cancer Neg Hx   . Rectal cancer Neg Hx     Review of Systems:  Constitutional  Feels well,    ENT Normal appearing ears and nares bilaterally Skin/Breast  No rash, sores, jaundice, itching, dryness Cardiovascular  No chest pain, shortness of breath, or edema  Pulmonary  No cough or wheeze.  Gastro Intestinal  No nausea, vomitting, or diarrhoea. No bright red blood per rectum, no abdominal pain, change in bowel movement, or  constipation.  Genito Urinary  No frequency, urgency, dysuria, no bleeding or discharge Musculo Skeletal  No myalgia, arthralgia, joint swelling or pain  Neurologic  No weakness, numbness, change in gait,  Psychology  No depression, anxiety, insomnia.   Vitals:  Blood pressure 126/63, pulse 65, temperature 98.3 F (36.8 C), temperature source Oral, resp. rate 20, height 5\' 7"  (1.702 m), weight 194 lb (88 kg), SpO2 95 %.  Physical Exam: WD in NAD Neck  Supple NROM, without any enlargements.  Lymph Node Survey No cervical supraclavicular or inguinal adenopathy Cardiovascular  Pulse normal rate, regularity and rhythm. S1 and S2 normal.  Lungs  Clear to auscultation bilateraly, without wheezes/crackles/rhonchi. Good air movement.  Skin  No rash/lesions/breakdown  Psychiatry  Alert and oriented to person, place, and time  Abdomen  Normoactive bowel sounds, abdomen soft, non-tender and nonobese without evidence of hernia.  Back No CVA tenderness Genito Urinary  Vulva/vagina: Normal external female genitalia.   No lesions. No discharge or bleeding.  Bladder/urethra:  No lesions or masses, well supported bladder  Vagina: normal, long, extensive rugations  Cervix: Normal appearing, no lesions. The anterior lip is exophytic, the posterior lip is more flush with the vagina.  Uterus:  Small, mobile, no parametrial involvement or nodularity.  Adnexa: no palpable masses. Rectal  deferred Extremities  No bilateral cyanosis, clubbing or edema.   Donaciano Eva, MD  04/30/2017, 12:09 PM

## 2017-05-01 NOTE — Telephone Encounter (Signed)
Sent pt a my chart message, we need exact names of medications.

## 2017-05-03 ENCOUNTER — Encounter: Payer: Self-pay | Admitting: Gynecology

## 2017-05-07 ENCOUNTER — Other Ambulatory Visit: Payer: Self-pay | Admitting: Family Medicine

## 2017-05-07 MED ORDER — PROPRANOLOL HCL 40 MG PO TABS: 40.0000 mg | ORAL_TABLET | Freq: Every day | ORAL | 1 refills | Status: DC

## 2017-05-07 MED ORDER — HYDROCHLOROTHIAZIDE 25 MG PO TABS: 25.0000 mg | ORAL_TABLET | Freq: Every day | ORAL | 1 refills | Status: DC

## 2017-05-07 MED ORDER — OMEPRAZOLE 20 MG PO CPDR: 20.0000 mg | DELAYED_RELEASE_CAPSULE | Freq: Every day | ORAL | 1 refills | Status: DC

## 2017-05-07 MED ORDER — EZETIMIBE 10 MG PO TABS: 10.0000 mg | ORAL_TABLET | Freq: Every day | ORAL | 1 refills | Status: DC

## 2017-05-07 NOTE — Telephone Encounter (Signed)
Refill request for Hydrochlorothiazide 25 mg, Ezetimibe 10 mg, Omeprazole 20 mg, Propranolol 40 mg and send all to [facility] on [address] [facility] day supply and send pt a my chart message once completed. Pt recently changed pharmacies.

## 2017-05-07 NOTE — Telephone Encounter (Signed)
Scripts were sent and my chart message to patient.

## 2017-05-15 DIAGNOSIS — D225 Melanocytic nevi of trunk: Secondary | ICD-10-CM | POA: Diagnosis not present

## 2017-05-15 DIAGNOSIS — L814 Other melanin hyperpigmentation: Secondary | ICD-10-CM | POA: Diagnosis not present

## 2017-05-15 DIAGNOSIS — D1801 Hemangioma of skin and subcutaneous tissue: Secondary | ICD-10-CM | POA: Diagnosis not present

## 2017-05-15 DIAGNOSIS — L218 Other seborrheic dermatitis: Secondary | ICD-10-CM | POA: Diagnosis not present

## 2017-05-15 DIAGNOSIS — Z85820 Personal history of malignant melanoma of skin: Secondary | ICD-10-CM | POA: Diagnosis not present

## 2017-05-15 DIAGNOSIS — L821 Other seborrheic keratosis: Secondary | ICD-10-CM | POA: Diagnosis not present

## 2017-06-06 DIAGNOSIS — M5412 Radiculopathy, cervical region: Secondary | ICD-10-CM | POA: Diagnosis not present

## 2017-06-06 DIAGNOSIS — Z6829 Body mass index (BMI) 29.0-29.9, adult: Secondary | ICD-10-CM | POA: Diagnosis not present

## 2017-06-06 DIAGNOSIS — G5603 Carpal tunnel syndrome, bilateral upper limbs: Secondary | ICD-10-CM | POA: Diagnosis not present

## 2017-06-11 ENCOUNTER — Encounter: Payer: Self-pay | Admitting: Family Medicine

## 2017-06-11 ENCOUNTER — Ambulatory Visit (INDEPENDENT_AMBULATORY_CARE_PROVIDER_SITE_OTHER): Payer: PPO | Admitting: Family Medicine

## 2017-06-11 VITALS — BP 126/84 | HR 80 | Temp 98.4°F | Ht 67.0 in | Wt 192.0 lb

## 2017-06-11 DIAGNOSIS — J320 Chronic maxillary sinusitis: Secondary | ICD-10-CM | POA: Diagnosis not present

## 2017-06-11 MED ORDER — FLUCONAZOLE 150 MG PO TABS: 150.0000 mg | ORAL_TABLET | ORAL | 2 refills | Status: DC

## 2017-06-11 MED ORDER — CEFUROXIME AXETIL 500 MG PO TABS: 500.0000 mg | ORAL_TABLET | Freq: Two times a day (BID) | ORAL | 0 refills | Status: DC

## 2017-06-11 MED ORDER — TRAMADOL HCL 50 MG PO TABS: 50.0000 mg | ORAL_TABLET | Freq: Four times a day (QID) | ORAL | 2 refills | Status: DC | PRN

## 2017-06-11 NOTE — Patient Instructions (Signed)
WE NOW OFFER   [facility] FAST TRACK!!!  SAME DAY Appointments for ACUTE CARE  Such as: Sprains, Injuries, cuts, abrasions, rashes, muscle pain, joint pain, back pain Colds, flu, sore throats, headache, allergies, cough, fever  Ear pain, sinus and eye infections Abdominal pain, nausea, vomiting, diarrhea, upset stomach Animal/insect bites  3 Easy Ways to Schedule: Walk-In Scheduling Call in scheduling Mychart Sign-up: [email or web address]         

## 2017-06-11 NOTE — Progress Notes (Signed)
   Subjective:    Patient ID: Victoria Mosley, female    DOB: Jul 09, 1951, 66 y.o.   MRN: [id number]  HPI Here for recurrent sinusitis symptoms including sinus pressure, blowing green mucus from the nose, and PND. She has taken many rounds of antibiotics the last 2 years and cannot seem to clear this up. She had nasal endoscopy last year but this did not seem to help.    Review of Systems  Constitutional: Negative.   HENT: Positive for congestion, postnasal drip, sinus pain and sinus pressure. Negative for sore throat.   Eyes: Negative.   Respiratory: Negative.        Objective:   Physical Exam  Constitutional: She appears well-developed and well-nourished.  HENT:  Right Ear: External ear normal.  Left Ear: External ear normal.  Nose: Nose normal.  Mouth/Throat: Oropharynx is clear and moist.  Eyes: Conjunctivae are normal.  Neck: No thyromegaly present.  Cardiovascular: Normal rate, regular rhythm, normal heart sounds and intact distal pulses.   Pulmonary/Chest: Effort normal and breath sounds normal. No respiratory distress. She has no wheezes. She has no rales.  Lymphadenopathy:    She has no cervical adenopathy.          Assessment & Plan:  Recurrent sinusitis. Try a 6 week course of Ceftin 500 mg bid.  Alysia Penna, MD

## 2017-06-27 DIAGNOSIS — G5603 Carpal tunnel syndrome, bilateral upper limbs: Secondary | ICD-10-CM | POA: Diagnosis not present

## 2017-06-27 DIAGNOSIS — M5412 Radiculopathy, cervical region: Secondary | ICD-10-CM | POA: Diagnosis not present

## 2017-07-10 DIAGNOSIS — M542 Cervicalgia: Secondary | ICD-10-CM | POA: Diagnosis not present

## 2017-07-10 DIAGNOSIS — M5412 Radiculopathy, cervical region: Secondary | ICD-10-CM | POA: Diagnosis not present

## 2017-07-12 DIAGNOSIS — G5603 Carpal tunnel syndrome, bilateral upper limbs: Secondary | ICD-10-CM | POA: Diagnosis not present

## 2017-07-12 DIAGNOSIS — M5412 Radiculopathy, cervical region: Secondary | ICD-10-CM | POA: Diagnosis not present

## 2017-07-19 ENCOUNTER — Encounter: Payer: Self-pay | Admitting: Family Medicine

## 2017-07-26 ENCOUNTER — Ambulatory Visit (INDEPENDENT_AMBULATORY_CARE_PROVIDER_SITE_OTHER): Payer: PPO | Admitting: Family Medicine

## 2017-07-26 ENCOUNTER — Encounter: Payer: Self-pay | Admitting: Family Medicine

## 2017-07-26 VITALS — BP 110/67 | HR 71 | Temp 98.5°F | Ht 67.0 in | Wt 195.0 lb

## 2017-07-26 DIAGNOSIS — J0181 Other acute recurrent sinusitis: Secondary | ICD-10-CM

## 2017-07-26 DIAGNOSIS — Z23 Encounter for immunization: Secondary | ICD-10-CM

## 2017-07-26 MED ORDER — DOXYCYCLINE HYCLATE 100 MG PO CAPS: 100.0000 mg | ORAL_CAPSULE | Freq: Two times a day (BID) | ORAL | 0 refills | Status: AC

## 2017-07-26 NOTE — Progress Notes (Signed)
   Subjective:    Patient ID: Victoria Mosley, female    DOB: 06/09/1951, 66 y.o.   MRN: [id number]  HPI Here for continued pressure over the left cheek area, sinus congestion, and blowing yellow green mucus from the nose. No fever. We saw her 6 weeks ago for these symptoms and gave her 10 days of Ceftin. This did not help her at all. She is using saline nasal rinses.   Review of Systems  Constitutional: Negative.   HENT: Positive for congestion, postnasal drip, sinus pain and sinus pressure. Negative for ear pain and sore throat.   Eyes: Negative.   Respiratory: Negative.        Objective:   Physical Exam  Constitutional: She appears well-developed and well-nourished.  HENT:  Right Ear: External ear normal.  Left Ear: External ear normal.  Nose: Nose normal.  Mouth/Throat: Oropharynx is clear and moist.  Eyes: Conjunctivae are normal.  Neck: No thyromegaly present.  Pulmonary/Chest: Effort normal and breath sounds normal. No respiratory distress. She has no wheezes. She has no rales.  Lymphadenopathy:    She has no cervical adenopathy.          Assessment & Plan:  Sinusitis, we will try Doxycycline for 10 days.  Alysia Penna, MD

## 2017-07-26 NOTE — Patient Instructions (Signed)
WE NOW OFFER   [facility] FAST TRACK!!!  SAME DAY Appointments for ACUTE CARE  Such as: Sprains, Injuries, cuts, abrasions, rashes, muscle pain, joint pain, back pain Colds, flu, sore throats, headache, allergies, cough, fever  Ear pain, sinus and eye infections Abdominal pain, nausea, vomiting, diarrhea, upset stomach Animal/insect bites  3 Easy Ways to Schedule: Walk-In Scheduling Call in scheduling Mychart Sign-up: [email or web address]         

## 2017-08-20 ENCOUNTER — Encounter: Payer: Self-pay | Admitting: Family Medicine

## 2017-08-20 ENCOUNTER — Ambulatory Visit (INDEPENDENT_AMBULATORY_CARE_PROVIDER_SITE_OTHER): Payer: PPO | Admitting: Family Medicine

## 2017-08-20 VITALS — BP 128/84 | HR 76 | Temp 98.8°F | Wt 192.0 lb

## 2017-08-20 DIAGNOSIS — J0111 Acute recurrent frontal sinusitis: Secondary | ICD-10-CM | POA: Diagnosis not present

## 2017-08-20 DIAGNOSIS — R350 Frequency of micturition: Secondary | ICD-10-CM | POA: Diagnosis not present

## 2017-08-20 DIAGNOSIS — N390 Urinary tract infection, site not specified: Secondary | ICD-10-CM | POA: Diagnosis not present

## 2017-08-20 LAB — POC URINALSYSI DIPSTICK (AUTOMATED)
Bilirubin, UA: NEGATIVE
Blood, UA: NEGATIVE
Glucose, UA: NEGATIVE
Ketones, UA: NEGATIVE
Leukocytes, UA: NEGATIVE
Nitrite, UA: POSITIVE
Protein, UA: NEGATIVE
Spec Grav, UA: 1.025 (ref 1.010–1.025)
Urobilinogen, UA: 0.2 E.U./dL
pH, UA: 6 (ref 5.0–8.0)

## 2017-08-20 MED ORDER — DOXYCYCLINE HYCLATE 100 MG PO CAPS: 100.0000 mg | ORAL_CAPSULE | Freq: Two times a day (BID) | ORAL | 0 refills | Status: AC

## 2017-08-20 MED ORDER — FLUCONAZOLE 150 MG PO TABS: 150.0000 mg | ORAL_TABLET | ORAL | 5 refills | Status: DC

## 2017-08-20 NOTE — Progress Notes (Signed)
   Subjective:    Patient ID: Victoria Mosley, female    DOB: 05-29-1951, 66 y.o.   MRN: [id number]  HPI Here for continued sinusitis symptoms and a possible UTI. She was here last month wit sinus pressure, PND, and blowing green mucus from the nose. She did not respond to Ceftin at that time so we switched to Doxycycline. She seemed to be getting better for the 10 days she took this, but the sinus symptoms returned after that. No fever. Now she also has 2 days of urinary burning and urgency.    Review of Systems  Constitutional: Negative.   HENT: Positive for congestion, postnasal drip and sinus pressure. Negative for ear pain, sinus pain and sore throat.   Eyes: Negative.   Respiratory: Positive for cough.   Gastrointestinal: Negative.   Genitourinary: Positive for dysuria, frequency and urgency.       Objective:   Physical Exam  Constitutional: She appears well-developed and well-nourished.  HENT:  Right Ear: External ear normal.  Left Ear: External ear normal.  Nose: Nose normal.  Mouth/Throat: Oropharynx is clear and moist.  Eyes: Conjunctivae are normal.  Neck: No thyromegaly present.  Cardiovascular: Normal rate, regular rhythm, normal heart sounds and intact distal pulses.   Pulmonary/Chest: Effort normal and breath sounds normal. No respiratory distress. She has no wheezes. She has no rales.  Abdominal: Soft. Bowel sounds are normal. She exhibits no distension and no mass. There is no tenderness. There is no rebound and no guarding.  Lymphadenopathy:    She has no cervical adenopathy.          Assessment & Plan:  Partially treated sinusitis, she will take a full 30 day course of Doxycycline. For the UTI, we will culture the sample. Hopefully the Doxycycline will cover this as well.  Alysia Penna, MD

## 2017-08-22 LAB — URINE CULTURE
MICRO NUMBER:: 81178134
SPECIMEN QUALITY:: ADEQUATE

## 2017-08-23 ENCOUNTER — Other Ambulatory Visit: Payer: Self-pay | Admitting: Family Medicine

## 2017-08-23 MED ORDER — NITROFURANTOIN MONOHYD MACRO 100 MG PO CAPS: 100.0000 mg | ORAL_CAPSULE | Freq: Two times a day (BID) | ORAL | 0 refills | Status: DC

## 2017-08-29 DIAGNOSIS — G5602 Carpal tunnel syndrome, left upper limb: Secondary | ICD-10-CM | POA: Diagnosis not present

## 2017-08-29 DIAGNOSIS — G5601 Carpal tunnel syndrome, right upper limb: Secondary | ICD-10-CM | POA: Diagnosis not present

## 2017-08-29 DIAGNOSIS — G5603 Carpal tunnel syndrome, bilateral upper limbs: Secondary | ICD-10-CM | POA: Diagnosis not present

## 2017-08-29 DIAGNOSIS — M65311 Trigger thumb, right thumb: Secondary | ICD-10-CM | POA: Diagnosis not present

## 2017-08-29 DIAGNOSIS — M180 Bilateral primary osteoarthritis of first carpometacarpal joints: Secondary | ICD-10-CM | POA: Diagnosis not present

## 2017-09-05 ENCOUNTER — Telehealth: Payer: Self-pay | Admitting: *Deleted

## 2017-09-05 NOTE — Telephone Encounter (Signed)
Prior Authorization approved for estropipate 0.625 mg tablet until 10/11/18. Pharmacy informed as well.

## 2017-09-28 ENCOUNTER — Telehealth: Payer: Self-pay | Admitting: *Deleted

## 2017-09-28 DIAGNOSIS — J320 Chronic maxillary sinusitis: Secondary | ICD-10-CM | POA: Insufficient documentation

## 2017-09-28 DIAGNOSIS — J329 Chronic sinusitis, unspecified: Secondary | ICD-10-CM

## 2017-09-28 NOTE — Telephone Encounter (Signed)
Sent to PCP to place referral  

## 2017-09-28 NOTE — Telephone Encounter (Signed)
Copied from [place] [id number]. Topic: Referral - Request >> Sep 28, 2017 11:59 AM Ether Griffins B wrote: Reason for CRM: pt has been having worsening sinus problems for 2 years and would like a referral to infectious disease. Has already been to ENT. Pt was just on 30 day antibiotic and as soon as she finished it her sinus worsened

## 2017-09-28 NOTE — Telephone Encounter (Signed)
Pt advised and voiced understanding.   

## 2017-09-28 NOTE — Telephone Encounter (Signed)
I put in the referral

## 2017-10-12 ENCOUNTER — Encounter: Payer: Self-pay | Admitting: Infectious Diseases

## 2017-10-12 ENCOUNTER — Ambulatory Visit (INDEPENDENT_AMBULATORY_CARE_PROVIDER_SITE_OTHER): Payer: PPO | Admitting: Infectious Diseases

## 2017-10-12 VITALS — BP 117/76 | HR 74 | Temp 97.4°F | Wt 194.0 lb

## 2017-10-12 DIAGNOSIS — B379 Candidiasis, unspecified: Secondary | ICD-10-CM | POA: Insufficient documentation

## 2017-10-12 DIAGNOSIS — J320 Chronic maxillary sinusitis: Secondary | ICD-10-CM

## 2017-10-12 DIAGNOSIS — R110 Nausea: Secondary | ICD-10-CM | POA: Diagnosis not present

## 2017-10-12 MED ORDER — SULFAMETHOXAZOLE-TRIMETHOPRIM 800-160 MG PO TABS: 1.0000 | ORAL_TABLET | Freq: Two times a day (BID) | ORAL | 0 refills | Status: DC

## 2017-10-12 MED ORDER — FLUCONAZOLE 150 MG PO TABS: 150.0000 mg | ORAL_TABLET | ORAL | 0 refills | Status: DC

## 2017-10-12 MED ORDER — PROCHLORPERAZINE MALEATE 10 MG PO TABS: 10.0000 mg | ORAL_TABLET | Freq: Four times a day (QID) | ORAL | 0 refills | Status: DC | PRN

## 2017-10-12 NOTE — Progress Notes (Signed)
Victoria Mosley  1951/02/19 [id number] PCP: Laurey Morale, MD  Referring Provider: Alysia Penna, MD   Reason for Visit: chronic recurrent left maxillary sinusitis - initial consultation   HPI: Victoria Mosley is a 66 y.o. female with pmhx of chronic sinus infections for the last 2 years that is refractor to several different antibiotics and surgery. She does not have fevers or significant pain associated with episodes but has very bothersome and often involuntary purulent drainage from her left nare in addition to irritation, postnasal drainage, dysgeusia, halitosis and congestion over her left maxillary sinus. She is a patient of Dr. Janace Hoard with ENT since 2017. With review of office notes she has had several nasal endoscopic irrigations of her sinuses. Was given Clindamycin and per records had notable improvement in symptoms and resolution of purulent drainage. She required follow up with him in January 2018 for return of symptoms despite several attempts at outpatient abx treatment with a variety of different options. Again nasal endoscopy cleaning was performed and culture was obtained of purulent material found in the ethmoid sinus. Noted to have irritation to the left septum as well as perforation. Mild edema noted. Antrostomy and sinuses were open at this time.   As a child she struggled with severe acne and required antibiotics for this. 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 She was decolonized with Bactroban and follow up in April 2018 revealed negative pcr. She does have problems with environmental allergies and is maintained on Claritin during the day and Benadryl at night for this. Does not use OTC nasal steroid sprays as she has trouble with epistaxis episodes. Does use PRN Afrin nasal decongestant for epistaxis control.   Over the last few months has tried Augmentin, Ceftin, and most recently was on a 30 day course of Doxycycline. She usually has some improvement with these  antibiotics regarding improvement in drainage quantity and quality, malaise and congestion; however symptoms usually return to pre-treatment baseline within 2-3 weeks of treatment end. She is getting ready to go on a cruise and would like to get an antibiotic regimen she can tolerate that will be effective against her recurrent MRSA sinus infection.   Patient Active Problem List   Diagnosis Date Noted  . Nausea 10/12/2017  . Yeast infection 10/12/2017  . Chronic sinusitis 09/28/2017  . Sepsis ([place]) 02/02/2017  . OA (osteoarthritis) of hip 09/27/2016  . S/P total knee arthroplasty 12/19/2014  . Intractable back pain 12/19/2014  . Sciatica of left side 12/19/2014  . Migraines 12/19/2014  . DDD (degenerative disc disease), lumbar 12/19/2014  . Fever 12/19/2014  . Left-sided low back pain with left-sided sciatica   . Status post total right knee replacement   . OA (osteoarthritis) of knee 12/07/2014  . 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 Migraine with aura, intractable 08/06/2012  . Overactive bladder 07/31/2012  . Traumatic epidural hematoma ([place]) 07/12/2012  . IBS (irritable bowel syndrome) 07/26/2011  . OSTEOARTHRITIS 01/26/2010  . NEPHROLITHIASIS, HX OF 01/26/2010  . DIVERTICULITIS, COLON 11/02/2008  . Hyperlipidemia 02/25/2008  . COMMON MIGRAINE 10/17/2007  . ALLERGIC RHINITIS 06/19/2007  . GERD 06/19/2007    Review of Systems  Constitutional: Positive for malaise/fatigue. Negative for chills, diaphoresis and fever.  HENT: Positive for congestion, ear pain, nosebleeds, sinus pain and sore throat. Negative for ear discharge, hearing loss and tinnitus.   Eyes: Negative for blurred vision and photophobia.  Respiratory: Negative for cough and shortness of breath.   Cardiovascular: Negative for chest pain and leg swelling.  Gastrointestinal: Negative for abdominal pain and diarrhea.  Genitourinary:  Negative for dysuria and urgency.  Skin: Negative for rash.  Neurological: Positive for dizziness and headaches.     Past Medical History:  Diagnosis Date  . 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 Adenomatous colon polyp 2004  . Arthritis   . Bilateral knee pain    sees Dr. Wynelle Link   . Bilateral renal cysts   . Chronic UTI (urinary tract infection)   . Complication of anesthesia   . Cough 11/30/14    cough since 11/20/14- now productine- green with green mucus out of nose- no fever  . Diverticulitis   . GERD (gastroesophageal reflux disease)   . History of kidney stones   . History of melanoma excision   . History of renal calculi   . 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 Denman George 04/30/2017  . Hyperlipidemia   . IBS (irritable bowel syndrome)   . Kidney stones    bilateral  . melanoma dx'd 2011   rt arm--surg only  . Menopause    sees Dr. Phineas Real  . Migraines   . Nephrolithiasis    left  . Neurogenic bladder   . Normal cardiac stress test 2012  . PONV (postoperative nausea and vomiting)   . Sepsis ([place]) 01/2016   related to infection caused by kidney stone blockage   . Tendonitis    right ankle  . Urinary urgency   . UTI (urinary tract infection)    started atb on 03-24-12   Outpatient Medications Prior to Visit  Medication Sig Dispense Refill  . acetaminophen (TYLENOL) 325 MG tablet Take 650 mg by mouth every 6 (six) hours as needed for mild pain or headache.    Marland Kitchen amoxicillin (AMOXIL) 500 MG tablet Take 2,000 mg by mouth See admin instructions. Takes 1 hour prior to dental procedures    . Ascorbic Acid (VITAMIN C) 1000 MG tablet Take 1,000 mg by mouth daily.    Marland Kitchen b complex vitamins tablet Take 1 tablet by mouth daily.    . Butalbital-APAP-Caffeine 50-300-40 MG CAPS Take 1 capsule by mouth 2 (two) times daily as needed (headache). (Patient taking differently: Take 1 capsule by mouth 2 (two) times daily as needed  (headache). ) 84 capsule 2  . CALCIUM CITRATE PO Take 1,000 mg by mouth every evening.    . Cranberry 250 MG CAPS Take 500 mg by mouth 2 (two) times daily.    . diphenhydrAMINE (BENADRYL) 25 mg capsule Take 25 mg by mouth at bedtime.    Marland Kitchen estropipate (OGEN) 0.75 MG tablet Take 1 tablet (0.75 mg total) by mouth daily. 90 tablet 4  . ezetimibe (ZETIA) 10 MG tablet Take 1 tablet (10 mg total) by mouth daily. 90 tablet 1  . Flaxseed, Linseed, (FLAXSEED OIL) 1200 MG CAPS Take 1,200 mg by mouth 2 (two) times daily.    . fluconazole (DIFLUCAN) 150 MG tablet Take 1 tablet (150 mg total) by mouth 2 (two) times a week. 12 tablet 5  . hydrochlorothiazide (HYDRODIURIL) 25 MG tablet Take 1 tablet (25 mg total) by mouth daily. 90 tablet 1  . LACTOBACILLUS PO Take 1 tablet by mouth daily.    Marland Kitchen loratadine (CLARITIN) 10 MG tablet Take 10 mg by mouth daily.     Marland Kitchen  medroxyPROGESTERone (PROVERA) 2.5 MG tablet Take 1 tablet (2.5 mg total) by mouth daily. 90 tablet 4  . Multiple Vitamins-Minerals (WOMENS 50+ MULTI VITAMIN/MIN PO) Take 1 tablet by mouth daily.    . naproxen sodium (ANAPROX) 220 MG tablet Take 220 mg by mouth 2 (two) times daily with a meal.     . Omega-3 Fatty Acids (FISH OIL) 1000 MG CAPS Take 1,000 mg by mouth daily.    Marland Kitchen omeprazole (PRILOSEC) 20 MG capsule Take 1 capsule (20 mg total) by mouth daily. 90 capsule 1  . potassium gluconate 595 MG TABS Take 1,190 mg by mouth every evening.     . propranolol (INDERAL) 40 MG tablet Take 1 tablet (40 mg total) by mouth daily. 90 tablet 1  . traMADol (ULTRAM) 50 MG tablet Take 1-2 tablets (50-100 mg total) by mouth every 6 (six) hours as needed for moderate pain. 60 tablet 2  . prochlorperazine (COMPAZINE) 10 MG tablet Take 10 mg by mouth every 6 (six) hours as needed for nausea or vomiting.    . nitrofurantoin, macrocrystal-monohydrate, (MACROBID) 100 MG capsule Take 1 capsule (100 mg total) by mouth 2 (two) times daily. (Patient not taking: Reported on  10/12/2017) 14 capsule 0   No facility-administered medications prior to visit.    Allergies  Allergen Reactions  . Levaquin [Levofloxacin In D5w] Anaphylaxis, Rash and Other (See Comments)    Hot, dizziness, diarrhea  . Pravastatin Sodium [Pravachol]     Joint pain(severe)  . Biaxin [Clarithromycin] Nausea And Vomiting  . Ciprofloxacin Nausea Only  . Clarithromycin Nausea Only  . Codeine Nausea And Vomiting  . Dust Mite Extract Other (See Comments)      Grass, trees also- cause Runny nose, sneezing (allergy symptoms)  . Septra [Sulfamethoxazole-Trimethoprim]     diarrhea   Social History   Tobacco Use  . Smoking status: Former Smoker    Packs/day: 0.25    Last attempt to quit: 12/20/1998    Years since quitting: 18.8  . Smokeless tobacco: Never Used  Substance Use Topics  . Alcohol use: No    Alcohol/week: 0.0 oz  . Drug use: No    Family History  Problem Relation Age of Onset  . Heart disease Mother   . Hypertension Mother   . Diabetes Mother   . Hypertension Father   . Leukemia Father   . Hypertension Sister   . Heart disease Sister   . Bladder Cancer Sister   . Stomach cancer Paternal Grandmother   . Colon cancer Neg Hx   . Esophageal cancer Neg Hx   . Rectal cancer Neg Hx      OBJECTIVE: Vitals:   10/12/17 0849  BP: 117/76  Pulse: 74  Temp: (!) 97.4 F (36.3 C)  TempSrc: Oral  Weight: 194 lb (88 kg)   Body mass index is 30.38 kg/m.  Physical Exam  Constitutional: She is oriented to person, place, and time and well-developed, well-nourished, and in no distress.  HENT:  Head: Normocephalic.  Right Ear: Ear canal normal. Tympanic membrane is scarred. A middle ear effusion is present.  Left Ear: Ear canal normal. Tympanic membrane is scarred. A middle ear effusion is present.  Nose: Mucosal edema present. Epistaxis (Friable erethematous tissue to left nare with blood clot to anterior nare) is observed. Right sinus exhibits frontal sinus tenderness.  Right sinus exhibits no maxillary sinus tenderness. Left sinus exhibits maxillary sinus tenderness. Left sinus exhibits no frontal sinus tenderness.  Mouth/Throat: Mucous membranes are  normal. No oral lesions. No dental abscesses. Posterior oropharyngeal erythema present.  Opacification noted to bilateral TMs noted to 11 o'clock mark on right TM and 1-2 o'clock mark on left TM.   Cardiovascular: Normal rate, regular rhythm and normal heart sounds.  Pulmonary/Chest: Effort normal and breath sounds normal.  Abdominal: Soft. She exhibits no distension. There is no tenderness.  Musculoskeletal: Normal range of motion. She exhibits no tenderness.  Lymphadenopathy:    She has no cervical adenopathy.  Neurological: She is alert and oriented to person, place, and time.  Skin: Skin is warm and dry. No rash noted.  Psychiatric: Mood, affect and judgment normal.  Vitals reviewed.  ASSESSMENT & PLAN:  Problem List Items Addressed This Visit      Respiratory   Chronic sinusitis    Previous nasal culture MRSA (S-clindamycin) from January 2018. She has taken bactrim several times in the past and only had bad diarrhea with it once that she admits could have been related to something different. I think we should try using Bactrim 1 DS tab BID x 6 weeks in conjunction with other adjuvant measures such as QD-BID nasal rinses, oral decongestants and continued antihistamine use. Advised nasal saline gel for nose bleeds to be used after the rinses to help maintain moisture to nasal tissue. Flonase I feel would not be a good choice d/t her frequent nose bleeds it is likely to make it worse.   Will have her return in 2 weeks to see how she is doing and check bmet. If this does not help her next step would be to consider post-treatment CT with re-eval for ENT and hopefully further sampling of purulent material for further testing. May be MRSA however alternatives could include pseudomonas or fungal etiology (she  frequently irrigates and does not change out her irrigation bottle as recommended). Several case reports I have read about ofloxacin opth gtts TID to nares to help manage biofilm formation that has been helpful in small samples of patients with recurrent MRSA sinusitis. She has had an allergy to Levaquin however and would like to hold on this option until after her cruise and if we need to reconsider nasal endoscopy. Theoretically would be best to start these gtts after she is cleaned out for management of biofilm formation.       Relevant Medications   fluconazole (DIFLUCAN) 150 MG tablet   sulfamethoxazole-trimethoprim (BACTRIM DS,SEPTRA DS) 800-160 MG tablet     Other   Nausea    Frequently gets nauseated with antibiotics - will refill her compazine x 1       Yeast infection    History of associated vaginal candidiasis with abx. Will send in rx for diflucan 150 mg weekly x 2 doses for her.       Relevant Medications   fluconazole (DIFLUCAN) 150 MG tablet   sulfamethoxazole-trimethoprim (BACTRIM DS,SEPTRA DS) 800-160 MG tablet     Janene Madeira, MSN, [facility]  10/12/2017  3:28 PM

## 2017-10-12 NOTE — Assessment & Plan Note (Signed)
Frequently gets nauseated with antibiotics - will refill her compazine x 1

## 2017-10-12 NOTE — Patient Instructions (Signed)
Please come back in 2 weeks for a follow up and preparation for some labs.   Continue with the daily sinus rinsing  Claritin-D (this will have a decongestant component for you to help make the rinses more effective)

## 2017-10-12 NOTE — Assessment & Plan Note (Signed)
History of associated vaginal candidiasis with abx. Will send in rx for diflucan 150 mg weekly x 2 doses for her.

## 2017-10-12 NOTE — Assessment & Plan Note (Signed)
Previous nasal culture MRSA (S-clindamycin) from January 2018. She has taken bactrim several times in the past and only had bad diarrhea with it once that she admits could have been related to something different. I think we should try using Bactrim 1 DS tab BID x 6 weeks in conjunction with other adjuvant measures such as QD-BID nasal rinses, oral decongestants and continued antihistamine use. Advised nasal saline gel for nose bleeds to be used after the rinses to help maintain moisture to nasal tissue. Flonase I feel would not be a good choice d/t her frequent nose bleeds it is likely to make it worse.   Will have her return in 2 weeks to see how she is doing and check bmet. If this does not help her next step would be to consider post-treatment CT with re-eval for ENT and hopefully further sampling of purulent material for further testing. May be MRSA however alternatives could include pseudomonas or fungal etiology (she frequently irrigates and does not change out her irrigation bottle as recommended). Several case reports I have read about ofloxacin opth gtts TID to nares to help manage biofilm formation that has been helpful in small samples of patients with recurrent MRSA sinusitis. She has had an allergy to Levaquin however and would like to hold on this option until after her cruise and if we need to reconsider nasal endoscopy. Theoretically would be best to start these gtts after she is cleaned out for management of biofilm formation.

## 2017-10-17 ENCOUNTER — Other Ambulatory Visit: Payer: Self-pay | Admitting: Infectious Diseases

## 2017-10-17 ENCOUNTER — Telehealth: Payer: Self-pay | Admitting: *Deleted

## 2017-10-17 DIAGNOSIS — G5601 Carpal tunnel syndrome, right upper limb: Secondary | ICD-10-CM | POA: Diagnosis not present

## 2017-10-17 DIAGNOSIS — G5602 Carpal tunnel syndrome, left upper limb: Secondary | ICD-10-CM | POA: Diagnosis not present

## 2017-10-17 DIAGNOSIS — M79641 Pain in right hand: Secondary | ICD-10-CM | POA: Diagnosis not present

## 2017-10-17 DIAGNOSIS — M79642 Pain in left hand: Secondary | ICD-10-CM | POA: Diagnosis not present

## 2017-10-17 MED ORDER — CLINDAMYCIN HCL 300 MG PO CAPS: 300.0000 mg | ORAL_CAPSULE | Freq: Three times a day (TID) | ORAL | 0 refills | Status: AC

## 2017-10-17 NOTE — Telephone Encounter (Signed)
Per [place] patient is aware.

## 2017-10-17 NOTE — Telephone Encounter (Signed)
Lets try Clindamycin 300 mg TID x 6 weeks. (Rx already entered).  Please give her precaution that this will likely cause diarrhea so probiotics / yogurt daily may help ward that off. If she is having more than 3-4 watery stools a day call and let us know. OK to try some imodium while she is on the cruise if this gives her trouble.    If she cannot tolerate this next step will be to get her back to see ENT for nasal endoscopy and sampling of fluid to see if any new information about bacteria (we discussed this in visit recently).

## 2017-10-17 NOTE — Telephone Encounter (Signed)
Patient called and advised that since starting the Septra she has itching , sores in her mouth and does not feel well. She has stopped and would like the provider to give her something else as she can not tolerate the Septra. Advised will let the provider know and someone will give her a call as soon as possible.

## 2017-10-24 ENCOUNTER — Encounter: Payer: Self-pay | Admitting: Infectious Diseases

## 2017-10-24 ENCOUNTER — Ambulatory Visit (INDEPENDENT_AMBULATORY_CARE_PROVIDER_SITE_OTHER): Payer: PPO | Admitting: Infectious Diseases

## 2017-10-24 ENCOUNTER — Ambulatory Visit: Payer: PPO

## 2017-10-24 VITALS — BP 121/77 | HR 71 | Temp 97.5°F | Ht 67.0 in | Wt 197.0 lb

## 2017-10-24 DIAGNOSIS — J320 Chronic maxillary sinusitis: Secondary | ICD-10-CM

## 2017-10-24 NOTE — Progress Notes (Signed)
Victoria Mosley  [birth year] [id number] PCP: Laurey Morale, MD  Referring Provider: Alysia Penna, MD   Reason for Visit: chronic recurrent left maxillary sinusitis, MRSA hx  Brief Narrative: Victoria Mosley is a 66 y.o. female with pmhx of chronic sinus infections for the last 2 years that is refractor to several different antibiotics and surgery. She is a patient of Dr. Janace Hoard with ENT since 2017. With review of office notes she has had several nasal endoscopic irrigations of her sinuses. She has underwent several nasal endoscopy cleanings - irritation to the left septum where she has frequent nosebleeds as well as perforation but sinuses have always been open at the time. Previous cx from this material has shown MRSA. Marland Kitchen   HPI:  Since last visit I tried to put her on longer-term bactrim for this problem however she called the office about a week into the therapy to report itching and intolerance. I changed her to Clindamycin 300 mg TID. Today Victoria Mosley is feeling a little bit better overall and frustrated she did not tolerate the bactrim because it offered her instant relief within the first 48 hours of taking it; however the itching/rash was 'unbearable'. Since starting the clindamycin she has felt a little better overall. She continues to deny headaches, sinus pain, fevers or dental pain. She does have some dull pressure over her left maxillary sinus. She has continued to use the [place] spray rinse and has not had any nosebleeds since last visit since starting the saline gel.    Patient Active Problem List   Diagnosis Date Noted  . Nausea 10/12/2017  . Yeast infection 10/12/2017  . Chronic sinusitis 09/28/2017  . OA (osteoarthritis) of hip 09/27/2016  . S/P total knee arthroplasty 12/19/2014  . Intractable back pain 12/19/2014  . Sciatica of left side 12/19/2014  . Migraines 12/19/2014  . DDD (degenerative disc disease), lumbar 12/19/2014  . Left-sided low back pain with left-sided sciatica    . Status post total right knee replacement   . OA (osteoarthritis) of knee 12/07/2014  . 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 Migraine with aura, intractable 08/06/2012  . Overactive bladder 07/31/2012  . Traumatic epidural hematoma ([place]) 07/12/2012  . IBS (irritable bowel syndrome) 07/26/2011  . OSTEOARTHRITIS 01/26/2010  . NEPHROLITHIASIS, HX OF 01/26/2010  . DIVERTICULITIS, COLON 11/02/2008  . Hyperlipidemia 02/25/2008  . COMMON MIGRAINE 10/17/2007  . ALLERGIC RHINITIS 06/19/2007  . GERD 06/19/2007   Review of Systems  Constitutional: Positive for malaise/fatigue. Negative for chills, diaphoresis and fever.  HENT: Positive for sore throat. Negative for congestion, ear discharge, ear pain, hearing loss, nosebleeds, sinus pain and tinnitus.   Eyes: Negative for blurred vision and photophobia.  Respiratory: Negative for cough and shortness of breath.   Cardiovascular: Negative for chest pain and leg swelling.  Gastrointestinal: Negative for abdominal pain and diarrhea.  Genitourinary: Negative for dysuria and urgency.  Skin: Negative for rash.  Neurological: Positive for headaches. Negative for dizziness.   Past Medical History:  Diagnosis Date  . 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 Adenomatous colon polyp 2004  . Arthritis   . Bilateral knee pain    sees Dr. Wynelle Link   . Bilateral renal cysts   . Chronic UTI (urinary tract infection)   . Complication of anesthesia   . Cough 11/30/14    cough since 11/20/14- now productine- green with  green mucus out of nose- no fever  . Diverticulitis   . GERD (gastroesophageal reflux disease)   . History of kidney stones   . History of melanoma excision   . History of renal calculi   . 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 Denman George 04/30/2017  . Hyperlipidemia   . IBS (irritable bowel syndrome)   . Kidney stones     bilateral  . melanoma dx'd 2011   rt arm--surg only  . Menopause    sees Dr. Phineas Real  . Migraines   . Nephrolithiasis    left  . Neurogenic bladder   . Normal cardiac stress test 2012  . PONV (postoperative nausea and vomiting)   . Sepsis ([place]) 01/2016   related to infection caused by kidney stone blockage   . Tendonitis    right ankle  . Urinary urgency   . UTI (urinary tract infection)    started atb on 03-24-12   Outpatient Medications Prior to Visit  Medication Sig Dispense Refill  . acetaminophen (TYLENOL) 325 MG tablet Take 650 mg by mouth every 6 (six) hours as needed for mild pain or headache.    Marland Kitchen amoxicillin (AMOXIL) 500 MG tablet Take 2,000 mg by mouth See admin instructions. Takes 1 hour prior to dental procedures    . Ascorbic Acid (VITAMIN C) 1000 MG tablet Take 1,000 mg by mouth daily.    Marland Kitchen b complex vitamins tablet Take 1 tablet by mouth daily.    . Butalbital-APAP-Caffeine 50-300-40 MG CAPS Take 1 capsule by mouth 2 (two) times daily as needed (headache). (Patient taking differently: Take 1 capsule by mouth 2 (two) times daily as needed (headache). ) 84 capsule 2  . CALCIUM CITRATE PO Take 1,000 mg by mouth every evening.    . clindamycin (CLEOCIN) 300 MG capsule Take 1 capsule (300 mg total) by mouth 3 (three) times daily. 126 capsule 0  . Cranberry 250 MG CAPS Take 500 mg by mouth 2 (two) times daily.    . diphenhydrAMINE (BENADRYL) 25 mg capsule Take 25 mg by mouth at bedtime.    Marland Kitchen estropipate (OGEN) 0.75 MG tablet Take 1 tablet (0.75 mg total) by mouth daily. 90 tablet 4  . ezetimibe (ZETIA) 10 MG tablet Take 1 tablet (10 mg total) by mouth daily. 90 tablet 1  . Flaxseed, Linseed, (FLAXSEED OIL) 1200 MG CAPS Take 1,200 mg by mouth 2 (two) times daily.    . fluconazole (DIFLUCAN) 150 MG tablet Take 1 tablet (150 mg total) by mouth 2 (two) times a week. 12 tablet 5  . fluconazole (DIFLUCAN) 150 MG tablet Take 1 tablet (150 mg total) by mouth once a week. 2  tablet 0  . hydrochlorothiazide (HYDRODIURIL) 25 MG tablet Take 1 tablet (25 mg total) by mouth daily. 90 tablet 1  . LACTOBACILLUS PO Take 1 tablet by mouth daily.    Marland Kitchen loratadine (CLARITIN) 10 MG tablet Take 10 mg by mouth daily.     . medroxyPROGESTERone (PROVERA) 2.5 MG tablet Take 1 tablet (2.5 mg total) by mouth daily. 90 tablet 4  . Multiple Vitamins-Minerals (WOMENS 50+ MULTI VITAMIN/MIN PO) Take 1 tablet by mouth daily.    . naproxen sodium (ANAPROX) 220 MG tablet Take 220 mg by mouth 2 (two) times daily with a meal.     . nitrofurantoin, macrocrystal-monohydrate, (MACROBID) 100 MG capsule Take 1 capsule (100 mg total) by mouth 2 (two) times daily. 14 capsule 0  .  Omega-3 Fatty Acids (FISH OIL) 1000 MG CAPS Take 1,000 mg by mouth daily.    Marland Kitchen omeprazole (PRILOSEC) 20 MG capsule Take 1 capsule (20 mg total) by mouth daily. 90 capsule 1  . potassium gluconate 595 MG TABS Take 1,190 mg by mouth every evening.     . prochlorperazine (COMPAZINE) 10 MG tablet Take 1 tablet (10 mg total) by mouth every 6 (six) hours as needed for nausea or vomiting. 30 tablet 0  . propranolol (INDERAL) 40 MG tablet Take 1 tablet (40 mg total) by mouth daily. 90 tablet 1  . traMADol (ULTRAM) 50 MG tablet Take 1-2 tablets (50-100 mg total) by mouth every 6 (six) hours as needed for moderate pain. 60 tablet 2   No facility-administered medications prior to visit.    Allergies  Allergen Reactions  . Levaquin [Levofloxacin In D5w] Anaphylaxis, Rash and Other (See Comments)    Hot, dizziness, diarrhea  . Pravastatin Sodium [Pravachol]     Joint pain(severe)  . Septra [Sulfamethoxazole-Trimethoprim] Anaphylaxis    diarrhea  . Biaxin [Clarithromycin] Nausea And Vomiting  . Ciprofloxacin Nausea Only  . Clarithromycin Nausea Only  . Codeine Nausea And Vomiting  . Dust Mite Extract Other (See Comments)      Grass, trees also- cause Runny nose, sneezing (allergy symptoms)   Social History   Tobacco Use  .  Smoking status: Former Smoker    Packs/day: 0.25    Last attempt to quit: 12/20/1998    Years since quitting: 18.8  . Smokeless tobacco: Never Used  Substance Use Topics  . Alcohol use: No    Alcohol/week: 0.0 oz  . Drug use: No   Family History  Problem Relation Age of Onset  . Heart disease Mother   . Hypertension Mother   . Diabetes Mother   . Hypertension Father   . Leukemia Father   . Hypertension Sister   . Heart disease Sister   . Bladder Cancer Sister   . Stomach cancer Paternal Grandmother   . Colon cancer Neg Hx   . Esophageal cancer Neg Hx   . Rectal cancer Neg Hx     OBJECTIVE: Vitals:   10/24/17 0843  BP: 121/77  Pulse: 71  Temp: (!) 97.5 F (36.4 C)  TempSrc: Oral  Weight: 197 lb (89.4 kg)  Height: 5\' 7"  (1.702 m)   Body mass index is 30.85 kg/m.  Physical Exam  Constitutional: She is well-developed, well-nourished, and in no distress.  HENT:  Mouth/Throat: Oropharynx is clear and moist.  Eyes: Conjunctivae are normal. Pupils are equal, round, and reactive to light.  Neck: Neck supple.   ASSESSMENT & PLAN:  Problem List Items Addressed This Visit      Respiratory   Chronic sinusitis    Tolerating clindamycin well. Will have her continue this for 6 weeks in addition to her antihistamine, decongestant and saline rinses to minimize bacterial burden/biofilm in nasal passages. Seems that her drainage is better. She is taking a course of prednisone rx'd by another provider and feels that this will help.   Will have her back 1 week after she completes her abx to see how she is feeling. Next step if this is not helpful for her would be to consult Dr. Janace Hoard again for repeat evaluation as we are working off old culture data that is almost a year old now - possible the isolate is resistant to clindamycin whereas it was not before. She is a little frustrated about having to go  back for evaluation in addition to my preference for her to be off abx x 2 weeks before  collection - discussed risks of prolonged antibiotic use, emergence of resistance and being she has so many adverse reactions/allergies to several medications it is best to get updated information. Also if there is an anatomic defect causing recurrent infections that will need correction to minimize future issues. Would still be interested in starting olfloxacin opth gtts TID in the future.         Other   RESOLVED: Sepsis ([place]) - Primary     Janene Madeira, MSN, [facility]  10/24/2017  3:46 PM

## 2017-10-24 NOTE — Patient Instructions (Signed)
Have a great time on your cruise!  Keep up with your saline rinses, antihistamines and decongestants.   Will see you back the week of February 4th for a follow up

## 2017-10-24 NOTE — Assessment & Plan Note (Addendum)
Tolerating clindamycin well. Will have her continue this for 6 weeks in addition to her antihistamine, decongestant and saline rinses to minimize bacterial burden/biofilm in nasal passages. Seems that her drainage is better. She is taking a course of prednisone rx'd by another provider and feels that this will help.   Will have her back 1 week after she completes her abx to see how she is feeling. Next step if this is not helpful for her would be to consult Dr. Janace Hoard again for repeat evaluation as we are working off old culture data that is almost a year old now - possible the isolate is resistant to clindamycin whereas it was not before. She is a little frustrated about having to go back for evaluation in addition to my preference for her to be off abx x 2 weeks before collection - discussed risks of prolonged antibiotic use, emergence of resistance and being she has so many adverse reactions/allergies to several medications it is best to get updated information. Also if there is an anatomic defect causing recurrent infections that will need correction to minimize future issues. Would still be interested in starting olfloxacin opth gtts TID in the future.

## 2017-11-16 ENCOUNTER — Ambulatory Visit: Payer: PPO

## 2017-11-19 ENCOUNTER — Ambulatory Visit (INDEPENDENT_AMBULATORY_CARE_PROVIDER_SITE_OTHER): Payer: PPO | Admitting: Family Medicine

## 2017-11-19 ENCOUNTER — Encounter: Payer: Self-pay | Admitting: Family Medicine

## 2017-11-19 VITALS — BP 116/78 | HR 78 | Temp 94.0°F | Ht 67.25 in | Wt 190.3 lb

## 2017-11-19 DIAGNOSIS — R300 Dysuria: Secondary | ICD-10-CM

## 2017-11-19 DIAGNOSIS — N390 Urinary tract infection, site not specified: Secondary | ICD-10-CM

## 2017-11-19 DIAGNOSIS — Z0001 Encounter for general adult medical examination with abnormal findings: Secondary | ICD-10-CM

## 2017-11-19 DIAGNOSIS — Z0000 Encounter for general adult medical examination without abnormal findings: Secondary | ICD-10-CM

## 2017-11-19 LAB — POCT URINALYSIS DIPSTICK
Bilirubin, UA: NEGATIVE
Blood, UA: NEGATIVE
Glucose, UA: NEGATIVE
Ketones, UA: NEGATIVE
Leukocytes, UA: NEGATIVE
Nitrite, UA: POSITIVE
Spec Grav, UA: >=1.03 — AB (ref 1.010–1.025)
Urobilinogen, UA: 0.2 E.U./dL
pH, UA: 6 (ref 5.0–8.0)

## 2017-11-19 LAB — CBC WITH DIFFERENTIAL/PLATELET
Basophils Absolute: 0 10*3/uL (ref 0.0–0.1)
Basophils Relative: 0.3 % (ref 0.0–3.0)
Eosinophils Absolute: 0.1 10*3/uL (ref 0.0–0.7)
Eosinophils Relative: 1.4 % (ref 0.0–5.0)
HCT: 48.4 % — ABNORMAL HIGH (ref 36.0–46.0)
Hemoglobin: 16.4 g/dL — ABNORMAL HIGH (ref 12.0–15.0)
Lymphocytes Relative: 29.6 % (ref 12.0–46.0)
Lymphs Abs: 2 10*3/uL (ref 0.7–4.0)
MCHC: 33.8 g/dL (ref 30.0–36.0)
MCV: 95 fl (ref 78.0–100.0)
Monocytes Absolute: 0.9 10*3/uL (ref 0.1–1.0)
Monocytes Relative: 13.4 % — ABNORMAL HIGH (ref 3.0–12.0)
Neutro Abs: 3.8 10*3/uL (ref 1.4–7.7)
Neutrophils Relative %: 55.3 % (ref 43.0–77.0)
Platelets: 233 10*3/uL (ref 150.0–400.0)
RBC: 5.1 Mil/uL (ref 3.87–5.11)
RDW: 14 % (ref 11.5–15.5)
WBC: 6.9 10*3/uL (ref 4.0–10.5)

## 2017-11-19 LAB — LIPID PANEL
Cholesterol: 200 mg/dL (ref 0–200)
HDL: 44.4 mg/dL (ref >39.00)
LDL Cholesterol: 115 mg/dL — ABNORMAL HIGH (ref 0–99)
NonHDL: 155.15
Total CHOL/HDL Ratio: 4
Triglycerides: 199 mg/dL — ABNORMAL HIGH (ref 0.0–149.0)
VLDL: 39.8 mg/dL (ref 0.0–40.0)

## 2017-11-19 LAB — BASIC METABOLIC PANEL
BUN: 23 mg/dL (ref 6–23)
CO2: 34 mEq/L — ABNORMAL HIGH (ref 19–32)
Calcium: 9.8 mg/dL (ref 8.4–10.5)
Chloride: 101 mEq/L (ref 96–112)
Creatinine, Ser: 0.6 mg/dL (ref 0.40–1.20)
GFR: 106.46 mL/min (ref >60.00)
Glucose, Bld: 97 mg/dL (ref 70–99)
Potassium: 4 mEq/L (ref 3.5–5.1)
Sodium: 142 mEq/L (ref 135–145)

## 2017-11-19 LAB — HEPATIC FUNCTION PANEL
ALT: 27 U/L (ref 0–35)
AST: 23 U/L (ref 0–37)
Albumin: 4.3 g/dL (ref 3.5–5.2)
Alkaline Phosphatase: 53 U/L (ref 39–117)
Bilirubin, Direct: 0.1 mg/dL (ref 0.0–0.3)
Total Bilirubin: 0.5 mg/dL (ref 0.2–1.2)
Total Protein: 6.8 g/dL (ref 6.0–8.3)

## 2017-11-19 LAB — TSH: TSH: 5.88 u[IU]/mL — ABNORMAL HIGH (ref 0.35–4.50)

## 2017-11-19 MED ORDER — EZETIMIBE 10 MG PO TABS: 10.0000 mg | ORAL_TABLET | Freq: Every day | ORAL | 3 refills | Status: DC

## 2017-11-19 MED ORDER — PREDNISONE 5 MG PO TABS: 5.0000 mg | ORAL_TABLET | Freq: Every day | ORAL | 2 refills | Status: DC

## 2017-11-19 MED ORDER — OMEPRAZOLE 20 MG PO CPDR: 20.0000 mg | DELAYED_RELEASE_CAPSULE | Freq: Every day | ORAL | 3 refills | Status: DC

## 2017-11-19 MED ORDER — PROPRANOLOL HCL 40 MG PO TABS: 40.0000 mg | ORAL_TABLET | Freq: Every day | ORAL | 3 refills | Status: DC

## 2017-11-19 MED ORDER — HYDROCHLOROTHIAZIDE 25 MG PO TABS: 25.0000 mg | ORAL_TABLET | Freq: Every day | ORAL | 3 refills | Status: DC

## 2017-11-19 MED ORDER — BUTALBITAL-APAP-CAFFEINE 50-300-40 MG PO CAPS: 1.0000 | ORAL_CAPSULE | Freq: Two times a day (BID) | ORAL | 5 refills | Status: DC | PRN

## 2017-11-19 MED ORDER — NITROFURANTOIN MONOHYD MACRO 100 MG PO CAPS: 100.0000 mg | ORAL_CAPSULE | Freq: Two times a day (BID) | ORAL | 0 refills | Status: DC

## 2017-11-19 NOTE — Progress Notes (Signed)
   Subjective:    Patient ID: Victoria Mosley, female    DOB: February 23, 1951, 67 y.o.   MRN: [id number]  HPI Here for a well exam. She has ongoing sinusitis symptoms and the Infectious Disease clinic has her on Clindamycin currently. Also she has had a few days of urgency and burning on urinations. Otherwise she is doing well.    Review of Systems  Constitutional: Negative.   HENT: Positive for congestion, postnasal drip and sinus pressure. Negative for ear pain, facial swelling, sinus pain and sore throat.   Eyes: Negative.   Respiratory: Negative.   Cardiovascular: Negative.   Gastrointestinal: Negative.   Genitourinary: Positive for dysuria, frequency and urgency. Negative for decreased urine volume, difficulty urinating, dyspareunia, enuresis, flank pain, hematuria and pelvic pain.  Musculoskeletal: Negative.   Skin: Negative.   Neurological: Negative.   Psychiatric/Behavioral: Negative.        Objective:   Physical Exam  Constitutional: She is oriented to person, place, and time. She appears well-developed and well-nourished. No distress.  HENT:  Head: Normocephalic and atraumatic.  Right Ear: External ear normal.  Left Ear: External ear normal.  Nose: Nose normal.  Mouth/Throat: Oropharynx is clear and moist. No oropharyngeal exudate.  Eyes: Conjunctivae and EOM are normal. Pupils are equal, round, and reactive to light. No scleral icterus.  Neck: Normal range of motion. Neck supple. No JVD present. No thyromegaly present.  Cardiovascular: Normal rate, regular rhythm, normal heart sounds and intact distal pulses. Exam reveals no gallop and no friction rub.  No murmur heard. Pulmonary/Chest: Effort normal and breath sounds normal. No respiratory distress. She has no wheezes. She has no rales. She exhibits no tenderness.  Abdominal: Soft. Bowel sounds are normal. She exhibits no distension and no mass. There is no tenderness. There is no rebound and no guarding.    Musculoskeletal: Normal range of motion. She exhibits no edema or tenderness.  Lymphadenopathy:    She has no cervical adenopathy.  Neurological: She is alert and oriented to person, place, and time. She has normal reflexes. No cranial nerve deficit. She exhibits normal muscle tone. Coordination normal.  Skin: Skin is warm and dry. No rash noted. No erythema.  Psychiatric: She has a normal mood and affect. Her behavior is normal. Judgment and thought content normal.          Assessment & Plan:  Well exam. We discussed diet and exercise. Get fasting labs. Treat the UTI with Macrobid and culture the sample.  Alysia Penna, MD

## 2017-11-21 DIAGNOSIS — H524 Presbyopia: Secondary | ICD-10-CM | POA: Diagnosis not present

## 2017-11-21 DIAGNOSIS — H5203 Hypermetropia, bilateral: Secondary | ICD-10-CM | POA: Diagnosis not present

## 2017-11-21 DIAGNOSIS — H2513 Age-related nuclear cataract, bilateral: Secondary | ICD-10-CM | POA: Diagnosis not present

## 2017-11-21 LAB — URINE CULTURE
MICRO NUMBER:: 90085606
SPECIMEN QUALITY:: ADEQUATE

## 2017-11-29 DIAGNOSIS — Z471 Aftercare following joint replacement surgery: Secondary | ICD-10-CM | POA: Diagnosis not present

## 2017-11-29 DIAGNOSIS — Z96642 Presence of left artificial hip joint: Secondary | ICD-10-CM | POA: Diagnosis not present

## 2017-11-29 DIAGNOSIS — M1612 Unilateral primary osteoarthritis, left hip: Secondary | ICD-10-CM | POA: Diagnosis not present

## 2017-12-03 ENCOUNTER — Telehealth: Payer: Self-pay

## 2017-12-03 MED ORDER — ESTRADIOL 0.5 MG PO TABS: 0.5000 mg | ORAL_TABLET | Freq: Every day | ORAL | 1 refills | Status: DC

## 2017-12-03 NOTE — Telephone Encounter (Signed)
Note received from pharmacy regarding Rx for Estropipate 0.75 mg.  "No longer available for [place] to order. Can you changed the Rx to something else? Thanks"

## 2017-12-03 NOTE — Telephone Encounter (Signed)
Spoke with patient and advised her. ?

## 2017-12-03 NOTE — Telephone Encounter (Signed)
Okay for estradiol 0.5 mg refill through next annual exam.  To continue on the Provera 2.5 mg daily along with this

## 2017-12-04 ENCOUNTER — Ambulatory Visit (INDEPENDENT_AMBULATORY_CARE_PROVIDER_SITE_OTHER): Payer: PPO | Admitting: Infectious Diseases

## 2017-12-04 ENCOUNTER — Encounter: Payer: Self-pay | Admitting: Infectious Diseases

## 2017-12-04 VITALS — BP 101/64 | HR 74 | Temp 98.4°F | Ht 67.0 in | Wt 194.0 lb

## 2017-12-04 DIAGNOSIS — K0889 Other specified disorders of teeth and supporting structures: Secondary | ICD-10-CM | POA: Diagnosis not present

## 2017-12-04 DIAGNOSIS — J320 Chronic maxillary sinusitis: Secondary | ICD-10-CM

## 2017-12-04 NOTE — Progress Notes (Signed)
Zeba Luby Steve  1950/11/26 [id number] PCP: Laurey Morale, MD  Referring Provider: Alysia Penna, MD   Reason for Visit: chronic recurrent left maxillary sinusitis, MRSA hx  Brief Narrative: Areesha Dehaven States is a 67 y.o. female with pmhx of chronic sinus infections for the last 2 years that is refractor to several different antibiotics and surgery. She is a patient of Dr. Janace Hoard with ENT since 2017. With review of office notes she has had several nasal endoscopic irrigations of her sinuses. She has underwent several nasal endoscopy cleanings - irritation to the left septum where she has frequent nosebleeds as well as perforation but sinuses have always been open at the time. Previous cx from this material has shown MRSA.   Patient Active Problem List   Diagnosis Date Noted  . Chronic maxillary sinusitis 09/28/2017  . OA (osteoarthritis) of hip 09/27/2016  . Dizziness 04/17/2016  . S/P total knee arthroplasty 12/19/2014  . Intractable back pain 12/19/2014  . Sciatica of left side 12/19/2014  . Migraines 12/19/2014  . DDD (degenerative disc disease), lumbar 12/19/2014  . Left-sided low back pain with left-sided sciatica   . Status post total right knee replacement   . OA (osteoarthritis) of knee 12/07/2014  . 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 Migraine with aura, intractable 08/06/2012  . Overactive bladder 07/31/2012  . Epistaxis 07/12/2012  . IBS (irritable bowel syndrome) 07/26/2011  . OSTEOARTHRITIS 01/26/2010  . NEPHROLITHIASIS, HX OF 01/26/2010  . DIVERTICULITIS, COLON 11/02/2008  . Hyperlipidemia 02/25/2008  . COMMON MIGRAINE 10/17/2007  . ALLERGIC RHINITIS 06/19/2007  . GERD 06/19/2007    HPI:  Jeannene Patella returns for follow up since starting on Clindamycin for her chronic sinusitis. She reports minimal improvement and change of drainage color from green to brown color but now since stopping  treatment she feels the same as prior to initiating antibiotics. She denies fevers or chills but does report fatigue, facial fullness, halitosis, teeth pain, more frequent headaches (which may be attributed partially to new tri-focal lenses) and continued large drainage both down her throat and out her nose. She has continued with nasal irrigation and other measures such as decongestants and antihistamines. She just does not feel well. She is wondering about what we can try next and is hopeful we can consider prednisone to help with symptoms (has requested this many times). Of note she was also treated recently with Macrobid for UTI symptoms of which have resolved.    Review of Systems  Constitutional: Positive for malaise/fatigue. Negative for chills, diaphoresis and fever.  HENT: Positive for congestion and sore throat. Negative for ear discharge, ear pain, hearing loss, nosebleeds, sinus pain and tinnitus.        Teeth pain and increased sensitivity   Eyes: Negative for blurred vision and photophobia.  Respiratory: Negative for cough and shortness of breath.   Cardiovascular: Negative for chest pain and leg swelling.  Gastrointestinal: Negative for abdominal pain and diarrhea.  Genitourinary: Negative for dysuria and urgency.  Skin: Negative for rash.  Neurological: Positive for headaches. Negative for dizziness.   Past Medical History:  Diagnosis Date  . 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 Adenomatous colon polyp 2004  . Arthritis   . Bilateral knee pain    sees Dr. Wynelle Link   . Bilateral renal cysts   . Chronic UTI (urinary tract infection)   . Complication of  anesthesia   . Cough 11/30/14    cough since 11/20/14- now productine- green with green mucus out of nose- no fever  . Diverticulitis   . GERD (gastroesophageal reflux disease)   . History of kidney stones   . History of melanoma excision   . History of renal calculi   . HPV in female 01/2014, 02/2015, 03/2016,  03/2017   See office note from Dr. Denman George 04/30/2017  . Hyperlipidemia   . IBS (irritable bowel syndrome)   . Kidney stones    bilateral  . melanoma dx'd 2011   rt arm--surg only  . Menopause    sees Dr. Phineas Real  . Migraines   . Nephrolithiasis    left  . Neurogenic bladder   . Normal cardiac stress test 2012  . PONV (postoperative nausea and vomiting)   . Sepsis ([place]) 01/2016   related to infection caused by kidney stone blockage   . Tendonitis    right ankle  . Urinary urgency   . UTI (urinary tract infection)    started atb on 03-24-12   Outpatient Medications Prior to Visit  Medication Sig Dispense Refill  . acetaminophen (TYLENOL) 325 MG tablet Take 650 mg by mouth every 6 (six) hours as needed for mild pain or headache.    Marland Kitchen amoxicillin (AMOXIL) 500 MG tablet Take 2,000 mg by mouth See admin instructions. Takes 1 hour prior to dental procedures    . Ascorbic Acid (VITAMIN C) 1000 MG tablet Take 1,000 mg by mouth daily.    Marland Kitchen b complex vitamins tablet Take 1 tablet by mouth daily.    . Butalbital-APAP-Caffeine 50-300-40 MG CAPS Take 1 capsule by mouth 2 (two) times daily as needed (headache). 60 capsule 5  . CALCIUM CITRATE PO Take 1,000 mg by mouth every evening.    . Cranberry 250 MG CAPS Take 500 mg by mouth 2 (two) times daily.    . diphenhydrAMINE (BENADRYL) 25 mg capsule Take 25 mg by mouth at bedtime.    Marland Kitchen estradiol (ESTRACE) 0.5 MG tablet Take 1 tablet (0.5 mg total) by mouth daily. 90 tablet 1  . estropipate (OGEN) 0.75 MG tablet Take 1 tablet (0.75 mg total) by mouth daily. 90 tablet 4  . ESTROPIPATE PO Take 2.5 mg by mouth.    . ezetimibe (ZETIA) 10 MG tablet Take 1 tablet (10 mg total) by mouth daily. 90 tablet 3  . Flaxseed, Linseed, (FLAXSEED OIL) 1200 MG CAPS Take 1,200 mg by mouth 2 (two) times daily.    . fluconazole (DIFLUCAN) 150 MG tablet Take 1 tablet (150 mg total) by mouth 2 (two) times a week. 12 tablet 5  . fluconazole (DIFLUCAN) 150 MG tablet Take 1  tablet (150 mg total) by mouth once a week. 2 tablet 0  . hydrochlorothiazide (HYDRODIURIL) 25 MG tablet Take 1 tablet (25 mg total) by mouth daily. 90 tablet 3  . LACTOBACILLUS PO Take 1 tablet by mouth daily.    Marland Kitchen loratadine-pseudoephedrine (CLARITIN-D 24-HOUR) 10-240 MG 24 hr tablet Take 1 tablet by mouth daily.    . medroxyPROGESTERone (PROVERA) 2.5 MG tablet Take 1 tablet (2.5 mg total) by mouth daily. 90 tablet 4  . Multiple Vitamins-Minerals (WOMENS 50+ MULTI VITAMIN/MIN PO) Take 1 tablet by mouth daily.    . naproxen sodium (ANAPROX) 220 MG tablet Take 220 mg by mouth 2 (two) times daily with a meal.     . nitrofurantoin, macrocrystal-monohydrate, (MACROBID) 100 MG capsule Take 1 capsule (100 mg total)  by mouth 2 (two) times daily. 14 capsule 0  . nitrofurantoin, macrocrystal-monohydrate, (MACROBID) 100 MG capsule Take 1 capsule (100 mg total) by mouth 2 (two) times daily. 14 capsule 0  . Omega-3 Fatty Acids (FISH OIL) 1000 MG CAPS Take 1,000 mg by mouth daily.    Marland Kitchen omeprazole (PRILOSEC) 20 MG capsule Take 1 capsule (20 mg total) by mouth daily. 90 capsule 3  . potassium gluconate 595 MG TABS Take 1,190 mg by mouth every evening.     . predniSONE (DELTASONE) 5 MG tablet Take 1 tablet (5 mg total) by mouth daily with breakfast. 30 tablet 2  . prochlorperazine (COMPAZINE) 10 MG tablet Take 1 tablet (10 mg total) by mouth every 6 (six) hours as needed for nausea or vomiting. 30 tablet 0  . propranolol (INDERAL) 40 MG tablet Take 1 tablet (40 mg total) by mouth daily. 90 tablet 3  . traMADol (ULTRAM) 50 MG tablet Take 1-2 tablets (50-100 mg total) by mouth every 6 (six) hours as needed for moderate pain. 60 tablet 2   No facility-administered medications prior to visit.    Allergies  Allergen Reactions  . Levaquin [Levofloxacin In D5w] Anaphylaxis, Rash and Other (See Comments)    Hot, dizziness, diarrhea  . Pravastatin Sodium [Pravachol]     Joint pain(severe)  . Septra  [Sulfamethoxazole-Trimethoprim] Anaphylaxis    diarrhea  . Biaxin [Clarithromycin] Nausea And Vomiting  . Ciprofloxacin Nausea Only  . Clarithromycin Nausea Only  . Codeine Nausea And Vomiting  . Dust Mite Extract Other (See Comments)      Grass, trees also- cause Runny nose, sneezing (allergy symptoms)   Social History   Tobacco Use  . Smoking status: Former Smoker    Packs/day: 0.25    Last attempt to quit: 12/20/1998    Years since quitting: 18.9  . Smokeless tobacco: Never Used  Substance Use Topics  . Alcohol use: No    Alcohol/week: 0.0 oz  . Drug use: No   Family History  Problem Relation Age of Onset  . Heart disease Mother   . Hypertension Mother   . Diabetes Mother   . Hypertension Father   . Leukemia Father   . Hypertension Sister   . Heart disease Sister   . Bladder Cancer Sister   . Stomach cancer Paternal Grandmother   . Colon cancer Neg Hx   . Esophageal cancer Neg Hx   . Rectal cancer Neg Hx     OBJECTIVE: Vitals:   12/04/17 0915  BP: 101/64  Pulse: 74  Temp: 98.4 F (36.9 C)  TempSrc: Oral  Weight: 194 lb (88 kg)  Height: 5\' 7"  (1.702 m)   Body mass index is 30.38 kg/m.  Physical Exam  Constitutional: She is oriented to person, place, and time and well-developed, well-nourished, and in no distress.  Joined by her husband today.   HENT:  Nose: Left sinus exhibits maxillary sinus tenderness.  Mouth/Throat: Mucous membranes are normal. No oral lesions. No dental abscesses. Posterior oropharyngeal erythema present.  Mild swelling underneath eyes L>R. Small bloody scab noted to left nare c/w recent nosebleeds.   Eyes: Conjunctivae are normal. Pupils are equal, round, and reactive to light.  Neck: Neck supple.  Cardiovascular: Normal rate, regular rhythm and normal heart sounds.  Pulmonary/Chest: Effort normal and breath sounds normal.  Abdominal: Soft. She exhibits no distension. There is no tenderness.  Musculoskeletal: Normal range of  motion. She exhibits no tenderness.  Lymphadenopathy:    She has no  cervical adenopathy.  Neurological: She is alert and oriented to person, place, and time.  Skin: Skin is warm and dry. No rash noted.  Psychiatric: Mood, affect and judgment normal.  Vitals reviewed.  ASSESSMENT & PLAN:  Problem List Items Addressed This Visit      Respiratory   Chronic maxillary sinusitis - Primary    Unfortunately she failed prolonged clindamycin therapy and her symptoms have returned. I will obtain a repeat maxillofacial CT to compare to scans obtained 2 years prior to reassess for anatomic defect and post-treatment evaluation of sinus. We also discussed evaluation of dental pain with panorex to closely assess for possibility of abscess or contribution of an alternative dental process for her chronic sinusitis.   I think it is important that she schedule an appointment with her ENT provider and discuss repeat nasal endoscopy with repeat culture (aerobic/anaerobic and fungal) to isolate bacteria and determine sensitivities. She has a history of recovery of staph aureus which was sensitive to clindamycin a year ago; it is possible there is inducible clindamycin resistance now. Unfortunately she has side effects to many antibiotics and we discussed at length how they are not benign therefore it is important to be as precise with treatment as possible. Best chance at recovering true pathogen will be to stop all antibiotics for 10-14 days prior to culture if possible,.   Although she feels symptomatically better on prednisone we also discussed how this is not a curative medicine for her condition and only offers a 'bandaid' affect to palliate some of the congesting symptoms. It is also not a good option for a long term medication with side effects.   She will return after further discussion with ENT and repeat culture of sinus drainage and CT scan.       Relevant Orders   CT MAXILLOFACIAL WO CONTRAST    Other  Visit Diagnoses    Pain, dental       Relevant Orders   DG Orthopantogram     Janene Madeira, MSN, [facility]  12/04/2017  9:27 AM d

## 2017-12-04 NOTE — Assessment & Plan Note (Addendum)
Unfortunately she failed prolonged clindamycin therapy and her symptoms have returned. I will obtain a repeat maxillofacial CT to compare to scans obtained 2 years prior to reassess for anatomic defect and post-treatment evaluation of sinus. We also discussed evaluation of dental pain with panorex to closely assess for possibility of abscess or contribution of an alternative dental process for her chronic sinusitis.   I think it is important that she schedule an appointment with her ENT provider and discuss repeat nasal endoscopy with repeat culture (aerobic/anaerobic and fungal) to isolate bacteria and determine sensitivities. She has a history of recovery of staph aureus which was sensitive to clindamycin a year ago; it is possible there is inducible clindamycin resistance now. Unfortunately she has side effects to many antibiotics and we discussed at length how they are not benign therefore it is important to be as precise with treatment as possible. Best chance at recovering true pathogen will be to stop all antibiotics for 10-14 days prior to culture if possible,.   Although she feels symptomatically better on prednisone we also discussed how this is not a curative medicine for her condition and only offers a 'bandaid' affect to palliate some of the congesting symptoms. It is also not a good option for a long term medication with side effects.   She will return after further discussion with ENT and repeat culture of sinus drainage and CT scan.

## 2017-12-04 NOTE — Patient Instructions (Signed)
Next steps to see if we can cure this -  1- CT scan of your sinuses  2- Closer look at possibility for dental issue contributing 3- Please make an appointment with your ENT doctor to re-do nasal endoscopy to get a more current sample of what is causing the infection. This is very important to target the particular issue as we discussed.   Will have you back here for follow up to determine next appropriate treatment steps and what antibiotic we need.

## 2017-12-06 ENCOUNTER — Ambulatory Visit (HOSPITAL_COMMUNITY)
Admission: RE | Admit: 2017-12-06 | Discharge: 2017-12-06 | Disposition: A | Discharge status: Home / Self Care | Payer: PPO | Source: Ambulatory Visit | Attending: Infectious Diseases | Admitting: Infectious Diseases

## 2017-12-06 ENCOUNTER — Telehealth: Payer: Self-pay

## 2017-12-06 ENCOUNTER — Telehealth: Payer: Self-pay | Admitting: Infectious Diseases

## 2017-12-06 DIAGNOSIS — J320 Chronic maxillary sinusitis: Secondary | ICD-10-CM | POA: Diagnosis not present

## 2017-12-06 DIAGNOSIS — J322 Chronic ethmoidal sinusitis: Secondary | ICD-10-CM | POA: Insufficient documentation

## 2017-12-06 NOTE — Telephone Encounter (Addendum)
Called to report CT scan results with maxillary sinusitis and mild ethmoid sinusitis despite prolonged treatment with Clindamycin for previous staph aureus culture results. She went to her dentist for panorex to evaluate for tooth-related cause of her frequent/recurrent sinus infections. She will have her dentist send results to our clinic. She also has an appointment with Dr. Janace Hoard tomorrow at 2:30 for follow up.   Janene Madeira, NP

## 2017-12-07 DIAGNOSIS — J320 Chronic maxillary sinusitis: Secondary | ICD-10-CM | POA: Diagnosis not present

## 2017-12-08 DIAGNOSIS — J320 Chronic maxillary sinusitis: Secondary | ICD-10-CM | POA: Diagnosis not present

## 2017-12-13 ENCOUNTER — Telehealth: Payer: Self-pay | Admitting: Infectious Diseases

## 2017-12-13 DIAGNOSIS — Z452 Encounter for adjustment and management of vascular access device: Secondary | ICD-10-CM

## 2017-12-13 NOTE — Telephone Encounter (Signed)
Called to discuss plan for treating her sinusitis. Requested to call back and discuss.   Her recent sinus culture results revealing heavy pseudomonas which explains why the clindamycin did not work. The only option for treatment will be IV with an antibiotic called Cefepime since she has severe allergy to Levaquin that resulted with ED visit and IV steroids.

## 2017-12-17 ENCOUNTER — Telehealth: Payer: Self-pay

## 2017-12-17 NOTE — Telephone Encounter (Signed)
Please call patient.  She walked into clinic.  I did into inform her of your response to phone call because I knew she would have additional questions.     Laverle Patter, RN

## 2017-12-17 NOTE — Telephone Encounter (Signed)
Please call cell number : [phone]  Laverle Patter, RN

## 2017-12-17 NOTE — Telephone Encounter (Signed)
Called patient regarding culture results and plan - she would like to consider trying the ciprofloxacin for prolonged treatment course for pseudomonas chronic sinusitis as long as OK to take compazine for nausea/GI upset. Will review medication list tomorrow to ensure no r/f QTc prolongation with all these medications and call her back with more definitive plan. Otherwise should she want to pursue this we would have to consider IV therapy with Cefepime. She agrees and hopes to avoid the IV.

## 2017-12-18 MED ORDER — CIPROFLOXACIN HCL 500 MG PO TABS: 500.0000 mg | ORAL_TABLET | Freq: Two times a day (BID) | ORAL | 0 refills | Status: DC

## 2017-12-18 NOTE — Telephone Encounter (Signed)
I called pharmacy - patient discussed with me yesterday that Levaquin she reported had more severe side effects however she tolerated the cipro in the past with only GI upset/nausea. They will dispense.   Thanks

## 2017-12-18 NOTE — Telephone Encounter (Signed)
Thank you. I spoke with Victoria Mosley yesterday about plan for treatment going forward.   Would you please call her to let her know I will send in her ciprofloxacin to her pharmacy and would like to see her in clinic in 2 weeks for follow up please.   I do NOT want her to take prednisone with this as it can increased risk for side effects   Compazine is OK to take - we can refill this if she needs more   Continue irrigating sinuses 1-2 times a day with sterile saline rinse.   Would also add allergy nasal spray after irrigation twice daily.   All of this is reinforcement of previous conversations I have had with her so she she should not have any questions for you.

## 2017-12-18 NOTE — Telephone Encounter (Signed)
CD of X-rays placed in your in box.

## 2017-12-18 NOTE — Addendum Note (Signed)
Addended by: Woodbury Callas on: 12/18/2017 02:19 PM   Modules accepted: Orders

## 2017-12-18 NOTE — Telephone Encounter (Signed)
Patient pharmacy called to advise that she has a allergy to cipro and wanted to make sure we really meant to send in Rx for the medication. Call back is [phone]

## 2017-12-19 NOTE — Telephone Encounter (Signed)
Patient called for response, is agreeable to plan as laid out by [facility].  She is off prednisone, will continue to rinse her sinuses and will use afrin. She will take compazine as needed, will take cipro with food.  Her only other concern is the potential for diarrhea. Is it ok to use immodium as needed? Landis Gandy, RN

## 2017-12-19 NOTE — Telephone Encounter (Signed)
There is a higher risk of cdiff with cipro and she has been on SO many regimens recently I worry greatly about this for her. I would prefer to have her call if she has more than 3 watery bowel movements or fevers with diarrhea and avoid imodium.   I would see if she can do Flonase or Nasocort nasal spray and limit Afrin use - that is likely to cause rebound sinus problems if used longer than 3 days.

## 2017-12-19 NOTE — Telephone Encounter (Signed)
Relayed instructions to patient. Thanks!

## 2017-12-21 NOTE — Telephone Encounter (Signed)
Patient left disc for review.   Laverle Patter, RN

## 2017-12-24 DIAGNOSIS — Z1231 Encounter for screening mammogram for malignant neoplasm of breast: Secondary | ICD-10-CM | POA: Diagnosis not present

## 2018-01-01 ENCOUNTER — Encounter: Payer: Self-pay | Admitting: Infectious Diseases

## 2018-01-01 ENCOUNTER — Ambulatory Visit (INDEPENDENT_AMBULATORY_CARE_PROVIDER_SITE_OTHER): Payer: PPO | Admitting: Infectious Diseases

## 2018-01-01 VITALS — BP 116/81 | HR 67 | Temp 97.9°F | Wt 195.0 lb

## 2018-01-01 DIAGNOSIS — G8929 Other chronic pain: Secondary | ICD-10-CM | POA: Diagnosis not present

## 2018-01-01 DIAGNOSIS — G44209 Tension-type headache, unspecified, not intractable: Secondary | ICD-10-CM | POA: Diagnosis not present

## 2018-01-01 DIAGNOSIS — J320 Chronic maxillary sinusitis: Secondary | ICD-10-CM | POA: Diagnosis not present

## 2018-01-01 DIAGNOSIS — M25572 Pain in left ankle and joints of left foot: Secondary | ICD-10-CM

## 2018-01-01 MED ORDER — CIPROFLOXACIN HCL 500 MG PO TABS: 500.0000 mg | ORAL_TABLET | Freq: Two times a day (BID) | ORAL | 0 refills | Status: AC

## 2018-01-01 NOTE — Progress Notes (Signed)
Victoria Mosley  10/06/1951 [id number] PCP: Laurey Morale, MD  Referring Provider: Alysia Penna, MD   Chief Complaint  Patient presents with  . Follow-up    chronic sinusitus   . Ankle Pain  . Headache   Patient Active Problem List   Diagnosis Date Noted  . Left ankle pain 01/02/2018  . Chronic maxillary sinusitis 09/28/2017  . OA (osteoarthritis) of hip 09/27/2016  . Dizziness 04/17/2016  . S/P total knee arthroplasty 12/19/2014  . Intractable back pain 12/19/2014  . Sciatica of left side 12/19/2014  . Migraines 12/19/2014  . DDD (degenerative disc disease), lumbar 12/19/2014  . Left-sided low back pain with left-sided sciatica   . Status post total right knee replacement   . OA (osteoarthritis) of knee 12/07/2014  . 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 Migraine with aura, intractable 08/06/2012  . Overactive bladder 07/31/2012  . Epistaxis 07/12/2012  . IBS (irritable bowel syndrome) 07/26/2011  . OSTEOARTHRITIS 01/26/2010  . NEPHROLITHIASIS, HX OF 01/26/2010  . DIVERTICULITIS, COLON 11/02/2008  . Hyperlipidemia 02/25/2008  . Headache 10/17/2007  . ALLERGIC RHINITIS 06/19/2007  . GERD 06/19/2007   Subjective:  Brief Narrative: Victoria Mosley is a 67 y.o. female with pmhx of chronic sinus infections for the last 2 years that is refractor to several different antibiotics and surgery. She is a patient of Dr. Janace Hoard with ENT since 2017. She has underwent several nasal endoscopy cleanings - irritation to the left septum where she has frequent nosebleeds as well as perforation but sinuses have always been open at the time. Previous cx from this material has shown MSSA. (S-clindamycin, bactrim). Failed 6w clindamycin therapy and I had her return to Dr. Janace Hoard to recollect sample after post-tx CT showed opacification and thickening of the left maxillary sinus. Heavy pseudomonas growth noted  (sensitive). She has also had dental xrays that do not reveal a defect that would continue to cause sinus issues.   HPI:  Since our last office visit we have spoke on the phone several times in coordination of her care and treatment of pseudomonal sinus infection. She has completed nearly 2 weeks of her ciprofloxacin therapy and feels that her sinus symptoms are much improved; however the antibiotic overall makes her feel "lousy." I have advised her to stop taking her prednisone (which she takes intermittently and frequently based on our previous conversations) to reduce frequency of side effects of tendonitis with anticipation of prolonged course of treatment. She has also stopped her Aleve and calcium containing multivitamins as she read they can increase side effects as well. No fevers, chills. Some occasional loose bowel movements but does not go every day. She does have some nausea which is controlled with the compazine. She has had some worsening left lateral ankle pain and daily headaches across the front of her head and down her neck. She describes her ankle pain to be more from chronic inner rotation of ankle with walking and this is improved with icing the area. She has also taken some tylenol with moderate effect. Headaches are mostly daily and improved with Excedrin. Does not describe them to be "migraine quality".  She has continued to use daily saline irrigation and Flonase BID.   Review of Systems  Constitutional: Positive for malaise/fatigue. Negative for chills, diaphoresis and fever.  HENT: Negative for congestion, sinus pain, sore throat and tinnitus.   Eyes: Negative for blurred vision and photophobia.  Respiratory: Negative for cough and shortness of breath.   Cardiovascular: Negative for chest pain and leg swelling.  Gastrointestinal: Negative for abdominal pain and diarrhea.  Genitourinary: Negative for dysuria and urgency.  Skin: Negative for rash.  Neurological: Positive for  headaches. Negative for dizziness.   Past Medical History:  Diagnosis Date  . 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 Adenomatous colon polyp 2004  . Arthritis   . Bilateral knee pain    sees Dr. Wynelle Link   . Bilateral renal cysts   . Chronic UTI (urinary tract infection)   . Complication of anesthesia   . Cough 11/30/14    cough since 11/20/14- now productine- green with green mucus out of nose- no fever  . Diverticulitis   . GERD (gastroesophageal reflux disease)   . History of kidney stones   . History of melanoma excision   . History of renal calculi   . 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 Denman George 04/30/2017  . Hyperlipidemia   . IBS (irritable bowel syndrome)   . Kidney stones    bilateral  . melanoma dx'd 2011   rt arm--surg only  . Menopause    sees Dr. Phineas Real  . Migraines   . Nephrolithiasis    left  . Neurogenic bladder   . Normal cardiac stress test 2012  . PONV (postoperative nausea and vomiting)   . Sepsis ([place]) 01/2016   related to infection caused by kidney stone blockage   . Tendonitis    right ankle  . Urinary urgency   . UTI (urinary tract infection)    started atb on 03-24-12   Outpatient Medications Prior to Visit  Medication Sig Dispense Refill  . acetaminophen (TYLENOL) 325 MG tablet Take 650 mg by mouth every 6 (six) hours as needed for mild pain or headache.    . Ascorbic Acid (VITAMIN C) 1000 MG tablet Take 1,000 mg by mouth daily.    Marland Kitchen b complex vitamins tablet Take 1 tablet by mouth daily.    . Butalbital-APAP-Caffeine 50-300-40 MG CAPS Take 1 capsule by mouth 2 (two) times daily as needed (headache). 60 capsule 5  . CALCIUM CITRATE PO Take 1,000 mg by mouth every evening.    . Cranberry 250 MG CAPS Take 500 mg by mouth 2 (two) times daily.    . diphenhydrAMINE (BENADRYL) 25 mg capsule Take 25 mg by mouth at bedtime.    Marland Kitchen estradiol (ESTRACE) 0.5 MG tablet Take 1 tablet (0.5 mg total) by mouth  daily. 90 tablet 1  . estropipate (OGEN) 0.75 MG tablet Take 1 tablet (0.75 mg total) by mouth daily. 90 tablet 4  . ESTROPIPATE PO Take 2.5 mg by mouth.    . ezetimibe (ZETIA) 10 MG tablet Take 1 tablet (10 mg total) by mouth daily. 90 tablet 3  . Flaxseed, Linseed, (FLAXSEED OIL) 1200 MG CAPS Take 1,200 mg by mouth 2 (two) times daily.    . fluconazole (DIFLUCAN) 150 MG tablet Take 1 tablet (150 mg total) by mouth 2 (two) times a week. 12 tablet 5  . fluconazole (DIFLUCAN) 150 MG tablet Take 1 tablet (150 mg total) by mouth once a week. 2 tablet 0  . hydrochlorothiazide (HYDRODIURIL) 25 MG tablet Take 1 tablet (25 mg total) by mouth daily. 90 tablet 3  . LACTOBACILLUS PO Take 1 tablet by mouth daily.    Marland Kitchen loratadine-pseudoephedrine (CLARITIN-D 24-HOUR) 10-240 MG 24  hr tablet Take 1 tablet by mouth daily.    . medroxyPROGESTERone (PROVERA) 2.5 MG tablet Take 1 tablet (2.5 mg total) by mouth daily. 90 tablet 4  . Multiple Vitamins-Minerals (WOMENS 50+ MULTI VITAMIN/MIN PO) Take 1 tablet by mouth daily.    . naproxen sodium (ANAPROX) 220 MG tablet Take 220 mg by mouth 2 (two) times daily with a meal.     . Omega-3 Fatty Acids (FISH OIL) 1000 MG CAPS Take 1,000 mg by mouth daily.    Marland Kitchen omeprazole (PRILOSEC) 20 MG capsule Take 1 capsule (20 mg total) by mouth daily. 90 capsule 3  . potassium gluconate 595 MG TABS Take 1,190 mg by mouth every evening.     . predniSONE (DELTASONE) 5 MG tablet Take 1 tablet (5 mg total) by mouth daily with breakfast. 30 tablet 2  . prochlorperazine (COMPAZINE) 10 MG tablet Take 1 tablet (10 mg total) by mouth every 6 (six) hours as needed for nausea or vomiting. 30 tablet 0  . propranolol (INDERAL) 40 MG tablet Take 1 tablet (40 mg total) by mouth daily. 90 tablet 3  . traMADol (ULTRAM) 50 MG tablet Take 1-2 tablets (50-100 mg total) by mouth every 6 (six) hours as needed for moderate pain. 60 tablet 2  . ciprofloxacin (CIPRO) 500 MG tablet Take 1 tablet (500 mg total)  by mouth 2 (two) times daily for 21 days. 42 tablet 0   No facility-administered medications prior to visit.    Allergies  Allergen Reactions  . Levaquin [Levofloxacin In D5w] Anaphylaxis, Rash and Other (See Comments)    Hot, dizziness, diarrhea  . Pravastatin Sodium [Pravachol]     Joint pain(severe)  . Septra [Sulfamethoxazole-Trimethoprim] Anaphylaxis    diarrhea  . Biaxin [Clarithromycin] Nausea And Vomiting  . Ciprofloxacin Nausea Only  . Clarithromycin Nausea Only  . Codeine Nausea And Vomiting  . Dust Mite Extract Other (See Comments)      Grass, trees also- cause Runny nose, sneezing (allergy symptoms)   Social History   Tobacco Use  . Smoking status: Former Smoker    Packs/day: 0.25    Last attempt to quit: 12/20/1998    Years since quitting: 19.0  . Smokeless tobacco: Never Used  Substance Use Topics  . Alcohol use: No    Alcohol/week: 0.0 oz  . Drug use: No    Objective:   Vitals:   01/01/18 0930  BP: 116/81  Pulse: 67  Temp: 97.9 F (36.6 C)  TempSrc: Oral  Weight: 195 lb (88.5 kg)   Body mass index is 30.54 kg/m.  Physical Exam  Constitutional: She is oriented to person, place, and time and well-developed, well-nourished, and in no distress.  HENT:  Mouth/Throat: Oropharynx is clear and moist. No oral lesions. No dental abscesses.  Eyes: Conjunctivae are normal. Pupils are equal, round, and reactive to light.  Eyes appear less swollen   Neck: Neck supple.  Cardiovascular: Normal rate, regular rhythm and normal heart sounds.  Pulmonary/Chest: Effort normal and breath sounds normal.  Abdominal: Soft. She exhibits no distension. There is no tenderness.  Musculoskeletal: Normal range of motion. She exhibits no tenderness.       Left ankle: She exhibits swelling (left lateral ankle ). She exhibits normal range of motion. No tenderness. No CF ligament and no posterior TFL tenderness found. Achilles tendon normal.  Lymphadenopathy:    She has no  cervical adenopathy.  Neurological: She is alert and oriented to person, place, and time.  Skin: Skin  is warm and dry. No rash noted.  Psychiatric: Mood, affect and judgment normal.   ASSESSMENT & PLAN:  Problem List Items Addressed This Visit      Respiratory   Chronic maxillary sinusitis - Primary    She is fortunately tolerating the Ciprofloxacin well with only minor nausea and some infrequent loose bowel movements. Sinusitis symptoms are much improved. I will extend out her treatment by 3 more weeks with recommended 6 week course for chronic sinusitis. Discussed that often with staph and pseudomonas the potential for biofilm formation is what is most difficult to treat and often is responsible for chronic relapsing infections. Will have her continue nasal irrigation (counseled again on proper use with distilled water or sterile water and proper cleaning of vessels). Will also have her continue BID Flonase to improve mucociliary function - she has not had any worsened nosebleeds since adding this back.  She will return in 6 weeks to reassess.       Relevant Medications   ciprofloxacin (CIPRO) 500 MG tablet     Other   Left ankle pain    She has no tenderness to her achilles tendon and negative squeeze test of ankle. She does have a significant inner rotation of the left ankle with observation of her gait. Mild swelling surrounding the left lateral malleolus. This seems to be exacerbation of a chronic problem and not acute tendonitis r/t FQ. Advised to continue with conservative measures with rest and ice with tylenol as needed for pain.       Headache    Description c/w tension headaches - Discussed that these are likely occurring due to sudden withdrawal of NSAIDs and prednisone. Would anticipate these to get better with time. Advised to stick with tylenol and keeping well hydrated.         Janene Madeira, MSN, [facility] [place]  [facility]  01/02/2018  11:34 AM

## 2018-01-01 NOTE — Patient Instructions (Addendum)
Will continue your ciprofloxacin for 6 weeks.   I sent in 3 more weeks for cipro  Will see you back in 6 weeks - if you are feeling much better call with an update and can cancel this.

## 2018-01-02 DIAGNOSIS — M25572 Pain in left ankle and joints of left foot: Secondary | ICD-10-CM | POA: Insufficient documentation

## 2018-01-02 NOTE — Assessment & Plan Note (Signed)
She is fortunately tolerating the Ciprofloxacin well with only minor nausea and some infrequent loose bowel movements. Sinusitis symptoms are much improved. I will extend out her treatment by 3 more weeks with recommended 6 week course for chronic sinusitis. Discussed that often with staph and pseudomonas the potential for biofilm formation is what is most difficult to treat and often is responsible for chronic relapsing infections. Will have her continue nasal irrigation (counseled again on proper use with distilled water or sterile water and proper cleaning of vessels). Will also have her continue BID Flonase to improve mucociliary function - she has not had any worsened nosebleeds since adding this back.  She will return in 6 weeks to reassess.

## 2018-01-02 NOTE — Assessment & Plan Note (Signed)
Description c/w tension headaches - Discussed that these are likely occurring due to sudden withdrawal of NSAIDs and prednisone. Would anticipate these to get better with time. Advised to stick with tylenol and keeping well hydrated.

## 2018-01-02 NOTE — Assessment & Plan Note (Signed)
She has no tenderness to her achilles tendon and negative squeeze test of ankle. She does have a significant inner rotation of the left ankle with observation of her gait. Mild swelling surrounding the left lateral malleolus. This seems to be exacerbation of a chronic problem and not acute tendonitis r/t FQ. Advised to continue with conservative measures with rest and ice with tylenol as needed for pain.

## 2018-01-22 ENCOUNTER — Telehealth: Payer: Self-pay | Admitting: *Deleted

## 2018-01-22 NOTE — Telephone Encounter (Signed)
Patient called to report that she does not feel the antibiotic is working. She still has green drainage and she can taste it and smell it. She has no pain of fever but is worried she is not getting better. She would like to see Colletta [place] prior to stopping the antibiotic or possibly change the medication all together. Advised we can see her Monday 01/28/18 at 330 and she accepted the appt. Advised her will let the provider know and if she wants something done sooner someone will call her as soon as we know.

## 2018-01-22 NOTE — Telephone Encounter (Signed)
Thank you Travis! 

## 2018-01-28 ENCOUNTER — Encounter: Payer: Self-pay | Admitting: Infectious Diseases

## 2018-01-28 ENCOUNTER — Telehealth: Payer: Self-pay | Admitting: *Deleted

## 2018-01-28 ENCOUNTER — Ambulatory Visit (INDEPENDENT_AMBULATORY_CARE_PROVIDER_SITE_OTHER): Payer: PPO | Admitting: Infectious Diseases

## 2018-01-28 DIAGNOSIS — J320 Chronic maxillary sinusitis: Secondary | ICD-10-CM

## 2018-01-28 NOTE — Telephone Encounter (Signed)
Patient requested that the flag "Patient has Legal Guardian" be deleted from her chart. Patient states she is of sound mind, does not have a legal guardian. This note was added during her hospitalization/surgery 02/03/17.   Flag deleted. Landis Gandy, RN

## 2018-01-28 NOTE — Progress Notes (Signed)
Name: Victoria Mosley  DOB: 06-14-1951  MRN: [id number] PCP: Laurey Morale, MD  Referring Provider: Alysia Penna, MD    Patient Active Problem List   Diagnosis Date Noted  . Left ankle pain 01/02/2018  . Chronic maxillary sinusitis 09/28/2017  . OA (osteoarthritis) of hip 09/27/2016  . Dizziness 04/17/2016  . S/P total knee arthroplasty 12/19/2014  . Intractable back pain 12/19/2014  . Sciatica of left side 12/19/2014  . Migraines 12/19/2014  . DDD (degenerative disc disease), lumbar 12/19/2014  . Left-sided low back pain with left-sided sciatica   . Status post total right knee replacement   . OA (osteoarthritis) of knee 12/07/2014  . 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 Migraine with aura, intractable 08/06/2012  . Overactive bladder 07/31/2012  . Epistaxis 07/12/2012  . IBS (irritable bowel syndrome) 07/26/2011  . OSTEOARTHRITIS 01/26/2010  . NEPHROLITHIASIS, HX OF 01/26/2010  . DIVERTICULITIS, COLON 11/02/2008  . Hyperlipidemia 02/25/2008  . Headache 10/17/2007  . ALLERGIC RHINITIS 06/19/2007  . GERD 06/19/2007   Brief Narrative:  Victoria Mosley is a 67 y.o. female with pmhx of chronic sinus infections for the last 2 years that is refractor to several different antibiotics and surgery. She is a patient of Dr. Janace Hoard with ENT since 2017. She has underwent several nasal endoscopy cleanings - irritation to the left septum where she has frequent nosebleeds as well as perforation but sinuses have always been open at the time. Previous cx from this material has shown MSSA. (S-clindamycin, bactrim). Failed 6w clindamycin therapy and I had her return to Dr. Janace Hoard to recollect sample after post-tx CT showed opacification and thickening of the left maxillary sinus. Heavy pseudomonas growth noted (sensitive). She has also had dental xrays that do not reveal a defect that would continue to cause sinus  issues.   Subjective:   Chief Complaint  Patient presents with  . Follow-up    chronic sinusitus     HPI:  Since our last office visit she has had a relapse in symptoms of halitosis, dysgeusia, and intermittent green nasal drainage. Her headaches are slightly improved however she has had worsened symptoms of aches and pains since she has stopped taking her Aleve and other anti-inflammatories to avoid side effects when combined with ciprofloxacin. She has continued Flonase and intermittent nasal irrigation. She has about 3 days left of antibiotics left and is wondering if she should continue or stop.   Review of Systems  Constitutional: Positive for malaise/fatigue. Negative for chills, diaphoresis and fever.  HENT: Negative for congestion, sinus pain, sore throat and tinnitus.   Eyes: Negative for blurred vision and photophobia.  Respiratory: Negative for cough and shortness of breath.   Cardiovascular: Negative for chest pain and leg swelling.  Gastrointestinal: Negative for abdominal pain and diarrhea.  Genitourinary: Negative for dysuria and urgency.  Musculoskeletal: Positive for joint pain.  Skin: Negative for rash.  Neurological: Positive for headaches. Negative for dizziness.   Past Medical History:  Diagnosis Date  . 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 Adenomatous colon polyp 2004  . Arthritis   . Bilateral knee pain    sees Dr. Wynelle Link   . Bilateral renal cysts   . Chronic UTI (urinary tract infection)   . Complication of anesthesia   . Cough 11/30/14    cough since 11/20/14- now productine- green with green mucus out of  nose- no fever  . Diverticulitis   . GERD (gastroesophageal reflux disease)   . History of kidney stones   . History of melanoma excision   . History of renal calculi   . 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 Denman George 04/30/2017  . Hyperlipidemia   . IBS (irritable bowel syndrome)   . Kidney stones     bilateral  . melanoma dx'd 2011   rt arm--surg only  . Menopause    sees Dr. Phineas Real  . Migraines   . Nephrolithiasis    left  . Neurogenic bladder   . Normal cardiac stress test 2012  . PONV (postoperative nausea and vomiting)   . Sepsis ([place]) 01/2016   related to infection caused by kidney stone blockage   . Tendonitis    right ankle  . Urinary urgency   . UTI (urinary tract infection)    started atb on 03-24-12   Outpatient Medications Prior to Visit  Medication Sig Dispense Refill  . acetaminophen (TYLENOL) 325 MG tablet Take 650 mg by mouth every 6 (six) hours as needed for mild pain or headache.    . Ascorbic Acid (VITAMIN C) 1000 MG tablet Take 1,000 mg by mouth daily.    Marland Kitchen b complex vitamins tablet Take 1 tablet by mouth daily.    . Butalbital-APAP-Caffeine 50-300-40 MG CAPS Take 1 capsule by mouth 2 (two) times daily as needed (headache). 60 capsule 5  . CALCIUM CITRATE PO Take 1,000 mg by mouth every evening.    . Cranberry 250 MG CAPS Take 500 mg by mouth 2 (two) times daily.    . diphenhydrAMINE (BENADRYL) 25 mg capsule Take 25 mg by mouth at bedtime.    Marland Kitchen estradiol (ESTRACE) 0.5 MG tablet Take 1 tablet (0.5 mg total) by mouth daily. 90 tablet 1  . ezetimibe (ZETIA) 10 MG tablet Take 1 tablet (10 mg total) by mouth daily. 90 tablet 3  . Flaxseed, Linseed, (FLAXSEED OIL) 1200 MG CAPS Take 1,200 mg by mouth 2 (two) times daily.    . fluconazole (DIFLUCAN) 150 MG tablet Take 1 tablet (150 mg total) by mouth 2 (two) times a week. 12 tablet 5  . fluconazole (DIFLUCAN) 150 MG tablet Take 1 tablet (150 mg total) by mouth once a week. 2 tablet 0  . hydrochlorothiazide (HYDRODIURIL) 25 MG tablet Take 1 tablet (25 mg total) by mouth daily. 90 tablet 3  . LACTOBACILLUS PO Take 1 tablet by mouth daily.    . medroxyPROGESTERone (PROVERA) 2.5 MG tablet Take 1 tablet (2.5 mg total) by mouth daily. 90 tablet 4  . Multiple Vitamins-Minerals (WOMENS 50+ MULTI VITAMIN/MIN PO) Take 1  tablet by mouth daily.    . naproxen sodium (ANAPROX) 220 MG tablet Take 220 mg by mouth 2 (two) times daily with a meal.     . Omega-3 Fatty Acids (FISH OIL) 1000 MG CAPS Take 1,000 mg by mouth daily.    Marland Kitchen omeprazole (PRILOSEC) 20 MG capsule Take 1 capsule (20 mg total) by mouth daily. 90 capsule 3  . potassium gluconate 595 MG TABS Take 1,190 mg by mouth every evening.     . predniSONE (DELTASONE) 5 MG tablet Take 1 tablet (5 mg total) by mouth daily with breakfast. 30 tablet 2  . prochlorperazine (COMPAZINE) 10 MG tablet Take 1 tablet (10 mg total) by mouth every 6 (six) hours as needed for nausea or vomiting. 30 tablet 0  . propranolol (  INDERAL) 40 MG tablet Take 1 tablet (40 mg total) by mouth daily. 90 tablet 3  . estropipate (OGEN) 0.75 MG tablet Take 1 tablet (0.75 mg total) by mouth daily. (Patient not taking: Reported on 01/28/2018) 90 tablet 4  . ESTROPIPATE PO Take 2.5 mg by mouth.    . loratadine-pseudoephedrine (CLARITIN-D 24-HOUR) 10-240 MG 24 hr tablet Take 1 tablet by mouth daily.    . traMADol (ULTRAM) 50 MG tablet Take 1-2 tablets (50-100 mg total) by mouth every 6 (six) hours as needed for moderate pain. (Patient not taking: Reported on 01/28/2018) 60 tablet 2   No facility-administered medications prior to visit.    Allergies  Allergen Reactions  . Levaquin [Levofloxacin In D5w] Anaphylaxis, Rash and Other (See Comments)    Hot, dizziness, diarrhea. Tolerated Ciprofloxacin 12/2017  . Pravastatin Sodium [Pravachol]     Joint pain(severe)  . Septra [Sulfamethoxazole-Trimethoprim] Anaphylaxis    diarrhea  . Biaxin [Clarithromycin] Nausea And Vomiting  . Clarithromycin Nausea Only  . Codeine Nausea And Vomiting  . Dust Mite Extract Other (See Comments)      Grass, trees also- cause Runny nose, sneezing (allergy symptoms)   Social History   Tobacco Use  . Smoking status: Former Smoker    Packs/day: 0.25    Last attempt to quit: 12/20/1998    Years since quitting: 19.1  .  Smokeless tobacco: Never Used  Substance Use Topics  . Alcohol use: No    Alcohol/week: 0.0 oz  . Drug use: No    Objective:   Vitals:   01/28/18 1516  BP: 119/77  Pulse: 76  Temp: 97.6 F (36.4 C)  TempSrc: Oral  Weight: 198 lb (89.8 kg)  Height: 5\' 7"  (1.702 m)   Body mass index is 31.01 kg/m.  Physical Exam  Constitutional: She is oriented to person, place, and time and well-developed, well-nourished, and in no distress.  HENT:  Mouth/Throat: Oropharynx is clear and moist. No oral lesions. No dental abscesses.  Eyes: Pupils are equal, round, and reactive to light. Conjunctivae are normal.  Eyes appear less swollen   Neck: Neck supple.  Cardiovascular: Normal rate, regular rhythm and normal heart sounds.  Pulmonary/Chest: Effort normal and breath sounds normal.  Abdominal: Soft. She exhibits no distension. There is no tenderness.  Musculoskeletal: Normal range of motion. She exhibits no tenderness.       Left ankle: She exhibits swelling (left lateral ankle ). She exhibits normal range of motion. No tenderness. No CF ligament and no posterior TFL tenderness found. Achilles tendon normal.  Lymphadenopathy:    She has no cervical adenopathy.  Neurological: She is alert and oriented to person, place, and time.  Skin: Skin is warm and dry. No rash noted.  Psychiatric: Mood, affect and judgment normal.   ASSESSMENT & PLAN:  Problem List Items Addressed This Visit      Respiratory   Chronic maxillary sinusitis    She has completed several courses of prolonged antibiotic therapies targeting different bacteria and unfortunately has had refractory symptoms. I am not certain that antibiotics will provide cure for her; It is interesting that she reports partial resolution of symptoms when she has had recent travel which confirms to me that symptoms are likely multifactorial including allergic, inflammatory, anatomic. Will stop cipro and have her resume her antiinflammatories as  previously taken. She will use pseudoephedrine as needed. I advised her to continue with flonase daily, antihistamines daily and discussed alternative saline irrigants such as Netti pot and  some of the other pressurized rinses to reduce symptoms. Dr. Janace Hoard also mentioned a caudal block in his recent office notes and I asked her to follow up with him to see what that procedure may offer her regarding symptom relief.  She would like to follow up with me again in a few weeks off antibiotics.         I spent 15 minutes with the patient including greater than 50% of time in face to face counsel of the patient re sinusitis, preventative/symptom management and pros/cons of antibiotics.   Janene Madeira, MSN, [facility]

## 2018-01-30 NOTE — Assessment & Plan Note (Addendum)
She has completed several courses of prolonged antibiotic therapies targeting different bacteria and unfortunately has had refractory symptoms. I am not certain that antibiotics will provide cure for her; It is interesting that she reports partial resolution of symptoms when she has had recent travel which confirms to me that symptoms are likely multifactorial including allergic, inflammatory, anatomic. Will stop cipro and have her resume her antiinflammatories as previously taken. She will use pseudoephedrine as needed. I advised her to continue with flonase daily, antihistamines daily and discussed alternative saline irrigants such as Netti pot and some of the other pressurized rinses to reduce symptoms. Dr. Janace Hoard also mentioned a caudal block in his recent office notes and I asked her to follow up with him to see what that procedure may offer her regarding symptom relief.  She would like to follow up with me again in a few weeks off antibiotics.

## 2018-02-01 ENCOUNTER — Telehealth: Payer: Self-pay | Admitting: Family Medicine

## 2018-02-01 DIAGNOSIS — M810 Age-related osteoporosis without current pathological fracture: Secondary | ICD-10-CM

## 2018-02-01 NOTE — Telephone Encounter (Signed)
Sent to PCP ?

## 2018-02-01 NOTE — Telephone Encounter (Signed)
Copied from [place]. Topic: Inquiry >> Feb 01, 2018 10:22 AM Pricilla Handler wrote: Reason for CRM: Patient called requesting that an order be placed by Dr. Sarajane Jews for patient to have a Bone Scan. Patient states that she is to have one every 25 months, and now is the time. Please call patient to concerning the Bone Density order at [phone]. Patient normally has her Bone Density at [facility]!!!

## 2018-02-04 NOTE — Telephone Encounter (Signed)
I put in the order

## 2018-02-04 NOTE — Telephone Encounter (Signed)
Called and spoke with pt. Pt advised to call our office to get schedule for her BD whenever covenant for her.

## 2018-02-06 ENCOUNTER — Telehealth: Payer: Self-pay

## 2018-02-06 DIAGNOSIS — J320 Chronic maxillary sinusitis: Secondary | ICD-10-CM

## 2018-02-06 NOTE — Telephone Encounter (Signed)
Pt called today stating she was hoping Colletta Maryland would be able to recommend some ENT specialist  for her sinus infection. 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  [place]

## 2018-02-07 NOTE — Telephone Encounter (Signed)
I have sent a referral for Dr. Erik Obey.

## 2018-02-07 NOTE — Addendum Note (Signed)
Addended by: Galateo Callas on: 02/07/2018 05:33 PM   Modules accepted: Orders

## 2018-02-08 NOTE — Telephone Encounter (Signed)
Would recommend Dr. Melony Overly at the [facility] location. I have sent in referral. Please let Pam know. Thank you.

## 2018-02-08 NOTE — Addendum Note (Signed)
Addended by: Neffs Callas on: 02/08/2018 11:50 AM   Modules accepted: Orders

## 2018-02-08 NOTE — Telephone Encounter (Signed)
Pt called today and was hoping to have a new referral sent in for ENT. Pt stated she would prefer to be sent to ENT on [facility]. Will inform Janene Madeira, NP to have referral for Dr. Erik Obey cancelled and for a new one to be sent to ENT on [facility]. [place]

## 2018-02-11 NOTE — Telephone Encounter (Signed)
Thank you Travis! 

## 2018-02-11 NOTE — Telephone Encounter (Signed)
Office assistant from Dr Jennell Corner office called for notes on the patient as they have her scheduled to see the doctor there Thursday 02/14/18. Advised will let the provider know the patient called and scheduled herself and sending information. Fax [phone]

## 2018-02-12 ENCOUNTER — Ambulatory Visit (INDEPENDENT_AMBULATORY_CARE_PROVIDER_SITE_OTHER)
Admission: RE | Admit: 2018-02-12 | Discharge: 2018-02-12 | Disposition: A | Discharge status: Home / Self Care | Payer: PPO | Source: Ambulatory Visit

## 2018-02-12 DIAGNOSIS — M810 Age-related osteoporosis without current pathological fracture: Secondary | ICD-10-CM

## 2018-02-14 DIAGNOSIS — J329 Chronic sinusitis, unspecified: Secondary | ICD-10-CM | POA: Diagnosis not present

## 2018-02-25 ENCOUNTER — Ambulatory Visit: Payer: PPO | Admitting: Infectious Diseases

## 2018-03-11 DIAGNOSIS — G43909 Migraine, unspecified, not intractable, without status migrainosus: Secondary | ICD-10-CM | POA: Insufficient documentation

## 2018-03-11 DIAGNOSIS — N200 Calculus of kidney: Secondary | ICD-10-CM | POA: Insufficient documentation

## 2018-03-11 DIAGNOSIS — N390 Urinary tract infection, site not specified: Secondary | ICD-10-CM | POA: Diagnosis not present

## 2018-03-12 ENCOUNTER — Other Ambulatory Visit: Payer: Self-pay | Admitting: Family Medicine

## 2018-03-12 ENCOUNTER — Telehealth: Payer: Self-pay | Admitting: *Deleted

## 2018-03-12 NOTE — Telephone Encounter (Signed)
Fabulous! So glad she called with an update. Sounds like she still has some pseudomonas playing a role in her sinus tract.

## 2018-03-12 NOTE — Telephone Encounter (Signed)
-----   Message from [place] sent at 03/12/2018 10:35 AM EDT ----- Patient would like a call back to provide an update. Patient started a new medication and wants Colletta Maryland to know how she's doing

## 2018-03-12 NOTE — Telephone Encounter (Signed)
Patient called to update Stephanie. She was Dr Lucia Gaskins, ENT, who advised her to use netipot with chito-rhino and "tobra" capsule twice daily since April 24th. She has about 1 week left. She states she is 80% better, wanted to relay her treatment. The taste in her mouth and drainage are much improved. Landis Gandy, RN

## 2018-03-15 ENCOUNTER — Other Ambulatory Visit: Payer: Self-pay | Admitting: Family Medicine

## 2018-03-15 NOTE — Telephone Encounter (Signed)
Please advise 

## 2018-03-15 NOTE — Telephone Encounter (Signed)
Patient called and informed this medication was refused because it is not a medication that is routinely prescribed for every day use, unless there is an acute issue. I asked why does she need a refill, she says "I have mouth ulcers that I get when I get nervous and Prednisone is the only thing that works for them. Dr. Sarajane Jews has prescribed this for me in the past for years." I asked if I could schedule an appointment, since this is something new that has come up, she says "I won't come. I have a few left and I will just take them. If he won't fill it, then he won't fill it."    Prednisone refill Last OV:11/19/17 Last refill:11/19/17 30 tab/0 refill PCP:[facility]: [facility] [address] [place] [address] [phone] (Phone) [phone] (Fax)

## 2018-03-15 NOTE — Telephone Encounter (Signed)
Copied from [place] [id number]. Topic: Quick Communication - Rx Refill/Question >> Mar 15, 2018 11:35 AM Neva Seat wrote: predniSONE (DELTASONE) 5 MG tablet  Pt needing refills  [facility] [place], [place] - [address] [place] [place] [address] Phone: [phone] Fax: [phone]

## 2018-03-18 ENCOUNTER — Telehealth: Payer: Self-pay

## 2018-03-18 MED ORDER — PREDNISONE 5 MG PO TABS: 5.0000 mg | ORAL_TABLET | Freq: Every day | ORAL | 2 refills | Status: DC

## 2018-03-18 NOTE — Telephone Encounter (Signed)
Rx for Prednisone 5 mg was phoned in to pt pharmacy #[id number] with 2 refills per dr Sarajane Jews.

## 2018-03-28 ENCOUNTER — Ambulatory Visit: Payer: PPO | Admitting: Infectious Diseases

## 2018-04-18 ENCOUNTER — Encounter: Payer: Self-pay | Admitting: Gynecology

## 2018-04-23 ENCOUNTER — Ambulatory Visit (INDEPENDENT_AMBULATORY_CARE_PROVIDER_SITE_OTHER): Payer: PPO | Admitting: Gynecology

## 2018-04-23 ENCOUNTER — Encounter: Payer: Self-pay | Admitting: Gynecology

## 2018-04-23 VITALS — BP 124/78 | Ht 67.5 in | Wt 200.0 lb

## 2018-04-23 DIAGNOSIS — Z01419 Encounter for gynecological examination (general) (routine) without abnormal findings: Secondary | ICD-10-CM

## 2018-04-23 DIAGNOSIS — N952 Postmenopausal atrophic vaginitis: Secondary | ICD-10-CM

## 2018-04-23 DIAGNOSIS — R350 Frequency of micturition: Secondary | ICD-10-CM

## 2018-04-23 DIAGNOSIS — Z124 Encounter for screening for malignant neoplasm of cervix: Secondary | ICD-10-CM

## 2018-04-23 DIAGNOSIS — Z79890 Hormone replacement therapy (postmenopausal): Secondary | ICD-10-CM

## 2018-04-23 DIAGNOSIS — Z9189 Other specified personal risk factors, not elsewhere classified: Secondary | ICD-10-CM

## 2018-04-23 DIAGNOSIS — R87810 Cervical high risk human papillomavirus (HPV) DNA test positive: Secondary | ICD-10-CM | POA: Diagnosis not present

## 2018-04-23 DIAGNOSIS — M8580 Other specified disorders of bone density and structure, unspecified site: Secondary | ICD-10-CM

## 2018-04-23 MED ORDER — MEDROXYPROGESTERONE ACETATE 2.5 MG PO TABS: 2.5000 mg | ORAL_TABLET | Freq: Every day | ORAL | 4 refills | Status: DC

## 2018-04-23 MED ORDER — ESTRADIOL 0.5 MG PO TABS: 0.5000 mg | ORAL_TABLET | Freq: Every day | ORAL | 4 refills | Status: DC

## 2018-04-23 NOTE — Patient Instructions (Signed)
Follow-up for Pap smear results.  Follow-up in 1 year for annual exam. 

## 2018-04-23 NOTE — Addendum Note (Signed)
Addended by: Nelva Nay on: 04/23/2018 12:13 PM   Modules accepted: Orders

## 2018-04-23 NOTE — Progress Notes (Signed)
    Victoria Mosley 1951/10/11 [id number]        67 y.o.  G1P1001 for breast and pelvic exam.  Several issues noted below.  Past medical history,surgical history, problem list, medications, allergies, family history and social history were all reviewed and documented as reviewed in the EPIC chart.  ROS:  Performed with pertinent positives and negatives included in the history, assessment and plan.   Additional significant findings : None   Exam: [name] assistant Vitals:   04/23/18 1129  BP: 124/78  Weight: 200 lb (90.7 kg)  Height: 5' 7.5" (1.715 m)   Body mass index is 30.86 kg/m.  General appearance:  Normal affect, orientation and appearance. Skin: Grossly normal HEENT: Without gross lesions.  No cervical or supraclavicular adenopathy. Thyroid normal.  Lungs:  Clear without wheezing, rales or rhonchi Cardiac: RR, without RMG Abdominal:  Soft, nontender, without masses, guarding, rebound, organomegaly or hernia Breasts:  Examined lying and sitting without masses, retractions, discharge or axillary adenopathy. Pelvic:  Ext, BUS, Vagina: With atrophic changes  Cervix: High in the vaginal vault.  Pap smear/HPV done  Uterus: Difficult to palpate but grossly normal without masses and tenderness  Adnexa: Without masses or tenderness    Anus and perineum: Normal   Rectovaginal: Normal sphincter tone without palpated masses or tenderness.    Assessment/Plan:  67 y.o. G34P1001 female for breast and pelvic exam.   1. Postmenopausal/HRT.  Continues on estradiol 0.5 mg and Provera 2.5 mg daily.  Has done so for years with good relief of her menopausal symptoms.  Reviewed options to discontinue now versus continuing.  Risks versus benefits reviewed.  Increased risk for stroke heart attack DVT and breast cancer weighed against benefits of symptom relief and possible cardiovascular and bone health benefits.  At this point the patient strongly wants to continue and I refilled her x1  year. 2. History of persistent HPV and low-grade atypia.  History of positive high risk HPV and normal cytology 2015 and 2016.  ECC showed dysplastic epithelium favoring LGSIL.  LEEP 2016 with LGSIL and clear margins.  Pap smear 2017 normal cytology positive high risk HPV negative subtype 16, 18/45.  Pap smear last year ASCUS with positive high risk HPV.  Colposcopy with stenotic cervical loss unable to sample.  Consultation with Dr. Everitt Amber where the patient and she had a discussion about proceeding with hysterectomy versus expectant management in this point the patient elects for expectant management.  Pap smear/HPV done today. 3. Osteopenia.  DEXA 11/2017 T score -1.2.  FRAX not calculated due to being on estrogen.  We will plan on repeat DEXA in 2 years. 4. Colonoscopy 2018.  Repeat at their recommended interval. 5. Mammography 11/2017.  Continue with annual mammography when due.  Breast exam normal today. 6. Health maintenance.  No routine lab work done as patient does this elsewhere.  Recently treated for strep UTI at urgent care.  Check urine analysis today as patient notes a little bit of frequency.  Follow-up in 1 year, sooner as needed.   [name] MD, 12:05 PM 04/23/2018

## 2018-04-24 LAB — URINALYSIS, COMPLETE W/RFL CULTURE
Bacteria, UA: NONE SEEN /HPF
Bilirubin Urine: NEGATIVE
Glucose, UA: NEGATIVE
Hgb urine dipstick: NEGATIVE
Hyaline Cast: NONE SEEN /LPF
Ketones, ur: NEGATIVE
Leukocyte Esterase: NEGATIVE
Nitrites, Initial: NEGATIVE
Protein, ur: NEGATIVE
RBC / HPF: NONE SEEN /HPF (ref 0–2)
Specific Gravity, Urine: 1.022 (ref 1.001–1.03)
WBC, UA: NONE SEEN /HPF (ref 0–5)
pH: 5.5 (ref 5.0–8.0)

## 2018-04-24 LAB — NO CULTURE INDICATED

## 2018-04-29 ENCOUNTER — Encounter: Payer: Self-pay | Admitting: Gynecology

## 2018-04-29 LAB — PAP IG AND HPV HIGH-RISK: HPV DNA High Risk: DETECTED — AB

## 2018-04-29 LAB — HPV TYPE 16 AND 18/45 RNA
HPV Type 16 RNA: NOT DETECTED
HPV Type 18/45 RNA: NOT DETECTED

## 2018-05-01 DIAGNOSIS — M79642 Pain in left hand: Secondary | ICD-10-CM | POA: Diagnosis not present

## 2018-05-01 DIAGNOSIS — M79641 Pain in right hand: Secondary | ICD-10-CM | POA: Diagnosis not present

## 2018-05-01 DIAGNOSIS — G5603 Carpal tunnel syndrome, bilateral upper limbs: Secondary | ICD-10-CM | POA: Diagnosis not present

## 2018-05-16 DIAGNOSIS — L700 Acne vulgaris: Secondary | ICD-10-CM | POA: Diagnosis not present

## 2018-05-16 DIAGNOSIS — Z85820 Personal history of malignant melanoma of skin: Secondary | ICD-10-CM | POA: Diagnosis not present

## 2018-05-16 DIAGNOSIS — D229 Melanocytic nevi, unspecified: Secondary | ICD-10-CM | POA: Diagnosis not present

## 2018-05-16 DIAGNOSIS — L218 Other seborrheic dermatitis: Secondary | ICD-10-CM | POA: Diagnosis not present

## 2018-05-16 DIAGNOSIS — L821 Other seborrheic keratosis: Secondary | ICD-10-CM | POA: Diagnosis not present

## 2018-05-16 DIAGNOSIS — D1801 Hemangioma of skin and subcutaneous tissue: Secondary | ICD-10-CM | POA: Diagnosis not present

## 2018-05-16 DIAGNOSIS — L570 Actinic keratosis: Secondary | ICD-10-CM | POA: Diagnosis not present

## 2018-05-20 ENCOUNTER — Other Ambulatory Visit: Payer: Self-pay | Admitting: Gynecology

## 2018-05-21 ENCOUNTER — Other Ambulatory Visit: Payer: Self-pay

## 2018-05-21 MED ORDER — ESTRADIOL 0.5 MG PO TABS: 0.5000 mg | ORAL_TABLET | Freq: Every day | ORAL | 3 refills | Status: DC

## 2018-06-30 DIAGNOSIS — G5603 Carpal tunnel syndrome, bilateral upper limbs: Secondary | ICD-10-CM | POA: Insufficient documentation

## 2018-06-30 HISTORY — PX: CARPAL TUNNEL RELEASE: SHX101

## 2018-07-12 DIAGNOSIS — G5601 Carpal tunnel syndrome, right upper limb: Secondary | ICD-10-CM | POA: Diagnosis not present

## 2018-07-17 ENCOUNTER — Encounter: Payer: Self-pay | Admitting: Family Medicine

## 2018-07-17 ENCOUNTER — Ambulatory Visit (INDEPENDENT_AMBULATORY_CARE_PROVIDER_SITE_OTHER): Payer: PPO | Admitting: Family Medicine

## 2018-07-17 VITALS — BP 130/82 | HR 67 | Temp 98.8°F | Wt 199.5 lb

## 2018-07-17 DIAGNOSIS — N390 Urinary tract infection, site not specified: Secondary | ICD-10-CM

## 2018-07-17 DIAGNOSIS — R300 Dysuria: Secondary | ICD-10-CM

## 2018-07-17 LAB — POCT URINALYSIS DIPSTICK
Bilirubin, UA: NEGATIVE
Blood, UA: NEGATIVE
Glucose, UA: NEGATIVE
Nitrite, UA: POSITIVE
Protein, UA: NEGATIVE
Spec Grav, UA: 1.02 (ref 1.010–1.025)
Urobilinogen, UA: 0.2 E.U./dL
pH, UA: 7 (ref 5.0–8.0)

## 2018-07-17 MED ORDER — NITROFURANTOIN MONOHYD MACRO 100 MG PO CAPS: 100.0000 mg | ORAL_CAPSULE | Freq: Two times a day (BID) | ORAL | 0 refills | Status: DC

## 2018-07-17 NOTE — Progress Notes (Signed)
   Subjective:    Patient ID: Victoria Mosley, female    DOB: 1951-08-10, 67 y.o.   MRN: [id number]  HPI Here for 3 days of foul smelling urine. No urgency or burning. No fever. Of note she was treated for a Pseudomonas sinusitis by Dr. Lucia Gaskins in April by using Cipro. She had a right wrist carpal tunnel release per Dr. Amedeo Plenty in July.    Review of Systems  Constitutional: Negative.   Respiratory: Negative.   Cardiovascular: Negative.   Genitourinary: Negative for dysuria, flank pain and hematuria.       Objective:   Physical Exam  Constitutional: She appears well-developed and well-nourished.  Cardiovascular: Normal rate, regular rhythm, normal heart sounds and intact distal pulses.  Pulmonary/Chest: Effort normal and breath sounds normal.  Abdominal: Soft. Bowel sounds are normal. She exhibits no distension and no mass. There is no tenderness. There is no rebound and no guarding.          Assessment & Plan:  UTI, treat with Nitrofurantoin. Culture the sample.  Alysia Penna, MD

## 2018-07-18 DIAGNOSIS — M13849 Other specified arthritis, unspecified hand: Secondary | ICD-10-CM | POA: Diagnosis not present

## 2018-07-18 DIAGNOSIS — M79641 Pain in right hand: Secondary | ICD-10-CM | POA: Diagnosis not present

## 2018-07-18 DIAGNOSIS — G5603 Carpal tunnel syndrome, bilateral upper limbs: Secondary | ICD-10-CM | POA: Diagnosis not present

## 2018-07-19 DIAGNOSIS — M19049 Primary osteoarthritis, unspecified hand: Secondary | ICD-10-CM | POA: Insufficient documentation

## 2018-07-19 LAB — URINE CULTURE
MICRO NUMBER:: 91120437
SPECIMEN QUALITY:: ADEQUATE

## 2018-07-26 DIAGNOSIS — M79641 Pain in right hand: Secondary | ICD-10-CM | POA: Diagnosis not present

## 2018-08-15 ENCOUNTER — Ambulatory Visit (INDEPENDENT_AMBULATORY_CARE_PROVIDER_SITE_OTHER): Payer: PPO

## 2018-08-15 VITALS — BP 130/80 | HR 81 | Ht 67.0 in | Wt 198.0 lb

## 2018-08-15 DIAGNOSIS — Z0000 Encounter for general adult medical examination without abnormal findings: Secondary | ICD-10-CM

## 2018-08-15 DIAGNOSIS — R8290 Unspecified abnormal findings in urine: Secondary | ICD-10-CM | POA: Diagnosis not present

## 2018-08-15 DIAGNOSIS — Z23 Encounter for immunization: Secondary | ICD-10-CM | POA: Diagnosis not present

## 2018-08-15 LAB — POCT URINALYSIS DIPSTICK
Bilirubin, UA: NEGATIVE
Blood, UA: NEGATIVE
Glucose, UA: NEGATIVE
Ketones, UA: NEGATIVE
Nitrite, UA: 0.25
Odor: NEGATIVE
Protein, UA: POSITIVE — AB
Spec Grav, UA: 1.025 (ref 1.010–1.025)
Urobilinogen, UA: NEGATIVE E.U./dL — AB
pH, UA: 5 (ref 5.0–8.0)

## 2018-08-15 LAB — MICROALBUMIN, URINE: Microalb, Ur: NEGATIVE

## 2018-08-15 NOTE — Patient Instructions (Addendum)
Victoria Mosley , Thank you for taking time to come for your Medicare Wellness Visit. I appreciate your ongoing commitment to your health goals. Please review the following plan we discussed and let me know if I can assist you in the future.   Per Orders of dr. Ethlyn Gallery; we will do a urinalysis and culture Please drink plenty of fluids and if you have further symptoms, please call and come in to see Dr. Sarajane Jews or urology   The Centers for Disease Control are now recommending 2 pneumonia vaccinations after 61. The first is the Prevnar 13. (07/29/15) This helps to boost your immunity to community acquired pneumonia as well as some protection from bacterial pneumonia  The 2nd is the pneumovax 23, ( 2013) which offers more broad protection!  Please consider taking these as this is your best protection against pneumonia.  You need on Prevnar as an adult. You have had one, so it will not be repeated. The PSV 23, it has been 5 years, so will will repeat today with our flu vaccine      These are the goals we discussed: Goals   None     This is a list of the screening recommended for you and due dates:  Health Maintenance  Topic Date Due  . Pneumonia vaccines (2 of 2 - PPSV23) 05/28/2017  . Flu Shot  05/30/2018  . Mammogram  12/21/2018  . Colon Cancer Screening  01/29/2022  . Tetanus Vaccine  05/28/2022  . DEXA scan (bone density measurement)  Completed  .  Hepatitis C: One time screening is recommended by Center for Disease Control  (CDC) for  adults born from [birth year] through 1964.   Completed      Fall Prevention in the Home Falls can cause injuries. They can happen to people of all ages. There are many things you can do to make your home safe and to help prevent falls. What can I do on the outside of my home?  Regularly fix the edges of walkways and driveways and fix any cracks.  Remove anything that might make you trip as you walk through a door, such as a raised step or  threshold.  Trim any bushes or trees on the path to your home.  Use bright outdoor lighting.  Clear any walking paths of anything that might make someone trip, such as rocks or tools.  Regularly check to see if handrails are loose or broken. Make sure that both sides of any steps have handrails.  Any raised decks and porches should have guardrails on the edges.  Have any leaves, snow, or ice cleared regularly.  Use sand or salt on walking paths during winter.  Clean up any spills in your garage right away. This includes oil or grease spills. What can I do in the bathroom?  Use night lights.  Install grab bars by the toilet and in the tub and shower. Do not use towel bars as grab bars.  Use non-skid mats or decals in the tub or shower.  If you need to sit down in the shower, use a plastic, non-slip stool.  Keep the floor dry. Clean up any water that spills on the floor as soon as it happens.  Remove soap buildup in the tub or shower regularly.  Attach bath mats securely with double-sided non-slip rug tape.  Do not have throw rugs and other things on the floor that can make you trip. What can I do in the bedroom?  Use  night lights.  Make sure that you have a light by your bed that is easy to reach.  Do not use any sheets or blankets that are too big for your bed. They should not hang down onto the floor.  Have a firm chair that has side arms. You can use this for support while you get dressed.  Do not have throw rugs and other things on the floor that can make you trip. What can I do in the kitchen?  Clean up any spills right away.  Avoid walking on wet floors.  Keep items that you use a lot in easy-to-reach places.  If you need to reach something above you, use a strong step stool that has a grab bar.  Keep electrical cords out of the way.  Do not use floor polish or wax that makes floors slippery. If you must use wax, use non-skid floor wax.  Do not have  throw rugs and other things on the floor that can make you trip. What can I do with my stairs?  Do not leave any items on the stairs.  Make sure that there are handrails on both sides of the stairs and use them. Fix handrails that are broken or loose. Make sure that handrails are as long as the stairways.  Check any carpeting to make sure that it is firmly attached to the stairs. Fix any carpet that is loose or worn.  Avoid having throw rugs at the top or bottom of the stairs. If you do have throw rugs, attach them to the floor with carpet tape.  Make sure that you have a light switch at the top of the stairs and the bottom of the stairs. If you do not have them, ask someone to add them for you. What else can I do to help prevent falls?  Wear shoes that: ? Do not have high heels. ? Have rubber bottoms. ? Are comfortable and fit you well. ? Are closed at the toe. Do not wear sandals.  If you use a stepladder: ? Make sure that it is fully opened. Do not climb a closed stepladder. ? Make sure that both sides of the stepladder are locked into place. ? Ask someone to hold it for you, if possible.  Clearly mark and make sure that you can see: ? Any grab bars or handrails. ? First and last steps. ? Where the edge of each step is.  Use tools that help you move around (mobility aids) if they are needed. These include: ? Canes. ? Walkers. ? Scooters. ? Crutches.  Turn on the lights when you go into a dark area. Replace any light bulbs as soon as they burn out.  Set up your furniture so you have a clear path. Avoid moving your furniture around.  If any of your floors are uneven, fix them.  If there are any pets around you, be aware of where they are.  Review your medicines with your doctor. Some medicines can make you feel dizzy. This can increase your chance of falling. Ask your doctor what other things that you can do to help prevent falls. This information is not intended to  replace advice given to you by your health care provider. Make sure you discuss any questions you have with your health care provider. Document Released: 08/12/2009 Document Revised: 03/23/2016 Document Reviewed: 11/20/2014 Elsevier Interactive Patient Education  2018 [place] Maintenance, Female Adopting a healthy lifestyle and getting preventive care can  go a long way to promote health and wellness. Talk with your health care provider about what schedule of regular examinations is right for you. This is a good chance for you to check in with your provider about disease prevention and staying healthy. In between checkups, there are plenty of things you can do on your own. Experts have done a lot of research about which lifestyle changes and preventive measures are most likely to keep you healthy. Ask your health care provider for more information. Weight and diet Eat a healthy diet  Be sure to include plenty of vegetables, fruits, low-fat dairy products, and lean protein.  Do not eat a lot of foods high in solid fats, added sugars, or salt.  Get regular exercise. This is one of the most important things you can do for your health. ? Most adults should exercise for at least 150 minutes each week. The exercise should increase your heart rate and make you sweat (moderate-intensity exercise). ? Most adults should also do strengthening exercises at least twice a week. This is in addition to the moderate-intensity exercise.  Maintain a healthy weight  Body mass index (BMI) is a measurement that can be used to identify possible weight problems. It estimates body fat based on height and weight. Your health care provider can help determine your BMI and help you achieve or maintain a healthy weight.  For females 78 years of age and older: ? A BMI below 18.5 is considered underweight. ? A BMI of 18.5 to 24.9 is normal. ? A BMI of 25 to 29.9 is considered overweight. ? A BMI of 30 and  above is considered obese.  Watch levels of cholesterol and blood lipids  You should start having your blood tested for lipids and cholesterol at 66 years of age, then have this test every 5 years.  You may need to have your cholesterol levels checked more often if: ? Your lipid or cholesterol levels are high. ? You are older than 66 years of age. ? You are at high risk for heart disease.  Cancer screening Lung Cancer  Lung cancer screening is recommended for adults 45-30 years old who are at high risk for lung cancer because of a history of smoking.  A yearly low-dose CT scan of the lungs is recommended for people who: ? Currently smoke. ? Have quit within the past 15 years. ? Have at least a 30-pack-year history of smoking. A pack year is smoking an average of one pack of cigarettes a day for 1 year.  Yearly screening should continue until it has been 15 years since you quit.  Yearly screening should stop if you develop a health problem that would prevent you from having lung cancer treatment.  Breast Cancer  Practice breast self-awareness. This means understanding how your breasts normally appear and feel.  It also means doing regular breast self-exams. Let your health care provider know about any changes, no matter how small.  If you are in your 20s or 30s, you should have a clinical breast exam (CBE) by a health care provider every 1-3 years as part of a regular health exam.  If you are 72 or older, have a CBE every year. Also consider having a breast X-ray (mammogram) every year.  If you have a family history of breast cancer, talk to your health care provider about genetic screening.  If you are at high risk for breast cancer, talk to your health care provider about having an  MRI and a mammogram every year.  Breast cancer gene (BRCA) assessment is recommended for women who have family members with BRCA-related cancers. BRCA-related cancers  include: ? Breast. ? Ovarian. ? Tubal. ? Peritoneal cancers.  Results of the assessment will determine the need for genetic counseling and BRCA1 and BRCA2 testing.  Cervical Cancer Your health care provider may recommend that you be screened regularly for cancer of the pelvic organs (ovaries, uterus, and vagina). This screening involves a pelvic examination, including checking for microscopic changes to the surface of your cervix (Pap test). You may be encouraged to have this screening done every 3 years, beginning at age 66.  For women ages 87-65, health care providers may recommend pelvic exams and Pap testing every 3 years, or they may recommend the Pap and pelvic exam, combined with testing for human papilloma virus (HPV), every 5 years. Some types of HPV increase your risk of cervical cancer. Testing for HPV may also be done on women of any age with unclear Pap test results.  Other health care providers may not recommend any screening for nonpregnant women who are considered low risk for pelvic cancer and who do not have symptoms. Ask your health care provider if a screening pelvic exam is right for you.  If you have had past treatment for cervical cancer or a condition that could lead to cancer, you need Pap tests and screening for cancer for at least 20 years after your treatment. If Pap tests have been discontinued, your risk factors (such as having a new sexual partner) need to be reassessed to determine if screening should resume. Some women have medical problems that increase the chance of getting cervical cancer. In these cases, your health care provider may recommend more frequent screening and Pap tests.  Colorectal Cancer  This type of cancer can be detected and often prevented.  Routine colorectal cancer screening usually begins at 66 years of age and continues through 66 years of age.  Your health care provider may recommend screening at an earlier age if you have risk factors  for colon cancer.  Your health care provider may also recommend using home test kits to check for hidden blood in the stool.  A small camera at the end of a tube can be used to examine your colon directly (sigmoidoscopy or colonoscopy). This is done to check for the earliest forms of colorectal cancer.  Routine screening usually begins at age 36.  Direct examination of the colon should be repeated every 5-10 years through 66 years of age. However, you may need to be screened more often if early forms of precancerous polyps or small growths are found.  Skin Cancer  Check your skin from head to toe regularly.  Tell your health care provider about any new moles or changes in moles, especially if there is a change in a mole's shape or color.  Also tell your health care provider if you have a mole that is larger than the size of a pencil eraser.  Always use sunscreen. Apply sunscreen liberally and repeatedly throughout the day.  Protect yourself by wearing long sleeves, pants, a wide-brimmed hat, and sunglasses whenever you are outside.  Heart disease, diabetes, and high blood pressure  High blood pressure causes heart disease and increases the risk of stroke. High blood pressure is more likely to develop in: ? People who have blood pressure in the high end of the normal range (130-139/85-89 mm Hg). ? People who are overweight or  obese. ? People who are African American.  If you are 67-60 years of age, have your blood pressure checked every 3-5 years. If you are 47 years of age or older, have your blood pressure checked every year. You should have your blood pressure measured twice-once when you are at a hospital or clinic, and once when you are not at a hospital or clinic. Record the average of the two measurements. To check your blood pressure when you are not at a hospital or clinic, you can use: ? An automated blood pressure machine at a pharmacy. ? A home blood pressure monitor.  If  you are between 59 years and 59 years old, ask your health care provider if you should take aspirin to prevent strokes.  Have regular diabetes screenings. This involves taking a blood sample to check your fasting blood sugar level. ? If you are at a normal weight and have a low risk for diabetes, have this test once every three years after 66 years of age. ? If you are overweight and have a high risk for diabetes, consider being tested at a younger age or more often. Preventing infection Hepatitis B  If you have a higher risk for hepatitis B, you should be screened for this virus. You are considered at high risk for hepatitis B if: ? You were born in a country where hepatitis B is common. Ask your health care provider which countries are considered high risk. ? Your parents were born in a high-risk country, and you have not been immunized against hepatitis B (hepatitis B vaccine). ? You have HIV or AIDS. ? You use needles to inject street drugs. ? You live with someone who has hepatitis B. ? You have had sex with someone who has hepatitis B. ? You get hemodialysis treatment. ? You take certain medicines for conditions, including cancer, organ transplantation, and autoimmune conditions.  Hepatitis C  Blood testing is recommended for: ? Everyone born from [birth year] through 1964. ? Anyone with known risk factors for hepatitis C.  Sexually transmitted infections (STIs)  You should be screened for sexually transmitted infections (STIs) including gonorrhea and chlamydia if: ? You are sexually active and are younger than 66 years of age. ? You are older than 66 years of age and your health care provider tells you that you are at risk for this type of infection. ? Your sexual activity has changed since you were last screened and you are at an increased risk for chlamydia or gonorrhea. Ask your health care provider if you are at risk.  If you do not have HIV, but are at risk, it may be recommended  that you take a prescription medicine daily to prevent HIV infection. This is called pre-exposure prophylaxis (PrEP). You are considered at risk if: ? You are sexually active and do not regularly use condoms or know the HIV status of your partner(s). ? You take drugs by injection. ? You are sexually active with a partner who has HIV.  Talk with your health care provider about whether you are at high risk of being infected with HIV. If you choose to begin PrEP, you should first be tested for HIV. You should then be tested every 3 months for as long as you are taking PrEP. Pregnancy  If you are premenopausal and you may become pregnant, ask your health care provider about preconception counseling.  If you may become pregnant, take 400 to 800 micrograms (mcg) of folic acid every day.  If you want to prevent pregnancy, talk to your health care provider about birth control (contraception). Osteoporosis and menopause  Osteoporosis is a disease in which the bones lose minerals and strength with aging. This can result in serious bone fractures. Your risk for osteoporosis can be identified using a bone density scan.  If you are 59 years of age or older, or if you are at risk for osteoporosis and fractures, ask your health care provider if you should be screened.  Ask your health care provider whether you should take a calcium or vitamin D supplement to lower your risk for osteoporosis.  Menopause may have certain physical symptoms and risks.  Hormone replacement therapy may reduce some of these symptoms and risks. Talk to your health care provider about whether hormone replacement therapy is right for you. Follow these instructions at home:  Schedule regular health, dental, and eye exams.  Stay current with your immunizations.  Do not use any tobacco products including cigarettes, chewing tobacco, or electronic cigarettes.  If you are pregnant, do not drink alcohol.  If you are  breastfeeding, limit how much and how often you drink alcohol.  Limit alcohol intake to no more than 1 drink per day for nonpregnant women. One drink equals 12 ounces of beer, 5 ounces of wine, or 1 ounces of hard liquor.  Do not use street drugs.  Do not share needles.  Ask your health care provider for help if you need support or information about quitting drugs.  Tell your health care provider if you often feel depressed.  Tell your health care provider if you have ever been abused or do not feel safe at home. This information is not intended to replace advice given to you by your health care provider. Make sure you discuss any questions you have with your health care provider. Document Released: 05/01/2011 Document Revised: 03/23/2016 Document Reviewed: 07/20/2015 Elsevier Interactive Patient Education  2018 [employer].   Hearing Loss Hearing loss is a partial or total loss of the ability to hear. This can be temporary or permanent, and it can happen in one or both ears. Hearing loss may be referred to as deafness. Medical care is necessary to treat hearing loss properly and to prevent the condition from getting worse. Your hearing may partially or completely come back, depending on what caused your hearing loss and how severe it is. In some cases, hearing loss is permanent. What are the causes? Common causes of hearing loss include:  Too much wax in the ear canal.  Infection of the ear canal or middle ear.  Fluid in the middle ear.  Injury to the ear or surrounding area.  An object stuck in the ear.  Prolonged exposure to loud sounds, such as music.  Less common causes of hearing loss include:  Tumors in the ear.  Viral or bacterial infections, such as meningitis.  A hole in the eardrum (perforated eardrum).  Problems with the hearing nerve that sends signals between the brain and the ear.  Certain medicines.  What are the signs or symptoms? Symptoms of this  condition may include:  Difficulty telling the difference between sounds.  Difficulty following a conversation when there is background noise.  Lack of response to sounds in your environment. This may be most noticeable when you do not respond to startling sounds.  Needing to turn up the volume on the television, radio, etc.  Ringing in the ears.  Dizziness.  Pain in the ears.  How is this diagnosed? This condition is diagnosed based on a physical exam and a hearing test (audiometry). The audiometry test will be performed by a hearing specialist (audiologist). You may also be referred to an ear, nose, and throat (ENT) specialist (otolaryngologist). How is this treated? Treatment for recent onset of hearing loss may include:  Ear wax removal.  Being prescribed medicines to prevent infection (antibiotics).  Being prescribed medicines to reduce inflammation (corticosteroids).  Follow these instructions at home:  If you were prescribed an antibiotic medicine, take it as told by your health care provider. Do not stop taking the antibiotic even if you start to feel better.  Take over-the-counter and prescription medicines only as told by your health care provider.  Avoid loud noises.  Return to your normal activities as told by your health care provider. Ask your health care provider what activities are safe for you.  Keep all follow-up visits as told by your health care provider. This is important. Contact a health care provider if:  You feel dizzy.  You develop new symptoms.  You vomit or feel nauseous.  You have a fever. Get help right away if:  You develop sudden changes in your vision.  You have severe ear pain.  You have new or increased weakness.  You have a severe headache. This information is not intended to replace advice given to you by your health care provider. Make sure you discuss any questions you have with your health care provider. Document  Released: 10/16/2005 Document Revised: 03/23/2016 Document Reviewed: 03/03/2015 Elsevier Interactive Patient Education  2018 [employer].

## 2018-08-15 NOTE — Progress Notes (Addendum)
Subjective:   Victoria Mosley is a 67 y.o. female who presents for an Initial Medicare Annual Wellness Visit.  Reports health as fair; even with many surgeries etc   Diet Chol/hdl 4; hdl 44; trig 199 a1c 5.3    Exercise Can't exercise due to back  Ankle brace on now Joint issues  Light smoker; quit in 2000   Health Maintenance Due  Topic Date Due  . PNA vac Low Risk Adult (2 of 2 - PPSV23) 05/28/2017  . INFLUENZA VACCINE  05/30/2018   Had PSV 23 today Had her flu vaccine today  Colonoscopy 01/2017   Mammogram 11/2016 was repeated 12/24/2017 Bone density 02/12/2018   Cardiac Risk Factors include: advanced age (>66men, >40 women);dyslipidemia;hypertensionDr. Fontaine on 04/23/2018      Objective:    Today's Vitals   08/15/18 1658  BP: 130/80  Pulse: 81  SpO2: 96%  Weight: 198 lb (89.8 kg)  Height: 5\' 7"  (1.702 m)   Body mass index is 31.01 kg/m.  Advanced Directives 08/15/2018 04/30/2017 02/15/2017 02/13/2017 02/02/2017 02/02/2017 09/27/2016  Does Patient Have a Medical Advance Directive? Yes No No No No No Yes  Type of Advance Directive - - - - - - Press photographer;Living will  Does patient want to make changes to medical advance directive? - - - - - - No - Patient declined  Copy of [place] in Chart? - - - - - - Yes  Would patient like information on creating a medical advance directive? - No - Patient declined No - Patient declined No - Patient declined No - Patient declined No - Patient declined -  Pre-existing out of facility DNR order (yellow form or pink MOST form) - - - - - - -    Current Medications (verified) Outpatient Encounter Medications as of 08/15/2018  Medication Sig  . acetaminophen (TYLENOL) 325 MG tablet Take 650 mg by mouth every 6 (six) hours as needed for mild pain or headache.  . Ascorbic Acid (VITAMIN C) 1000 MG tablet Take 1,000 mg by mouth daily.  Marland Kitchen b complex vitamins tablet Take 1 tablet by mouth daily.    . Butalbital-APAP-Caffeine 50-300-40 MG CAPS Take 1 capsule by mouth 2 (two) times daily as needed (headache).  . CALCIUM CITRATE PO Take 1,000 mg by mouth every evening.  . Cranberry 250 MG CAPS Take 500 mg by mouth 2 (two) times daily.  . diphenhydrAMINE (BENADRYL) 25 mg capsule Take 25 mg by mouth at bedtime.  Marland Kitchen estradiol (ESTRACE) 0.5 MG tablet Take 1 tablet (0.5 mg total) by mouth daily.  Marland Kitchen ezetimibe (ZETIA) 10 MG tablet Take 1 tablet (10 mg total) by mouth daily.  . fluconazole (DIFLUCAN) 150 MG tablet Take 1 tablet (150 mg total) by mouth once a week.  . hydrochlorothiazide (HYDRODIURIL) 25 MG tablet Take 1 tablet (25 mg total) by mouth daily.  Marland Kitchen LACTOBACILLUS PO Take 1 tablet by mouth daily.  Marland Kitchen loratadine-pseudoephedrine (CLARITIN-D 24-HOUR) 10-240 MG 24 hr tablet Take 1 tablet by mouth daily.  . medroxyPROGESTERone (PROVERA) 2.5 MG tablet Take 1 tablet (2.5 mg total) by mouth daily.  . Multiple Vitamins-Minerals (WOMENS 50+ MULTI VITAMIN/MIN PO) Take 1 tablet by mouth daily.  . naproxen sodium (ANAPROX) 220 MG tablet Take 220 mg by mouth 2 (two) times daily with a meal.   . omeprazole (PRILOSEC) 20 MG capsule Take 1 capsule (20 mg total) by mouth daily.  . potassium gluconate 595 MG TABS Take 1,190  mg by mouth every evening.   . prochlorperazine (COMPAZINE) 10 MG tablet Take 1 tablet (10 mg total) by mouth every 6 (six) hours as needed for nausea or vomiting.  . propranolol (INDERAL) 40 MG tablet Take 1 tablet (40 mg total) by mouth daily.  Marland Kitchen pyridoxine (B-6) 200 MG tablet Take 200 mg by mouth daily.  . nitrofurantoin, macrocrystal-monohydrate, (MACROBID) 100 MG capsule Take 1 capsule (100 mg total) by mouth 2 (two) times daily.  . predniSONE (DELTASONE) 5 MG tablet Take 1 tablet (5 mg total) by mouth daily with breakfast. (Patient not taking: Reported on 08/15/2018)  . traMADol (ULTRAM) 50 MG tablet Take 1-2 tablets (50-100 mg total) by mouth every 6 (six) hours as needed for moderate  pain. (Patient not taking: Reported on 08/15/2018)   No facility-administered encounter medications on file as of 08/15/2018.     Allergies (verified) Levaquin [levofloxacin in d5w]; Pravastatin sodium [pravachol]; Septra [sulfamethoxazole-trimethoprim]; Biaxin [clarithromycin]; Clarithromycin; Codeine; and Dust mite extract   History: Past Medical History:  Diagnosis Date  . 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 Adenomatous colon polyp 2004  . Arthritis   . Bilateral knee pain    sees Dr. Wynelle Link   . Bilateral renal cysts   . Chronic UTI (urinary tract infection)   . Complication of anesthesia   . Cough 11/30/14    cough since 11/20/14- now productine- green with green mucus out of nose- no fever  . Diverticulitis   . GERD (gastroesophageal reflux disease)   . History of kidney stones   . History of melanoma excision   . History of renal calculi   . 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 Denman George 04/30/2017, 2019 negative subtypes 16, 18/45  . Hyperlipidemia   . IBS (irritable bowel syndrome)   . Kidney stones    bilateral  . melanoma dx'd 2011   rt arm--surg only  . Menopause    sees Dr. Phineas Real  . Migraines   . Nephrolithiasis    left  . Neurogenic bladder   . Normal cardiac stress test 2012  . PONV (postoperative nausea and vomiting)   . Sepsis ([place]) 01/2016   related to infection caused by kidney stone blockage   . Tendonitis    right ankle  . Urinary urgency   . UTI (urinary tract infection)    started atb on 03-24-12   Past Surgical History:  Procedure Laterality Date  . BACK SURGERY  10/1991   L5 ruptured disc- Dr.Robinson  . CESAREAN SECTION  12/1980  . COLONOSCOPY  01/29/2017   per Dr. Henrene Pastor, adenomatous polyps,  repeat in 5 yrs  . CYSTOSCOPY W/ URETERAL STENT PLACEMENT Right 02/02/2017   Procedure: CYSTOSCOPY WITH RETROGRADE PYELOGRAM/URETERAL STENT PLACEMENT;  Surgeon: Irine Seal, MD;  Location: [facility];  Service: Urology;  Laterality: Right;  . CYSTOSCOPY WITH RETROGRADE PYELOGRAM, URETEROSCOPY AND STENT PLACEMENT Left 12/25/2012   Procedure: CYSTOSCOPY WITH RETROGRADE PYELOGRAM, URETEROSCOPY, EXTRACTION OF  LEFT STONE WITH BASKETAND STENT PLACEMENT;  Surgeon: Molli Hazard, MD;  Location: [facility];  Service: Urology;  Laterality: Left;  2.5 HRS   . CYSTOSCOPY/URETEROSCOPY/HOLMIUM LASER/STENT PLACEMENT Right 02/15/2017   Procedure: CYSTOSCOPY/URETEROSCOPY/HOLMIUM LASER/STENT EXCHANGE;  Surgeon: Ardis Hughs, MD;  Location: [facility];  Service: Urology;  Laterality: Right;  . FOOT SURGERY Bilateral 1987   per Dr. Alphonzo Cruise  . HEMATOMA EVACUATION  07/08/2012   Procedure: EVACUATION  HEMATOMA;  Surgeon: Kristeen Miss, MD;  Location: [place] [facility];  Service: Neurosurgery;  Laterality: N/A;  Cervical Seven-thoracic One Laminectomy and Decompression of Epidural Hematoma  . HEMORRHOID BANDING     June and August 2016, Dr Rosendo Gros  . KIDNEY STONE SURGERY  2005   per Dr. Reece Agar  . LEEP  07/2015   final pathology LGSIL with clear margins negative ECC  . LITHOTRIPSY  2007   x 3  . [place], at L5 per Dr. Quentin Cornwall  . MELANOMA EXCISION  2007   left upper arm per dr. Teressa Senter  . NASAL SEPTUM SURGERY  1992   per Dr. Cynda Familia  . NASAL/SINUS ENDOSCOPY  2017   per Dr. Melissa Montane   . POSTERIOR CERVICAL LAMINECTOMY  07/08/2012   Procedure: POSTERIOR CERVICAL LAMINECTOMY;  Surgeon: Kristeen Miss, MD;  Location: [place] [facility];  Service: Neurosurgery;  Laterality: N/A;  Cervical Seven- thoracic One Laminectomy and Decompression of Epidural Hematoma  . TOTAL HIP ARTHROPLASTY Right 2010   [place]  . TOTAL HIP ARTHROPLASTY Left 09/27/2016   Procedure: LEFT TOTAL HIP ARTHROPLASTY ANTERIOR APPROACH;  Surgeon: Gaynelle Arabian, MD;  Location: [facility];  Service: Orthopedics;  Laterality: Left;  . TOTAL KNEE ARTHROPLASTY Right 12/07/2014   Procedure: RIGHT TOTAL KNEE  ARTHROPLASTY;  Surgeon: [name], MD;  Location: [facility];  Service: Orthopedics;  Laterality: Right;  . TUBAL LIGATION  01/1988   Family History  Problem Relation Age of Onset  . Heart disease Mother   . Hypertension Mother   . Diabetes Mother   . Hypertension Father   . Leukemia Father   . Hypertension Sister   . Heart disease Sister   . Bladder Cancer Sister   . Stomach cancer Paternal Grandmother   . Colon cancer Neg Hx   . Esophageal cancer Neg Hx   . Rectal cancer Neg Hx    Social History   Socioeconomic History  . Marital status: Married    Spouse name: Remo Lipps  . Number of children: 1  . Years of education: Not on file  . Highest education level: Not on file  Occupational History  . Occupation: Counselling psychologist  Social Needs  . Financial resource strain: Not on file  . Food insecurity:    Worry: Not on file    Inability: Not on file  . Transportation needs:    Medical: Not on file    Non-medical: Not on file  Tobacco Use  . Smoking status: Former Smoker    Packs/day: 0.25    Last attempt to quit: 12/20/1998    Years since quitting: 19.6  . Smokeless tobacco: Never Used  Substance and Sexual Activity  . Alcohol use: No    Alcohol/week: 0.0 standard drinks  . Drug use: No  . Sexual activity: Yes    Partners: Male    Birth control/protection: Post-menopausal    Comment: 1st intercourse 70 yo-2 partners  Lifestyle  . Physical activity:    Days per week: Not on file    Minutes per session: Not on file  . Stress: Not on file  Relationships  . Social connections:    Talks on phone: Not on file    Gets together: Not on file    Attends religious service: Not on file    Active member of club or organization: Not on file    Attends meetings of clubs or organizations: Not on file    Relationship status: Not on  file  Other Topics Concern  . Not on file  Social History Narrative  . Not on file    Tobacco Counseling Counseling given: Not  Answered   Clinical Intake:     Activities of Daily Living In your present state of health, do you have any difficulty performing the following activities: 08/15/2018  Hearing? N  Vision? N  Difficulty concentrating or making decisions? N  Walking or climbing stairs? Y  Dressing or bathing? N  Doing errands, shopping? N  Preparing Food and eating ? N  Using the Toilet? N  In the past six months, have you accidently leaked urine? N  Do you have problems with loss of bowel control? N  Managing your Medications? N  Managing your Finances? N  Housekeeping or managing your Housekeeping? N  Some recent data might be hidden     Immunizations and Health Maintenance Immunization History  Administered Date(s) Administered  . Influenza Split 07/11/2012  . Influenza Whole 08/19/2010  . Influenza, High Dose Seasonal PF 07/26/2017  . Influenza,inj,quad, With Preservative 07/26/2017  . Influenza-Unspecified 07/28/2015, 07/26/2016  . Pneumococcal Conjugate-13 07/29/2015  . Pneumococcal Polysaccharide-23 05/28/2012  . Td 10/30/2001  . Tdap 05/28/2012  . Zoster 05/28/2012   Health Maintenance Due  Topic Date Due  . PNA vac Low Risk Adult (2 of 2 - PPSV23) 05/28/2017  . INFLUENZA VACCINE  05/30/2018    Patient Care Team: Laurey Morale, MD as PCP - General  Indicate any recent Medical Services you may have received from other than [facility] providers in the past year (date may be approximate).     Assessment:   This is a routine wellness examination for Masa.  Hearing/Vision screen Hearing Screening Comments: No issues Vision Screening Comments: Vision checks regular checks by Dr. Prudencio Burly   Dietary issues and exercise activities discussed: Current Exercise Habits: Home exercise routine(does not exercise due to surgeries, back and joint issues; )  Goals    . Patient Stated     Stay as healthy as possible       Depression Screen PHQ 2/9 Scores 08/15/2018 01/28/2018 01/01/2018  12/04/2017 10/24/2017 10/12/2017  PHQ - 2 Score 0 0 0 0 0 0    Fall Risk Fall Risk  08/15/2018 01/28/2018 01/01/2018 12/04/2017 10/24/2017  Falls in the past year? No No No No No     Cognitive Function: MMSE - Mini Mental State Exam 08/15/2018  Not completed: (No Data)   Ad8 score reviewed for issues:  Issues making decisions:  Less interest in hobbies / activities:  Repeats questions, stories (family complaining):  Trouble using ordinary gadgets (microwave, computer, phone):  Forgets the month or year:   Mismanaging finances:   Remembering appts:  Daily problems with thinking and/or memory: Ad8 score is=0     Screening Tests Health Maintenance  Topic Date Due  . PNA vac Low Risk Adult (2 of 2 - PPSV23) 05/28/2017  . INFLUENZA VACCINE  05/30/2018  . MAMMOGRAM  12/25/2019  . COLONOSCOPY  01/29/2022  . TETANUS/TDAP  05/28/2022  . DEXA SCAN  Completed  . Hepatitis C Screening  Completed        Plan:      PCP Notes   Health Maintenance Had PSV 23 today Had her high dose flu vaccine today  Colonoscopy 01/2017   Mammogram 11/2016 was repeated 12/24/2017 every year due to dense breast per her report   Bone density 02/12/2018    Abnormal Screens  States her urine had an  odor, but no other symptoms.  Referrals  none  Patient concerns; On microbid 09/18 after seeing Dr. Sarajane Jews  and completed therapy. Returned from trip 10/16 and now c/o of odor in ur and feels her UTI has returned.   POC ur completed and UA with Culture ordered Negative for nitrates which was documented incorrectly. Dr. Ethlyn Gallery reviewed and ordered Ua with Culture and the patient was told to come in if her symptoms increased as well as to drink more fluids. Stated she would do so. Stated her urine did not "smell" this pm as it did earlier.       Nurse Concerns; As noted   Next PCP apt 11/20/2018      I have personally reviewed and noted the following in the patient's chart:    . Medical and social history . Use of alcohol, tobacco or illicit drugs  . Current medications and supplements . Functional ability and status . Nutritional status . Physical activity . Advanced directives . List of other physicians . Hospitalizations, surgeries, and ER visits in previous 12 months . Vitals . Screenings to include cognitive, depression, and falls . Referrals and appointments  In addition, I have reviewed and discussed with patient certain preventive protocols, quality metrics, and best practice recommendations. A written personalized care plan for preventive services as well as general preventive health recommendations were provided to patient.     XJDBZ,MCEYE, RN   08/15/2018    I have read this note and agree with its contents.  Alysia Penna, MD

## 2018-08-17 LAB — URINE CULTURE
MICRO NUMBER:: 91249782
SPECIMEN QUALITY:: ADEQUATE

## 2018-08-19 ENCOUNTER — Other Ambulatory Visit: Payer: Self-pay | Admitting: Family Medicine

## 2018-08-19 MED ORDER — CEPHALEXIN 500 MG PO CAPS: 500.0000 mg | ORAL_CAPSULE | Freq: Two times a day (BID) | ORAL | 0 refills | Status: AC

## 2018-08-20 NOTE — Progress Notes (Signed)
Discussed results with patient, patient expressed understanding. Nothing further needed. ° °

## 2018-08-30 DIAGNOSIS — G5602 Carpal tunnel syndrome, left upper limb: Secondary | ICD-10-CM | POA: Diagnosis not present

## 2018-08-30 HISTORY — PX: CARPAL TUNNEL RELEASE: SHX101

## 2018-09-11 DIAGNOSIS — M79642 Pain in left hand: Secondary | ICD-10-CM | POA: Diagnosis not present

## 2018-10-04 DIAGNOSIS — N3020 Other chronic cystitis without hematuria: Secondary | ICD-10-CM | POA: Diagnosis not present

## 2018-10-04 DIAGNOSIS — N200 Calculus of kidney: Secondary | ICD-10-CM | POA: Diagnosis not present

## 2018-10-04 DIAGNOSIS — N319 Neuromuscular dysfunction of bladder, unspecified: Secondary | ICD-10-CM | POA: Diagnosis not present

## 2018-10-09 DIAGNOSIS — M25572 Pain in left ankle and joints of left foot: Secondary | ICD-10-CM | POA: Diagnosis not present

## 2018-10-09 DIAGNOSIS — M79672 Pain in left foot: Secondary | ICD-10-CM | POA: Diagnosis not present

## 2018-10-09 DIAGNOSIS — Q6689 Other  specified congenital deformities of feet: Secondary | ICD-10-CM | POA: Diagnosis not present

## 2018-10-09 DIAGNOSIS — M67962 Unspecified disorder of synovium and tendon, left lower leg: Secondary | ICD-10-CM | POA: Diagnosis not present

## 2018-10-10 DIAGNOSIS — M76822 Posterior tibial tendinitis, left leg: Secondary | ICD-10-CM | POA: Diagnosis not present

## 2018-10-10 DIAGNOSIS — M76829 Posterior tibial tendinitis, unspecified leg: Secondary | ICD-10-CM | POA: Insufficient documentation

## 2018-10-14 DIAGNOSIS — M76822 Posterior tibial tendinitis, left leg: Secondary | ICD-10-CM | POA: Diagnosis not present

## 2018-10-16 DIAGNOSIS — M76822 Posterior tibial tendinitis, left leg: Secondary | ICD-10-CM | POA: Diagnosis not present

## 2018-10-18 DIAGNOSIS — M76822 Posterior tibial tendinitis, left leg: Secondary | ICD-10-CM | POA: Diagnosis not present

## 2018-10-22 DIAGNOSIS — M76822 Posterior tibial tendinitis, left leg: Secondary | ICD-10-CM | POA: Diagnosis not present

## 2018-10-24 DIAGNOSIS — M76822 Posterior tibial tendinitis, left leg: Secondary | ICD-10-CM | POA: Diagnosis not present

## 2018-11-18 ENCOUNTER — Encounter: Payer: Self-pay | Admitting: Family Medicine

## 2018-11-18 ENCOUNTER — Ambulatory Visit (INDEPENDENT_AMBULATORY_CARE_PROVIDER_SITE_OTHER): Payer: PPO | Admitting: Family Medicine

## 2018-11-18 VITALS — BP 120/84 | HR 71 | Temp 98.9°F | Wt 197.5 lb

## 2018-11-18 DIAGNOSIS — E782 Mixed hyperlipidemia: Secondary | ICD-10-CM

## 2018-11-18 DIAGNOSIS — R8290 Unspecified abnormal findings in urine: Secondary | ICD-10-CM | POA: Diagnosis not present

## 2018-11-18 DIAGNOSIS — J019 Acute sinusitis, unspecified: Secondary | ICD-10-CM

## 2018-11-18 LAB — CBC WITH DIFFERENTIAL/PLATELET
Basophils Absolute: 0 10*3/uL (ref 0.0–0.1)
Basophils Relative: 0.1 % (ref 0.0–3.0)
Eosinophils Absolute: 0.3 10*3/uL (ref 0.0–0.7)
Eosinophils Relative: 2.8 % (ref 0.0–5.0)
HCT: 45.8 % (ref 36.0–46.0)
Hemoglobin: 15.8 g/dL — ABNORMAL HIGH (ref 12.0–15.0)
Lymphocytes Relative: 21.2 % (ref 12.0–46.0)
Lymphs Abs: 2 10*3/uL (ref 0.7–4.0)
MCHC: 34.5 g/dL (ref 30.0–36.0)
MCV: 91.9 fl (ref 78.0–100.0)
Monocytes Absolute: 1.2 10*3/uL — ABNORMAL HIGH (ref 0.1–1.0)
Monocytes Relative: 12.8 % — ABNORMAL HIGH (ref 3.0–12.0)
Neutro Abs: 6 10*3/uL (ref 1.4–7.7)
Neutrophils Relative %: 63.1 % (ref 43.0–77.0)
Platelets: 238 10*3/uL (ref 150.0–400.0)
RBC: 4.98 Mil/uL (ref 3.87–5.11)
RDW: 13.8 % (ref 11.5–15.5)
WBC: 9.5 10*3/uL (ref 4.0–10.5)

## 2018-11-18 LAB — POC URINALSYSI DIPSTICK (AUTOMATED)
Bilirubin, UA: NEGATIVE
Blood, UA: NEGATIVE
Glucose, UA: NEGATIVE
Ketones, UA: NEGATIVE
Nitrite, UA: POSITIVE
Protein, UA: POSITIVE — AB
Spec Grav, UA: 1.02 (ref 1.010–1.025)
Urobilinogen, UA: 0.2 E.U./dL
pH, UA: 6 (ref 5.0–8.0)

## 2018-11-18 LAB — HEPATIC FUNCTION PANEL
ALT: 23 U/L (ref 0–35)
AST: 22 U/L (ref 0–37)
Albumin: 4.1 g/dL (ref 3.5–5.2)
Alkaline Phosphatase: 67 U/L (ref 39–117)
Bilirubin, Direct: 0.1 mg/dL (ref 0.0–0.3)
Total Bilirubin: 0.5 mg/dL (ref 0.2–1.2)
Total Protein: 7 g/dL (ref 6.0–8.3)

## 2018-11-18 LAB — BASIC METABOLIC PANEL
BUN: 18 mg/dL (ref 6–23)
CO2: 32 mEq/L (ref 19–32)
Calcium: 9.9 mg/dL (ref 8.4–10.5)
Chloride: 100 mEq/L (ref 96–112)
Creatinine, Ser: 0.75 mg/dL (ref 0.40–1.20)
GFR: 77.19 mL/min (ref >60.00)
Glucose, Bld: 121 mg/dL — ABNORMAL HIGH (ref 70–99)
Potassium: 4 mEq/L (ref 3.5–5.1)
Sodium: 142 mEq/L (ref 135–145)

## 2018-11-18 LAB — LIPID PANEL
Cholesterol: 201 mg/dL — ABNORMAL HIGH (ref 0–200)
HDL: 45 mg/dL (ref >39.00)
NonHDL: 156.35
Total CHOL/HDL Ratio: 4
Triglycerides: 232 mg/dL — ABNORMAL HIGH (ref 0.0–149.0)
VLDL: 46.4 mg/dL — ABNORMAL HIGH (ref 0.0–40.0)

## 2018-11-18 LAB — LDL CHOLESTEROL, DIRECT: Direct LDL: 136 mg/dL

## 2018-11-18 LAB — TSH: TSH: 8.27 u[IU]/mL — ABNORMAL HIGH (ref 0.35–4.50)

## 2018-11-18 MED ORDER — DOXYCYCLINE HYCLATE 100 MG PO CAPS: 100.0000 mg | ORAL_CAPSULE | Freq: Two times a day (BID) | ORAL | 0 refills | Status: AC

## 2018-11-18 NOTE — Addendum Note (Signed)
Addended by: Gwynne Edinger on: 11/18/2018 04:40 PM   Modules accepted: Orders

## 2018-11-18 NOTE — Progress Notes (Signed)
   Subjective:    Patient ID: Victoria Mosley, female    DOB: [birth year], 68 y.o.   MRN: [id number]  HPI Here for 3 days of sinus pressure, PND, eyes having mucus DC, and coughing. No fever. 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 No fever.    Review of Systems  Constitutional: Negative.   HENT: Positive for congestion, facial swelling, postnasal drip, sinus pressure, sinus pain and sore throat. Negative for ear pain.   Eyes: Positive for discharge and redness. Negative for pain.  Respiratory: Positive for cough.        Objective:   Physical Exam Constitutional:      Appearance: Normal appearance.  HENT:     Right Ear: Tympanic membrane and ear canal normal.     Left Ear: Tympanic membrane and ear canal normal.     Nose: Nose normal.     Mouth/Throat:     Pharynx: Oropharynx is clear.  Eyes:     Comments: Both conjunctivae slightly pink   Neck:     Musculoskeletal: Normal range of motion.  Pulmonary:     Effort: Pulmonary effort is normal.     Breath sounds: Normal breath sounds.  Lymphadenopathy:     Cervical: No cervical adenopathy.  Neurological:     Mental Status: She is alert.           Assessment & Plan:  Sinusitis, treat with Doxycycline. Add Mucinex. She will contact Dr. Lucia Gaskins (ENT) to get more of the antibiotic that she uses to irrigate her sinuses.  Alysia Penna, MD

## 2018-11-18 NOTE — Addendum Note (Signed)
Addended by: Elie Confer on: 11/18/2018 04:39 PM   Modules accepted: Orders

## 2018-11-20 ENCOUNTER — Ambulatory Visit (INDEPENDENT_AMBULATORY_CARE_PROVIDER_SITE_OTHER): Payer: PPO | Admitting: Family Medicine

## 2018-11-20 ENCOUNTER — Encounter: Payer: Self-pay | Admitting: Family Medicine

## 2018-11-20 VITALS — BP 122/70 | HR 75 | Temp 99.0°F | Ht 67.0 in | Wt 197.1 lb

## 2018-11-20 DIAGNOSIS — R0600 Dyspnea, unspecified: Secondary | ICD-10-CM | POA: Diagnosis not present

## 2018-11-20 DIAGNOSIS — Z0000 Encounter for general adult medical examination without abnormal findings: Secondary | ICD-10-CM | POA: Diagnosis not present

## 2018-11-20 LAB — URINE CULTURE
MICRO NUMBER:: 78207
SPECIMEN QUALITY:: ADEQUATE

## 2018-11-20 MED ORDER — FLUCONAZOLE 150 MG PO TABS: 150.0000 mg | ORAL_TABLET | ORAL | 5 refills | Status: DC

## 2018-11-20 MED ORDER — LEVOTHYROXINE SODIUM 75 MCG PO TABS: 75.0000 ug | ORAL_TABLET | Freq: Every day | ORAL | 3 refills | Status: DC

## 2018-11-20 MED ORDER — HYDROCHLOROTHIAZIDE 25 MG PO TABS: 25.0000 mg | ORAL_TABLET | Freq: Every day | ORAL | 3 refills | Status: DC

## 2018-11-20 MED ORDER — PREDNISONE 5 MG PO TABS: 5.0000 mg | ORAL_TABLET | Freq: Every day | ORAL | 2 refills | Status: DC

## 2018-11-20 MED ORDER — TRAMADOL HCL 50 MG PO TABS: 50.0000 mg | ORAL_TABLET | Freq: Four times a day (QID) | ORAL | 5 refills | Status: DC | PRN

## 2018-11-20 MED ORDER — OMEPRAZOLE 20 MG PO CPDR: 20.0000 mg | DELAYED_RELEASE_CAPSULE | Freq: Every day | ORAL | 3 refills | Status: DC

## 2018-11-20 MED ORDER — BUTALBITAL-APAP-CAFFEINE 50-300-40 MG PO CAPS: 1.0000 | ORAL_CAPSULE | Freq: Two times a day (BID) | ORAL | 5 refills | Status: DC | PRN

## 2018-11-20 MED ORDER — PROPRANOLOL HCL 40 MG PO TABS: 40.0000 mg | ORAL_TABLET | Freq: Every day | ORAL | 3 refills | Status: DC

## 2018-11-20 MED ORDER — EZETIMIBE 10 MG PO TABS: 10.0000 mg | ORAL_TABLET | Freq: Every day | ORAL | 3 refills | Status: DC

## 2018-11-20 NOTE — Progress Notes (Signed)
Subjective:    Patient ID: Janequa Kipnis Fickle, female    DOB: 11/29/1950, 68 y.o.   MRN: [id number]  HPI Here for a well exam. She has been doing well except for some mild SOB at times. No chest pain. She was seen here a few days ago for a sinus infection and she is taking Doxycycline for this. This has been getting better day by day. She is also using daily nasal irrigations using Tobramycin that Dr. Lucia Gaskins recently prescribed for her. She had labs a few days ago showing elevated cholesterol and we are advising her to tighten up on her diet. Her TSH is high so we started her on Synthroid 75 mcg daily.    Review of Systems  Constitutional: Negative.   HENT: Positive for sinus pressure.   Eyes: Negative.   Respiratory: Positive for shortness of breath.   Cardiovascular: Negative.   Gastrointestinal: Negative.   Genitourinary: Negative for decreased urine volume, difficulty urinating, dyspareunia, dysuria, enuresis, flank pain, frequency, hematuria, pelvic pain and urgency.  Musculoskeletal: Negative.   Skin: Negative.   Neurological: Negative.   Psychiatric/Behavioral: Negative.        Objective:   Physical Exam Constitutional:      General: She is not in acute distress.    Appearance: She is well-developed.  HENT:     Head: Normocephalic and atraumatic.     Right Ear: External ear normal.     Left Ear: External ear normal.     Nose: Nose normal.     Mouth/Throat:     Pharynx: No oropharyngeal exudate.  Eyes:     General: No scleral icterus.    Conjunctiva/sclera: Conjunctivae normal.     Pupils: Pupils are equal, round, and reactive to light.  Neck:     Musculoskeletal: Normal range of motion and neck supple.     Thyroid: No thyromegaly.     Vascular: No JVD.  Cardiovascular:     Rate and Rhythm: Normal rate and regular rhythm.     Heart sounds: Normal heart sounds. No murmur. No friction rub. No gallop.      Comments: EKG is normal  Pulmonary:     Effort: Pulmonary  effort is normal. No respiratory distress.     Breath sounds: Normal breath sounds. No wheezing or rales.  Chest:     Chest wall: No tenderness.  Abdominal:     General: Bowel sounds are normal. There is no distension.     Palpations: Abdomen is soft. There is no mass.     Tenderness: There is no abdominal tenderness. There is no guarding or rebound.  Musculoskeletal: Normal range of motion.        General: No tenderness.  Lymphadenopathy:     Cervical: No cervical adenopathy.  Skin:    General: Skin is warm and dry.     Findings: No erythema or rash.  Neurological:     Mental Status: She is alert and oriented to person, place, and time.     Cranial Nerves: No cranial nerve deficit.     Motor: No abnormal muscle tone.     Coordination: Coordination normal.     Deep Tendon Reflexes: Reflexes are normal and symmetric. Reflexes normal.  Psychiatric:        Behavior: Behavior normal.        Thought Content: Thought content normal.        Judgment: Judgment normal.           Assessment &  Plan:  Well exam. We discussed diet and exercise. Recheck a thyroid panel in 90 days. Alysia Penna, MD

## 2018-11-21 ENCOUNTER — Encounter: Payer: Self-pay | Admitting: *Deleted

## 2018-11-21 DIAGNOSIS — R7989 Other specified abnormal findings of blood chemistry: Secondary | ICD-10-CM

## 2018-11-25 DIAGNOSIS — M65332 Trigger finger, left middle finger: Secondary | ICD-10-CM | POA: Diagnosis not present

## 2018-11-25 DIAGNOSIS — M79642 Pain in left hand: Secondary | ICD-10-CM | POA: Diagnosis not present

## 2018-11-25 DIAGNOSIS — M65312 Trigger thumb, left thumb: Secondary | ICD-10-CM | POA: Diagnosis not present

## 2018-11-25 DIAGNOSIS — G5603 Carpal tunnel syndrome, bilateral upper limbs: Secondary | ICD-10-CM | POA: Diagnosis not present

## 2018-11-25 DIAGNOSIS — M6530 Trigger finger, unspecified finger: Secondary | ICD-10-CM | POA: Insufficient documentation

## 2018-12-04 DIAGNOSIS — D1801 Hemangioma of skin and subcutaneous tissue: Secondary | ICD-10-CM | POA: Diagnosis not present

## 2018-12-04 DIAGNOSIS — Z85820 Personal history of malignant melanoma of skin: Secondary | ICD-10-CM | POA: Diagnosis not present

## 2018-12-04 DIAGNOSIS — D225 Melanocytic nevi of trunk: Secondary | ICD-10-CM | POA: Diagnosis not present

## 2018-12-04 DIAGNOSIS — L218 Other seborrheic dermatitis: Secondary | ICD-10-CM | POA: Diagnosis not present

## 2018-12-04 DIAGNOSIS — L821 Other seborrheic keratosis: Secondary | ICD-10-CM | POA: Diagnosis not present

## 2019-01-06 DIAGNOSIS — M65312 Trigger thumb, left thumb: Secondary | ICD-10-CM | POA: Diagnosis not present

## 2019-01-06 DIAGNOSIS — M65332 Trigger finger, left middle finger: Secondary | ICD-10-CM | POA: Diagnosis not present

## 2019-01-08 DIAGNOSIS — Q6689 Other  specified congenital deformities of feet: Secondary | ICD-10-CM | POA: Diagnosis not present

## 2019-01-08 DIAGNOSIS — M67962 Unspecified disorder of synovium and tendon, left lower leg: Secondary | ICD-10-CM | POA: Diagnosis not present

## 2019-01-09 DIAGNOSIS — Z1231 Encounter for screening mammogram for malignant neoplasm of breast: Secondary | ICD-10-CM | POA: Diagnosis not present

## 2019-01-13 DIAGNOSIS — Z87442 Personal history of urinary calculi: Secondary | ICD-10-CM | POA: Diagnosis not present

## 2019-01-13 DIAGNOSIS — R8290 Unspecified abnormal findings in urine: Secondary | ICD-10-CM | POA: Diagnosis not present

## 2019-01-13 DIAGNOSIS — N390 Urinary tract infection, site not specified: Secondary | ICD-10-CM | POA: Diagnosis not present

## 2019-01-13 DIAGNOSIS — B9620 Unspecified Escherichia coli [E. coli] as the cause of diseases classified elsewhere: Secondary | ICD-10-CM | POA: Diagnosis not present

## 2019-02-19 ENCOUNTER — Other Ambulatory Visit: Payer: Self-pay

## 2019-02-19 ENCOUNTER — Other Ambulatory Visit: Payer: Self-pay | Admitting: Family Medicine

## 2019-02-19 ENCOUNTER — Ambulatory Visit: Payer: PPO

## 2019-02-19 DIAGNOSIS — R7989 Other specified abnormal findings of blood chemistry: Secondary | ICD-10-CM

## 2019-02-19 LAB — T3, FREE: T3, Free: 3.7 pg/mL (ref 2.3–4.2)

## 2019-02-19 LAB — TSH: TSH: 1.84 u[IU]/mL (ref 0.35–4.50)

## 2019-02-19 LAB — T4, FREE: Free T4: 0.89 ng/dL (ref 0.60–1.60)

## 2019-02-19 NOTE — Telephone Encounter (Signed)
Dr. Fry please advise on refill. thanks 

## 2019-02-20 ENCOUNTER — Encounter: Payer: Self-pay | Admitting: *Deleted

## 2019-03-31 HISTORY — PX: TRIGGER FINGER RELEASE: SHX641

## 2019-04-02 DIAGNOSIS — M65312 Trigger thumb, left thumb: Secondary | ICD-10-CM | POA: Diagnosis not present

## 2019-04-02 DIAGNOSIS — M65332 Trigger finger, left middle finger: Secondary | ICD-10-CM | POA: Diagnosis not present

## 2019-04-16 DIAGNOSIS — M65332 Trigger finger, left middle finger: Secondary | ICD-10-CM | POA: Diagnosis not present

## 2019-04-16 DIAGNOSIS — M65312 Trigger thumb, left thumb: Secondary | ICD-10-CM | POA: Diagnosis not present

## 2019-04-25 ENCOUNTER — Encounter: Payer: PPO | Admitting: Gynecology

## 2019-04-30 ENCOUNTER — Other Ambulatory Visit: Payer: Self-pay

## 2019-05-01 ENCOUNTER — Encounter: Payer: Self-pay | Admitting: Gynecology

## 2019-05-01 ENCOUNTER — Ambulatory Visit (INDEPENDENT_AMBULATORY_CARE_PROVIDER_SITE_OTHER): Payer: PPO | Admitting: Gynecology

## 2019-05-01 ENCOUNTER — Other Ambulatory Visit: Payer: Self-pay

## 2019-05-01 VITALS — BP 124/80 | Ht 68.0 in | Wt 200.0 lb

## 2019-05-01 DIAGNOSIS — Z9189 Other specified personal risk factors, not elsewhere classified: Secondary | ICD-10-CM | POA: Diagnosis not present

## 2019-05-01 DIAGNOSIS — R82998 Other abnormal findings in urine: Secondary | ICD-10-CM | POA: Diagnosis not present

## 2019-05-01 DIAGNOSIS — Z9289 Personal history of other medical treatment: Secondary | ICD-10-CM | POA: Diagnosis not present

## 2019-05-01 DIAGNOSIS — M65312 Trigger thumb, left thumb: Secondary | ICD-10-CM | POA: Insufficient documentation

## 2019-05-01 DIAGNOSIS — Z01419 Encounter for gynecological examination (general) (routine) without abnormal findings: Secondary | ICD-10-CM | POA: Diagnosis not present

## 2019-05-01 DIAGNOSIS — R87810 Cervical high risk human papillomavirus (HPV) DNA test positive: Secondary | ICD-10-CM

## 2019-05-01 DIAGNOSIS — Z124 Encounter for screening for malignant neoplasm of cervix: Secondary | ICD-10-CM | POA: Diagnosis not present

## 2019-05-01 DIAGNOSIS — M8580 Other specified disorders of bone density and structure, unspecified site: Secondary | ICD-10-CM

## 2019-05-01 DIAGNOSIS — N952 Postmenopausal atrophic vaginitis: Secondary | ICD-10-CM

## 2019-05-01 DIAGNOSIS — M65332 Trigger finger, left middle finger: Secondary | ICD-10-CM | POA: Insufficient documentation

## 2019-05-01 MED ORDER — PROGESTERONE MICRONIZED 100 MG PO CAPS: 100.0000 mg | ORAL_CAPSULE | Freq: Every day | ORAL | 4 refills | Status: DC

## 2019-05-01 MED ORDER — ESTRADIOL 0.5 MG PO TABS: 0.5000 mg | ORAL_TABLET | Freq: Every day | ORAL | 4 refills | Status: DC

## 2019-05-01 NOTE — Progress Notes (Signed)
    Victoria Mosley [birth year] [id number]        68 y.o.  G1P1001 for breast and pelvic exam.  Past medical history,surgical history, problem list, medications, allergies, family history and social history were all reviewed and documented as reviewed in the EPIC chart.  ROS:  Performed with pertinent positives and negatives included in the history, assessment and plan.   Additional significant findings : None   Exam: [name] assistant Vitals:   05/01/19 0902  BP: 124/80  Weight: 200 lb (90.7 kg)  Height: 5\' 8"  (1.727 m)   Body mass index is 30.41 kg/m.  General appearance:  Normal affect, orientation and appearance. Skin: Grossly normal HEENT: Without gross lesions.  No cervical or supraclavicular adenopathy. Thyroid normal.  Lungs:  Clear without wheezing, rales or rhonchi Cardiac: RR, without RMG Abdominal:  Soft, nontender, without masses, guarding, rebound, organomegaly or hernia Breasts:  Examined lying and sitting without masses, retractions, discharge or axillary adenopathy. Pelvic:  Ext, BUS, Vagina: With atrophic changes  Cervix: High in the vaginal vault.  Pap smear/HPV done  Uterus: Difficult to palpate but no gross masses or tenderness  Adnexa: Without masses or tenderness    Anus and perineum: Normal   Rectovaginal: Normal sphincter tone without palpated masses or tenderness.    Assessment/Plan:  68 y.o. G4P1001 female for breast and pelvic exam  1. Postmenopausal/atrophic genital changes/HRT.  On estradiol 0.5 mg and Provera 2.5 mg daily.  Is doing well with good relief of her menopausal symptoms.  Discussed the issues of HRT and when to wean.  At this point the patient strongly wants to continue.  We discussed cardiovascular and bone health benefits as well as risks of thrombosis such as stroke heart attack DVT and breast cancer.  We discussed switching progesterone to micronized progesterone she wants to go ahead and try this.  Refill of estradiol 0.5 mg  and Prometrium 100 mg at bedtime x1 year prescribed.  No bleeding and patient knows to call if any bleeding. 2. History of persistent HPV and low-grade atypia.  Pap smear last year showed normal cytology but positive high risk HPV.  History of positive high risk HPV and normal cytology 2015 and 2016. ECC showed dysplastic epithelium favoring LGSIL. LEEP 2016 with LGSIL and clear margins. Pap smear 2017 normal cytology positive high risk HPV negative subtype 16, 18/45. Pap smear last year ASCUS with positive high risk HPV. Colposcopy with stenotic cervical loss unable to sample. Consultation with Dr. Everitt Amber where the patient and she had a discussion about proceeding with hysterectomy versus expectant management in this point the patient elects for expectant management.  Pap smear/HPV today. 3. Osteopenia.  DEXA 2019 T score -1.2.  No FRAX due to being on estrogen.  Plan on repeat DEXA next year. 4. Colonoscopy 2018.  Repeat at their recommended interval. 5. Mammography 12/2018.  Continue with annual mammography next year when due.  Breast exam normal today. 6. History of recurrent UTIs followed by urology.  Complaining of dark urine today.  No frequency dysuria urgency low back pain fever or chills.  Check urinalysis today. 7. Health maintenance.  No routine lab work done as patient does this elsewhere.  Follow-up 1 year, sooner as needed.   [name] MD, 9:30 AM 05/01/2019

## 2019-05-01 NOTE — Addendum Note (Signed)
Addended by: Nelva Nay on: 05/01/2019 10:28 AM   Modules accepted: Orders

## 2019-05-01 NOTE — Patient Instructions (Signed)
Follow-up in 1 year for annual exam, sooner if any issues. 

## 2019-05-03 LAB — URINALYSIS, COMPLETE W/RFL CULTURE
Bilirubin Urine: NEGATIVE
Glucose, UA: NEGATIVE
Hgb urine dipstick: NEGATIVE
Nitrites, Initial: POSITIVE — AB
RBC / HPF: NONE SEEN /HPF (ref 0–2)
Specific Gravity, Urine: 1.021 (ref 1.001–1.03)
pH: 5.5 (ref 5.0–8.0)

## 2019-05-03 LAB — URINE CULTURE
MICRO NUMBER:: 634356
SPECIMEN QUALITY:: ADEQUATE

## 2019-05-03 LAB — CULTURE INDICATED

## 2019-05-05 ENCOUNTER — Other Ambulatory Visit: Payer: Self-pay | Admitting: Gynecology

## 2019-05-05 ENCOUNTER — Encounter: Payer: Self-pay | Admitting: Gynecology

## 2019-05-05 LAB — PAP IG AND HPV HIGH-RISK: HPV DNA High Risk: DETECTED — AB

## 2019-05-05 MED ORDER — NITROFURANTOIN MONOHYD MACRO 100 MG PO CAPS: 100.0000 mg | ORAL_CAPSULE | Freq: Two times a day (BID) | ORAL | 0 refills | Status: DC

## 2019-07-24 DIAGNOSIS — M25562 Pain in left knee: Secondary | ICD-10-CM | POA: Diagnosis not present

## 2019-07-28 DIAGNOSIS — Q6689 Other  specified congenital deformities of feet: Secondary | ICD-10-CM | POA: Diagnosis not present

## 2019-07-28 DIAGNOSIS — M67962 Unspecified disorder of synovium and tendon, left lower leg: Secondary | ICD-10-CM | POA: Diagnosis not present

## 2019-08-01 ENCOUNTER — Other Ambulatory Visit: Payer: Self-pay

## 2019-08-01 ENCOUNTER — Telehealth (INDEPENDENT_AMBULATORY_CARE_PROVIDER_SITE_OTHER): Payer: PPO | Admitting: Family Medicine

## 2019-08-01 ENCOUNTER — Encounter: Payer: Self-pay | Admitting: Family Medicine

## 2019-08-01 DIAGNOSIS — N390 Urinary tract infection, site not specified: Secondary | ICD-10-CM

## 2019-08-01 MED ORDER — NITROFURANTOIN MONOHYD MACRO 100 MG PO CAPS: 100.0000 mg | ORAL_CAPSULE | Freq: Two times a day (BID) | ORAL | 0 refills | Status: DC

## 2019-08-01 NOTE — Progress Notes (Signed)
Virtual Visit via Video Note  I connected with the patient on 08/01/19 at 10:45 AM EDT by a video enabled telemedicine application and verified that I am speaking with the correct person using two identifiers.  Location patient: home Location provider:work or home office Persons participating in the virtual visit: patient, provider  I discussed the limitations of evaluation and management by telemedicine and the availability of in person appointments. The patient expressed understanding and agreed to proceed.   HPI: Here for 2 days of occasional urgency with incontinence of urine and the urine has a foul odor. No burning or back pain or fever. She had an E coli UTI in July which was treated successfully with Macrobid. She is drinking plenty of water.    ROS: See pertinent positives and negatives per HPI.  Past Medical History:  Diagnosis Date  . 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 Adenomatous colon polyp 2004  . Arthritis   . Bilateral knee pain    sees Dr. Wynelle Link   . Bilateral renal cysts   . Chronic UTI (urinary tract infection)   . Complication of anesthesia   . Cough 11/30/14    cough since 11/20/14- now productine- green with green mucus out of nose- no fever  . Diverticulitis   . GERD (gastroesophageal reflux disease)   . History of kidney stones   . History of melanoma excision   . History of renal calculi   . 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 Denman George 04/30/2017, 2019 negative subtypes 16, 18/45  . Hyperlipidemia   . IBS (irritable bowel syndrome)   . Kidney stones    bilateral  . melanoma dx'd 2011   rt arm--surg only  . Menopause    sees Dr. Phineas Real  . Migraines   . Nephrolithiasis    left  . Neurogenic bladder   . Normal cardiac stress test 2012  . PONV (postoperative nausea and vomiting)   . Sepsis ([place]) 01/2016   related to infection caused by kidney stone blockage   . Tendonitis    right  ankle  . Urinary urgency   . UTI (urinary tract infection)    started atb on 03-24-12    Past Surgical History:  Procedure Laterality Date  . BACK SURGERY  10/1991   L5 ruptured disc- Dr.Robinson  . CARPAL TUNNEL RELEASE    . CESAREAN SECTION  12/1980  . COLONOSCOPY  01/29/2017   per Dr. Henrene Pastor, adenomatous polyps,  repeat in 5 yrs  . CYSTOSCOPY W/ URETERAL STENT PLACEMENT Right 02/02/2017   Procedure: CYSTOSCOPY WITH RETROGRADE PYELOGRAM/URETERAL STENT PLACEMENT;  Surgeon: Irine Seal, MD;  Location: [facility];  Service: Urology;  Laterality: Right;  . CYSTOSCOPY WITH RETROGRADE PYELOGRAM, URETEROSCOPY AND STENT PLACEMENT Left 12/25/2012   Procedure: CYSTOSCOPY WITH RETROGRADE PYELOGRAM, URETEROSCOPY, EXTRACTION OF  LEFT STONE WITH BASKETAND STENT PLACEMENT;  Surgeon: Molli Hazard, MD;  Location: [facility];  Service: Urology;  Laterality: Left;  2.5 HRS   . CYSTOSCOPY/URETEROSCOPY/HOLMIUM LASER/STENT PLACEMENT Right 02/15/2017   Procedure: CYSTOSCOPY/URETEROSCOPY/HOLMIUM LASER/STENT EXCHANGE;  Surgeon: Ardis Hughs, MD;  Location: [facility];  Service: Urology;  Laterality: Right;  . FOOT SURGERY Bilateral 1987   per Dr. Alphonzo Cruise  . HEMATOMA EVACUATION  07/08/2012   Procedure: EVACUATION HEMATOMA;  Surgeon: Kristeen Miss, MD;  Location: [facility];  Service: Neurosurgery;  Laterality: N/A;  Cervical Seven-thoracic One Laminectomy and Decompression  of Epidural Hematoma  . HEMORRHOID BANDING     June and August 2016, Dr Rosendo Gros  . KIDNEY STONE SURGERY  2005   per Dr. Reece Agar  . LEEP  07/2015   final pathology LGSIL with clear margins negative ECC  . LITHOTRIPSY  2007   x 3  . [place], at L5 per Dr. Quentin Cornwall  . MELANOMA EXCISION  2007   left upper arm per dr. Teressa Senter  . NASAL SEPTUM SURGERY  1992   per Dr. Cynda Familia  . NASAL/SINUS ENDOSCOPY  2017   per Dr. Melissa Montane   . POSTERIOR CERVICAL LAMINECTOMY  07/08/2012   Procedure: POSTERIOR  CERVICAL LAMINECTOMY;  Surgeon: Kristeen Miss, MD;  Location: [facility];  Service: Neurosurgery;  Laterality: N/A;  Cervical Seven- thoracic One Laminectomy and Decompression of Epidural Hematoma  . TOTAL HIP ARTHROPLASTY Right 2010   [place]  . TOTAL HIP ARTHROPLASTY Left 09/27/2016   Procedure: LEFT TOTAL HIP ARTHROPLASTY ANTERIOR APPROACH;  Surgeon: Gaynelle Arabian, MD;  Location: [facility];  Service: Orthopedics;  Laterality: Left;  . TOTAL KNEE ARTHROPLASTY Right 12/07/2014   Procedure: RIGHT TOTAL KNEE ARTHROPLASTY;  Surgeon: [name], MD;  Location: [facility];  Service: Orthopedics;  Laterality: Right;  . TRIGGER FINGER RELEASE    . TUBAL LIGATION  01/1988    Family History  Problem Relation Age of Onset  . Heart disease Mother   . Hypertension Mother   . Diabetes Mother   . Hypertension Father   . Leukemia Father   . Hypertension Sister   . Heart disease Sister   . Bladder Cancer Sister   . Stomach cancer Paternal Grandmother   . Alzheimer's disease Sister   . Colon cancer Neg Hx   . Esophageal cancer Neg Hx   . Rectal cancer Neg Hx      Current Outpatient Medications:  .  acetaminophen (TYLENOL) 325 MG tablet, Take 650 mg by mouth every 6 (six) hours as needed for mild pain or headache., Disp: , Rfl:  .  Ascorbic Acid (VITAMIN C) 1000 MG tablet, Take 1,000 mg by mouth daily., Disp: , Rfl:  .  b complex vitamins tablet, Take 1 tablet by mouth daily., Disp: , Rfl:  .  CALCIUM CITRATE PO, Take 1,000 mg by mouth every evening., Disp: , Rfl:  .  Cranberry 250 MG CAPS, Take 500 mg by mouth 2 (two) times daily., Disp: , Rfl:  .  diphenhydrAMINE (BENADRYL) 25 mg capsule, Take 25 mg by mouth at bedtime., Disp: , Rfl:  .  estradiol (ESTRACE) 0.5 MG tablet, Take 1 tablet (0.5 mg total) by mouth daily., Disp: 90 tablet, Rfl: 4 .  ezetimibe (ZETIA) 10 MG tablet, Take 1 tablet (10 mg total) by mouth daily., Disp: 90 tablet, Rfl: 3 .  FIORICET 50-300-40 MG CAPS, TAKE ONE CAPSULE TWICE  A DAY AS NEEDED FOR HEADACHE, Disp: 60 capsule, Rfl: 5 .  fluconazole (DIFLUCAN) 150 MG tablet, Take 1 tablet (150 mg total) by mouth once a week. (Patient not taking: Reported on 05/01/2019), Disp: 5 tablet, Rfl: 5 .  hydrochlorothiazide (HYDRODIURIL) 25 MG tablet, Take 1 tablet (25 mg total) by mouth daily., Disp: 90 tablet, Rfl: 3 .  LACTOBACILLUS PO, Take 1 tablet by mouth daily., Disp: , Rfl:  .  levothyroxine (SYNTHROID, LEVOTHROID) 75 MCG tablet, Take 1 tablet (75 mcg total) by mouth daily., Disp: 90 tablet, Rfl: 3 .  loratadine-pseudoephedrine (CLARITIN-D 24-HOUR) 10-240 MG 24  hr tablet, Take 1 tablet by mouth daily., Disp: , Rfl:  .  medroxyPROGESTERone (PROVERA) 2.5 MG tablet, Take 1 tablet (2.5 mg total) by mouth daily., Disp: 90 tablet, Rfl: 4 .  methenamine (HIPREX) 1 g tablet, Take 1 g by mouth 2 (two) times daily with a meal., Disp: , Rfl:  .  Multiple Vitamins-Minerals (WOMENS 50+ MULTI VITAMIN/MIN PO), Take 1 tablet by mouth daily., Disp: , Rfl:  .  naproxen sodium (ANAPROX) 220 MG tablet, Take 220 mg by mouth 2 (two) times daily with a meal. , Disp: , Rfl:  .  nitrofurantoin, macrocrystal-monohydrate, (MACROBID) 100 MG capsule, Take 1 capsule (100 mg total) by mouth 2 (two) times daily., Disp: 14 capsule, Rfl: 0 .  omeprazole (PRILOSEC) 20 MG capsule, Take 1 capsule (20 mg total) by mouth daily., Disp: 90 capsule, Rfl: 3 .  potassium gluconate 595 MG TABS, Take 1,190 mg by mouth every evening. , Disp: , Rfl:  .  predniSONE (DELTASONE) 5 MG tablet, Take 1 tablet (5 mg total) by mouth daily with breakfast. (Patient not taking: Reported on 05/01/2019), Disp: 30 tablet, Rfl: 2 .  prochlorperazine (COMPAZINE) 10 MG tablet, Take 1 tablet (10 mg total) by mouth every 6 (six) hours as needed for nausea or vomiting. (Patient not taking: Reported on 05/01/2019), Disp: 30 tablet, Rfl: 0 .  progesterone (PROMETRIUM) 100 MG capsule, Take 1 capsule (100 mg total) by mouth at bedtime., Disp: 90 capsule,  Rfl: 4 .  propranolol (INDERAL) 40 MG tablet, Take 1 tablet (40 mg total) by mouth daily., Disp: 90 tablet, Rfl: 3 .  pyridoxine (B-6) 200 MG tablet, Take 200 mg by mouth daily., Disp: , Rfl:  .  traMADol (ULTRAM) 50 MG tablet, Take 1-2 tablets (50-100 mg total) by mouth every 6 (six) hours as needed for moderate pain., Disp: 60 tablet, Rfl: 5  EXAM:  VITALS per patient if applicable:  GENERAL: alert, oriented, appears well and in no acute distress  HEENT: atraumatic, conjunttiva clear, no obvious abnormalities on inspection of external nose and ears  NECK: normal movements of the head and neck  LUNGS: on inspection no signs of respiratory distress, breathing rate appears normal, no obvious gross SOB, gasping or wheezing  CV: no obvious cyanosis  MS: moves all visible extremities without noticeable abnormality  PSYCH/NEURO: pleasant and cooperative, no obvious depression or anxiety, speech and thought processing grossly intact  ASSESSMENT AND PLAN: UTI, treat with Macrobid.  Alysia Penna, MD  Discussed the following assessment and plan:  No diagnosis found.     I discussed the assessment and treatment plan with the patient. The patient was provided an opportunity to ask questions and all were answered. The patient agreed with the plan and demonstrated an understanding of the instructions.   The patient was advised to call back or seek an in-person evaluation if the symptoms worsen or if the condition fails to improve as anticipated.

## 2019-08-02 ENCOUNTER — Other Ambulatory Visit: Payer: Self-pay | Admitting: Family Medicine

## 2019-08-06 ENCOUNTER — Encounter: Payer: Self-pay | Admitting: Gynecology

## 2019-08-18 ENCOUNTER — Other Ambulatory Visit: Payer: Self-pay | Admitting: Family Medicine

## 2019-08-18 ENCOUNTER — Ambulatory Visit: Payer: PPO

## 2019-08-18 DIAGNOSIS — M76829 Posterior tibial tendinitis, unspecified leg: Secondary | ICD-10-CM | POA: Diagnosis not present

## 2019-08-19 NOTE — Telephone Encounter (Signed)
Pt husband calling to check status. Pt would like a call back from the nurse

## 2019-08-19 NOTE — Telephone Encounter (Signed)
Last filled 11/20/2018 Last OV 11/20/18  Ok to fill?

## 2019-08-22 ENCOUNTER — Other Ambulatory Visit: Payer: Self-pay | Admitting: Family Medicine

## 2019-08-22 MED ORDER — TRAMADOL HCL 50 MG PO TABS: 50.0000 mg | ORAL_TABLET | Freq: Four times a day (QID) | ORAL | 0 refills | Status: DC | PRN

## 2019-08-22 NOTE — Telephone Encounter (Signed)
Ok for refill? 

## 2019-08-22 NOTE — Telephone Encounter (Signed)
I refilled tramadol. I did not put refills as these will have to come from Dr. Sarajane Jews in future.

## 2019-08-25 ENCOUNTER — Ambulatory Visit (INDEPENDENT_AMBULATORY_CARE_PROVIDER_SITE_OTHER): Payer: PPO | Admitting: *Deleted

## 2019-08-25 ENCOUNTER — Other Ambulatory Visit: Payer: Self-pay

## 2019-08-25 ENCOUNTER — Ambulatory Visit: Payer: PPO

## 2019-08-25 DIAGNOSIS — Z23 Encounter for immunization: Secondary | ICD-10-CM | POA: Diagnosis not present

## 2019-08-26 ENCOUNTER — Ambulatory Visit (INDEPENDENT_AMBULATORY_CARE_PROVIDER_SITE_OTHER): Payer: PPO | Admitting: Family Medicine

## 2019-08-26 ENCOUNTER — Encounter: Payer: Self-pay | Admitting: Family Medicine

## 2019-08-26 VITALS — BP 120/70 | Wt 195.0 lb

## 2019-08-26 DIAGNOSIS — E785 Hyperlipidemia, unspecified: Secondary | ICD-10-CM | POA: Diagnosis not present

## 2019-08-26 DIAGNOSIS — Z0000 Encounter for general adult medical examination without abnormal findings: Secondary | ICD-10-CM

## 2019-08-26 DIAGNOSIS — R739 Hyperglycemia, unspecified: Secondary | ICD-10-CM

## 2019-08-26 NOTE — Progress Notes (Signed)
Medicare Annual Preventive Care Visit  (initial annual wellness or annual wellness exam)  Virtual Visit via Video Note  I connected with Victoria Mosley  on 08/26/19 by a video enabled telemedicine application and verified that I am speaking with the correct person using two identifiers.  Location patient: home Location provider:work or home office Persons participating in the virtual visit: patient, provider  Concerns and/or follow up today:  Victoria Mosley is a pleasant 68 yo with a PMH Allergies, hyperlipidemia, hyperglycemia, migraines, IBS, GERD, OAB, OA and Brown sequard syndrome. She sees Dr. Alysia Penna for her primary care.  She plans to get her labs in January with PCP CPE. No exercise. She struggles with orthopedic issues which makes exercise difficult.   See HM section in Epic for other details of completed HM. See scanned documentation under Media Tab for further documentation HPI, health risk assessment. See Media Tab and Care Teams sections in Epic for other providers.  ROS: negative for report of fevers, unintentional weight loss, vision changes, vision loss, hearing loss or change, chest pain, sob, hemoptysis, melena, hematochezia, hematuria, genital discharge or lesions, falls, bleeding or bruising, loc, thoughts of suicide or self harm, memory loss  1.) Patient-completed health risk assessment  - completed and reviewed, see scanned documentation  2.) Review of Medical History: -PMH, PSH, Family History and current specialty and care providers reviewed and updated and listed below  - see scanned in document in chart and below Patient Care Team: Laurey Morale, MD as PCP - General Louis Meckel Viona Gilmore, MD as Attending Physician (Urology) Phineas Real, Belinda Block, MD as Consulting Physician (Gynecology) Irene Shipper, MD as Consulting Physician (Gastroenterology) [name], MD as Consulting Physician (Orthopedic Surgery) Gaynelle Arabian, MD as Consulting Physician (Orthopedic  Surgery) Marijean Niemann (Dermatology)   Past Medical History:  Diagnosis Date  . 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 Adenomatous colon polyp 2004  . Arthritis   . Bilateral knee pain    sees Dr. Wynelle Link   . Bilateral renal cysts   . Chronic UTI (urinary tract infection)   . Complication of anesthesia   . Cough 11/30/14    cough since 11/20/14- now productine- green with green mucus out of nose- no fever  . Diverticulitis   . GERD (gastroesophageal reflux disease)   . History of kidney stones   . History of melanoma excision   . History of renal calculi   . 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 Denman George 04/30/2017, 2019 negative subtypes 16, 18/45  . Hyperlipidemia   . IBS (irritable bowel syndrome)   . Kidney stones    bilateral  . melanoma dx'd 2011   rt arm--surg only  . Menopause    sees Dr. Phineas Real  . Migraines   . Nephrolithiasis    left  . Neurogenic bladder   . Normal cardiac stress test 2012  . PONV (postoperative nausea and vomiting)   . Sepsis ([place]) 01/2016   related to infection caused by kidney stone blockage   . Tendonitis    right ankle  . Urinary urgency   . UTI (urinary tract infection)    started atb on 03-24-12    Past Surgical History:  Procedure Laterality Date  . BACK SURGERY  10/1991   L5 ruptured disc- Dr.Robinson  . CARPAL TUNNEL RELEASE    . CESAREAN SECTION  12/1980  . COLONOSCOPY  01/29/2017  per Dr. Henrene Pastor, adenomatous polyps,  repeat in 5 yrs  . CYSTOSCOPY W/ URETERAL STENT PLACEMENT Right 02/02/2017   Procedure: CYSTOSCOPY WITH RETROGRADE PYELOGRAM/URETERAL STENT PLACEMENT;  Surgeon: Irine Seal, MD;  Location: [facility];  Service: Urology;  Laterality: Right;  . CYSTOSCOPY WITH RETROGRADE PYELOGRAM, URETEROSCOPY AND STENT PLACEMENT Left 12/25/2012   Procedure: CYSTOSCOPY WITH RETROGRADE PYELOGRAM, URETEROSCOPY, EXTRACTION OF  LEFT STONE WITH BASKETAND STENT PLACEMENT;   Surgeon: Molli Hazard, MD;  Location: [facility];  Service: Urology;  Laterality: Left;  2.5 HRS   . CYSTOSCOPY/URETEROSCOPY/HOLMIUM LASER/STENT PLACEMENT Right 02/15/2017   Procedure: CYSTOSCOPY/URETEROSCOPY/HOLMIUM LASER/STENT EXCHANGE;  Surgeon: Ardis Hughs, MD;  Location: [facility];  Service: Urology;  Laterality: Right;  . FOOT SURGERY Bilateral 1987   per Dr. Alphonzo Cruise  . HEMATOMA EVACUATION  07/08/2012   Procedure: EVACUATION HEMATOMA;  Surgeon: Kristeen Miss, MD;  Location: [place] [facility];  Service: Neurosurgery;  Laterality: N/A;  Cervical Seven-thoracic One Laminectomy and Decompression of Epidural Hematoma  . HEMORRHOID BANDING     June and August 2016, Dr Rosendo Gros  . KIDNEY STONE SURGERY  2005   per Dr. Reece Agar  . LEEP  07/2015   final pathology LGSIL with clear margins negative ECC  . LITHOTRIPSY  2007   x 3  . [place], at L5 per Dr. Quentin Cornwall  . MELANOMA EXCISION  2007   left upper arm per dr. Teressa Senter  . NASAL SEPTUM SURGERY  1992   per Dr. Cynda Familia  . NASAL/SINUS ENDOSCOPY  2017   per Dr. Melissa Montane   . POSTERIOR CERVICAL LAMINECTOMY  07/08/2012   Procedure: POSTERIOR CERVICAL LAMINECTOMY;  Surgeon: Kristeen Miss, MD;  Location: [place];  Service: Neurosurgery;  Laterality: N/A;  Cervical Seven- thoracic One Laminectomy and Decompression of Epidural Hematoma  . TOTAL HIP ARTHROPLASTY Right 2010   [place]  . TOTAL HIP ARTHROPLASTY Left 09/27/2016   Procedure: LEFT TOTAL HIP ARTHROPLASTY ANTERIOR APPROACH;  Surgeon: Gaynelle Arabian, MD;  Location: [facility];  Service: Orthopedics;  Laterality: Left;  . TOTAL KNEE ARTHROPLASTY Right 12/07/2014   Procedure: RIGHT TOTAL KNEE ARTHROPLASTY;  Surgeon: [name], MD;  Location: [facility];  Service: Orthopedics;  Laterality: Right;  . TRIGGER FINGER RELEASE    . TUBAL LIGATION  01/1988    Social History   Socioeconomic History  . Marital status: Married    Spouse name: Remo Lipps  .  Number of children: 1  . Years of education: Not on file  . Highest education level: Not on file  Occupational History  . Occupation: Counselling psychologist  Social Needs  . Financial resource strain: Not on file  . Food insecurity    Worry: Not on file    Inability: Not on file  . Transportation needs    Medical: Not on file    Non-medical: Not on file  Tobacco Use  . Smoking status: Former Smoker    Packs/day: 0.25    Quit date: 12/20/1998    Years since quitting: 20.6  . Smokeless tobacco: Never Used  Substance and Sexual Activity  . Alcohol use: No    Alcohol/week: 0.0 standard drinks  . Drug use: No  . Sexual activity: Yes    Partners: Male    Birth control/protection: Post-menopausal    Comment: 1st intercourse 82 yo-2 partners  Lifestyle  . Physical activity    Days per week: Not on file    Minutes per session: Not on  file  . Stress: Not on file  Relationships  . Social Herbalist on phone: Not on file    Gets together: Not on file    Attends religious service: Not on file    Active member of club or organization: Not on file    Attends meetings of clubs or organizations: Not on file    Relationship status: Not on file  . Intimate partner violence    Fear of current or ex partner: Not on file    Emotionally abused: Not on file    Physically abused: Not on file    Forced sexual activity: Not on file  Other Topics Concern  . Not on file  Social History Narrative  . Not on file    Family History  Problem Relation Age of Onset  . Heart disease Mother   . Hypertension Mother   . Diabetes Mother   . Hypertension Father   . Leukemia Father   . Hypertension Sister   . Heart disease Sister   . Bladder Cancer Sister   . Stomach cancer Paternal Grandmother   . Alzheimer's disease Sister   . Colon cancer Neg Hx   . Esophageal cancer Neg Hx   . Rectal cancer Neg Hx     Current Outpatient Medications on File Prior to Visit  Medication Sig Dispense  Refill  . AMOXICILLIN PO Take 500 mg by mouth. Takes 2000mg  prior to dental procedures only    . Ascorbic Acid (VITAMIN C) 1000 MG tablet Take 1,000 mg by mouth daily.    Marland Kitchen b complex vitamins tablet Take 1 tablet by mouth daily.    Marland Kitchen CALCIUM CITRATE PO Take 1,000 mg by mouth every evening.    . Cranberry 250 MG CAPS Take 500 mg by mouth 2 (two) times daily.    . diphenhydrAMINE (BENADRYL) 25 mg capsule Take 25 mg by mouth at bedtime.    Marland Kitchen DOCUSATE SODIUM PO Take 100 mg by mouth at bedtime.    Marland Kitchen estradiol (ESTRACE) 0.5 MG tablet Take 1 tablet (0.5 mg total) by mouth daily. 90 tablet 4  . ezetimibe (ZETIA) 10 MG tablet Take 1 tablet (10 mg total) by mouth daily. 90 tablet 3  . FIORICET 50-300-40 MG CAPS TAKE ONE CAPSULE TWICE A DAY AS NEEDED FOR HEADACHE 60 capsule 5  . hydrochlorothiazide (HYDRODIURIL) 25 MG tablet Take 1 tablet (25 mg total) by mouth daily. 90 tablet 3  . LACTOBACILLUS PO Take 1 tablet by mouth daily.    Marland Kitchen levothyroxine (SYNTHROID, LEVOTHROID) 75 MCG tablet Take 1 tablet (75 mcg total) by mouth daily. 90 tablet 3  . loratadine (CLARITIN) 10 MG tablet Take 10 mg by mouth daily.    . methenamine (HIPREX) 1 g tablet Take 1 g by mouth 2 (two) times daily with a meal.    . Multiple Vitamins-Minerals (WOMENS 50+ MULTI VITAMIN/MIN PO) Take 1 tablet by mouth daily.    . naproxen sodium (ANAPROX) 220 MG tablet Take 220 mg by mouth daily.     Marland Kitchen omeprazole (PRILOSEC) 20 MG capsule Take 1 capsule (20 mg total) by mouth daily. 90 capsule 3  . potassium gluconate 595 MG TABS Take 1,190 mg by mouth every evening.     . predniSONE (DELTASONE) 5 MG tablet Take 1 tablet (5 mg total) by mouth daily with breakfast. 30 tablet 2  . prochlorperazine (COMPAZINE) 10 MG tablet Take 1 tablet (10 mg total) by mouth every 6 (six) hours  as needed for nausea or vomiting. 30 tablet 0  . progesterone (PROMETRIUM) 100 MG capsule Take 1 capsule (100 mg total) by mouth at bedtime. 90 capsule 4  . propranolol  (INDERAL) 40 MG tablet Take 1 tablet (40 mg total) by mouth daily. 90 tablet 3  . pyridoxine (B-6) 200 MG tablet Take 200 mg by mouth daily.    . traMADol (ULTRAM) 50 MG tablet Take 1-2 tablets (50-100 mg total) by mouth every 6 (six) hours as needed for moderate pain. 60 tablet 0   No current facility-administered medications on file prior to visit.      3.) Review of functional ability and level of safety:  Any difficulty hearing?  See scanned documentation  History of falling?  See scanned documentation - she did have one because was walking in the sandles on from pedicure and slipped, no lasting effects. No concerns for balance or fall risks.  Any trouble with IADLs - using a phone, using transportation, grocery shopping, preparing meals, doing housework, doing laundry, taking medications and managing money?  See scanned documentation  Advance Directives? yes  See summary of recommendations in Patient Instructions below.  4.) Physical Exam Vitals:   08/26/19 1528  BP: 120/70   Estimated body mass index is 29.65 kg/m as calculated from the following:   Height as of 05/01/19: 5\' 8"  (1.727 m).   Weight as of this encounter: 195 lb (88.5 kg).  Body mass index is 29.65 kg/m.   EKG (optional): deferred  VITALS per patient if applicable:  GENERAL: alert, oriented, appears well and in no acute distress; visual acuity grossly intact, full vision exam deferred due to pandemic and/or virtual encounter  HEENT: atraumatic, conjunttiva clear, no obvious abnormalities on inspection of external nose and ears  NECK: normal movements of the head and neck  LUNGS: on inspection no signs of respiratory distress, breathing rate appears normal, no obvious gross SOB, gasping or wheezing  CV: no obvious cyanosis  MS: moves all visible extremities without noticeable abnormality  PSYCH/NEURO: pleasant and cooperative, no obvious depression or anxiety, speech and thought processing  grossly intact, Cognitive function grossly intact  Depression screen [facility] 2/9 08/26/2019 11/20/2018 08/15/2018 01/28/2018 01/01/2018  Decreased Interest 0 0 0 0 0  Down, Depressed, Hopeless 0 0 0 0 0  PHQ - 2 Score 0 0 0 0 0  Some recent data might be hidden     See patient instructions for recommendations.  Education and counseling regarding the above review of health provided with a plan for the following: -see scanned patient completed form for further details -fall prevention strategies discussed - she feels stable on her feet -healthy lifestyle discussed - discussed chair exercise and healthy diet -importance and resources for completing advanced directives discussed - she has already completed -see patient instructions below for any other recommendations provided  4)The following written screening schedule of preventive measures were reviewed with assessment and plan made per below, orders and patient instructions:      AAA screening done if applicable     Alcohol screening done - none     Obesity Screening and counseling done at length     STI screening (Hep C if born 1944-65) offered and per pt wishes, declined today     Tobacco Screening done -none       Pneumococcal (PPSV23 -one dose after 64, one before if risk factors), influenza yearly and hepatitis B vaccines (if high risk - end stage renal disease, IV drugs,  homosexual men, live in home for mentally retarded, hemophilia receiving factors) ASSESSMENT/PLAN: done      Screening mammograph (yearly if >40) ASSESSMENT/PLAN: utd, 12/2018      Screening Pap smear/pelvic exam (q2 years) ASSESSMENT/PLAN: n/a, declined - does with gyn, done in last 1 year      Colorectal cancer screening (FOBT yearly or flex sig q4y or colonoscopy q10y or barium enema q4y) ASSESSMENT/PLAN: utd, sees Dr. Henrene Pastor, done in 2018 per patient and reports told to return in  2023      Diabetes outpatient self-management training services ASSESSMENT/PLAN:  utd or done - she plans to recheck at CPE in January, declined today      Bone mass measurements(covered q2y if indicated - estrogen def, osteoporosis, hyperparathyroid, vertebral abnormalities, osteoporosis or steroids) ASSESSMENT/PLAN: utd or discussed and ordered per pt wishes      Screening for glaucoma(q1y if high risk - diabetes, FH, AA and > 50 or hispanic and > 65) ASSESSMENT/PLAN: utd or advised - sees Dr. Gillian Scarce per patient      Medical nutritional therapy for individuals with diabetes or renal disease ASSESSMENT/PLAN: n/a      Cardiovascular screening blood tests (lipids q5y) ASSESSMENT/PLAN: she plans to do at her CPE with her PCP in January      Diabetes screening tests ASSESSMENT/PLAN: she plans to do labs at CPE, declined today   7.) Summary:  Medicare annual wellness visit, subsequent -risk factors and conditions per above assessment were discussed and treatment, recommendations and referrals were offered per documentation above and orders and patient instructions. -she is going to consider shinrix and will get at the pharmacy if decides to get it  Hyperlipidemia, unspecified hyperlipidemia type Hyperglycemia Overweight - BMI 29 -lifestyle recs - may try chair exercise - struggles with numerous orthopedic issues -diet discussed at weight, advised a healthy low sugar whole foods based diet such as the mediterreanean diet -discussed labs and offered lipids and blood sugar check, she prefers to do when comes for CPE in January with her PCP  Discussed method to reduced the spread of COVID 19, masking, social distancing, not gathering and good hand hygeine  Patient Instructions    Victoria Mosley , Thank you for taking time to come for your Medicare Wellness Visit. I appreciate your ongoing commitment to your health goals. Please review the following plan we discussed and let me know if I can assist you in the future.   These are the goals we discussed:    -eat a  healthy low sugar whole foods based diet such as the [place] -try chair exercises, lots of videos available online -consider the new shingles vaccine -labs with your physical in January - please schedule  This is a list of the screening recommended for you and due dates:  Health Maintenance  Topic Date Due  . Mammogram  01/08/2021  . Colon Cancer Screening  01/29/2022  . Tetanus Vaccine  05/28/2022  . Flu Shot  Completed  . DEXA scan (bone density measurement)  Completed  .  Hepatitis C: One time screening is recommended by Center for Disease Control  (CDC) for  adults born from [birth year] through 1964.   Completed  . Pneumonia vaccines  Completed    Lucretia Kern, DO  Patient Instructions    Victoria Mosley , Thank you for taking time to come for your Medicare Wellness Visit. I appreciate your ongoing commitment to your health goals. Please review the following plan we discussed and let  me know if I can assist you in the future.   These are the goals we discussed:    -eat a healthy low sugar whole foods based diet such as the [place] -try chair exercises, lots of videos available online -consider the new shingles vaccine -labs with your physical in January - please schedule  This is a list of the screening recommended for you and due dates:  Health Maintenance  Topic Date Due  . Mammogram  01/08/2021  . Colon Cancer Screening  01/29/2022  . Tetanus Vaccine  05/28/2022  . Flu Shot  Completed  . DEXA scan (bone density measurement)  Completed  .  Hepatitis C: One time screening is recommended by Center for Disease Control  (CDC) for  adults born from [birth year] through 1964.   Completed  . Pneumonia vaccines  Completed

## 2019-08-26 NOTE — Patient Instructions (Addendum)
  Ms. Feighner , Thank you for taking time to come for your Medicare Wellness Visit. I appreciate your ongoing commitment to your health goals. Please review the following plan we discussed and let me know if I can assist you in the future.   These are the goals we discussed:    -eat a healthy low sugar whole foods based diet such as the [place] -try chair exercises, lots of videos available online -consider the new shingles vaccine -labs with your physical in January - please schedule  This is a list of the screening recommended for you and due dates:  Health Maintenance  Topic Date Due  . Mammogram  01/08/2021  . Colon Cancer Screening  01/29/2022  . Tetanus Vaccine  05/28/2022  . Flu Shot  Completed  . DEXA scan (bone density measurement)  Completed  .  Hepatitis C: One time screening is recommended by Center for Disease Control  (CDC) for  adults born from [birth year] through 1964.   Completed  . Pneumonia vaccines  Completed

## 2019-08-28 DIAGNOSIS — M76829 Posterior tibial tendinitis, unspecified leg: Secondary | ICD-10-CM | POA: Diagnosis not present

## 2019-08-29 NOTE — Progress Notes (Signed)
Call pt and left a message to call back to make an appt for her cpx

## 2019-09-01 DIAGNOSIS — M76829 Posterior tibial tendinitis, unspecified leg: Secondary | ICD-10-CM | POA: Diagnosis not present

## 2019-09-05 DIAGNOSIS — M76829 Posterior tibial tendinitis, unspecified leg: Secondary | ICD-10-CM | POA: Diagnosis not present

## 2019-09-10 DIAGNOSIS — M76829 Posterior tibial tendinitis, unspecified leg: Secondary | ICD-10-CM | POA: Diagnosis not present

## 2019-09-12 DIAGNOSIS — M76829 Posterior tibial tendinitis, unspecified leg: Secondary | ICD-10-CM | POA: Diagnosis not present

## 2019-09-15 DIAGNOSIS — M76829 Posterior tibial tendinitis, unspecified leg: Secondary | ICD-10-CM | POA: Diagnosis not present

## 2019-09-17 DIAGNOSIS — M76829 Posterior tibial tendinitis, unspecified leg: Secondary | ICD-10-CM | POA: Diagnosis not present

## 2019-10-16 ENCOUNTER — Other Ambulatory Visit: Payer: Self-pay | Admitting: Family Medicine

## 2019-10-28 ENCOUNTER — Telehealth (INDEPENDENT_AMBULATORY_CARE_PROVIDER_SITE_OTHER): Payer: PPO | Admitting: Family Medicine

## 2019-10-28 ENCOUNTER — Other Ambulatory Visit: Payer: Self-pay

## 2019-10-28 ENCOUNTER — Telehealth: Payer: Self-pay

## 2019-10-28 ENCOUNTER — Telehealth: Payer: Self-pay | Admitting: *Deleted

## 2019-10-28 ENCOUNTER — Telehealth (INDEPENDENT_AMBULATORY_CARE_PROVIDER_SITE_OTHER): Payer: Self-pay

## 2019-10-28 DIAGNOSIS — J0191 Acute recurrent sinusitis, unspecified: Secondary | ICD-10-CM

## 2019-10-28 DIAGNOSIS — J320 Chronic maxillary sinusitis: Secondary | ICD-10-CM

## 2019-10-28 MED ORDER — DOXYCYCLINE HYCLATE 100 MG PO CAPS: 100.0000 mg | ORAL_CAPSULE | Freq: Two times a day (BID) | ORAL | 0 refills | Status: AC

## 2019-10-28 NOTE — Telephone Encounter (Signed)
Copied from [place] [phone]. Topic: Appointment Scheduling - Scheduling Inquiry for Clinic >> Oct 28, 2019 10:31 AM Greggory Keen D wrote: Reason for CRM: pt called saying she is in [facility].  She is having a bad sinus infection and wants to know if she can get a virtual appt so she can get an antibiotic.  She will not be back until Jan 12th from [place].  CB#  [phone]

## 2019-10-28 NOTE — Progress Notes (Signed)
Virtual Visit via Video Note  I connected with the patient on 10/28/19 at 11:15 AM EST by a video enabled telemedicine application and verified that I am speaking with the correct person using two identifiers.  Location patient: home Location provider:work or home office Persons participating in the virtual visit: patient, provider  I discussed the limitations of evaluation and management by telemedicine and the availability of in person appointments. The patient expressed understanding and agreed to proceed.   HPI: Here for another of her typical sinus infections. She and her husband are currently in [place], [place] so he can play golf. For the past 4 days she has had pressure and pain in the left side of her face with stuffy head and PND. No fever or cough or SOB. No body aches or NVD. Using her Nety pot.    ROS: See pertinent positives and negatives per HPI.  Past Medical History:  Diagnosis Date  . 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 Adenomatous colon polyp 2004  . Arthritis   . Bilateral knee pain    sees Dr. Wynelle Link   . Bilateral renal cysts   . Chronic UTI (urinary tract infection)   . Complication of anesthesia   . Cough 11/30/14    cough since 11/20/14- now productine- green with green mucus out of nose- no fever  . Diverticulitis   . GERD (gastroesophageal reflux disease)   . History of kidney stones   . History of melanoma excision   . History of renal calculi   . 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 Denman George 04/30/2017, 2019 negative subtypes 16, 18/45  . Hyperlipidemia   . IBS (irritable bowel syndrome)   . Kidney stones    bilateral  . melanoma dx'd 2011   rt arm--surg only  . Menopause    sees Dr. Phineas Real  . Migraines   . Nephrolithiasis    left  . Neurogenic bladder   . Normal cardiac stress test 2012  . PONV (postoperative nausea and vomiting)   . Sepsis ([place]) 01/2016   related to  infection caused by kidney stone blockage   . Tendonitis    right ankle  . Urinary urgency   . UTI (urinary tract infection)    started atb on 03-24-12    Past Surgical History:  Procedure Laterality Date  . BACK SURGERY  10/1991   L5 ruptured disc- Dr.Robinson  . CARPAL TUNNEL RELEASE    . CESAREAN SECTION  12/1980  . COLONOSCOPY  01/29/2017   per Dr. Henrene Pastor, adenomatous polyps,  repeat in 5 yrs  . CYSTOSCOPY W/ URETERAL STENT PLACEMENT Right 02/02/2017   Procedure: CYSTOSCOPY WITH RETROGRADE PYELOGRAM/URETERAL STENT PLACEMENT;  Surgeon: Irine Seal, MD;  Location: [facility];  Service: Urology;  Laterality: Right;  . CYSTOSCOPY WITH RETROGRADE PYELOGRAM, URETEROSCOPY AND STENT PLACEMENT Left 12/25/2012   Procedure: CYSTOSCOPY WITH RETROGRADE PYELOGRAM, URETEROSCOPY, EXTRACTION OF  LEFT STONE WITH BASKETAND STENT PLACEMENT;  Surgeon: Molli Hazard, MD;  Location: [facility];  Service: Urology;  Laterality: Left;  2.5 HRS   . CYSTOSCOPY/URETEROSCOPY/HOLMIUM LASER/STENT PLACEMENT Right 02/15/2017   Procedure: CYSTOSCOPY/URETEROSCOPY/HOLMIUM LASER/STENT EXCHANGE;  Surgeon: Ardis Hughs, MD;  Location: [facility];  Service: Urology;  Laterality: Right;  . FOOT SURGERY Bilateral 1987   per Dr. Alphonzo Cruise  . HEMATOMA EVACUATION  07/08/2012   Procedure: EVACUATION HEMATOMA;  Surgeon: Kristeen Miss, MD;  Location:  [facility];  Service: Neurosurgery;  Laterality: N/A;  Cervical Seven-thoracic One Laminectomy and Decompression of Epidural Hematoma  . HEMORRHOID BANDING     June and August 2016, Dr Rosendo Gros  . KIDNEY STONE SURGERY  2005   per Dr. Reece Agar  . LEEP  07/2015   final pathology LGSIL with clear margins negative ECC  . LITHOTRIPSY  2007   x 3  . [place], at L5 per Dr. Quentin Cornwall  . MELANOMA EXCISION  2007   left upper arm per dr. Teressa Senter  . NASAL SEPTUM SURGERY  1992   per Dr. Cynda Familia  . NASAL/SINUS ENDOSCOPY  2017   per Dr. Melissa Montane   .  POSTERIOR CERVICAL LAMINECTOMY  07/08/2012   Procedure: POSTERIOR CERVICAL LAMINECTOMY;  Surgeon: Kristeen Miss, MD;  Location: [facility];  Service: Neurosurgery;  Laterality: N/A;  Cervical Seven- thoracic One Laminectomy and Decompression of Epidural Hematoma  . TOTAL HIP ARTHROPLASTY Right 2010   [place]  . TOTAL HIP ARTHROPLASTY Left 09/27/2016   Procedure: LEFT TOTAL HIP ARTHROPLASTY ANTERIOR APPROACH;  Surgeon: Gaynelle Arabian, MD;  Location: [facility];  Service: Orthopedics;  Laterality: Left;  . TOTAL KNEE ARTHROPLASTY Right 12/07/2014   Procedure: RIGHT TOTAL KNEE ARTHROPLASTY;  Surgeon: [name], MD;  Location: [facility];  Service: Orthopedics;  Laterality: Right;  . TRIGGER FINGER RELEASE    . TUBAL LIGATION  01/1988    Family History  Problem Relation Age of Onset  . Heart disease Mother   . Hypertension Mother   . Diabetes Mother   . Hypertension Father   . Leukemia Father   . Hypertension Sister   . Heart disease Sister   . Bladder Cancer Sister   . Stomach cancer Paternal Grandmother   . Alzheimer's disease Sister   . Colon cancer Neg Hx   . Esophageal cancer Neg Hx   . Rectal cancer Neg Hx      Current Outpatient Medications:  .  AMOXICILLIN PO, Take 500 mg by mouth. Takes 2000mg  prior to dental procedures only, Disp: , Rfl:  .  Ascorbic Acid (VITAMIN C) 1000 MG tablet, Take 1,000 mg by mouth daily., Disp: , Rfl:  .  b complex vitamins tablet, Take 1 tablet by mouth daily., Disp: , Rfl:  .  CALCIUM CITRATE PO, Take 1,000 mg by mouth every evening., Disp: , Rfl:  .  Cranberry 250 MG CAPS, Take 500 mg by mouth 2 (two) times daily., Disp: , Rfl:  .  diphenhydrAMINE (BENADRYL) 25 mg capsule, Take 25 mg by mouth at bedtime., Disp: , Rfl:  .  DOCUSATE SODIUM PO, Take 100 mg by mouth at bedtime., Disp: , Rfl:  .  doxycycline (VIBRAMYCIN) 100 MG capsule, Take 1 capsule (100 mg total) by mouth 2 (two) times daily for 14 days., Disp: 28 capsule, Rfl: 0 .  estradiol (ESTRACE)  0.5 MG tablet, Take 1 tablet (0.5 mg total) by mouth daily., Disp: 90 tablet, Rfl: 4 .  ezetimibe (ZETIA) 10 MG tablet, Take 1 tablet (10 mg total) by mouth daily., Disp: 90 tablet, Rfl: 3 .  FIORICET 50-300-40 MG CAPS, TAKE ONE CAPSULE TWICE A DAY AS NEEDED FOR HEADACHE, Disp: 60 capsule, Rfl: 5 .  hydrochlorothiazide (HYDRODIURIL) 25 MG tablet, Take 1 tablet (25 mg total) by mouth daily., Disp: 90 tablet, Rfl: 3 .  LACTOBACILLUS PO, Take 1 tablet by mouth daily., Disp: , Rfl:  .  levothyroxine (SYNTHROID) 75 MCG tablet,  Take 1 tablet by mouth once daily, Disp: 90 tablet, Rfl: 0 .  loratadine (CLARITIN) 10 MG tablet, Take 10 mg by mouth daily., Disp: , Rfl:  .  methenamine (HIPREX) 1 g tablet, Take 1 g by mouth 2 (two) times daily with a meal., Disp: , Rfl:  .  Multiple Vitamins-Minerals (WOMENS 50+ MULTI VITAMIN/MIN PO), Take 1 tablet by mouth daily., Disp: , Rfl:  .  naproxen sodium (ANAPROX) 220 MG tablet, Take 220 mg by mouth daily. , Disp: , Rfl:  .  omeprazole (PRILOSEC) 20 MG capsule, Take 1 capsule (20 mg total) by mouth daily., Disp: 90 capsule, Rfl: 3 .  potassium gluconate 595 MG TABS, Take 1,190 mg by mouth every evening. , Disp: , Rfl:  .  predniSONE (DELTASONE) 5 MG tablet, Take 1 tablet (5 mg total) by mouth daily with breakfast., Disp: 30 tablet, Rfl: 2 .  prochlorperazine (COMPAZINE) 10 MG tablet, Take 1 tablet (10 mg total) by mouth every 6 (six) hours as needed for nausea or vomiting., Disp: 30 tablet, Rfl: 0 .  progesterone (PROMETRIUM) 100 MG capsule, Take 1 capsule (100 mg total) by mouth at bedtime., Disp: 90 capsule, Rfl: 4 .  propranolol (INDERAL) 40 MG tablet, Take 1 tablet (40 mg total) by mouth daily., Disp: 90 tablet, Rfl: 3 .  pyridoxine (B-6) 200 MG tablet, Take 200 mg by mouth daily., Disp: , Rfl:  .  traMADol (ULTRAM) 50 MG tablet, Take 1-2 tablets (50-100 mg total) by mouth every 6 (six) hours as needed for moderate pain., Disp: 60 tablet, Rfl: 0  EXAM:  VITALS  per patient if applicable:  GENERAL: alert, oriented, appears well and in no acute distress  HEENT: atraumatic, conjunttiva clear, no obvious abnormalities on inspection of external nose and ears  NECK: normal movements of the head and neck  LUNGS: on inspection no signs of respiratory distress, breathing rate appears normal, no obvious gross SOB, gasping or wheezing  CV: no obvious cyanosis  MS: moves all visible extremities without noticeable abnormality  PSYCH/NEURO: pleasant and cooperative, no obvious depression or anxiety, speech and thought processing grossly intact  ASSESSMENT AND PLAN: Recurrent sinusitis, treat with 14 days of Doxycycline.  Victoria Penna, MD  Discussed the following assessment and plan:  No diagnosis found.     I discussed the assessment and treatment plan with the patient. The patient was provided an opportunity to ask questions and all were answered. The patient agreed with the plan and demonstrated an understanding of the instructions.   The patient was advised to call back or seek an in-person evaluation if the symptoms worsen or if the condition fails to improve as anticipated.

## 2019-10-28 NOTE — Addendum Note (Signed)
Addended by: Alysia Penna A on: 10/28/2019 04:29 PM   Modules accepted: Orders

## 2019-10-28 NOTE — Telephone Encounter (Signed)
The referral was done  

## 2019-10-28 NOTE — Telephone Encounter (Signed)
Copied from [place] [id number]. Topic: Referral - Request for Referral >> Oct 28, 2019 12:40 PM Erick Blinks wrote: Has patient seen PCP for this complaint? Yes *If NO, is insurance requiring patient see PCP for this issue before PCP can refer them? Referral for which specialty: Infectious disease Preferred provider/office: Jeri Lager, [facility] Fax: [phone] Reason for referral: Per today's appt

## 2019-10-28 NOTE — Telephone Encounter (Signed)
Please advise if referral is okay. I did not see any mention of this in her note from today

## 2019-10-28 NOTE — Telephone Encounter (Signed)
Pt has been scheduled.  °

## 2019-10-29 ENCOUNTER — Telehealth: Payer: Self-pay | Admitting: Family Medicine

## 2019-10-29 MED ORDER — FLUCONAZOLE 150 MG PO TABS: 150.0000 mg | ORAL_TABLET | Freq: Once | ORAL | 5 refills | Status: AC

## 2019-10-29 NOTE — Telephone Encounter (Signed)
Spoke with patient and informed her that medication was sent to pharmacy in [place].

## 2019-10-29 NOTE — Telephone Encounter (Signed)
Copied from [place] [id number]. Topic: Quick Communication - Rx Refill/Question >> Oct 29, 2019  9:29 AM Rainey Pines A wrote: Medication: Vaginal Cream (Patient needs medication sent to pharmacy, medication was left out from visit yesterday.)  Has the patient contacted their pharmacy? Yes (Agent: If no, request that the patient contact the pharmacy for the refill.) (Agent: If yes, when and what did the pharmacy advise?)Contact PCP  Preferred Pharmacy (with phone number or street name): [place], [place]  Phone:  [phone] Fax:  [phone]     Agent: Please be advised that RX refills may take up to 3 business days. We ask that you follow-up with your pharmacy.

## 2019-10-29 NOTE — Telephone Encounter (Signed)
This was Diflucan, and I sent this to the [facility] in [place]

## 2019-10-29 NOTE — Addendum Note (Signed)
Addended by: Alysia Penna A on: 10/29/2019 04:24 PM   Modules accepted: Orders

## 2019-10-29 NOTE — Telephone Encounter (Signed)
Please advise 

## 2019-10-29 NOTE — Telephone Encounter (Signed)
We did not discuss a vaginal cream. What does she need?

## 2019-10-29 NOTE — Telephone Encounter (Signed)
Patient stated that she was prescribed antibiotic yesterday and wanted to know if she could get medication to prevent her from getting a yeast infection. Patient stated that she did not ask for a cream, she said it was a pill and that her pcp would know exactly what she is talking about.

## 2019-11-12 ENCOUNTER — Other Ambulatory Visit: Payer: Self-pay

## 2019-11-12 ENCOUNTER — Ambulatory Visit (INDEPENDENT_AMBULATORY_CARE_PROVIDER_SITE_OTHER): Payer: PPO | Admitting: Internal Medicine

## 2019-11-12 VITALS — BP 122/74 | HR 73 | Wt 198.8 lb

## 2019-11-12 DIAGNOSIS — K0889 Other specified disorders of teeth and supporting structures: Secondary | ICD-10-CM | POA: Diagnosis not present

## 2019-11-12 DIAGNOSIS — J320 Chronic maxillary sinusitis: Secondary | ICD-10-CM

## 2019-11-12 NOTE — Progress Notes (Signed)
RFV: recurrent sinusitis  Patient ID: Victoria Mosley, female   DOB: 05/17/1951, 69 y.o.   MRN: [id number]  HPI Victoria Mosley is a 69 y.o. female with pmhx of chronic sinus infections for the last 3-4 years that is refractor to several different antibiotics and surgery.in 2017- underwent several nasal endoscopy cleanings - irritation to the left septum where she has frequent nosebleeds as well as perforation but sinuses have always been open at the time. Previous cx from this material has shown MSSA. (S-clindamycin, bactrim). Failed 6w clindamycin therapy and I had her return to Dr. Janace Hoard to recollect sample after post-tx CT showed opacification and thickening of the left maxillary sinus. Heavy pseudomonas growth noted (sensitive).  Just finished 14 d course of doxycycline but not having any improvement  Pain in size, sensitivity maxillary area  Ros - teeth sensitivity. 12 point ros is otherwise negative   Outpatient Encounter Medications as of 11/12/2019  Medication Sig  . AMOXICILLIN PO Take 500 mg by mouth. Takes 2000mg  prior to dental procedures only  . Ascorbic Acid (VITAMIN C) 1000 MG tablet Take 1,000 mg by mouth daily.  Marland Kitchen b complex vitamins tablet Take 1 tablet by mouth daily.  Marland Kitchen CALCIUM CITRATE PO Take 1,000 mg by mouth every evening.  . Cranberry 250 MG CAPS Take 500 mg by mouth 2 (two) times daily.  . diphenhydrAMINE (BENADRYL) 25 mg capsule Take 25 mg by mouth at bedtime.  Marland Kitchen DOCUSATE SODIUM PO Take 100 mg by mouth at bedtime.  Marland Kitchen estradiol (ESTRACE) 0.5 MG tablet Take 1 tablet (0.5 mg total) by mouth daily.  Marland Kitchen ezetimibe (ZETIA) 10 MG tablet Take 1 tablet (10 mg total) by mouth daily.  Marland Kitchen FIORICET 50-300-40 MG CAPS TAKE ONE CAPSULE TWICE A DAY AS NEEDED FOR HEADACHE  . hydrochlorothiazide (HYDRODIURIL) 25 MG tablet Take 1 tablet (25 mg total) by mouth daily.  Marland Kitchen LACTOBACILLUS PO Take 1 tablet by mouth daily.  Marland Kitchen levothyroxine (SYNTHROID) 75 MCG tablet Take 1 tablet by  mouth once daily  . loratadine (CLARITIN) 10 MG tablet Take 10 mg by mouth daily.  . Multiple Vitamins-Minerals (WOMENS 50+ MULTI VITAMIN/MIN PO) Take 1 tablet by mouth daily.  Marland Kitchen omeprazole (PRILOSEC) 20 MG capsule Take 1 capsule (20 mg total) by mouth daily.  . predniSONE (DELTASONE) 5 MG tablet Take 1 tablet (5 mg total) by mouth daily with breakfast.  . prochlorperazine (COMPAZINE) 10 MG tablet Take 1 tablet (10 mg total) by mouth every 6 (six) hours as needed for nausea or vomiting.  . progesterone (PROMETRIUM) 100 MG capsule Take 1 capsule (100 mg total) by mouth at bedtime.  . propranolol (INDERAL) 40 MG tablet Take 1 tablet (40 mg total) by mouth daily.  Marland Kitchen pyridoxine (B-6) 200 MG tablet Take 200 mg by mouth daily.  . traMADol (ULTRAM) 50 MG tablet Take 1-2 tablets (50-100 mg total) by mouth every 6 (six) hours as needed for moderate pain.  . Amoxicillin (MOXATAG PO) amoxicillin  2000mg   . methenamine (HIPREX) 1 g tablet Take 1 g by mouth 2 (two) times daily with a meal.  . naproxen sodium (ANAPROX) 220 MG tablet Take 220 mg by mouth daily.   . potassium gluconate 595 MG TABS Take 1,190 mg by mouth every evening.    No facility-administered encounter medications on file as of 11/12/2019.     Patient Active Problem List   Diagnosis Date Noted  . Left ankle pain 01/02/2018  . Chronic maxillary sinusitis 09/28/2017  .  OA (osteoarthritis) of hip 09/27/2016  . Dizziness 04/17/2016  . S/P total knee arthroplasty 12/19/2014  . Intractable back pain 12/19/2014  . Sciatica of left side 12/19/2014  . Migraines 12/19/2014  . DDD (degenerative disc disease), lumbar 12/19/2014  . Left-sided low back pain with left-sided sciatica   . Status post total right knee replacement   . OA (osteoarthritis) of knee 12/07/2014  . 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 Migraine with aura, intractable 08/06/2012  . Overactive  bladder 07/31/2012  . Epistaxis 07/12/2012  . IBS (irritable bowel syndrome) 07/26/2011  . OSTEOARTHRITIS 01/26/2010  . NEPHROLITHIASIS, HX OF 01/26/2010  . DIVERTICULITIS, COLON 11/02/2008  . Hyperlipidemia 02/25/2008  . Headache 10/17/2007  . ALLERGIC RHINITIS 06/19/2007  . GERD 06/19/2007   family history includes Alzheimer's disease in her sister; Bladder Cancer in her sister; Diabetes in her mother; Heart disease in her mother and sister; Hypertension in her father, mother, and sister; Leukemia in her father; Stomach cancer in her paternal grandmother.   Social History   Tobacco Use  . Smoking status: Former Smoker    Packs/day: 0.25    Quit date: 12/20/1998    Years since quitting: 20.9  . Smokeless tobacco: Never Used  Substance Use Topics  . Alcohol use: No    Alcohol/week: 0.0 standard drinks  . Drug use: No   Review of System Constitutional: Negative for fever, chills, diaphoresis, activity change, appetite change, fatigue and unexpected weight change.  HENT: Negative for congestion, sore throat, rhinorrhea, sneezing, trouble swallowing and sinus pressure.  Eyes: Negative for photophobia and visual disturbance.  Respiratory: Negative for cough, chest tightness, shortness of breath, wheezing and stridor.  Cardiovascular: Negative for chest pain, palpitations and leg swelling.  Gastrointestinal: Negative for nausea, vomiting, abdominal pain, diarrhea, constipation, blood in stool, abdominal distention and anal bleeding.  Genitourinary: Negative for dysuria, hematuria, flank pain and difficulty urinating.  Musculoskeletal: Negative for myalgias, back pain, joint swelling, arthralgias and gait problem.  Skin: Negative for color change, pallor, rash and wound.  Neurological: Negative for dizziness, tremors, weakness and light-headedness.  Hematological: Negative for adenopathy. Does not bruise/bleed easily.  Psychiatric/Behavioral: Negative for behavioral problems,  confusion, sleep disturbance, dysphoric mood, decreased concentration and agitation.   Physical Exam   BP 122/74   Pulse 73   Wt 198 lb 12.8 oz (90.2 kg)   BMI 30.23 kg/m   Physical Exam  Constitutional:  oriented to person, place, and time. appears well-developed and well-nourished. No distress.  HENT: [place]/AT, PERRLA, no scleral icterus Mouth/Throat: Oropharynx is clear and moist. No oropharyngeal exudate.  Cardiovascular: Normal rate, regular rhythm and normal heart sounds. Exam reveals no gallop and no friction rub.  No murmur heard.  Pulmonary/Chest: Effort normal and breath sounds normal. No respiratory distress.  has no wheezes.  Neck = supple, no nuchal rigidity Abdominal: Soft. Bowel sounds are normal.  exhibits no distension. There is no tenderness.  Lymphadenopathy: no cervical adenopathy. No axillary adenopathy Neurological: alert and oriented to person, place, and time.  Skin: Skin is warm and dry. No rash noted. No erythema.  Psychiatric: a normal mood and affect.  behavior is normal.   CBC Lab Results  Component Value Date   WBC 9.5 11/18/2018   RBC 4.98 11/18/2018   HGB 15.8 (H) 11/18/2018   HCT 45.8 11/18/2018   PLT 238.0 11/18/2018   MCV 91.9 11/18/2018   MCH 22.7 (L) 02/13/2017  MCHC 34.5 11/18/2018   RDW 13.8 11/18/2018   LYMPHSABS 2.0 11/18/2018   MONOABS 1.2 (H) 11/18/2018   EOSABS 0.3 11/18/2018    BMET Lab Results  Component Value Date   NA 142 11/18/2018   K 4.0 11/18/2018   CL 100 11/18/2018   CO2 32 11/18/2018   GLUCOSE 121 (H) 11/18/2018   BUN 18 11/18/2018   CREATININE 0.75 11/18/2018   CALCIUM 9.9 11/18/2018   GFRNONAA >60 02/13/2017   GFRAA >60 02/13/2017      Assessment and Plan Recurrent sinusitis = Need sinus cx aerobic from dr Jacinto Reap. Suspect still ongoing pseudomonas infection but would like confirmation with culture results to guide treatment Then set up with iv abtx plan for picc line IV with cefepime..   Dental pain =  likely referred pain from sinuses. Can continue with taking tylenol or ibuprofen if needed  Spent 20 in coordination of services for recurrent sinusitis

## 2019-11-17 ENCOUNTER — Other Ambulatory Visit: Payer: Self-pay | Admitting: Family Medicine

## 2019-11-19 DIAGNOSIS — J3489 Other specified disorders of nose and nasal sinuses: Secondary | ICD-10-CM | POA: Diagnosis not present

## 2019-11-19 DIAGNOSIS — J343 Hypertrophy of nasal turbinates: Secondary | ICD-10-CM | POA: Diagnosis not present

## 2019-11-19 DIAGNOSIS — Z7289 Other problems related to lifestyle: Secondary | ICD-10-CM | POA: Diagnosis not present

## 2019-11-19 DIAGNOSIS — J320 Chronic maxillary sinusitis: Secondary | ICD-10-CM | POA: Diagnosis not present

## 2019-11-20 DIAGNOSIS — M79672 Pain in left foot: Secondary | ICD-10-CM | POA: Diagnosis not present

## 2019-11-20 DIAGNOSIS — M67962 Unspecified disorder of synovium and tendon, left lower leg: Secondary | ICD-10-CM | POA: Diagnosis not present

## 2019-11-20 DIAGNOSIS — Q6689 Other  specified congenital deformities of feet: Secondary | ICD-10-CM | POA: Diagnosis not present

## 2019-11-24 ENCOUNTER — Ambulatory Visit: Payer: PPO | Attending: Internal Medicine

## 2019-11-24 ENCOUNTER — Ambulatory Visit (INDEPENDENT_AMBULATORY_CARE_PROVIDER_SITE_OTHER): Payer: PPO | Admitting: Family Medicine

## 2019-11-24 ENCOUNTER — Other Ambulatory Visit: Payer: Self-pay

## 2019-11-24 ENCOUNTER — Encounter: Payer: Self-pay | Admitting: Family Medicine

## 2019-11-24 VITALS — BP 120/62 | HR 71 | Temp 98.2°F | Wt 200.4 lb

## 2019-11-24 DIAGNOSIS — Z0000 Encounter for general adult medical examination without abnormal findings: Secondary | ICD-10-CM | POA: Diagnosis not present

## 2019-11-24 DIAGNOSIS — R739 Hyperglycemia, unspecified: Secondary | ICD-10-CM

## 2019-11-24 DIAGNOSIS — E039 Hypothyroidism, unspecified: Secondary | ICD-10-CM | POA: Insufficient documentation

## 2019-11-24 DIAGNOSIS — Z23 Encounter for immunization: Secondary | ICD-10-CM

## 2019-11-24 MED ORDER — OMEPRAZOLE 20 MG PO CPDR: 20.0000 mg | DELAYED_RELEASE_CAPSULE | Freq: Every day | ORAL | 3 refills | Status: DC

## 2019-11-24 MED ORDER — PROPRANOLOL HCL 40 MG PO TABS: 40.0000 mg | ORAL_TABLET | Freq: Every day | ORAL | 3 refills | Status: DC

## 2019-11-24 MED ORDER — HYDROCHLOROTHIAZIDE 25 MG PO TABS: 25.0000 mg | ORAL_TABLET | Freq: Every day | ORAL | 3 refills | Status: DC

## 2019-11-24 MED ORDER — EZETIMIBE 10 MG PO TABS: 10.0000 mg | ORAL_TABLET | Freq: Every day | ORAL | 3 refills | Status: DC

## 2019-11-24 MED ORDER — PROCHLORPERAZINE MALEATE 10 MG PO TABS: 10.0000 mg | ORAL_TABLET | Freq: Four times a day (QID) | ORAL | 5 refills | Status: DC | PRN

## 2019-11-24 MED ORDER — LEVOTHYROXINE SODIUM 75 MCG PO TABS: 75.0000 ug | ORAL_TABLET | Freq: Every day | ORAL | 3 refills | Status: DC

## 2019-11-24 MED ORDER — TRAMADOL HCL 50 MG PO TABS: 100.0000 mg | ORAL_TABLET | Freq: Four times a day (QID) | ORAL | 5 refills | Status: DC | PRN

## 2019-11-24 MED ORDER — FIORICET 50-300-40 MG PO CAPS: ORAL_CAPSULE | ORAL | 5 refills | Status: DC

## 2019-11-24 NOTE — Progress Notes (Signed)
   Covid-19 Vaccination Clinic  Name:  Victoria Mosley    MRN: [id number] DOB: 26-May-1951  11/24/2019  [name] was observed post Covid-19 immunization for 30 minutes based on pre-vaccination screening without incidence. She was provided with Vaccine Information Sheet and instruction to access the V-Safe system.   [name] was instructed to call 911 with any severe reactions post vaccine: Marland Kitchen Difficulty breathing  . Swelling of your face and throat  . A fast heartbeat  . A bad rash all over your body  . Dizziness and weakness    Immunizations Administered    Name Date Dose VIS Date Route   Pfizer COVID-19 Vaccine 11/24/2019  3:11 PM 0.3 mL 10/10/2019 Intramuscular   Manufacturer: [place]   Lot: [id number]   [place]: [id number]

## 2019-11-24 NOTE — Progress Notes (Signed)
Subjective:    Patient ID: Victoria Mosley, female    DOB: 1950-11-09, 69 y.o.   MRN: [id number]  HPI Here for a well exam. She is doing well in general, but she still deals with chronic left maxillary sinusitis. She recently saw Dr. Karolee Ohs about the possibility of long term IV antibiotics through a PICC line, and he asked Dr. Melida Quitter to obtain a sinus culture. He did so, and although I do not have access to the final report, Pam tells me it grew something other than the Pseudomonas they had expected. Now Dr. Redmond Baseman' plan is to use sterile saline nasal lavage to flush the sinus out and to use occasional antibiotic lavage to keep the infection under control.    Review of Systems  Constitutional: Negative.   HENT: Positive for postnasal drip, sinus pressure and sinus pain.   Eyes: Negative.   Respiratory: Negative.   Cardiovascular: Negative.   Gastrointestinal: Negative.   Genitourinary: Negative for decreased urine volume, difficulty urinating, dyspareunia, dysuria, enuresis, flank pain, frequency, hematuria, pelvic pain and urgency.  Musculoskeletal: Negative.   Skin: Negative.   Neurological: Negative.   Psychiatric/Behavioral: Negative.        Objective:   Physical Exam Constitutional:      General: She is not in acute distress.    Appearance: She is well-developed.  HENT:     Head: Normocephalic and atraumatic.     Right Ear: External ear normal.     Left Ear: External ear normal.     Nose: Nose normal.     Mouth/Throat:     Pharynx: No oropharyngeal exudate.  Eyes:     General: No scleral icterus.    Conjunctiva/sclera: Conjunctivae normal.     Pupils: Pupils are equal, round, and reactive to light.  Neck:     Thyroid: No thyromegaly.     Vascular: No JVD.  Cardiovascular:     Rate and Rhythm: Normal rate and regular rhythm.     Heart sounds: Normal heart sounds. No murmur. No friction rub. No gallop.   Pulmonary:     Effort: Pulmonary effort is  normal. No respiratory distress.     Breath sounds: Normal breath sounds. No wheezing or rales.  Chest:     Chest wall: No tenderness.  Abdominal:     General: Bowel sounds are normal. There is no distension.     Palpations: Abdomen is soft. There is no mass.     Tenderness: There is no abdominal tenderness. There is no guarding or rebound.  Musculoskeletal:        General: No tenderness. Normal range of motion.     Cervical back: Normal range of motion and neck supple.  Lymphadenopathy:     Cervical: No cervical adenopathy.  Skin:    General: Skin is warm and dry.     Findings: No erythema or rash.  Neurological:     Mental Status: She is alert and oriented to person, place, and time.     Cranial Nerves: No cranial nerve deficit.     Motor: No abnormal muscle tone.     Coordination: Coordination normal.     Deep Tendon Reflexes: Reflexes are normal and symmetric. Reflexes normal.  Psychiatric:        Behavior: Behavior normal.        Thought Content: Thought content normal.        Judgment: Judgment normal.           Assessment &  Plan:  Well exam. We discussed diet and exercise. Get fasting labs soon.  Alysia Penna, MD

## 2019-11-28 ENCOUNTER — Other Ambulatory Visit: Payer: Self-pay

## 2019-11-28 ENCOUNTER — Other Ambulatory Visit (INDEPENDENT_AMBULATORY_CARE_PROVIDER_SITE_OTHER): Payer: PPO

## 2019-11-28 DIAGNOSIS — R739 Hyperglycemia, unspecified: Secondary | ICD-10-CM

## 2019-11-28 DIAGNOSIS — Z0000 Encounter for general adult medical examination without abnormal findings: Secondary | ICD-10-CM | POA: Diagnosis not present

## 2019-11-28 DIAGNOSIS — E039 Hypothyroidism, unspecified: Secondary | ICD-10-CM | POA: Diagnosis not present

## 2019-11-28 LAB — HEPATIC FUNCTION PANEL
ALT: 28 U/L (ref 0–35)
AST: 24 U/L (ref 0–37)
Albumin: 4.1 g/dL (ref 3.5–5.2)
Alkaline Phosphatase: 53 U/L (ref 39–117)
Bilirubin, Direct: 0.1 mg/dL (ref 0.0–0.3)
Total Bilirubin: 0.4 mg/dL (ref 0.2–1.2)
Total Protein: 6.7 g/dL (ref 6.0–8.3)

## 2019-11-28 LAB — CBC WITH DIFFERENTIAL/PLATELET
Basophils Absolute: 0 10*3/uL (ref 0.0–0.1)
Basophils Relative: 0.4 % (ref 0.0–3.0)
Eosinophils Absolute: 0.1 10*3/uL (ref 0.0–0.7)
Eosinophils Relative: 1.7 % (ref 0.0–5.0)
HCT: 44.7 % (ref 36.0–46.0)
Hemoglobin: 15.4 g/dL — ABNORMAL HIGH (ref 12.0–15.0)
Lymphocytes Relative: 32 % (ref 12.0–46.0)
Lymphs Abs: 2.2 10*3/uL (ref 0.7–4.0)
MCHC: 34.5 g/dL (ref 30.0–36.0)
MCV: 91.7 fl (ref 78.0–100.0)
Monocytes Absolute: 0.8 10*3/uL (ref 0.1–1.0)
Monocytes Relative: 12.5 % — ABNORMAL HIGH (ref 3.0–12.0)
Neutro Abs: 3.6 10*3/uL (ref 1.4–7.7)
Neutrophils Relative %: 53.4 % (ref 43.0–77.0)
Platelets: 197 10*3/uL (ref 150.0–400.0)
RBC: 4.88 Mil/uL (ref 3.87–5.11)
RDW: 13.8 % (ref 11.5–15.5)
WBC: 6.7 10*3/uL (ref 4.0–10.5)

## 2019-11-28 LAB — TSH: TSH: 5.16 u[IU]/mL — ABNORMAL HIGH (ref 0.35–4.50)

## 2019-11-28 LAB — BASIC METABOLIC PANEL
BUN: 18 mg/dL (ref 6–23)
CO2: 32 mEq/L (ref 19–32)
Calcium: 9.5 mg/dL (ref 8.4–10.5)
Chloride: 99 mEq/L (ref 96–112)
Creatinine, Ser: 0.8 mg/dL (ref 0.40–1.20)
GFR: 71.42 mL/min (ref >60.00)
Glucose, Bld: 137 mg/dL — ABNORMAL HIGH (ref 70–99)
Potassium: 4.1 mEq/L (ref 3.5–5.1)
Sodium: 139 mEq/L (ref 135–145)

## 2019-11-28 LAB — T3, FREE: T3, Free: 3.8 pg/mL (ref 2.3–4.2)

## 2019-11-28 LAB — LIPID PANEL
Cholesterol: 193 mg/dL (ref 0–200)
HDL: 41.3 mg/dL (ref >39.00)
NonHDL: 151.51
Total CHOL/HDL Ratio: 5
Triglycerides: 279 mg/dL — ABNORMAL HIGH (ref 0.0–149.0)
VLDL: 55.8 mg/dL — ABNORMAL HIGH (ref 0.0–40.0)

## 2019-11-28 LAB — HEMOGLOBIN A1C: Hgb A1c MFr Bld: 6.5 % (ref 4.6–6.5)

## 2019-11-28 LAB — LDL CHOLESTEROL, DIRECT: Direct LDL: 122 mg/dL

## 2019-11-28 LAB — T4, FREE: Free T4: 0.83 ng/dL (ref 0.60–1.60)

## 2019-12-04 DIAGNOSIS — H5203 Hypermetropia, bilateral: Secondary | ICD-10-CM | POA: Diagnosis not present

## 2019-12-04 DIAGNOSIS — D229 Melanocytic nevi, unspecified: Secondary | ICD-10-CM | POA: Diagnosis not present

## 2019-12-04 DIAGNOSIS — Z85820 Personal history of malignant melanoma of skin: Secondary | ICD-10-CM | POA: Diagnosis not present

## 2019-12-04 DIAGNOSIS — L819 Disorder of pigmentation, unspecified: Secondary | ICD-10-CM | POA: Diagnosis not present

## 2019-12-04 DIAGNOSIS — D1801 Hemangioma of skin and subcutaneous tissue: Secondary | ICD-10-CM | POA: Diagnosis not present

## 2019-12-04 DIAGNOSIS — L814 Other melanin hyperpigmentation: Secondary | ICD-10-CM | POA: Diagnosis not present

## 2019-12-04 DIAGNOSIS — H2513 Age-related nuclear cataract, bilateral: Secondary | ICD-10-CM | POA: Diagnosis not present

## 2019-12-04 DIAGNOSIS — L905 Scar conditions and fibrosis of skin: Secondary | ICD-10-CM | POA: Diagnosis not present

## 2019-12-04 DIAGNOSIS — D485 Neoplasm of uncertain behavior of skin: Secondary | ICD-10-CM | POA: Diagnosis not present

## 2019-12-04 DIAGNOSIS — L821 Other seborrheic keratosis: Secondary | ICD-10-CM | POA: Diagnosis not present

## 2019-12-04 DIAGNOSIS — L570 Actinic keratosis: Secondary | ICD-10-CM | POA: Diagnosis not present

## 2019-12-05 DIAGNOSIS — Z96642 Presence of left artificial hip joint: Secondary | ICD-10-CM | POA: Diagnosis not present

## 2019-12-05 DIAGNOSIS — M25562 Pain in left knee: Secondary | ICD-10-CM | POA: Diagnosis not present

## 2019-12-05 IMAGING — CT CT MAXILLOFACIAL W/O CM
3 series · 16 of 47 positions shown, 19 images · non-contrast
Comparison: CT scan of April 20, 2016.

CLINICAL DATA: Chronic maxillary sinusitis.

EXAM:
CT MAXILLOFACIAL WITHOUT CONTRAST
TECHNIQUE: Multidetector CT imaging of the maxillofacial structures was
performed. Multiplanar CT image reconstructions were also generated.

[Series 2: max soft · axial · 0.33mm/px · z∈[-206,-62]mm · 10 of 84 slices shown, 13 images]
[im 6/84  brain]
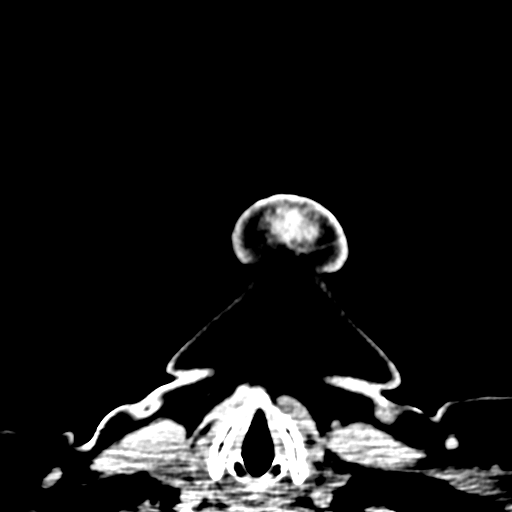
[im 6/84  bone]
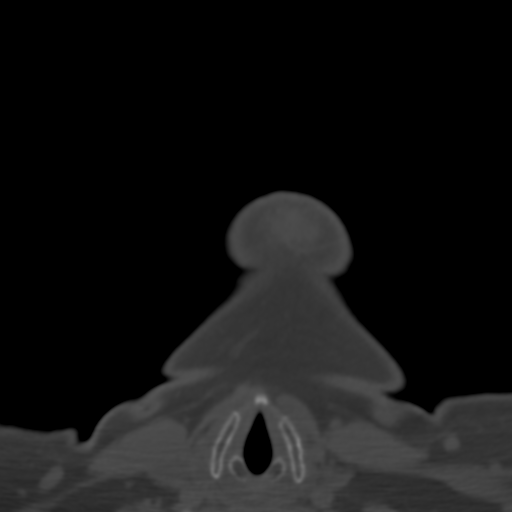
[im 15/84  bone]
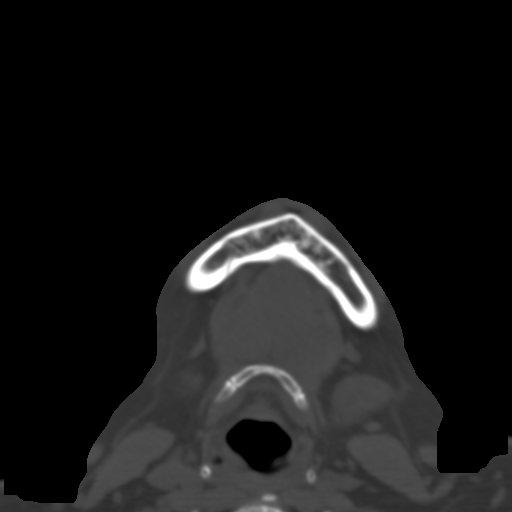
[im 23/84  bone]
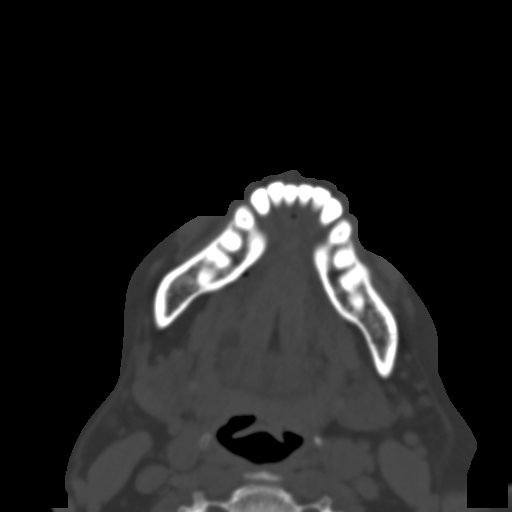
[im 29/84  bone]
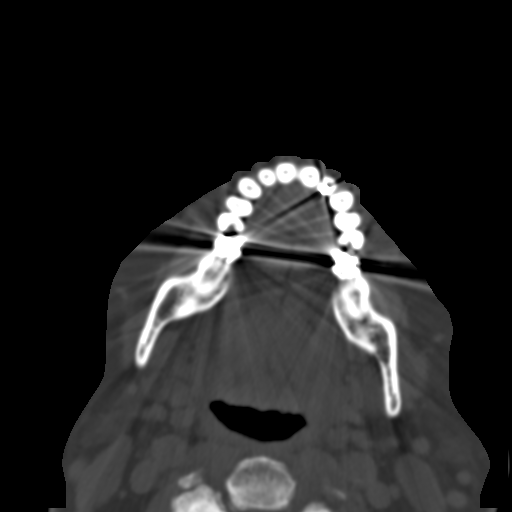
[im 38/84  brain]
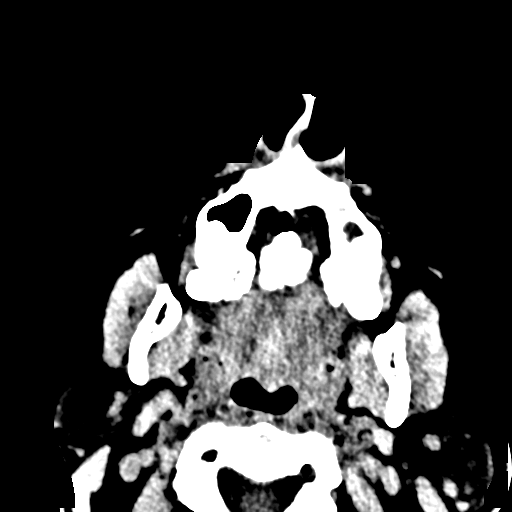
[im 38/84  bone]
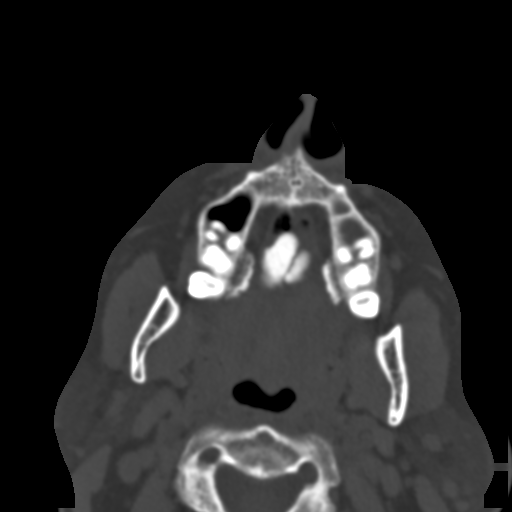
[im 46/84  bone]
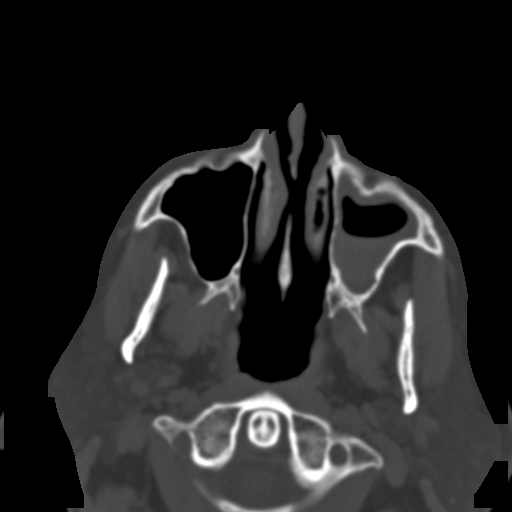
[im 55/84  bone]
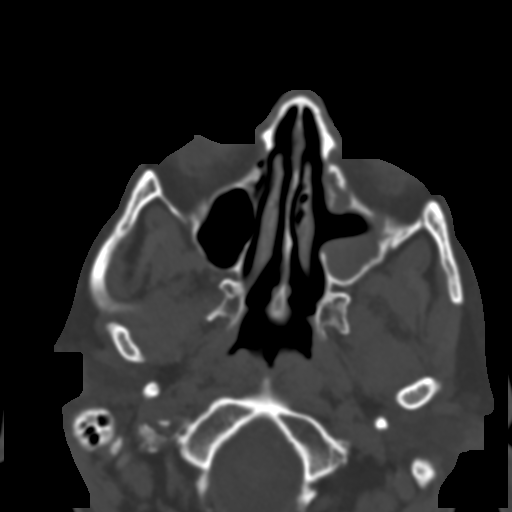
[im 63/84  bone]
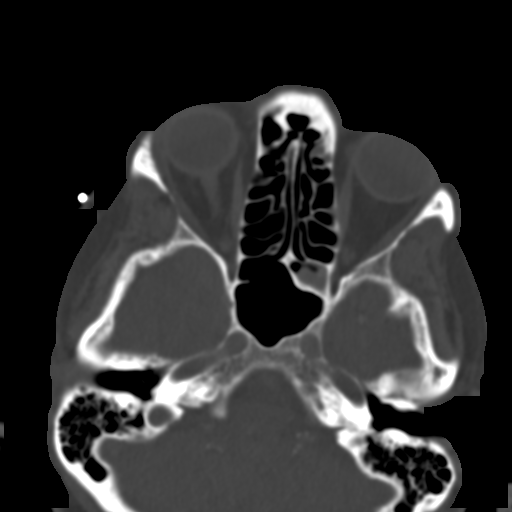
[im 69/84  brain]
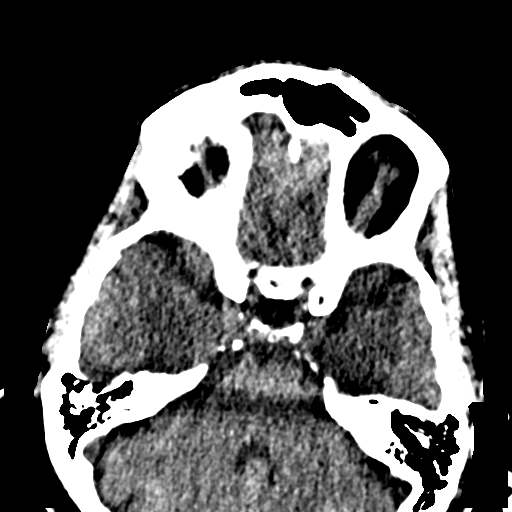
[im 69/84  bone]
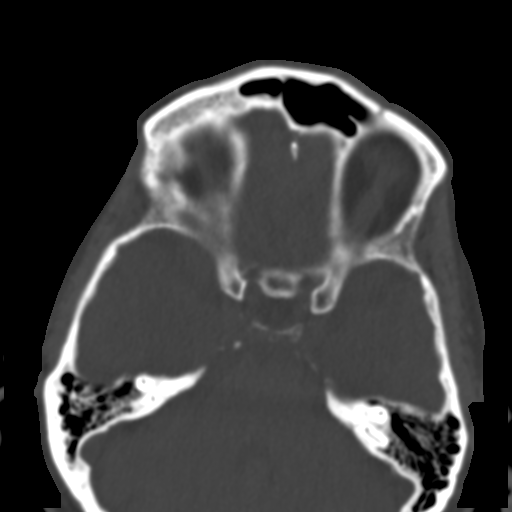
[im 78/84  bone]
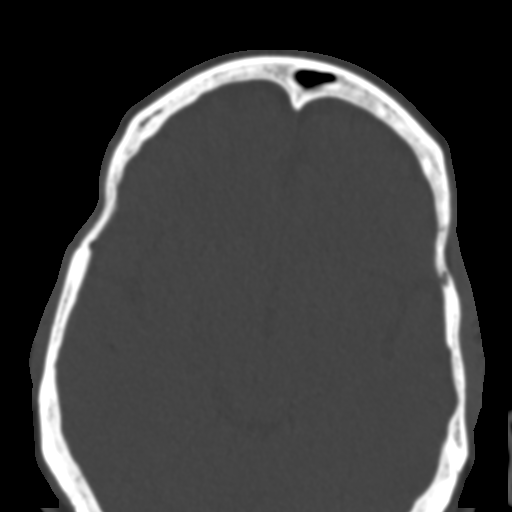

[Series 6: coronal soft · coronal · 0.34mm/px · 3 of 68 slices shown]
[im 23/68  bone]
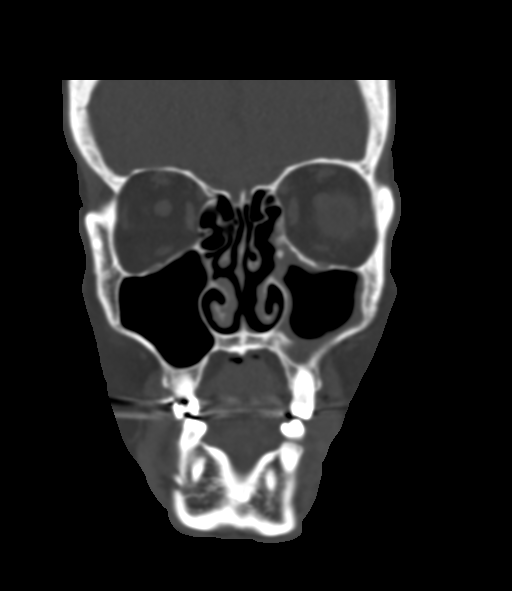
[im 30/68  bone]
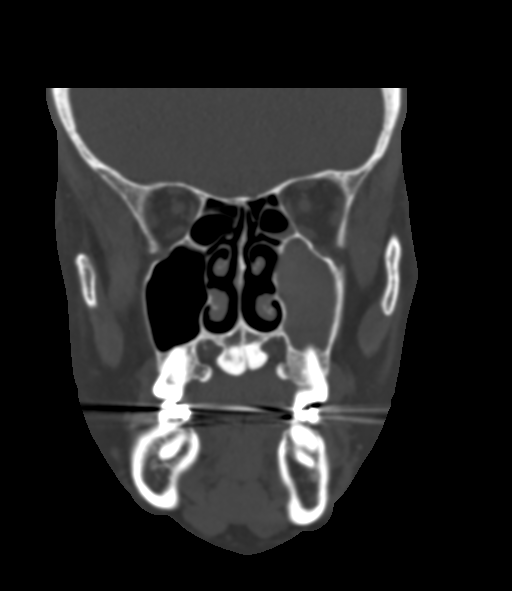
[im 38/68  bone]
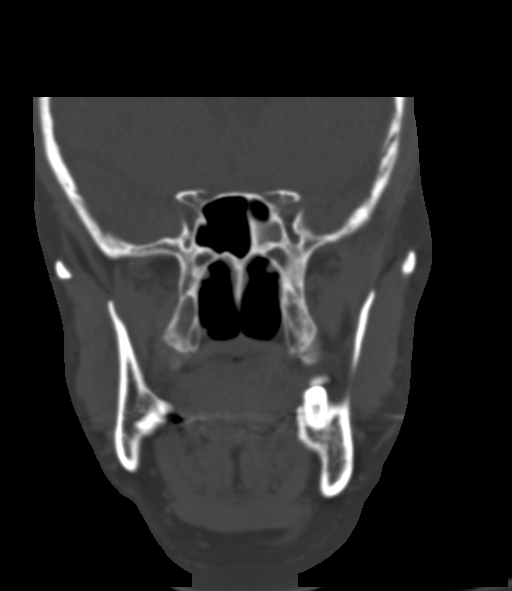

[Series 7: sagittal soft · sagittal · 0.31mm/px · 3 of 78 slices shown]
[im 26/78  bone]
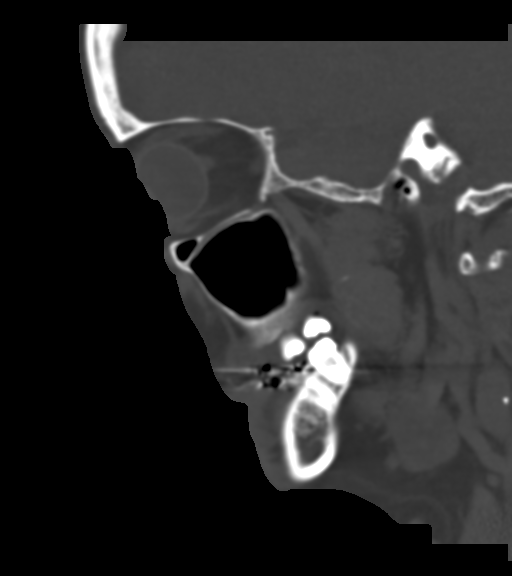
[im 39/78  bone]
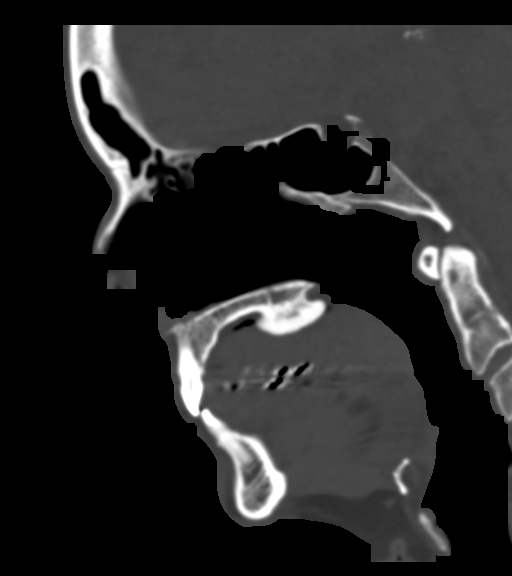
[im 52/78  bone]
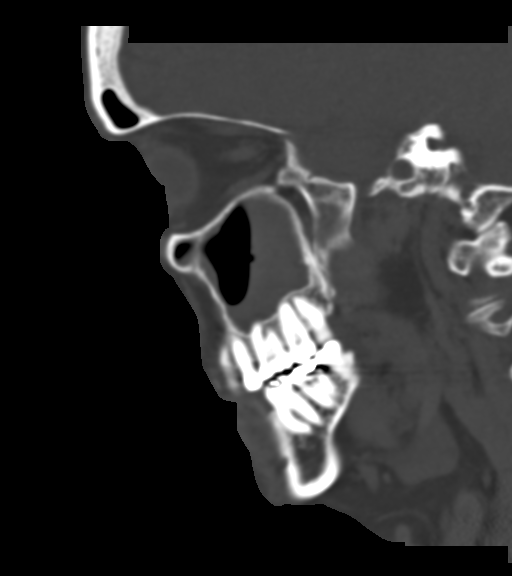

[16 of 47 positions shown; findings below may reference images not displayed]

FINDINGS: Osseous: No fracture or mandibular dislocation. No destructive
process.

Orbits: Negative. No traumatic or inflammatory finding.

Sinuses: Left maxillary sinusitis is noted. Status post interval
left maxillary antrostomy. Mild left ethmoid sinusitis is noted.

Soft tissues: Negative.

Limited intracranial: No significant or unexpected finding.
IMPRESSION: Mild left ethmoid sinusitis is noted. Mucosal thickening and fluid
level are noted in left maxillary sinus consistent with sinusitis.
Patient is status post left maxillary antrostomy since prior exam.

## 2019-12-15 ENCOUNTER — Ambulatory Visit: Payer: PPO | Attending: Internal Medicine

## 2019-12-15 DIAGNOSIS — Z23 Encounter for immunization: Secondary | ICD-10-CM

## 2019-12-15 NOTE — Progress Notes (Signed)
   Covid-19 Vaccination Clinic  Name:  Victoria Mosley    MRN: [id number] DOB: February 22, 1951  12/15/2019  Victoria Mosley was observed post Covid-19 immunization for 30 minutes based on pre-vaccination screening without incidence. She was provided with Vaccine Information Sheet and instruction to access the V-Safe system.   Victoria Mosley was instructed to call 911 with any severe reactions post vaccine: Marland Kitchen Difficulty breathing  . Swelling of your face and throat  . A fast heartbeat  . A bad rash all over your body  . Dizziness and weakness    Immunizations Administered    Name Date Dose VIS Date Route   Pfizer COVID-19 Vaccine 12/15/2019  8:49 AM 0.3 mL 10/10/2019 Intramuscular   Manufacturer: [place]   Lot: [id number]   [place]: [id number]

## 2019-12-17 ENCOUNTER — Ambulatory Visit: Payer: PPO | Admitting: Internal Medicine

## 2020-01-02 DIAGNOSIS — M25512 Pain in left shoulder: Secondary | ICD-10-CM | POA: Diagnosis not present

## 2020-01-02 DIAGNOSIS — M7542 Impingement syndrome of left shoulder: Secondary | ICD-10-CM | POA: Diagnosis not present

## 2020-01-06 DIAGNOSIS — M7542 Impingement syndrome of left shoulder: Secondary | ICD-10-CM | POA: Insufficient documentation

## 2020-01-19 DIAGNOSIS — M19079 Primary osteoarthritis, unspecified ankle and foot: Secondary | ICD-10-CM | POA: Diagnosis not present

## 2020-01-19 DIAGNOSIS — N200 Calculus of kidney: Secondary | ICD-10-CM | POA: Diagnosis not present

## 2020-01-19 DIAGNOSIS — M76822 Posterior tibial tendinitis, left leg: Secondary | ICD-10-CM | POA: Diagnosis not present

## 2020-01-19 DIAGNOSIS — N319 Neuromuscular dysfunction of bladder, unspecified: Secondary | ICD-10-CM | POA: Diagnosis not present

## 2020-01-19 DIAGNOSIS — N3020 Other chronic cystitis without hematuria: Secondary | ICD-10-CM | POA: Diagnosis not present

## 2020-02-05 ENCOUNTER — Telehealth: Payer: Self-pay | Admitting: Family Medicine

## 2020-02-05 DIAGNOSIS — E039 Hypothyroidism, unspecified: Secondary | ICD-10-CM

## 2020-02-05 DIAGNOSIS — G43001 Migraine without aura, not intractable, with status migrainosus: Secondary | ICD-10-CM

## 2020-02-05 NOTE — Progress Notes (Signed)
  Chronic Care Management   Note  02/05/2020 Name: Victoria Mosley MRN: [id number] DOB: 1951-05-26  Victoria Mosley is a 69 y.o. year old female who is a primary care patient of Laurey Morale, MD. I reached out to Victoria Mosley by phone today in response to a referral sent by Victoria Mosley PCP, Laurey Morale, MD.   Victoria Mosley was given information about Chronic Care Management services today including:  1. CCM service includes personalized support from designated clinical staff supervised by her physician, including individualized plan of care and coordination with other care providers 2. 24/7 contact phone numbers for assistance for urgent and routine care needs. 3. Service will only be billed when office clinical staff spend 20 minutes or more in a month to coordinate care. 4. Only one practitioner may furnish and bill the service in a calendar month. 5. The patient may stop CCM services at any time (effective at the end of the month) by phone call to the office staff.   Patient agreed to services and verbal consent obtained.   Follow up plan:   Raynicia Dukes [employer] Scheduler

## 2020-02-19 DIAGNOSIS — Z1231 Encounter for screening mammogram for malignant neoplasm of breast: Secondary | ICD-10-CM | POA: Diagnosis not present

## 2020-02-19 LAB — HM MAMMOGRAPHY

## 2020-02-24 ENCOUNTER — Encounter: Payer: Self-pay | Admitting: Family Medicine

## 2020-02-27 NOTE — Addendum Note (Signed)
Addended by: Alysia Penna A on: 02/27/2020 07:28 AM   Modules accepted: Orders

## 2020-03-02 ENCOUNTER — Other Ambulatory Visit: Payer: Self-pay

## 2020-03-02 ENCOUNTER — Ambulatory Visit: Payer: PPO

## 2020-03-02 DIAGNOSIS — J309 Allergic rhinitis, unspecified: Secondary | ICD-10-CM

## 2020-03-02 DIAGNOSIS — G43001 Migraine without aura, not intractable, with status migrainosus: Secondary | ICD-10-CM

## 2020-03-02 DIAGNOSIS — E039 Hypothyroidism, unspecified: Secondary | ICD-10-CM

## 2020-03-02 DIAGNOSIS — Z87442 Personal history of urinary calculi: Secondary | ICD-10-CM

## 2020-03-02 DIAGNOSIS — E785 Hyperlipidemia, unspecified: Secondary | ICD-10-CM

## 2020-03-02 DIAGNOSIS — K219 Gastro-esophageal reflux disease without esophagitis: Secondary | ICD-10-CM

## 2020-03-02 NOTE — Chronic Care Management (AMB) (Signed)
Chronic Care Management Pharmacy  Name: Victoria Mosley  MRN: [id number] DOB: 1950/12/04  Initial Questions: 1. Have you seen any other providers since your last visit? NA 2. Any changes in your medicines or health? No   Chief Complaint/ HPI  Victoria Mosley,  69 y.o. , female presents for their Initial CCM visit with the clinical pharmacist via telephone due to COVID-19 Pandemic.  PCP : Laurey Morale, MD  Their chronic conditions include: migraine, HLD, hypothyroidism, GERD, allergic rhinitis, chronic sinusitis, osteoarthritis, menopause history of nephrolithiasis, recurrent UTIs.   Office Visits: 11/24/2019- Victoria Main Fry,MD- Patient presented for office visit for well exam. Diet and exercise was discussed with patient. Patient to obtain fasting labs soon.   10/28/2019- Victoria Penna, MD- Patient presented for virtual visit for recurrent sinus infections. Patient complained of pressure and pain in left side of her face with stuffy head and  PND. Patient prescribed doxycycline for 14 days.   Consult Visit: 11/19/2019- Otolaryngology- Victoria Knows, MD- Patient presented for office visitfor chronic left maxillary sinusitis. Nasal endoscopy was completed which showed purulent fluid pooled in the sinus. Culture was obtained and patient will be called with results. Plan: intermittent antibiotic irrigations.   11/12/2019- Infectious diseases- Victoria Basques, MD- Patient presented for office visit for chronic maxillary sinusitis. Patient has recurrent sinusitis. Confirmatory culture results needed to guide treatment. Patient to be set up with IV abx plan for PICC line IV with cefepime.   Medications: Outpatient Encounter Medications as of 03/02/2020  Medication Sig  . aspirin-acetaminophen-caffeine (EXCEDRIN MIGRAINE) 250-250-65 MG tablet Take by mouth.  Marland Kitchen CALCIUM CITRATE PO Take 1,000 mg by mouth every evening.  . cetirizine (ZYRTEC) 10 MG tablet Take 10 mg by mouth in the morning.    . Cranberry 250 MG CAPS Take 500 mg by mouth 2 (two) times daily.  . diphenhydrAMINE (BENADRYL) 25 mg capsule Take 25 mg by mouth at bedtime.  Marland Kitchen DOCUSATE SODIUM PO Take 100 mg by mouth at bedtime. Takes Monday, Wednesday, Friday, Sunday  . estradiol (ESTRACE) 0.5 MG tablet Take 1 tablet (0.5 mg total) by mouth daily.  Marland Kitchen ezetimibe (ZETIA) 10 MG tablet Take 1 tablet (10 mg total) by mouth daily.  Marland Kitchen FIORICET 50-300-40 MG CAPS TAKE ONE CAPSULE TWICE A DAY AS NEEDED FOR HEADACHE  . hydrochlorothiazide (HYDRODIURIL) 25 MG tablet Take 1 tablet (25 mg total) by mouth daily.  Marland Kitchen LACTOBACILLUS PO Take 1 tablet by mouth daily.  Marland Kitchen levothyroxine (SYNTHROID) 75 MCG tablet Take 1 tablet (75 mcg total) by mouth daily.  Marland Kitchen LEVSIN 0.125 MG tablet Take 0.125 mg by mouth as needed.   . loratadine (CLARITIN) 10 MG tablet Take 10 mg by mouth daily.  . methenamine (HIPREX) 1 g tablet Take 1 g by mouth 2 (two) times daily with a meal.  . Multiple Vitamins-Minerals (WOMENS 50+ MULTI VITAMIN/MIN PO) Take 1 tablet by mouth daily.  . naproxen sodium (ANAPROX) 220 MG tablet Take 220 mg by mouth daily.   Marland Kitchen omeprazole (PRILOSEC) 20 MG capsule Take 1 capsule (20 mg total) by mouth daily.  . prochlorperazine (COMPAZINE) 10 MG tablet Take 1 tablet (10 mg total) by mouth every 6 (six) hours as needed for nausea or vomiting.  . progesterone (PROMETRIUM) 100 MG capsule Take 1 capsule (100 mg total) by mouth at bedtime.  . propranolol (INDERAL) 40 MG tablet Take 1 tablet (40 mg total) by mouth daily.  Marland Kitchen pyridoxine (B-6) 200 MG tablet Take 200 mg by  mouth daily.  . TOBRAMYCIN OP 80 mg. EMPTY 1 CAPSULE IN SINUS RINSE BOTTLE AND USE TWICE DAILY  . traMADol (ULTRAM) 50 MG tablet Take 2 tablets (100 mg total) by mouth every 6 (six) hours as needed for moderate pain.  Marland Kitchen AMOXICILLIN PO Take 500 mg by mouth. Takes 2000mg  prior to dental procedures only  . Ascorbic Acid (VITAMIN C) 1000 MG tablet Take 1,000 mg by mouth daily.  Marland Kitchen b complex  vitamins tablet Take 1 tablet by mouth daily.  . betamethasone valerate lotion (VALISONE) 0.1 % Apply topically as needed.  . potassium gluconate 595 MG TABS Take 595 mg by mouth every evening.   . predniSONE (DELTASONE) 5 MG tablet Take 1 tablet (5 mg total) by mouth daily with breakfast. (Patient not taking: Reported on 03/03/2020)   No facility-administered encounter medications on file as of 03/02/2020.     Current Diagnosis/Assessment:  Goals Addressed            This Visit's Progress   . Pharmacy Care Plan       CARE PLAN ENTRY  Current Barriers:  . Chronic Disease Management support, education, and care coordination needs related to Hyperlipidemia, Gastroesophageal Reflux Disease, Hypothyroidism, and Migraine, Allergic rhinitis, history of kidney stones   Hyperlipidemia . Pharmacist Clinical Goal(s): o Over the next 180 days, patient will work with PharmD and providers to achieve LDL goal < 100 . Current regimen:  o Ezetimibe 10mg , 1 tablet once daily  . Interventions: . We discussed how a diet high in plant sterols (fruits/vegetables/nuts/whole grains/legumes) may reduce your cholesterol. Encouraged increasing fiber to a daily intake of 10-25g/day  . Patient self care activities - Over the next 180 days, patient will: o Continue working on diet and current medication.   Migraines  . Pharmacist Clinical Goal(s): o Over the next 180 days, patient will work with PharmD and providers to maintain migraine control. . Current regimen:   Propranolol 40mg , 1 tablet once daily   Fioricet 50-300-40mg , 1 capsule twice daily as needed for headache   Excedrin Migraine 250-250-65mg , 1 tablet as needed . Patient self care activities - Over the next 180 days, patient will: o Continue current medications.   Hypothyroidism . Pharmacist Clinical Goal(s) o Over the next 180 days, patient will work with PharmD and providers to maintain between 0.45 to 4.5uIU/ml. . Current regimen:    Levothyroxine 68mcg, 1 tablet once daily  . Patient self care activities - Over the next 180 days, patient will: o Continue current medications  GERD . Pharmacist Clinical Goal(s) o Over the next 180 days, patient will work with PharmD and providers to minimize heartburn symptoms . Current regimen:   Omeprazole 20mg , 1 capsule once daily  . Interventions: o We discussed:  non-pharmacological interventions for acid reflux. Take measures to prevent acid reflux, such as avoiding spicy foods, avoiding caffeine, avoid laying down a few hours after eating, and raising the head of the bed  . Patient self care activities o Patient will continue current medications.   Allergic rhinitis . Pharmacist Clinical Goal(s) o Over the next 180 days, patient will work with PharmD and providers to minimize allergy symptoms.  . Current regimen:   diphenhydramine 25mg , 1 capsule at bedtime   Cetirizine 10mg , 1 tablet in the morning . Interventions: discussed use of diphenhydramine in older adults . Patient self care activities o Patient will continue current medications.   History of kidney stones  . Pharmacist Clinical Goal(s) o Over the next  180 days, patient will work with PharmD and providers to decrease kidney stones. . Current regimen:   Hydrochlorothiazide 25mg , 1 tablet once daily  lactobacillus (probiotic), 1 capsule once daily . Interventions: o Recommended to discontinue vitamin C to decrease risk of kidney stones. . Patient self care activities o Patient will continue current medications and stop vitamin C.   Medication management . Pharmacist Clinical Goal(s): o Over the next 180 days, patient will work with PharmD and providers to maintain optimal medication adherence . Current [facility] . Interventions o Comprehensive medication review performed. o Continue current medication management strategy . Patient self care activities - Over the next 180 days, patient  will: o Take medications as prescribed o Report any questions or concerns to PharmD and/or provider(s)  Initial goal documentation       SDOH Interventions     Most Recent Value  SDOH Interventions  SDOH Interventions for the Following Domains  Transportation, Financial Strain  Financial Strain Interventions  Other (Comment) [Not needed]  Transportation Interventions  Other (Comment) [not needed]       Migraines   Denies dizziness/lightedheadedness/ orthostatic hypotension.   Migraines triggered by sleep (after naps) or lack of sleep. Patient reports taking   Patient is controlled on:   Propranolol 40mg , 1 tablet once daily   Fioricet 50-300-40mg , 1 capsule twice daily as needed for headache (some days does not take at all)   Excedrin Migraine 250-250-65mg , 1 tablet as needed  (will take if catching migraine early)  Plan Continue current medications   Hyperlipidemia  Patient not open to taking statins again.   Lipid Panel     Component Value Date/Time   CHOL 193 11/28/2019 0900   TRIG 279.0 (H) 11/28/2019 0900   TRIG 91 10/05/2006 1002   HDL 41.30 11/28/2019 0900   CHOLHDL 5 11/28/2019 0900   VLDL 55.8 (H) 11/28/2019 0900   LDLCALC 115 (H) 11/19/2017 0917   LDLDIRECT 122.0 11/28/2019 0900    The 10-year ASCVD risk score Mikey Bussing DC Jr., et al., 2013) is: 8.8%   Values used to calculate the score:     Age: 36 years     Sex: Female     Is Non-Hispanic African American: No     Diabetic: No     Tobacco smoker: No     Systolic Blood Pressure: 123456 mmHg     Is BP treated: Yes     HDL Cholesterol: 41.3 mg/dL     Total Cholesterol: 193 mg/dL   Patient has failed these meds in past: pravastatin (joint pain)   Patient is currently uncontrolled on the following medications:   Ezetimibe 10mg , 1 tablet once daily   We discussed:  diet and exercise extensively  . How to reduce cholesterol through diet/weight management and physical activity.    - Patient unable to  exercise due to limited mobility - Diet: patient has limited red meat and fatty foods intake. Has incorporated vegetables. . We discussed how a diet high in plant sterols (fruits/vegetables/nuts/whole grains/legumes) may reduce your cholesterol. Encouraged increasing fiber to a daily intake of 10-25g/day   Plan Continue current medications and control with diet and exercise.   Hypothyroidism   TSH  Date Value Ref Range Status  11/28/2019 5.16 (H) 0.35 - 4.50 uIU/mL Final    Patient has failed these meds in past: none  Patient is currently controlled on the following medications:  Levothyroxine 63mcg, 1 tablet once daily   Plan Continue current medications  GERD  Denies hiatal hernia. Patient stated symptoms were associated when she would lay down. States this works for her.  Patient reported triggers: spicy food, raw onion, garlic, tomatoes   Patient has failed these meds in past: Protonix Patient is currently controlled on the following medications:   Omeprazole 20mg , 1 capsule once daily   We discussed:  non-pharmacological interventions for acid reflux. Take measures to prevent acid reflux, such as avoiding spicy foods, avoiding caffeine, avoid laying down a few hours after eating, and raising the head of the bed   Plan Continue current medications.   Allergic rhinitis    Patient has failed these meds in past: none  Patient is currently controlled on the following medications:   diphenhydramine 25mg , 1 capsule at bedtime   Cetirizine 10mg , 1 tablet in the morning   We discussed:  use of diphenhydramine in older adults ([place]). Patient reported taking at night.   Plan Continue current medications  Chronic sinusitis   Patient reported seeing numerous providers. She stated undergoing surgery in 2017. Currently she uses Netti pot and states it has been helping keep it cleared out.   Patient has failed these meds in past: several antibiotics (including  doxycycline) Patient is currently controlled on the following medications:  - tobramycin 80mg , in sinus rinse bottle and use twice daily.   Plan Continue current medications    Osteoarthritis   Patient stated the following were recommended/ prescribed by Dr. Annie Main.   Patient is currently controlled on the following medications:  - Aleve 220mg  every morning  - tramadol 50mg , 2 tablets every six hours as needed for moderate pain  - vitamin B-6 200mg , 1 tablet once daily  Plan Continue current medications  Menopause   Patient states being on hormones since 1989 due to menopause. Patient states has tried to d/c- but noticed irritability and hot flashes. Patient plans to see new provider (Dr. Melanee Spry). Previously managed by Dr. Phineas Real.  She states she was told she needed to be on this for rest of her life.   Patient has failed these meds in past: none Patient is currently controlled on the following medications:   Estradiol 0.5mg , 1 tablet once daily  Progesterone 100mg ,  1 capsule at bedtime   Plan Continue current medications  History of nephrolithiasis  Patient stated being placed on this to prevent kidney stones. She stated undergoing 4 surgeries for kidney stones removal.   Patient is currently controlled on the following medications:   Hydrochlorothiazide 25mg , 1 tablet once daily  lactobacillus (probiotic), 1 capsule once daily   We discussed:  long term use of vitamin C and increased risk of recurrent kidney stones   Plan Managed by urology ([facility]). Recommend discontinuing vitamin C. Continue current medications   Recurrent UTIs  Patient states has had so many UTIs in the past and is on current regimen to decrease incidence.    Patient is currently controlled on the following medications:  - methenamine 1 gram, 1 tablet twice daily - cranberry 500mg , 1 capsule twice daily  Plan Managed by Urologist (Dr.Herrick).  Continue current  medications  OTC/ supplements/ misc  Patient is currently on the following:  - calcium citrate 1000mg , 1 tablet every evening  - vitamin B complex-  Patient planning to hold and will see how she feels before restarting  - docusate 100mg - 1 tablet M,W,F,Sunday  - prochlorperazine 10mg , 1 tablet every six hours as needed for nausea/ vomiting - betamethasone valerate 0.1% lotion- uses as  needed  - hyoscyamine 0.125mg , 1 tablet as needed  - potassium gluconate 595mg , 1 tablet once daily   Medication Management  Patient organizes medications: uses pill box for a month at a time   [facility]  Adherence: no gaps in refill history (per medication dispense history from 09/04/2019 to 03/02/2020)   Follow-up Follow up visit with PharmD in 6 months. Will conduct general telephone calls for periodic check-ins before next visit.  Recommend discontinuation of vitamin C (to decrease risk of recurrent nephrolithiasis)   Anson Crofts, PharmD Clinical Pharmacist [facility] [phone]

## 2020-03-03 DIAGNOSIS — N390 Urinary tract infection, site not specified: Secondary | ICD-10-CM | POA: Insufficient documentation

## 2020-03-03 DIAGNOSIS — Z780 Asymptomatic menopausal state: Secondary | ICD-10-CM | POA: Insufficient documentation

## 2020-03-03 NOTE — Patient Instructions (Addendum)
Visit Information  Goals Addressed            This Visit's Progress   . Pharmacy Care Plan       CARE PLAN ENTRY  Current Barriers:  . Chronic Disease Management support, education, and care coordination needs related to Hyperlipidemia, Gastroesophageal Reflux Disease, Hypothyroidism, and Migraine, Allergic rhinitis, history of kidney stones   Hyperlipidemia . Pharmacist Clinical Goal(s): o Over the next 180 days, patient will work with PharmD and providers to achieve LDL goal < 100 . Current regimen:  o Ezetimibe 10mg , 1 tablet once daily  . Interventions: . We discussed how a diet high in plant sterols (fruits/vegetables/nuts/whole grains/legumes) may reduce your cholesterol. Encouraged increasing fiber to a daily intake of 10-25g/day  . Patient self care activities - Over the next 180 days, patient will: o Continue working on diet and current medication.   Migraines  . Pharmacist Clinical Goal(s): o Over the next 180 days, patient will work with PharmD and providers to maintain migraine control. . Current regimen:   Propranolol 40mg , 1 tablet once daily   Fioricet 50-300-40mg , 1 capsule twice daily as needed for headache   Excedrin Migraine 250-250-65mg , 1 tablet as needed . Patient self care activities - Over the next 180 days, patient will: o Continue current medications.   Hypothyroidism . Pharmacist Clinical Goal(s) o Over the next 180 days, patient will work with PharmD and providers to maintain between 0.45 to 4.5uIU/ml. . Current regimen:   Levothyroxine 99mcg, 1 tablet once daily  . Patient self care activities - Over the next 180 days, patient will: o Continue current medications  GERD . Pharmacist Clinical Goal(s) o Over the next 180 days, patient will work with PharmD and providers to minimize heartburn symptoms . Current regimen:   Omeprazole 20mg , 1 capsule once daily  . Interventions: o We discussed:  non-pharmacological interventions for acid  reflux. Take measures to prevent acid reflux, such as avoiding spicy foods, avoiding caffeine, avoid laying down a few hours after eating, and raising the head of the bed  . Patient self care activities o Patient will continue current medications.   Allergic rhinitis . Pharmacist Clinical Goal(s) o Over the next 180 days, patient will work with PharmD and providers to minimize allergy symptoms.  . Current regimen:   diphenhydramine 25mg , 1 capsule at bedtime   Cetirizine 10mg , 1 tablet in the morning . Interventions: discussed use of diphenhydramine in older adults . Patient self care activities o Patient will continue current medications.   History of kidney stones  . Pharmacist Clinical Goal(s) o Over the next 180 days, patient will work with PharmD and providers to decrease kidney stones. . Current regimen:   Hydrochlorothiazide 25mg , 1 tablet once daily  lactobacillus (probiotic), 1 capsule once daily . Interventions: o Recommended to discontinue vitamin C to decrease risk of kidney stones. . Patient self care activities o Patient will continue current medications and stop vitamin C.   Medication management . Pharmacist Clinical Goal(s): o Over the next 180 days, patient will work with PharmD and providers to maintain optimal medication adherence . Current pharmacy: [facility] . Interventions o Comprehensive medication review performed. o Continue current medication management strategy . Patient self care activities - Over the next 180 days, patient will: o Take medications as prescribed o Report any questions or concerns to PharmD and/or provider(s)  Initial goal documentation        Ms. Grabe was given information about Chronic Care Management services  today including:  1. CCM service includes personalized support from designated clinical staff supervised by her physician, including individualized plan of care and coordination with other care  providers 2. 24/7 contact phone numbers for assistance for urgent and routine care needs. 3. Standard insurance, coinsurance, copays and deductibles apply for chronic care management only during months in which we provide at least 20 minutes of these services. Most insurances cover these services at 100%, however patients may be responsible for any copay, coinsurance and/or deductible if applicable. This service may help you avoid the need for more expensive face-to-face services. 4. Only one practitioner may furnish and bill the service in a calendar month. 5. The patient may stop CCM services at any time (effective at the end of the month) by phone call to the office staff.  Patient agreed to services and verbal consent obtained.   The patient verbalized understanding of instructions provided today and agreed to receive a mailed copy of patient instruction and/or educational materials. Telephone follow up appointment with pharmacy team member scheduled for: 09/02/2020  Anson Crofts, PharmD Clinical Pharmacist [facility] [phone]   High Cholesterol  High cholesterol is a condition in which the blood has high levels of a white, waxy, fat-like substance (cholesterol). The human body needs small amounts of cholesterol. The liver makes all the cholesterol that the body needs. Extra (excess) cholesterol comes from the food that we eat. Cholesterol is carried from the liver by the blood through the blood vessels. If you have high cholesterol, deposits (plaques) may build up on the walls of your blood vessels (arteries). Plaques make the arteries narrower and stiffer. Cholesterol plaques increase your risk for heart attack and stroke. Work with your health care provider to keep your cholesterol levels in a healthy range. What increases the risk? This condition is more likely to develop in people who:  Eat foods that are high in animal fat (saturated fat) or  cholesterol.  Are overweight.  Are not getting enough exercise.  Have a family history of high cholesterol. What are the signs or symptoms? There are no symptoms of this condition. How is this diagnosed? This condition may be diagnosed from the results of a blood test.  If you are older than age 30, your health care provider may check your cholesterol every 4-6 years.  You may be checked more often if you already have high cholesterol or other risk factors for heart disease. The blood test for cholesterol measures:  "Bad" cholesterol (LDL cholesterol). This is the main type of cholesterol that causes heart disease. The desired level for LDL is less than 100.  "Good" cholesterol (HDL cholesterol). This type helps to protect against heart disease by cleaning the arteries and carrying the LDL away. The desired level for HDL is 60 or higher.  Triglycerides. These are fats that the body can store or burn for energy. The desired number for triglycerides is lower than 150.  Total cholesterol. This is a measure of the total amount of cholesterol in your blood, including LDL cholesterol, HDL cholesterol, and triglycerides. A healthy number is less than 200. How is this treated? This condition is treated with diet changes, lifestyle changes, and medicines. Diet changes  This may include eating more whole grains, fruits, vegetables, nuts, and fish.  This may also include cutting back on red meat and foods that have a lot of added sugar. Lifestyle changes  Changes may include getting at least 40 minutes of  aerobic exercise 3 times a week. Aerobic exercises include walking, biking, and swimming. Aerobic exercise along with a healthy diet can help you maintain a healthy weight.  Changes may also include quitting smoking. Medicines  Medicines are usually given if diet and lifestyle changes have failed to reduce your cholesterol to healthy levels.  Your health care provider may prescribe a  statin medicine. Statin medicines have been shown to reduce cholesterol, which can reduce the risk of heart disease. Follow these instructions at home: Eating and drinking If told by your health care provider:  Eat chicken (without skin), fish, veal, shellfish, ground [place] breast, and round or loin cuts of red meat.  Do not eat fried foods or fatty meats, such as hot dogs and salami.  Eat plenty of fruits, such as apples.  Eat plenty of vegetables, such as broccoli, potatoes, and carrots.  Eat beans, peas, and lentils.  Eat grains such as barley, rice, couscous, and bulgur wheat.  Eat pasta without cream sauces.  Use skim or nonfat milk, and eat low-fat or nonfat yogurt and cheeses.  Do not eat or drink whole milk, cream, ice cream, egg yolks, or hard cheeses.  Do not eat stick margarine or tub margarines that contain trans fats (also called partially hydrogenated oils).  Do not eat saturated tropical oils, such as coconut oil and palm oil.  Do not eat cakes, cookies, crackers, or other baked goods that contain trans fats.  General instructions  Exercise as directed by your health care provider. Increase your activity level with activities such as gardening, walking, and taking the stairs.  Take over-the-counter and prescription medicines only as told by your health care provider.  Do not use any products that contain nicotine or tobacco, such as cigarettes and e-cigarettes. If you need help quitting, ask your health care provider.  Keep all follow-up visits as told by your health care provider. This is important. Contact a health care provider if:  You are struggling to maintain a healthy diet or weight.  You need help to start on an exercise program.  You need help to stop smoking. Get help right away if:  You have chest pain.  You have trouble breathing. This information is not intended to replace advice given to you by your health care provider. Make sure you  discuss any questions you have with your health care provider. Document Revised: 10/19/2017 Document Reviewed: 04/15/2016 Elsevier Patient Education  [place].

## 2020-03-12 ENCOUNTER — Telehealth: Payer: Self-pay | Admitting: Family Medicine

## 2020-03-12 DIAGNOSIS — M810 Age-related osteoporosis without current pathological fracture: Secondary | ICD-10-CM

## 2020-03-12 NOTE — Telephone Encounter (Signed)
I ordered the DXA, she just needs to schedule it now

## 2020-03-12 NOTE — Telephone Encounter (Signed)
Patient is wanting Dr. Sarajane Jews to place an order for her Bone Density so we can schedule her appointment.  Please advise

## 2020-03-12 NOTE — Addendum Note (Signed)
Addended by: Alysia Penna A on: 03/12/2020 04:52 PM   Modules accepted: Orders

## 2020-03-15 NOTE — Telephone Encounter (Signed)
Spoke with the patient. She is aware that the order has been placed and that the imaging center will call her to schedule.

## 2020-03-17 DIAGNOSIS — M7138 Other bursal cyst, other site: Secondary | ICD-10-CM | POA: Diagnosis not present

## 2020-03-17 DIAGNOSIS — L309 Dermatitis, unspecified: Secondary | ICD-10-CM | POA: Diagnosis not present

## 2020-03-17 DIAGNOSIS — B078 Other viral warts: Secondary | ICD-10-CM | POA: Diagnosis not present

## 2020-03-17 DIAGNOSIS — L239 Allergic contact dermatitis, unspecified cause: Secondary | ICD-10-CM | POA: Diagnosis not present

## 2020-03-23 ENCOUNTER — Ambulatory Visit (INDEPENDENT_AMBULATORY_CARE_PROVIDER_SITE_OTHER)
Admission: RE | Admit: 2020-03-23 | Discharge: 2020-03-23 | Disposition: A | Discharge status: Home / Self Care | Payer: PPO | Source: Ambulatory Visit | Attending: Family Medicine | Admitting: Family Medicine

## 2020-03-23 ENCOUNTER — Other Ambulatory Visit: Payer: Self-pay

## 2020-03-23 DIAGNOSIS — M810 Age-related osteoporosis without current pathological fracture: Secondary | ICD-10-CM | POA: Diagnosis not present

## 2020-05-12 ENCOUNTER — Other Ambulatory Visit: Payer: Self-pay

## 2020-05-12 ENCOUNTER — Encounter: Payer: Self-pay | Admitting: Obstetrics and Gynecology

## 2020-05-12 ENCOUNTER — Encounter: Payer: PPO | Admitting: Gynecology

## 2020-05-12 ENCOUNTER — Ambulatory Visit (INDEPENDENT_AMBULATORY_CARE_PROVIDER_SITE_OTHER): Payer: PPO | Admitting: Obstetrics and Gynecology

## 2020-05-12 VITALS — BP 122/76 | Ht 68.0 in | Wt 194.0 lb

## 2020-05-12 DIAGNOSIS — R87810 Cervical high risk human papillomavirus (HPV) DNA test positive: Secondary | ICD-10-CM

## 2020-05-12 DIAGNOSIS — Z79890 Hormone replacement therapy (postmenopausal): Secondary | ICD-10-CM | POA: Diagnosis not present

## 2020-05-12 DIAGNOSIS — R8290 Unspecified abnormal findings in urine: Secondary | ICD-10-CM | POA: Diagnosis not present

## 2020-05-12 DIAGNOSIS — Z01419 Encounter for gynecological examination (general) (routine) without abnormal findings: Secondary | ICD-10-CM

## 2020-05-12 DIAGNOSIS — Z9189 Other specified personal risk factors, not elsewhere classified: Secondary | ICD-10-CM

## 2020-05-12 DIAGNOSIS — Z124 Encounter for screening for malignant neoplasm of cervix: Secondary | ICD-10-CM | POA: Diagnosis not present

## 2020-05-12 DIAGNOSIS — M8580 Other specified disorders of bone density and structure, unspecified site: Secondary | ICD-10-CM

## 2020-05-12 DIAGNOSIS — Z9289 Personal history of other medical treatment: Secondary | ICD-10-CM

## 2020-05-12 DIAGNOSIS — Z8619 Personal history of other infectious and parasitic diseases: Secondary | ICD-10-CM

## 2020-05-12 MED ORDER — ESTRADIOL 0.5 MG PO TABS: 0.5000 mg | ORAL_TABLET | Freq: Every day | ORAL | 4 refills | Status: DC

## 2020-05-12 MED ORDER — PROGESTERONE MICRONIZED 100 MG PO CAPS
100.0000 mg | ORAL_CAPSULE | Freq: Every day | ORAL | 4 refills | Status: DC
Start: 2020-05-12 — End: 2021-05-17

## 2020-05-12 NOTE — Progress Notes (Signed)
Victoria Mosley [birth year] [id number]  SUBJECTIVE:  69 y.o. G1P1001 female for annual routine gynecologic exam and Pap smear (history persistent HPV noted). She has no gynecologic concerns.  Current Outpatient Medications  Medication Sig Dispense Refill  . Ascorbic Acid (VITAMIN C) 1000 MG tablet Take 1,000 mg by mouth daily.    Marland Kitchen aspirin-acetaminophen-caffeine (EXCEDRIN MIGRAINE) 250-250-65 MG tablet Take by mouth.    Marland Kitchen b complex vitamins tablet Take 1 tablet by mouth daily.    . betamethasone valerate lotion (VALISONE) 0.1 % Apply topically as needed.    Marland Kitchen CALCIUM CITRATE PO Take 1,000 mg by mouth every evening.    . Cranberry 250 MG CAPS Take 500 mg by mouth 2 (two) times daily.    . diphenhydrAMINE (BENADRYL) 25 mg capsule Take 25 mg by mouth at bedtime.    Marland Kitchen DOCUSATE SODIUM PO Take 100 mg by mouth at bedtime. Takes Monday, Wednesday, Friday, Sunday    . estradiol (ESTRACE) 0.5 MG tablet Take 1 tablet (0.5 mg total) by mouth daily. 90 tablet 4  . ezetimibe (ZETIA) 10 MG tablet Take 1 tablet (10 mg total) by mouth daily. 90 tablet 3  . FIORICET 50-300-40 MG CAPS TAKE ONE CAPSULE TWICE A DAY AS NEEDED FOR HEADACHE 60 capsule 5  . hydrochlorothiazide (HYDRODIURIL) 25 MG tablet Take 1 tablet (25 mg total) by mouth daily. 90 tablet 3  . LACTOBACILLUS PO Take 1 tablet by mouth daily.    Marland Kitchen levothyroxine (SYNTHROID) 75 MCG tablet Take 1 tablet (75 mcg total) by mouth daily. 90 tablet 3  . loratadine (CLARITIN) 10 MG tablet Take 10 mg by mouth daily.    . methenamine (HIPREX) 1 g tablet Take 1 g by mouth 2 (two) times daily with a meal.    . Multiple Vitamins-Minerals (WOMENS 50+ MULTI VITAMIN/MIN PO) Take 1 tablet by mouth daily.    . naproxen sodium (ANAPROX) 220 MG tablet Take 220 mg by mouth daily.     Marland Kitchen omeprazole (PRILOSEC) 20 MG capsule Take 1 capsule (20 mg total) by mouth daily. 90 capsule 3  . predniSONE (DELTASONE) 5 MG tablet Take 1 tablet (5 mg total) by mouth daily with  breakfast. 30 tablet 2  . prochlorperazine (COMPAZINE) 10 MG tablet Take 1 tablet (10 mg total) by mouth every 6 (six) hours as needed for nausea or vomiting. 30 tablet 5  . progesterone (PROMETRIUM) 100 MG capsule Take 1 capsule (100 mg total) by mouth at bedtime. 90 capsule 4  . propranolol (INDERAL) 40 MG tablet Take 1 tablet (40 mg total) by mouth daily. 90 tablet 3  . pyridoxine (B-6) 200 MG tablet Take 200 mg by mouth daily.    . TOBRAMYCIN OP 80 mg. EMPTY 1 CAPSULE IN SINUS RINSE BOTTLE AND USE TWICE DAILY    . traMADol (ULTRAM) 50 MG tablet Take 2 tablets (100 mg total) by mouth every 6 (six) hours as needed for moderate pain. 120 tablet 5  . AMOXICILLIN PO Take 500 mg by mouth. Takes 2000mg  prior to dental procedures only (Patient not taking: Reported on 05/12/2020)    . LEVSIN 0.125 MG tablet Take 0.125 mg by mouth as needed.  (Patient not taking: Reported on 05/12/2020)     No current facility-administered medications for this visit.   Allergies: Levaquin [levofloxacin in d5w], Pravastatin sodium [pravachol], Septra [sulfamethoxazole-trimethoprim], Biaxin [clarithromycin], Celebrex [celecoxib], Ciprofloxacin, Clarithromycin, Codeine, Dust mite extract, and Keflex [cephalexin]  No LMP recorded. Patient is postmenopausal.  Past medical history,surgical  history, problem list, medications, allergies, family history and social history were all reviewed and documented as reviewed in the EPIC chart.  ROS:  Feeling well. No dyspnea or chest pain on exertion.  No abdominal pain, change in bowel habits, black or bloody stools.  No urinary tract symptoms. GYN ROS: no abnormal bleeding, pelvic pain or discharge, no breast pain or new or enlarging lumps on self exam.No neurological complaints.    OBJECTIVE:  BP 122/76   Ht 5\' 8"  (1.727 m)   Wt 194 lb (88 kg)   BMI 29.50 kg/m  The patient appears well, alert, oriented x 3, in no distress. ENT normal.  Neck supple. No cervical or  supraclavicular adenopathy or thyromegaly.  Lungs are clear, good air entry, no wheezes, rhonchi or rales. S1 and S2 normal, no murmurs, regular rate and rhythm.  Abdomen soft without tenderness, guarding, mass or organomegaly.  Neurological is normal, no focal findings.  BREAST EXAM: breasts appear normal, no suspicious masses, no skin or nipple changes or axillary nodes  PELVIC EXAM: VULVA: normal appearing vulva with no masses, tenderness or lesions, VAGINA: normal appearing vagina with normal color and discharge, no lesions, CERVIX: normal appearing cervix without discharge or lesions, cervical os appears to be visible, UTERUS: uterus is normal size, shape, consistency and nontender, ADNEXA: normal adnexa in size, nontender and no masses, PAP: Pap smear done today, thin-prep method  Chaperone: [name] present during the examination Aurora Mask (DNP student) present during the examination and performed the breast exam with me in attendance to confirm the exam findings   ASSESSMENT:  69 y.o. G1P1001 here for annual gynecologic exam  PLAN:   1. Postmenopausal/HRT.  Currently taking estradiol 0.5 mg and Prometrium 100 mg at bedtime.  Apparently she went through early menopause in her 93s and has been on hormone HRT since then.  She has tried weaning off with unacceptable side effects.  She understands the risks of thrombotic disease to include heart attack, stroke, DVT, PE, and the breast cancer issue, uterine cancer risk.  She would like to continue understanding the risks and also the benefits and positive effects which she feels outweigh the risks at this time.  Refill x1 provided for both the estradiol and Prometrium. 2. 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  Pap smears the last 2 years (2020 and 2019) showed normal cytology but positive high-risk HPV.  2018 ASCUS cytology positive high-risk HPV, colposcopy was attempted but due to significant cervical  stenosis the endocervical canal assessment was not possible.  Consultation with Dr. Denman George in gynecologic oncology and they had decided together that hysterectomy was not the best option.  2017 normal cytology positive high-risk HPV, negative for subtype 16, 18/45.  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  Pap smear/HPV collected today. Discussed recommendation for continued Pap smears annually as long as HPV remains positive.  She remains not interested in hysterectomy at this time with the understanding that this would not relieve her of the need to continue with Pap smears surveillance, and this would not necessarily get rid of the HPV infection as vaginal and vulvar tissues can also harbor HPV.  Recommend colposcopy if there are any abnormal cytology changes directed on the Pap smear. 3. Mammogram 01/2020.  Normal breast exam today.  We will continue with annual mammograms.   4. Colonoscopy 2018.  Recommended that she follow up at the recommended interval. 5.  Osteopenia.  DEXA 02/2020 through Dr.  Fry's office.  T score -1.1 at the distal right radius.  Normal lumbar spine BMD.  Follow-up recommendations per Dr. Sarajane Jews, I would recommend repeating the DEXA in 2 years. 6.  History of chronic/recurrent UTIs, followed by urology.  No specific symptoms today other than the usual foul urinary odor that she notices.  No frequency, dysuria, low back pain, fever or chill.  Will check urinalysis today. 7. Health maintenance.  No labs today as she normally has these completed with her primary care doctor.  Return annually or sooner, prn.  Joseph Pierini MD 05/12/20

## 2020-05-12 NOTE — Addendum Note (Signed)
Addended by: Nelva Nay on: 05/12/2020 10:30 AM   Modules accepted: Orders

## 2020-05-12 NOTE — Addendum Note (Signed)
Addended by: Nelva Nay on: 05/12/2020 10:42 AM   Modules accepted: Orders

## 2020-05-15 LAB — URINALYSIS, COMPLETE W/RFL CULTURE
Bilirubin Urine: NEGATIVE
Glucose, UA: NEGATIVE
Hgb urine dipstick: NEGATIVE
Nitrites, Initial: POSITIVE — AB
Protein, ur: NEGATIVE
Specific Gravity, Urine: 1.022 (ref 1.001–1.03)
pH: 6.5 (ref 5.0–8.0)

## 2020-05-15 LAB — URINE CULTURE
MICRO NUMBER:: 10708521
SPECIMEN QUALITY:: ADEQUATE

## 2020-05-15 LAB — CULTURE INDICATED

## 2020-05-17 ENCOUNTER — Telehealth: Payer: Self-pay | Admitting: *Deleted

## 2020-05-17 ENCOUNTER — Other Ambulatory Visit: Payer: Self-pay | Admitting: Nurse Practitioner

## 2020-05-17 DIAGNOSIS — N3000 Acute cystitis without hematuria: Secondary | ICD-10-CM

## 2020-05-17 LAB — PAP IG AND HPV HIGH-RISK: HPV DNA High Risk: NOT DETECTED

## 2020-05-17 MED ORDER — NITROFURANTOIN MONOHYD MACRO 100 MG PO CAPS: 100.0000 mg | ORAL_CAPSULE | Freq: Two times a day (BID) | ORAL | 0 refills | Status: AC

## 2020-05-17 NOTE — Telephone Encounter (Signed)
Dr.Kendall patient ) patient received postive urine culture results from [place] on 05/12/20 and is requesting Macrobid to help treat symptoms.  Please advise

## 2020-05-17 NOTE — Progress Notes (Signed)
Urine culture 05/12/2020 + E. Coli with sensitivity to Nitrofurantoin. Nitrofurantoin 100 mg for 7 days prescribed. She has a history of recurrent UTIs and sees urology for this.

## 2020-05-17 NOTE — Telephone Encounter (Signed)
Nitrofurantoin 100 mg twice a day for 7 days sent to her pharmacy.

## 2020-05-17 NOTE — Telephone Encounter (Signed)
Patient informed. 

## 2020-05-24 ENCOUNTER — Encounter: Payer: Self-pay | Admitting: Obstetrics and Gynecology

## 2020-06-17 ENCOUNTER — Telehealth: Payer: Self-pay | Admitting: Family Medicine

## 2020-06-17 NOTE — Telephone Encounter (Signed)
Call in Prednisone 5 mg daily as need for mouth sores, #30 with 2 rf

## 2020-06-17 NOTE — Telephone Encounter (Signed)
Pt is asking for a refill on a med that helps with her mouth sores she gets periodically.  Medication: Prednisone   Pharmacy:  [facility] [address], [place] - [address] Phone:  [phone]  Fax:  [phone]

## 2020-06-18 MED ORDER — PREDNISONE 5 MG PO TABS: 5.0000 mg | ORAL_TABLET | Freq: Every day | ORAL | 2 refills | Status: DC

## 2020-06-18 NOTE — Addendum Note (Signed)
Addended by: Rebecca Eaton on: 06/18/2020 09:03 AM   Modules accepted: Orders

## 2020-06-18 NOTE — Telephone Encounter (Signed)
Rx sent in. Spoke with the patient. She is aware her medication has been sent in.

## 2020-07-10 DIAGNOSIS — Z20822 Contact with and (suspected) exposure to covid-19: Secondary | ICD-10-CM | POA: Diagnosis not present

## 2020-07-10 DIAGNOSIS — Z03818 Encounter for observation for suspected exposure to other biological agents ruled out: Secondary | ICD-10-CM | POA: Diagnosis not present

## 2020-08-24 ENCOUNTER — Other Ambulatory Visit: Payer: Self-pay | Admitting: Family Medicine

## 2020-08-24 NOTE — Telephone Encounter (Signed)
Last OV: 11/24/19 Last refill: 11/24/19 #120/5 refills

## 2020-09-02 ENCOUNTER — Ambulatory Visit: Payer: PPO

## 2020-09-02 DIAGNOSIS — G43001 Migraine without aura, not intractable, with status migrainosus: Secondary | ICD-10-CM

## 2020-09-02 DIAGNOSIS — E785 Hyperlipidemia, unspecified: Secondary | ICD-10-CM

## 2020-09-02 NOTE — Chronic Care Management (AMB) (Signed)
Chronic Care Management Pharmacy  Name: Victoria Mosley  MRN: [id number] DOB: 01/14/1951  Initial Questions: 1. Have you seen any other providers since your last visit? Yes  2. Any changes in your medicines or health? No   Chief Complaint/ HPI  Victoria Mosley,  69 y.o. , female presents for their Follow-Up CCM visit with the clinical pharmacist via telephone due to COVID-19 Pandemic.  PCP : Laurey Morale, MD  Their chronic conditions include: migraine, HLD, hypothyroidism, GERD, allergic rhinitis, chronic sinusitis, osteoarthritis, menopause history of nephrolithiasis, recurrent UTIs.   Office Visits: 11/24/2019- Annie Main Fry,MD- Patient presented for office visit for well exam. Diet and exercise was discussed with patient. Patient to obtain fasting labs soon.   10/28/2019- Alysia Penna, MD- Patient presented for virtual visit for recurrent sinus infections. Patient complained of pressure and pain in left side of her face with stuffy head and  PND. Patient prescribed doxycycline for 14 days.   Consult Visit: 05/12/20 Joseph Pierini, MD (gynecology): Patient presented for pelvic and breast exam. Pap smear results were normal. Urine culture showed E. Coli growth and urinalysis was positive for nitrites and leukocyte esterases. Refilled HRT. Prescribed Macrobid x 7 days for UTI.  Follow up in 1 year.  03/17/20 Cindie Crumbly (dermatology): Patient presented for follow up. Unable to access notes.  11/19/2019- Otolaryngology- Hessie Knows, MD- Patient presented for office visitfor chronic left maxillary sinusitis. Nasal endoscopy was completed which showed purulent fluid pooled in the sinus. Culture was obtained and patient will be called with results. Plan: intermittent antibiotic irrigations.   11/12/2019- Infectious diseases- Carlyle Basques, MD- Patient presented for office visit for chronic maxillary sinusitis. Patient has recurrent sinusitis. Confirmatory culture results needed to  guide treatment. Patient to be set up with IV abx plan for PICC line IV with cefepime.   Medications: Outpatient Encounter Medications as of 09/02/2020  Medication Sig  . AMOXICILLIN PO Take 500 mg by mouth. Takes 2000mg  prior to dental procedures only (Patient not taking: Reported on 05/12/2020)  . Ascorbic Acid (VITAMIN C) 1000 MG tablet Take 1,000 mg by mouth daily.  Marland Kitchen aspirin-acetaminophen-caffeine (EXCEDRIN MIGRAINE) 250-250-65 MG tablet Take by mouth.  Marland Kitchen b complex vitamins tablet Take 1 tablet by mouth daily.  . betamethasone valerate lotion (VALISONE) 0.1 % Apply topically as needed.  Marland Kitchen CALCIUM CITRATE PO Take 1,000 mg by mouth every evening.  . Cranberry 250 MG CAPS Take 500 mg by mouth 2 (two) times daily.  . diphenhydrAMINE (BENADRYL) 25 mg capsule Take 25 mg by mouth at bedtime.  Marland Kitchen DOCUSATE SODIUM PO Take 100 mg by mouth at bedtime. Takes Monday, Wednesday, Friday, Sunday  . estradiol (ESTRACE) 0.5 MG tablet Take 1 tablet (0.5 mg total) by mouth daily.  Marland Kitchen ezetimibe (ZETIA) 10 MG tablet Take 1 tablet (10 mg total) by mouth daily.  Marland Kitchen FIORICET 50-300-40 MG CAPS TAKE ONE CAPSULE TWICE A DAY AS NEEDED FOR HEADACHE  . hydrochlorothiazide (HYDRODIURIL) 25 MG tablet Take 1 tablet (25 mg total) by mouth daily.  Marland Kitchen LACTOBACILLUS PO Take 1 tablet by mouth daily.  Marland Kitchen levothyroxine (SYNTHROID) 75 MCG tablet Take 1 tablet (75 mcg total) by mouth daily.  Marland Kitchen LEVSIN 0.125 MG tablet Take 0.125 mg by mouth as needed.  (Patient not taking: Reported on 05/12/2020)  . loratadine (CLARITIN) 10 MG tablet Take 10 mg by mouth daily.  . methenamine (HIPREX) 1 g tablet Take 1 g by mouth 2 (two) times daily with a meal.  .  Multiple Vitamins-Minerals (WOMENS 50+ MULTI VITAMIN/MIN PO) Take 1 tablet by mouth daily.  . naproxen sodium (ANAPROX) 220 MG tablet Take 220 mg by mouth daily.   Marland Kitchen omeprazole (PRILOSEC) 20 MG capsule Take 1 capsule (20 mg total) by mouth daily.  . predniSONE (DELTASONE) 5 MG tablet Take 1  tablet (5 mg total) by mouth daily with breakfast.  . prochlorperazine (COMPAZINE) 10 MG tablet Take 1 tablet (10 mg total) by mouth every 6 (six) hours as needed for nausea or vomiting.  . progesterone (PROMETRIUM) 100 MG capsule Take 1 capsule (100 mg total) by mouth at bedtime.  . progesterone (PROMETRIUM) 100 MG capsule Take 1 capsule (100 mg total) by mouth daily.  . propranolol (INDERAL) 40 MG tablet Take 1 tablet (40 mg total) by mouth daily.  Marland Kitchen pyridoxine (B-6) 200 MG tablet Take 200 mg by mouth daily.  . TOBRAMYCIN OP 80 mg. EMPTY 1 CAPSULE IN SINUS RINSE BOTTLE AND USE TWICE DAILY  . traMADol (ULTRAM) 50 MG tablet TAKE 2 TABLETS BY MOUTH EVERY 6 HOURS AS NEEDED FOR  MODERATE  PAIN   No facility-administered encounter medications on file as of 09/02/2020.   Patient reports her current problem is her left ankle and is aware that the only cure is an ankle replacement but she does not wish to do this. She does wear ankle braces and uses pain medication to help. She also reports checking her BP at home every once in a while and has seen numbers around 123/67.   Current Diagnosis/Assessment:  Goals Addressed            This Visit's Progress   . Pharmacy Care Plan       CARE PLAN ENTRY  Current Barriers:  . Chronic Disease Management support, education, and care coordination needs related to Hyperlipidemia, Gastroesophageal Reflux Disease, Hypothyroidism, and Migraine, Allergic rhinitis, history of kidney stones   Hyperlipidemia . Pharmacist Clinical Goal(s): o Over the next 180 days, patient will work with PharmD and providers to achieve LDL goal < 100 . Current regimen:  o Ezetimibe 10mg , 1 tablet once daily  . Interventions: . Discussed Lowering cholesterol through diet by: Marland Kitchen Limiting foods with cholesterol such as liver and other organ meats, egg yolks, shrimp, and whole milk dairy products . Avoiding saturated fats and trans fats and incorporating healthier fats, such as  lean meat, nuts, and unsaturated oils like canola and olive oils . Eating foods with soluble fiber such as whole-grain cereals such as oatmeal and oat bran, fruits such as apples, bananas, oranges, pears, and prunes, legumes such as kidney beans, lentils, chick peas, black-eyed peas, and lima beans, and green leafy vegetables . Limiting alcohol intake . Patient self care activities - Over the next 180 days, patient will: o Continue working on diet and current medication.   Migraines  . Pharmacist Clinical Goal(s): o Over the next 180 days, patient will work with PharmD and providers to maintain migraine control. . Current regimen:   Propranolol 40mg , 1 tablet once daily   Fioricet 50-300-40mg , 1 capsule twice daily as needed for headache   Excedrin Migraine 250-250-65mg , 1 tablet as needed . Patient self care activities - Over the next 180 days, patient will: o Continue current medications.   Hypothyroidism . Pharmacist Clinical Goal(s) o Over the next 180 days, patient will work with PharmD and providers to maintain between 0.45 to 4.5uIU/ml. . Current regimen:   Levothyroxine 48mcg, 1 tablet once daily  . Patient self care  activities - Over the next 180 days, patient will: o Continue current medications  GERD . Pharmacist Clinical Goal(s) o Over the next 180 days, patient will work with PharmD and providers to minimize heartburn symptoms . Current regimen:   Omeprazole 20mg , 1 capsule once daily  . Interventions: o We discussed:  non-pharmacological interventions for acid reflux. Take measures to prevent acid reflux, such as avoiding spicy foods, avoiding caffeine, avoid laying down a few hours after eating, and raising the head of the bed  . Patient self care activities o Patient will continue current medications.   Allergic rhinitis . Pharmacist Clinical Goal(s) o Over the next 180 days, patient will work with PharmD and providers to minimize allergy symptoms.  . Current  regimen:   diphenhydramine 25mg , 1 capsule at bedtime   Cetirizine 10mg , 1 tablet in the morning . Interventions: discussed use of diphenhydramine in older adults . Patient self care activities o Patient will continue current medications.   History of kidney stones  . Pharmacist Clinical Goal(s) o Over the next 180 days, patient will work with PharmD and providers to decrease kidney stones. . Current regimen:   Hydrochlorothiazide 25mg , 1 tablet once daily  lactobacillus (probiotic), 1 capsule once daily . Interventions: o Recommended to discontinue vitamin C to decrease risk of kidney stones and recommended trial of zinc to prevent colds. . Patient self care activities o Patient will continue current medications and stop vitamin C.   Medication management . Pharmacist Clinical Goal(s): o Over the next 180 days, patient will work with PharmD and providers to maintain optimal medication adherence . Current [facility] . Interventions o Comprehensive medication review performed. o Continue current medication management strategy . Patient self care activities - Over the next 180 days, patient will: o Take medications as prescribed o Report any questions or concerns to PharmD and/or provider(s)  Please see past updates related to this goal by clicking on the "Past Updates" button in the selected goal          Migraines   Endorses dizziness/lightedheadedness. Patient believes this is due to sinuses.  Patient is controlled on:   Propranolol 40mg , 1 tablet once daily   Fioricet 50-300-40mg , 1 capsule twice daily as needed for headache (some days does not take at all)   Excedrin Migraine 250-250-65mg , 1 tablet as needed  (will take if catching migraine early) - very seldom  Plan Continue current medications   Hyperlipidemia  Patient not open to taking statins again.   Lipid Panel     Component Value Date/Time   CHOL 193 11/28/2019 0900   TRIG 279.0 (H)  11/28/2019 0900   TRIG 91 10/05/2006 1002   HDL 41.30 11/28/2019 0900   CHOLHDL 5 11/28/2019 0900   VLDL 55.8 (H) 11/28/2019 0900   LDLCALC 115 (H) 11/19/2017 0917   LDLDIRECT 122.0 11/28/2019 0900    The 10-year ASCVD risk score Mikey Bussing DC Jr., et al., 2013) is: 10%   Values used to calculate the score:     Age: 32 years     Sex: Female     Is Non-Hispanic African American: No     Diabetic: No     Tobacco smoker: No     Systolic Blood Pressure: 010 mmHg     Is BP treated: Yes     HDL Cholesterol: 41.3 mg/dL     Total Cholesterol: 193 mg/dL   Patient has failed these meds in past: pravastatin (joint pain)   Patient is  currently uncontrolled on the following medications:   Ezetimibe 10mg , 1 tablet once daily   We discussed:  diet and exercise extensively  Lowering cholesterol through diet by:  Limiting foods with cholesterol such as liver and other organ meats, egg yolks, shrimp, and whole milk dairy products  Avoiding saturated fats and trans fats and incorporating healthier fats, such as lean meat, nuts, and unsaturated oils like canola and olive oils  Eating foods with soluble fiber such as whole-grain cereals such as oatmeal and oat bran, fruits such as apples, bananas, oranges, pears, and prunes, legumes such as kidney beans, lentils, chick peas, black-eyed peas, and lima beans, and green leafy vegetables  Limiting alcohol intake  Plan Continue current medications and control with diet and exercise.   Hypothyroidism   TSH  Date Value Ref Range Status  11/28/2019 5.16 (H) 0.35 - 4.50 uIU/mL Final    Patient has failed these meds in past: none  Patient is currently controlled on the following medications:  Levothyroxine 28mcg, 1 tablet once daily   We discussed: consistent administration on an empty stomach with water and before other medications  Plan Continue current medications  GERD  Denies hiatal hernia. Patient stated symptoms were associated when she would  lay down. States this works for her.  Patient reported triggers: spicy food, raw onion, garlic, tomatoes   Patient has failed these meds in past: Protonix Patient is currently controlled on the following medications:   Omeprazole 20mg , 1 capsule once daily   We discussed:  non-pharmacological interventions for acid reflux. Take measures to prevent acid reflux, such as avoiding spicy foods, avoiding caffeine, avoid laying down a few hours after eating, and raising the head of the bed   Plan Continue current medications.   Allergic rhinitis    Patient has failed these meds in past: none  Patient is currently controlled on the following medications:   diphenhydramine 25mg , 1 capsule at bedtime   Cetirizine 10mg , 1 tablet in the morning   We discussed:  use of diphenhydramine in older adults ([place]). Patient reported taking at night.   Plan Continue current medications  Chronic sinusitis   Patient reported seeing numerous providers. She stated undergoing surgery in 2017. Currently she uses Netti pot and states it has been helping keep it cleared out.   Patient has failed these meds in past: several antibiotics (including doxycycline) Patient is currently controlled on the following medications:  - tobramycin 80mg , in sinus rinse bottle and use twice daily.   Plan Continue current medications    Osteoarthritis   Patient stated the following were recommended/prescribed by Dr. Annie Main.   Patient is currently controlled on the following medications:  - Aleve 220mg  every morning  - tramadol 50mg , 2 tablets every six hours as needed for moderate pain (takes 2-3 times per week) - vitamin B-6 200mg , 1 tablet once daily - Biofreeze on back and ankles  We discussed: recommended trying Voltaren gel for joint pain and lower risk of kidney effects and GI bleeds  Plan Continue current medications  Menopause   Patient states being on hormones since 1989 due to menopause.  Patient states has tried to d/c- but noticed irritability and hot flashes. Patient plans to see new provider (Dr. Melanee Spry). Previously managed by Dr. Phineas Real.  She states she was told she needed to be on this for rest of her life.   Patient has failed these meds in past: none Patient is currently controlled on the following medications:  Estradiol 0.5mg , 1 tablet once daily  Progesterone 100mg ,  1 capsule at bedtime   Plan Continue current medications  History of nephrolithiasis  Patient stated being placed on this to prevent kidney stones. She stated undergoing 4 surgeries for kidney stones removal.   Patient is currently controlled on the following medications:   Hydrochlorothiazide 25mg , 1 tablet once daily  lactobacillus (probiotic), 1 capsule once daily   We discussed:  long term use of vitamin C and increased risk of recurrent kidney stones   Plan Managed by urology ([place]). Recommend discontinuing vitamin C. Continue current medications   Recurrent UTIs  Patient states has had so many UTIs in the past and is on current regimen to decrease incidence.    Patient is currently controlled on the following medications:  - methenamine 1 gram, 1 tablet twice daily - cranberry 500mg , 1 capsule twice daily  Plan Managed by Urologist (Dr.Herrick).  Continue current medications  OTC/ supplements/ misc  Patient is currently on the following:  - calcium citrate 1000mg , 1 tablet every evening  - vitamin B complex-  Patient planning to hold and will see how she feels before restarting  - docusate 100mg - 1 tablet M,W,F,Sunday  - prochlorperazine 10mg , 1 tablet every six hours as needed for nausea/ vomiting - betamethasone valerate 0.1% lotion- uses as needed  - hyoscyamine 0.125mg , 1 tablet as needed  - potassium gluconate 595mg , 1 tablet once daily  We discussed: risk of taking vitamin C on recurrent kidney stones. Patient stated she had stopped and then immediately  got a cold. Recommended swapping out for zinc, which does not carry that risk.   Vaccines   Reviewed and discussed patient's vaccination history.    Immunization History  Administered Date(s) Administered  . Fluad Quad(high Dose 65+) 08/25/2019  . Influenza Split 07/11/2012  . Influenza Whole 08/19/2010  . Influenza, High Dose Seasonal PF 07/26/2017, 08/15/2018  . Influenza,inj,quad, With Preservative 07/26/2017  . Influenza-Unspecified 07/28/2015, 07/26/2016  . PFIZER SARS-COV-2 Vaccination 11/24/2019, 12/15/2019  . Pneumococcal Conjugate-13 07/29/2015  . Pneumococcal Polysaccharide-23 05/28/2012, 08/15/2018  . Td 10/30/2001  . Tdap 05/28/2012  . Zoster 05/28/2012   Patient reported getting influenza vaccine October 28th and Pfizer booster Sept 28th at [place] message to [place] to add to chart.  Plan  Recommended patient receive Shingles vaccine at pharmacy.   Medication Management   [facility] Uses pill box? Yes - for one month at a time Pt endorses 100% compliance  We discussed: Current pharmacy is preferred with insurance plan and patient is satisfied with pharmacy services  Plan  Continue current medication management strategy   Follow up: 6 month phone visit  Jeni Salles, PharmD Clinical Pharmacist [place] at [place]

## 2020-09-02 NOTE — Patient Instructions (Addendum)
Hi Victoria Mosley!  It was great to get to meet you over the phone today! As we discussed, consider trying Voltaren gel for joint pain as this can be very helpful and can give you quick pain relief. I also think it would be beneficial to stop taking the vitamin C to reduce any risks of getting kidney stones again, it has been proven that taking vitamin C consisently can cause this. Consider replacing with Zinc or only taking when you feel like you are getting a cold. Lastly, don't forget to look into getting the shingles shot (Shingrix) at the pharmacy.  Please call me if you have any questions or need anything before our next touch base!  Best, Maddie  Jeni Salles, PharmD Clinical Pharmacist [facility]   Visit Information  Goals Addressed            This Visit's Progress   . Pharmacy Care Plan       CARE PLAN ENTRY  Current Barriers:  . Chronic Disease Management support, education, and care coordination needs related to Hyperlipidemia, Gastroesophageal Reflux Disease, Hypothyroidism, and Migraine, Allergic rhinitis, history of kidney stones   Hyperlipidemia . Pharmacist Clinical Goal(s): o Over the next 180 days, patient will work with PharmD and providers to achieve LDL goal < 100 . Current regimen:  o Ezetimibe 10mg , 1 tablet once daily  . Interventions: . Discussed Lowering cholesterol through diet by: Marland Kitchen Limiting foods with cholesterol such as liver and other organ meats, egg yolks, shrimp, and whole milk dairy products . Avoiding saturated fats and trans fats and incorporating healthier fats, such as lean meat, nuts, and unsaturated oils like canola and olive oils . Eating foods with soluble fiber such as whole-grain cereals such as oatmeal and oat bran, fruits such as apples, bananas, oranges, pears, and prunes, legumes such as kidney beans, lentils, chick peas, black-eyed peas, and lima beans, and green leafy vegetables . Limiting alcohol  intake . Patient self care activities - Over the next 180 days, patient will: o Continue working on diet and current medication.   Migraines  . Pharmacist Clinical Goal(s): o Over the next 180 days, patient will work with PharmD and providers to maintain migraine control. . Current regimen:   Propranolol 40mg , 1 tablet once daily   Fioricet 50-300-40mg , 1 capsule twice daily as needed for headache   Excedrin Migraine 250-250-65mg , 1 tablet as needed . Patient self care activities - Over the next 180 days, patient will: o Continue current medications.   Hypothyroidism . Pharmacist Clinical Goal(s) o Over the next 180 days, patient will work with PharmD and providers to maintain between 0.45 to 4.5uIU/ml. . Current regimen:   Levothyroxine 38mcg, 1 tablet once daily  . Patient self care activities - Over the next 180 days, patient will: o Continue current medications  GERD . Pharmacist Clinical Goal(s) o Over the next 180 days, patient will work with PharmD and providers to minimize heartburn symptoms . Current regimen:   Omeprazole 20mg , 1 capsule once daily  . Interventions: o We discussed:  non-pharmacological interventions for acid reflux. Take measures to prevent acid reflux, such as avoiding spicy foods, avoiding caffeine, avoid laying down a few hours after eating, and raising the head of the bed  . Patient self care activities o Patient will continue current medications.   Allergic rhinitis . Pharmacist Clinical Goal(s) o Over the next 180 days, patient will work with PharmD and providers to minimize allergy symptoms.  . Current  regimen:   diphenhydramine 25mg , 1 capsule at bedtime   Cetirizine 10mg , 1 tablet in the morning . Interventions: discussed use of diphenhydramine in older adults . Patient self care activities o Patient will continue current medications.   History of kidney stones  . Pharmacist Clinical Goal(s) o Over the next 180 days, patient will  work with PharmD and providers to decrease kidney stones. . Current regimen:   Hydrochlorothiazide 25mg , 1 tablet once daily  lactobacillus (probiotic), 1 capsule once daily . Interventions: o Recommended to discontinue vitamin C to decrease risk of kidney stones and recommended trial of zinc to prevent colds. . Patient self care activities o Patient will continue current medications and stop vitamin C.   Medication management . Pharmacist Clinical Goal(s): o Over the next 180 days, patient will work with PharmD and providers to maintain optimal medication adherence . Current pharmacy: [facility] . Interventions o Comprehensive medication review performed. o Continue current medication management strategy . Patient self care activities - Over the next 180 days, patient will: o Take medications as prescribed o Report any questions or concerns to PharmD and/or provider(s)  Please see past updates related to this goal by clicking on the "Past Updates" button in the selected goal         The patient verbalized understanding of instructions provided today and declined a print copy of patient instruction materials.   Telephone follow up appointment with pharmacy team member scheduled for: May 2022    Dietary Guidelines to Help Prevent Kidney Stones Kidney stones are deposits of minerals and salts that form inside your kidneys. Your risk of developing kidney stones may be greater depending on your diet, your lifestyle, the medicines you take, and whether you have certain medical conditions. Most people can reduce their chances of developing kidney stones by following the instructions below. Depending on your overall health and the type of kidney stones you tend to develop, your dietitian may give you more specific instructions. What are tips for following this plan? Reading food labels  Choose foods with "no salt added" or "low-salt" labels. Limit your sodium intake to less than 1500  mg per day.  Choose foods with calcium for each meal and snack. Try to eat about 300 mg of calcium at each meal. Foods that contain 200-500 mg of calcium per serving include: ? 8 oz (237 ml) of milk, fortified nondairy milk, and fortified fruit juice. ? 8 oz (237 ml) of kefir, yogurt, and soy yogurt. ? 4 oz (118 ml) of tofu. ? 1 oz of cheese. ? 1 cup (300 g) of dried figs. ? 1 cup (91 g) of cooked broccoli. ? 1-3 oz can of sardines or mackerel.  Most people need 1000 to 1500 mg of calcium each day. Talk to your dietitian about how much calcium is recommended for you. Shopping  Buy plenty of fresh fruits and vegetables. Most people do not need to avoid fruits and vegetables, even if they contain nutrients that may contribute to kidney stones.  When shopping for convenience foods, choose: ? Whole pieces of fruit. ? Premade salads with dressing on the side. ? Low-fat fruit and yogurt smoothies.  Avoid buying frozen meals or prepared deli foods.  Look for foods with live cultures, such as yogurt and kefir. Cooking  Do not add salt to food when cooking. Place a salt shaker on the table and allow each person to add his or her own salt to taste.  Use vegetable protein, such  as beans, textured vegetable protein (TVP), or tofu instead of meat in pasta, casseroles, and soups. Meal planning   Eat less salt, if told by your dietitian. To do this: ? Avoid eating processed or premade food. ? Avoid eating fast food.  Eat less animal protein, including cheese, meat, poultry, or fish, if told by your dietitian. To do this: ? Limit the number of times you have meat, poultry, fish, or cheese each week. Eat a diet free of meat at least 2 days a week. ? Eat only one serving each day of meat, poultry, fish, or seafood. ? When you prepare animal protein, cut pieces into small portion sizes. For most meat and fish, one serving is about the size of one deck of cards.  Eat at least 5 servings of fresh  fruits and vegetables each day. To do this: ? Keep fruits and vegetables on hand for snacks. ? Eat 1 piece of fruit or a handful of berries with breakfast. ? Have a salad and fruit at lunch. ? Have two kinds of vegetables at dinner.  Limit foods that are high in a substance called oxalate. These include: ? Spinach. ? Rhubarb. ? Beets. ? Potato chips and french fries. ? Nuts.  If you regularly take a diuretic medicine, make sure to eat at least 1-2 fruits or vegetables high in potassium each day. These include: ? Avocado. ? Banana. ? Orange, prune, carrot, or tomato juice. ? Baked potato. ? Cabbage. ? Beans and split peas. General instructions   Drink enough fluid to keep your urine clear or pale yellow. This is the most important thing you can do.  Talk to your health care provider and dietitian about taking daily supplements. Depending on your health and the cause of your kidney stones, you may be advised: ? Not to take supplements with vitamin C. ? To take a calcium supplement. ? To take a daily probiotic supplement. ? To take other supplements such as magnesium, fish oil, or vitamin B6.  Take all medicines and supplements as told by your health care provider.  Limit alcohol intake to no more than 1 drink a day for nonpregnant women and 2 drinks a day for men. One drink equals 12 oz of beer, 5 oz of wine, or 1 oz of hard liquor.  Lose weight if told by your health care provider. Work with your dietitian to find strategies and an eating plan that works best for you. What foods are not recommended? Limit your intake of the following foods, or as told by your dietitian. Talk to your dietitian about specific foods you should avoid based on the type of kidney stones and your overall health. Grains Breads. Bagels. Rolls. Baked goods. Salted crackers. Cereal. Pasta. Vegetables Spinach. Rhubarb. Beets. Canned vegetables. Angie Fava. Olives. Meats and other protein foods Nuts. Nut  butters. Large portions of meat, poultry, or fish. Salted or cured meats. Deli meats. Hot dogs. Sausages. Dairy Cheese. Beverages Regular soft drinks. Regular vegetable juice. Seasonings and other foods Seasoning blends with salt. Salad dressings. Canned soups. Soy sauce. Ketchup. Barbecue sauce. Canned pasta sauce. Casseroles. Pizza. Lasagna. Frozen meals. Potato chips. [place] fries. Summary  You can reduce your risk of kidney stones by making changes to your diet.  The most important thing you can do is drink enough fluid. You should drink enough fluid to keep your urine clear or pale yellow.  Ask your health care provider or dietitian how much protein from animal sources you should eat  each day, and also how much salt and calcium you should have each day. This information is not intended to replace advice given to you by your health care provider. Make sure you discuss any questions you have with your health care provider. Document Revised: 02/05/2019 Document Reviewed: 09/26/2016 Elsevier Patient Education  2020 [employer].

## 2020-09-06 ENCOUNTER — Ambulatory Visit (INDEPENDENT_AMBULATORY_CARE_PROVIDER_SITE_OTHER): Payer: PPO

## 2020-09-06 DIAGNOSIS — Z0000 Encounter for general adult medical examination without abnormal findings: Secondary | ICD-10-CM | POA: Diagnosis not present

## 2020-09-06 DIAGNOSIS — M79672 Pain in left foot: Secondary | ICD-10-CM | POA: Insufficient documentation

## 2020-09-06 DIAGNOSIS — M19072 Primary osteoarthritis, left ankle and foot: Secondary | ICD-10-CM | POA: Diagnosis not present

## 2020-09-06 NOTE — Patient Instructions (Signed)
Victoria Mosley , Thank you for taking time to come for your Medicare Wellness Visit. I appreciate your ongoing commitment to your health goals. Please review the following plan we discussed and let me know if I can assist you in the future.   Screening recommendations/referrals: Colonoscopy: Up to date, next due 01/30/2027 Mammogram: Up to date, next due 02/18/2021 Bone Density: No longer required  Recommended yearly ophthalmology/optometry visit for glaucoma screening and checkup Recommended yearly dental visit for hygiene and checkup  Vaccinations: Influenza vaccine: Up to date, next due fall 2022  Pneumococcal vaccine: Completed series  Tdap vaccine: Up to date, next due 05/28/2022 Shingles vaccine: Currently due for Shingrix, we recommend that you obtain this at your local pharmacy as it will be less expensive.  Advanced directives: Please bring copies of your advanced medical directives into our office so that we may scan them into your chart.  Conditions/risks identified: None   Next appointment: 03/01/2021 @ 9:00 am with Pharmacist    Preventive Care 68 Years and Older, Female Preventive care refers to lifestyle choices and visits with your health care provider that can promote health and wellness. What does preventive care include?  A yearly physical exam. This is also called an annual well check.  Dental exams once or twice a year.  Routine eye exams. Ask your health care provider how often you should have your eyes checked.  Personal lifestyle choices, including:  Daily care of your teeth and gums.  Regular physical activity.  Eating a healthy diet.  Avoiding tobacco and drug use.  Limiting alcohol use.  Practicing safe sex.  Taking low-dose aspirin every day.  Taking vitamin and mineral supplements as recommended by your health care provider. What happens during an annual well check? The services and screenings done by your health care provider during your  annual well check will depend on your age, overall health, lifestyle risk factors, and family history of disease. Counseling  Your health care provider may ask you questions about your:  Alcohol use.  Tobacco use.  Drug use.  Emotional well-being.  Home and relationship well-being.  Sexual activity.  Eating habits.  History of falls.  Memory and ability to understand (cognition).  Work and work Statistician.  Reproductive health. Screening  You may have the following tests or measurements:  Height, weight, and BMI.  Blood pressure.  Lipid and cholesterol levels. These may be checked every 5 years, or more frequently if you are over 68 years old.  Skin check.  Lung cancer screening. You may have this screening every year starting at age 68 if you have a 30-pack-year history of smoking and currently smoke or have quit within the past 15 years.  Fecal occult blood test (FOBT) of the stool. You may have this test every year starting at age 68.  Flexible sigmoidoscopy or colonoscopy. You may have a sigmoidoscopy every 5 years or a colonoscopy every 10 years starting at age 68.  Hepatitis C blood test.  Hepatitis B blood test.  Sexually transmitted disease (STD) testing.  Diabetes screening. This is done by checking your blood sugar (glucose) after you have not eaten for a while (fasting). You may have this done every 1-3 years.  Bone density scan. This is done to screen for osteoporosis. You may have this done starting at age 68.  Mammogram. This may be done every 68-2 years. Talk to your health care provider about how often you should have regular mammograms. Talk with your health care  provider about your test results, treatment options, and if necessary, the need for more tests. Vaccines  Your health care provider may recommend certain vaccines, such as:  Influenza vaccine. This is recommended every year.  Tetanus, diphtheria, and acellular pertussis (Tdap, Td)  vaccine. You may need a Td booster every 10 years.  Zoster vaccine. You may need this after age 68.  Pneumococcal 13-valent conjugate (PCV13) vaccine. One dose is recommended after age 68.  Pneumococcal polysaccharide (PPSV23) vaccine. One dose is recommended after age 68. Talk to your health care provider about which screenings and vaccines you need and how often you need them. This information is not intended to replace advice given to you by your health care provider. Make sure you discuss any questions you have with your health care provider. Document Released: 11/12/2015 Document Revised: 07/05/2016 Document Reviewed: 08/17/2015 Elsevier Interactive Patient Education  2017 [place] Prevention in the Home Falls can cause injuries. They can happen to people of all ages. There are many things you can do to make your home safe and to help prevent falls. What can I do on the outside of my home?  Regularly fix the edges of walkways and driveways and fix any cracks.  Remove anything that might make you trip as you walk through a door, such as a raised step or threshold.  Trim any bushes or trees on the path to your home.  Use bright outdoor lighting.  Clear any walking paths of anything that might make someone trip, such as rocks or tools.  Regularly check to see if handrails are loose or broken. Make sure that both sides of any steps have handrails.  Any raised decks and porches should have guardrails on the edges.  Have any leaves, snow, or ice cleared regularly.  Use sand or salt on walking paths during winter.  Clean up any spills in your garage right away. This includes oil or grease spills. What can I do in the bathroom?  Use night lights.  Install grab bars by the toilet and in the tub and shower. Do not use towel bars as grab bars.  Use non-skid mats or decals in the tub or shower.  If you need to sit down in the shower, use a plastic, non-slip  stool.  Keep the floor dry. Clean up any water that spills on the floor as soon as it happens.  Remove soap buildup in the tub or shower regularly.  Attach bath mats securely with double-sided non-slip rug tape.  Do not have throw rugs and other things on the floor that can make you trip. What can I do in the bedroom?  Use night lights.  Make sure that you have a light by your bed that is easy to reach.  Do not use any sheets or blankets that are too big for your bed. They should not hang down onto the floor.  Have a firm chair that has side arms. You can use this for support while you get dressed.  Do not have throw rugs and other things on the floor that can make you trip. What can I do in the kitchen?  Clean up any spills right away.  Avoid walking on wet floors.  Keep items that you use a lot in easy-to-reach places.  If you need to reach something above you, use a strong step stool that has a grab bar.  Keep electrical cords out of the way.  Do not use floor polish  or wax that makes floors slippery. If you must use wax, use non-skid floor wax.  Do not have throw rugs and other things on the floor that can make you trip. What can I do with my stairs?  Do not leave any items on the stairs.  Make sure that there are handrails on both sides of the stairs and use them. Fix handrails that are broken or loose. Make sure that handrails are as long as the stairways.  Check any carpeting to make sure that it is firmly attached to the stairs. Fix any carpet that is loose or worn.  Avoid having throw rugs at the top or bottom of the stairs. If you do have throw rugs, attach them to the floor with carpet tape.  Make sure that you have a light switch at the top of the stairs and the bottom of the stairs. If you do not have them, ask someone to add them for you. What else can I do to help prevent falls?  Wear shoes that:  Do not have high heels.  Have rubber bottoms.  Are  comfortable and fit you well.  Are closed at the toe. Do not wear sandals.  If you use a stepladder:  Make sure that it is fully opened. Do not climb a closed stepladder.  Make sure that both sides of the stepladder are locked into place.  Ask someone to hold it for you, if possible.  Clearly mark and make sure that you can see:  Any grab bars or handrails.  First and last steps.  Where the edge of each step is.  Use tools that help you move around (mobility aids) if they are needed. These include:  Canes.  Walkers.  Scooters.  Crutches.  Turn on the lights when you go into a dark area. Replace any light bulbs as soon as they burn out.  Set up your furniture so you have a clear path. Avoid moving your furniture around.  If any of your floors are uneven, fix them.  If there are any pets around you, be aware of where they are.  Review your medicines with your doctor. Some medicines can make you feel dizzy. This can increase your chance of falling. Ask your doctor what other things that you can do to help prevent falls. This information is not intended to replace advice given to you by your health care provider. Make sure you discuss any questions you have with your health care provider. Document Released: 08/12/2009 Document Revised: 03/23/2016 Document Reviewed: 11/20/2014 Elsevier Interactive Patient Education  2017 [employer].

## 2020-09-06 NOTE — Progress Notes (Signed)
Subjective:   Victoria Mosley is a 69 y.o. female who presents for Medicare Annual (Subsequent) preventive examination.  I connected with Jacelynn Birman  today by telephone and verified that I am speaking with the correct person using two identifiers. Location patient: home Location provider: work Persons participating in the virtual visit: patient, provider.   I discussed the limitations, risks, security and privacy concerns of performing an evaluation and management service by telephone and the availability of in person appointments. I also discussed with the patient that there may be a patient responsible charge related to this service. The patient expressed understanding and verbally consented to this telephonic visit.    Interactive audio and video telecommunications were attempted between this provider and patient, however failed, due to patient having technical difficulties OR patient did not have access to video capability.  We continued and completed visit with audio only.      Review of Systems    N/A  Cardiac Risk Factors include: advanced age (>42men, >93 women);dyslipidemia     Objective:    Today's Vitals   09/06/20 0947  PainSc: 2    There is no height or weight on file to calculate BMI.  Advanced Directives 09/06/2020 08/15/2018 04/30/2017 02/15/2017 02/13/2017 02/02/2017 02/02/2017  Does Patient Have a Medical Advance Directive? Yes Yes No No No No No  Type of Paramedic of [place];Living will - - - - - -  Does patient want to make changes to medical advance directive? No - Patient declined - - - - - -  Copy of [place] in Chart? No - copy requested - - - - - -  Would patient like information on creating a medical advance directive? - - No - Patient declined No - Patient declined No - Patient declined No - Patient declined No - Patient declined  Pre-existing out of facility DNR order (yellow form or pink MOST form) - - - -  - - -    Current Medications (verified) Outpatient Encounter Medications as of 09/06/2020  Medication Sig  . Ascorbic Acid (VITAMIN C) 1000 MG tablet Take 1,000 mg by mouth daily.  Marland Kitchen b complex vitamins tablet Take 1 tablet by mouth daily.  Marland Kitchen CALCIUM CITRATE PO Take 1,000 mg by mouth every evening.  . Cranberry 250 MG CAPS Take 500 mg by mouth 2 (two) times daily.  . diphenhydrAMINE (BENADRYL) 25 mg capsule Take 25 mg by mouth at bedtime.  Marland Kitchen DOCUSATE SODIUM PO Take 100 mg by mouth at bedtime. Takes Monday, Wednesday, Friday, Sunday  . estradiol (ESTRACE) 0.5 MG tablet Take 1 tablet (0.5 mg total) by mouth daily.  Marland Kitchen ezetimibe (ZETIA) 10 MG tablet Take 1 tablet (10 mg total) by mouth daily.  Marland Kitchen FIORICET 50-300-40 MG CAPS TAKE ONE CAPSULE TWICE A DAY AS NEEDED FOR HEADACHE  . hydrochlorothiazide (HYDRODIURIL) 25 MG tablet Take 1 tablet (25 mg total) by mouth daily.  Marland Kitchen LACTOBACILLUS PO Take 1 tablet by mouth daily.  Marland Kitchen levothyroxine (SYNTHROID) 75 MCG tablet Take 1 tablet (75 mcg total) by mouth daily.  Marland Kitchen loratadine (CLARITIN) 10 MG tablet Take 10 mg by mouth daily.  . methenamine (HIPREX) 1 g tablet Take 1 g by mouth 2 (two) times daily with a meal.  . Multiple Vitamins-Minerals (WOMENS 50+ MULTI VITAMIN/MIN PO) Take 1 tablet by mouth daily.  . naproxen sodium (ANAPROX) 220 MG tablet Take 220 mg by mouth daily.   Marland Kitchen omeprazole (PRILOSEC) 20 MG  capsule Take 1 capsule (20 mg total) by mouth daily.  . predniSONE (DELTASONE) 5 MG tablet Take 1 tablet (5 mg total) by mouth daily with breakfast.  . progesterone (PROMETRIUM) 100 MG capsule Take 1 capsule (100 mg total) by mouth daily.  . propranolol (INDERAL) 40 MG tablet Take 1 tablet (40 mg total) by mouth daily.  Marland Kitchen pyridoxine (B-6) 200 MG tablet Take 200 mg by mouth daily.  . TOBRAMYCIN OP 80 mg. EMPTY 1 CAPSULE IN SINUS RINSE BOTTLE AND USE TWICE DAILY  . traMADol (ULTRAM) 50 MG tablet TAKE 2 TABLETS BY MOUTH EVERY 6 HOURS AS NEEDED FOR  MODERATE   PAIN  . zinc gluconate 50 MG tablet Take 50 mg by mouth daily.  . AMOXICILLIN PO Take 500 mg by mouth. Takes 2000mg  prior to dental procedures only (Patient not taking: Reported on 05/12/2020)  . aspirin-acetaminophen-caffeine (EXCEDRIN MIGRAINE) 250-250-65 MG tablet Take by mouth. (Patient not taking: Reported on 09/06/2020)  . betamethasone valerate lotion (VALISONE) 0.1 % Apply topically as needed. (Patient not taking: Reported on 09/06/2020)  . LEVSIN 0.125 MG tablet Take 0.125 mg by mouth as needed.  (Patient not taking: Reported on 05/12/2020)  . prochlorperazine (COMPAZINE) 10 MG tablet Take 1 tablet (10 mg total) by mouth every 6 (six) hours as needed for nausea or vomiting. (Patient not taking: Reported on 09/06/2020)  . [DISCONTINUED] progesterone (PROMETRIUM) 100 MG capsule Take 1 capsule (100 mg total) by mouth at bedtime.   No facility-administered encounter medications on file as of 09/06/2020.    Allergies (verified) Levaquin [levofloxacin in d5w], Pravastatin sodium [pravachol], Septra [sulfamethoxazole-trimethoprim], Biaxin [clarithromycin], Celebrex [celecoxib], Ciprofloxacin, Clarithromycin, Codeine, Dust mite extract, and Keflex [cephalexin]   History: Past Medical History:  Diagnosis Date  . 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 Adenomatous colon polyp 2004  . Arthritis   . Bilateral knee pain    sees Dr. Wynelle Link   . Bilateral renal cysts   . Chronic UTI (urinary tract infection)   . Complication of anesthesia   . Cough 11/30/14    cough since 11/20/14- now productine- green with green mucus out of nose- no fever  . Diverticulitis   . GERD (gastroesophageal reflux disease)   . History of kidney stones   . History of melanoma excision   . History of renal calculi   . 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 Denman George 04/30/2017, 2019 negative subtypes 16, 18/45  . Hyperlipidemia   . IBS (irritable bowel syndrome)     . Kidney stones    bilateral  . melanoma dx'd 2011   rt arm--surg only  . Menopause    sees Dr. Phineas Real  . Migraines   . Nephrolithiasis    left  . Neurogenic bladder   . Normal cardiac stress test 2012  . PONV (postoperative nausea and vomiting)   . Sepsis ([place]) 01/2016   related to infection caused by kidney stone blockage   . Tendonitis    right ankle  . Urinary urgency   . UTI (urinary tract infection)    started atb on 03-24-12   Past Surgical History:  Procedure Laterality Date  . BACK SURGERY  10/1991   L5 ruptured disc- Dr.Robinson  . CARPAL TUNNEL RELEASE    . CESAREAN SECTION  12/1980  . COLONOSCOPY  01/29/2017   per Dr. Henrene Pastor, adenomatous polyps,  repeat in 5 yrs  . CYSTOSCOPY W/  URETERAL STENT PLACEMENT Right 02/02/2017   Procedure: CYSTOSCOPY WITH RETROGRADE PYELOGRAM/URETERAL STENT PLACEMENT;  Surgeon: Irine Seal, MD;  Location: [facility];  Service: Urology;  Laterality: Right;  . CYSTOSCOPY WITH RETROGRADE PYELOGRAM, URETEROSCOPY AND STENT PLACEMENT Left 12/25/2012   Procedure: CYSTOSCOPY WITH RETROGRADE PYELOGRAM, URETEROSCOPY, EXTRACTION OF  LEFT STONE WITH BASKETAND STENT PLACEMENT;  Surgeon: Molli Hazard, MD;  Location: [facility];  Service: Urology;  Laterality: Left;  2.5 HRS   . CYSTOSCOPY/URETEROSCOPY/HOLMIUM LASER/STENT PLACEMENT Right 02/15/2017   Procedure: CYSTOSCOPY/URETEROSCOPY/HOLMIUM LASER/STENT EXCHANGE;  Surgeon: Ardis Hughs, MD;  Location: [facility];  Service: Urology;  Laterality: Right;  . FOOT SURGERY Bilateral 1987   per Dr. Alphonzo Cruise  . HEMATOMA EVACUATION  07/08/2012   Procedure: EVACUATION HEMATOMA;  Surgeon: Kristeen Miss, MD;  Location: [place] [facility];  Service: Neurosurgery;  Laterality: N/A;  Cervical Seven-thoracic One Laminectomy and Decompression of Epidural Hematoma  . HEMORRHOID BANDING     June and August 2016, Dr Rosendo Gros  . KIDNEY STONE SURGERY  2005   per Dr. Reece Agar  . LEEP  07/2015   final pathology  LGSIL with clear margins negative ECC  . LITHOTRIPSY  2007   x 3  . Christine, at L5 per Dr. Quentin Cornwall  . MELANOMA EXCISION  2007   left upper arm per dr. Teressa Senter  . NASAL SEPTUM SURGERY  1992   per Dr. Cynda Familia  . NASAL/SINUS ENDOSCOPY  2017   per Dr. Melissa Montane   . POSTERIOR CERVICAL LAMINECTOMY  07/08/2012   Procedure: POSTERIOR CERVICAL LAMINECTOMY;  Surgeon: Kristeen Miss, MD;  Location: [place] [facility];  Service: Neurosurgery;  Laterality: N/A;  Cervical Seven- thoracic One Laminectomy and Decompression of Epidural Hematoma  . TOTAL HIP ARTHROPLASTY Right 2010   [place]  . TOTAL HIP ARTHROPLASTY Left 09/27/2016   Procedure: LEFT TOTAL HIP ARTHROPLASTY ANTERIOR APPROACH;  Surgeon: Gaynelle Arabian, MD;  Location: [facility];  Service: Orthopedics;  Laterality: Left;  . TOTAL KNEE ARTHROPLASTY Right 12/07/2014   Procedure: RIGHT TOTAL KNEE ARTHROPLASTY;  Surgeon: [name], MD;  Location: [facility];  Service: Orthopedics;  Laterality: Right;  . TRIGGER FINGER RELEASE    . TUBAL LIGATION  01/1988   Family History  Problem Relation Age of Onset  . Heart disease Mother   . Hypertension Mother   . Diabetes Mother   . Hypertension Father   . Leukemia Father   . Hypertension Sister   . Heart disease Sister   . Bladder Cancer Sister   . Stomach cancer Paternal Grandmother   . Alzheimer's disease Sister   . Colon cancer Neg Hx   . Esophageal cancer Neg Hx   . Rectal cancer Neg Hx    Social History   Socioeconomic History  . Marital status: Married    Spouse name: Remo Lipps  . Number of children: 1  . Years of education: Not on file  . Highest education level: Not on file  Occupational History  . Occupation: Counselling psychologist  Tobacco Use  . Smoking status: Former Smoker    Packs/day: 0.25    Quit date: 12/20/1998    Years since quitting: 21.7  . Smokeless tobacco: Never Used  Vaping Use  . Vaping Use: Never used  Substance and Sexual Activity  . Alcohol use: No     Alcohol/week: 0.0 standard drinks  . Drug use: No  . Sexual activity: Yes    Partners: Male    Birth  control/protection: Post-menopausal    Comment: 1st intercourse 17 yo-2 partners  Other Topics Concern  . Not on file  Social History Narrative  . Not on file   Social Determinants of Health   Financial Resource Strain: Low Risk   . Difficulty of Paying Living Expenses: Not hard at all  Food Insecurity: No Food Insecurity  . Worried About Charity fundraiser in the Last Year: Never true  . Ran Out of Food in the Last Year: Never true  Transportation Needs: No Transportation Needs  . Lack of Transportation (Medical): No  . Lack of Transportation (Non-Medical): No  Physical Activity: Inactive  . Days of Exercise per Week: 0 days  . Minutes of Exercise per Session: 0 min  Stress: No Stress Concern Present  . Feeling of Stress : Not at all  Social Connections: Moderately Integrated  . Frequency of Communication with Friends and Family: More than three times a week  . Frequency of Social Gatherings with Friends and Family: Twice a week  . Attends Religious Services: More than 4 times per year  . Active Member of Clubs or Organizations: No  . Attends Archivist Meetings: Never  . Marital Status: Married    Tobacco Counseling Counseling given: Not Answered   Clinical Intake:  Pre-visit preparation completed: Yes  Pain : 0-10 Pain Score: 2  Pain Type: Chronic pain Pain Location: Head (stomach,ankle, and knee) Pain Orientation: Left Pain Descriptors / Indicators: Aching Pain Onset: More than a month ago Pain Relieving Factors: injections, infusions  Pain Relieving Factors: injections, infusions  Nutritional Risks: Nausea/ vomitting/ diarrhea Diabetes: No  How often do you need to have someone help you when you read instructions, pamphlets, or other written materials from your doctor or pharmacy?: 1 - Never What is the last grade level you completed in  school?: High School  Diabetic?No  Interpreter Needed?: No  Information entered by :: [facility] [place] of Daily Living In your present state of health, do you have any difficulty performing the following activities: 09/06/2020  Hearing? Y  Comment has tinnitus  Vision? N  Difficulty concentrating or making decisions? Y  Comment Patient states has ADHD, and has some issues with remebering  Walking or climbing stairs? N  Dressing or bathing? N  Doing errands, shopping? N  Preparing Food and eating ? Y  Using the Toilet? N  In the past six months, have you accidently leaked urine? N  Do you have problems with loss of bowel control? N  Managing your Medications? N  Managing your Finances? N  Housekeeping or managing your Housekeeping? N  Some recent data might be hidden    Patient Care Team: Laurey Morale, MD as PCP - General Louis Meckel Viona Gilmore, MD as Attending Physician (Urology) Phineas Real, Belinda Block, MD (Inactive) as Consulting Physician (Gynecology) Irene Shipper, MD as Consulting Physician (Gastroenterology) [name], MD as Consulting Physician (Orthopedic Surgery) Gaynelle Arabian, MD as Consulting Physician (Orthopedic Surgery) Marijean Niemann (Dermatology) Viona Gilmore, [facility] as Pharmacist (Pharmacist)  Indicate any recent Medical Services you may have received from other than [facility] providers in the past year (date may be approximate).     Assessment:   This is a routine wellness examination for Victoria Mosley.  Hearing/Vision screen  Hearing Screening   125Hz  250Hz  500Hz  1000Hz  2000Hz  3000Hz  4000Hz  6000Hz  8000Hz   Right ear:           Left ear:  Vision Screening Comments: Patient states gets eyes examined every year. Has mild cataract   Dietary issues and exercise activities discussed: Current Exercise Habits: The patient does not participate in regular exercise at present  Goals    . Patient Stated     Stay as healthy as possible      . Patient Stated     I would like to continue to travel. I have 15 more states to visit     . Pharmacy Care Plan     CARE PLAN ENTRY  Current Barriers:  . Chronic Disease Management support, education, and care coordination needs related to Hyperlipidemia, Gastroesophageal Reflux Disease, Hypothyroidism, and Migraine, Allergic rhinitis, history of kidney stones   Hyperlipidemia . Pharmacist Clinical Goal(s): o Over the next 180 days, patient will work with PharmD and providers to achieve LDL goal < 100 . Current regimen:  o Ezetimibe 10mg , 1 tablet once daily  . Interventions: . Discussed Lowering cholesterol through diet by: Marland Kitchen Limiting foods with cholesterol such as liver and other organ meats, egg yolks, shrimp, and whole milk dairy products . Avoiding saturated fats and trans fats and incorporating healthier fats, such as lean meat, nuts, and unsaturated oils like canola and olive oils . Eating foods with soluble fiber such as whole-grain cereals such as oatmeal and oat bran, fruits such as apples, bananas, oranges, pears, and prunes, legumes such as kidney beans, lentils, chick peas, black-eyed peas, and lima beans, and green leafy vegetables . Limiting alcohol intake . Patient self care activities - Over the next 180 days, patient will: o Continue working on diet and current medication.   Migraines  . Pharmacist Clinical Goal(s): o Over the next 180 days, patient will work with PharmD and providers to maintain migraine control. . Current regimen:   Propranolol 40mg , 1 tablet once daily   Fioricet 50-300-40mg , 1 capsule twice daily as needed for headache   Excedrin Migraine 250-250-65mg , 1 tablet as needed . Patient self care activities - Over the next 180 days, patient will: o Continue current medications.   Hypothyroidism . Pharmacist Clinical Goal(s) o Over the next 180 days, patient will work with PharmD and providers to maintain between 0.45 to 4.5uIU/ml. . Current  regimen:   Levothyroxine 98mcg, 1 tablet once daily  . Patient self care activities - Over the next 180 days, patient will: o Continue current medications  GERD . Pharmacist Clinical Goal(s) o Over the next 180 days, patient will work with PharmD and providers to minimize heartburn symptoms . Current regimen:   Omeprazole 20mg , 1 capsule once daily  . Interventions: o We discussed:  non-pharmacological interventions for acid reflux. Take measures to prevent acid reflux, such as avoiding spicy foods, avoiding caffeine, avoid laying down a few hours after eating, and raising the head of the bed  . Patient self care activities o Patient will continue current medications.   Allergic rhinitis . Pharmacist Clinical Goal(s) o Over the next 180 days, patient will work with PharmD and providers to minimize allergy symptoms.  . Current regimen:   diphenhydramine 25mg , 1 capsule at bedtime   Cetirizine 10mg , 1 tablet in the morning . Interventions: discussed use of diphenhydramine in older adults . Patient self care activities o Patient will continue current medications.   History of kidney stones  . Pharmacist Clinical Goal(s) o Over the next 180 days, patient will work with PharmD and providers to decrease kidney stones. . Current regimen:   Hydrochlorothiazide 25mg , 1 tablet once daily  lactobacillus (probiotic), 1 capsule once daily . Interventions: o Recommended to discontinue vitamin C to decrease risk of kidney stones and recommended trial of zinc to prevent colds. . Patient self care activities o Patient will continue current medications and stop vitamin C.   Medication management . Pharmacist Clinical Goal(s): o Over the next 180 days, patient will work with PharmD and providers to maintain optimal medication adherence . Current pharmacy[facility] . Interventions o Comprehensive medication review performed. o Continue current medication management strategy . Patient  self care activities - Over the next 180 days, patient will: o Take medications as prescribed o Report any questions or concerns to PharmD and/or provider(s)  Please see past updates related to this goal by clicking on the "Past Updates" button in the selected goal        Depression Screen PHQ 2/9 Scores 09/06/2020 11/12/2019 08/26/2019 11/20/2018 08/15/2018 01/28/2018 01/01/2018  PHQ - 2 Score 0 1 0 0 0 0 0  PHQ- 9 Score 0 - - - - - -    Fall Risk Fall Risk  09/06/2020 11/12/2019 08/26/2019 11/20/2018 08/15/2018  Falls in the past year? 1 0 1 0 No  Number falls in past yr: 0 - 0 - -  Injury with Fall? 0 - 1 - -  Risk for fall due to : Impaired balance/gait - - - -  Follow up Falls evaluation completed;Falls prevention discussed Falls evaluation completed - - -    Any stairs in or around the home? No  If so, are there any without handrails? No  Home free of loose throw rugs in walkways, pet beds, electrical cords, etc? Yes  Adequate lighting in your home to reduce risk of falls? Yes   ASSISTIVE DEVICES UTILIZED TO PREVENT FALLS:  Life alert? No  Use of a cane, walker or w/c? No  Grab bars in the bathroom? No  Shower chair or bench in shower? Yes  Elevated toilet seat or a handicapped toilet? No     Cognitive Function: MMSE - Mini Mental State Exam 08/15/2018  Not completed: (No Data)     6CIT Screen 09/06/2020  What Year? 0 points  What month? 0 points  What time? 0 points  Count back from 20 0 points  Months in reverse 0 points  Repeat phrase 0 points  Total Score 0    Immunizations Immunization History  Administered Date(s) Administered  . Fluad Quad(high Dose 65+) 08/25/2019, 08/26/2020  . Influenza Split 07/11/2012  . Influenza Whole 08/19/2010  . Influenza, High Dose Seasonal PF 07/26/2017, 08/15/2018  . Influenza,inj,quad, With Preservative 07/26/2017  . Influenza-Unspecified 07/28/2015, 07/26/2016  . PFIZER SARS-COV-2 Vaccination 11/24/2019, 12/15/2019  .  Pneumococcal Conjugate-13 07/29/2015  . Pneumococcal Polysaccharide-23 05/28/2012, 08/15/2018  . Td 10/30/2001  . Tdap 05/28/2012  . Zoster 05/28/2012    TDAP status: Up to date Flu Vaccine status: Up to date Pneumococcal vaccine status: Up to date Covid-19 vaccine status: Completed vaccines  Qualifies for Shingles Vaccine? Yes   Zostavax completed Yes   Shingrix Completed?: No.    Education has been provided regarding the importance of this vaccine. Patient has been advised to call insurance company to determine out of pocket expense if they have not yet received this vaccine. Advised may also receive vaccine at local pharmacy or Health Dept. Verbalized acceptance and understanding.  Screening Tests Health Maintenance  Topic Date Due  . COLONOSCOPY  01/29/2022  . MAMMOGRAM  02/18/2022  . TETANUS/TDAP  05/28/2022  . INFLUENZA  VACCINE  Completed  . DEXA SCAN  Completed  . COVID-19 Vaccine  Completed  . Hepatitis C Screening  Completed  . PNA vac Low Risk Adult  Completed    Health Maintenance  There are no preventive care reminders to display for this patient.  Colorectal cancer screening: Completed 01/29/2017. Repeat every 5 years Mammogram status: Completed 02/19/2020. Repeat every year Bone Density status: Completed 03/23/2020. Results reflect: Bone density results: NORMAL. Repeat every 0 years.  Lung Cancer Screening: (Low Dose CT Chest recommended if Age 55-80 years, 30 pack-year currently smoking OR have quit w/in 15years.) does not qualify.   Lung Cancer Screening Referral: N/A   Additional Screening:  Hepatitis C Screening: does qualify; Completed 07/29/2015  Vision Screening: Recommended annual ophthalmology exams for early detection of glaucoma and other disorders of the eye. Is the patient up to date with their annual eye exam?  Yes  Who is the provider or what is the name of the office in which the patient attends annual eye exams? Dr. Prudencio Burly If pt is not  established with a provider, would they like to be referred to a provider to establish care? No .   Dental Screening: Recommended annual dental exams for proper oral hygiene  Community Resource Referral / Chronic Care Management: CRR required this visit?  No   CCM required this visit?  No      Plan:     I have personally reviewed and noted the following in the patient's chart:   . Medical and social history . Use of alcohol, tobacco or illicit drugs  . Current medications and supplements . Functional ability and status . Nutritional status . Physical activity . Advanced directives . List of other physicians . Hospitalizations, surgeries, and ER visits in previous 12 months . Vitals . Screenings to include cognitive, depression, and falls . Referrals and appointments  In addition, I have reviewed and discussed with patient certain preventive protocols, quality metrics, and best practice recommendations. A written personalized care plan for preventive services as well as general preventive health recommendations were provided to patient.     Ofilia Neas, LPN   [id number]   Nurse Notes: None

## 2020-09-09 DIAGNOSIS — Z03818 Encounter for observation for suspected exposure to other biological agents ruled out: Secondary | ICD-10-CM | POA: Diagnosis not present

## 2020-09-09 DIAGNOSIS — Z20822 Contact with and (suspected) exposure to covid-19: Secondary | ICD-10-CM | POA: Diagnosis not present

## 2020-09-29 DIAGNOSIS — N3941 Urge incontinence: Secondary | ICD-10-CM | POA: Diagnosis not present

## 2020-11-07 DIAGNOSIS — U071 COVID-19: Secondary | ICD-10-CM

## 2020-11-07 HISTORY — DX: COVID-19: U07.1

## 2020-11-12 DIAGNOSIS — Z1152 Encounter for screening for COVID-19: Secondary | ICD-10-CM | POA: Diagnosis not present

## 2020-11-17 ENCOUNTER — Other Ambulatory Visit: Payer: Self-pay | Admitting: Family Medicine

## 2020-11-19 ENCOUNTER — Telehealth (INDEPENDENT_AMBULATORY_CARE_PROVIDER_SITE_OTHER): Payer: PPO | Admitting: Family Medicine

## 2020-11-19 DIAGNOSIS — R059 Cough, unspecified: Secondary | ICD-10-CM

## 2020-11-19 DIAGNOSIS — U071 COVID-19: Secondary | ICD-10-CM | POA: Diagnosis not present

## 2020-11-19 MED ORDER — HYDROCODONE-HOMATROPINE 5-1.5 MG/5ML PO SYRP: 5.0000 mL | ORAL_SOLUTION | Freq: Four times a day (QID) | ORAL | 0 refills | Status: AC | PRN

## 2020-11-19 NOTE — Progress Notes (Signed)
Patient ID: Victoria Mosley, female   DOB: [birth year], 70 y.o.   MRN: [id number]  This visit type was conducted due to national recommendations for restrictions regarding the COVID-19 pandemic in an effort to limit this patient's exposure and mitigate transmission in our community.   Virtual Visit via Video Note  I connected with Pam Chapel on 11/19/20 at  3:15 PM EST by a video enabled telemedicine application and verified that I am speaking with the correct person using two identifiers.  Location patient: home Location provider:work or home office Persons participating in the virtual visit: patient, provider  I discussed the limitations of evaluation and management by telemedicine and the availability of in person appointments. The patient expressed understanding and agreed to proceed.   HPI: Pam had recent diagnosis of COVID.  She states that she and her husband were preparing to go on a cruise in on January 7 did a home test in preparation for that.  Her husband was diagnosed with COVID the next day.  She started developing some symptoms January 9.  She was tested positive at that time and confirmed later on the 14th with positive PCR test.  Overall, she is improved.  She has been fully vaccinated.  No fever.  No dyspnea.  Her main symptom is cough which is keeping her awake.  Not much relief with Robitussin-DM.  No major nasal congestion.  No nausea, vomiting, or diarrhea.  She feels her mucus is thickening up somewhat.  No obvious wheezing.  No chronic lung disease.   ROS: See pertinent positives and negatives per HPI.  Past Medical History:  Diagnosis Date  . 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 Adenomatous colon polyp 2004  . Arthritis   . Bilateral knee pain    sees Dr. Wynelle Link   . Bilateral renal cysts   . Chronic UTI (urinary tract infection)   . Complication of anesthesia   . Cough 11/30/14    cough since 11/20/14- now productine- green with green mucus out of  nose- no fever  . Diverticulitis   . GERD (gastroesophageal reflux disease)   . History of kidney stones   . History of melanoma excision   . History of renal calculi   . 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 Denman George 04/30/2017, 2019 negative subtypes 16, 18/45  . Hyperlipidemia   . IBS (irritable bowel syndrome)   . Kidney stones    bilateral  . melanoma dx'd 2011   rt arm--surg only  . Menopause    sees Dr. Phineas Real  . Migraines   . Nephrolithiasis    left  . Neurogenic bladder   . Normal cardiac stress test 2012  . PONV (postoperative nausea and vomiting)   . Sepsis ([place]) 01/2016   related to infection caused by kidney stone blockage   . Tendonitis    right ankle  . Urinary urgency   . UTI (urinary tract infection)    started atb on 03-24-12    Past Surgical History:  Procedure Laterality Date  . BACK SURGERY  10/1991   L5 ruptured disc- Dr.Robinson  . CARPAL TUNNEL RELEASE    . CESAREAN SECTION  12/1980  . COLONOSCOPY  01/29/2017   per Dr. Henrene Pastor, adenomatous polyps,  repeat in 5 yrs  . CYSTOSCOPY W/ URETERAL STENT PLACEMENT Right 02/02/2017   Procedure: CYSTOSCOPY WITH RETROGRADE PYELOGRAM/URETERAL STENT PLACEMENT;  Surgeon: Irine Seal, MD;  Location: [facility];  Service: Urology;  Laterality: Right;  . CYSTOSCOPY WITH RETROGRADE PYELOGRAM, URETEROSCOPY AND STENT PLACEMENT Left 12/25/2012   Procedure: CYSTOSCOPY WITH RETROGRADE PYELOGRAM, URETEROSCOPY, EXTRACTION OF  LEFT STONE WITH BASKETAND STENT PLACEMENT;  Surgeon: Molli Hazard, MD;  Location: [facility];  Service: Urology;  Laterality: Left;  2.5 HRS   . CYSTOSCOPY/URETEROSCOPY/HOLMIUM LASER/STENT PLACEMENT Right 02/15/2017   Procedure: CYSTOSCOPY/URETEROSCOPY/HOLMIUM LASER/STENT EXCHANGE;  Surgeon: Ardis Hughs, MD;  Location: [facility];  Service: Urology;  Laterality: Right;  . FOOT SURGERY Bilateral 1987   per Dr. Alphonzo Cruise  . HEMATOMA  EVACUATION  07/08/2012   Procedure: EVACUATION HEMATOMA;  Surgeon: Kristeen Miss, MD;  Location: [facility];  Service: Neurosurgery;  Laterality: N/A;  Cervical Seven-thoracic One Laminectomy and Decompression of Epidural Hematoma  . HEMORRHOID BANDING     June and August 2016, Dr Rosendo Gros  . KIDNEY STONE SURGERY  2005   per Dr. Reece Agar  . LEEP  07/2015   final pathology LGSIL with clear margins negative ECC  . LITHOTRIPSY  2007   x 3  . [place], at L5 per Dr. Quentin Cornwall  . MELANOMA EXCISION  2007   left upper arm per dr. Teressa Senter  . NASAL SEPTUM SURGERY  1992   per Dr. Cynda Familia  . NASAL/SINUS ENDOSCOPY  2017   per Dr. Melissa Montane   . POSTERIOR CERVICAL LAMINECTOMY  07/08/2012   Procedure: POSTERIOR CERVICAL LAMINECTOMY;  Surgeon: Kristeen Miss, MD;  Location: [facility];  Service: Neurosurgery;  Laterality: N/A;  Cervical Seven- thoracic One Laminectomy and Decompression of Epidural Hematoma  . TOTAL HIP ARTHROPLASTY Right 2010   [place]  . TOTAL HIP ARTHROPLASTY Left 09/27/2016   Procedure: LEFT TOTAL HIP ARTHROPLASTY ANTERIOR APPROACH;  Surgeon: Gaynelle Arabian, MD;  Location: [facility];  Service: Orthopedics;  Laterality: Left;  . TOTAL KNEE ARTHROPLASTY Right 12/07/2014   Procedure: RIGHT TOTAL KNEE ARTHROPLASTY;  Surgeon: [name], MD;  Location: [facility];  Service: Orthopedics;  Laterality: Right;  . TRIGGER FINGER RELEASE    . TUBAL LIGATION  01/1988    Family History  Problem Relation Age of Onset  . Heart disease Mother   . Hypertension Mother   . Diabetes Mother   . Hypertension Father   . Leukemia Father   . Hypertension Sister   . Heart disease Sister   . Bladder Cancer Sister   . Stomach cancer Paternal Grandmother   . Alzheimer's disease Sister   . Colon cancer Neg Hx   . Esophageal cancer Neg Hx   . Rectal cancer Neg Hx     SOCIAL HX: Quit smoking 2000   Current Outpatient Medications:  .  HYDROcodone-homatropine (HYCODAN) 5-1.5 MG/5ML  syrup, Take 5 mLs by mouth every 6 (six) hours as needed for up to 10 days., Disp: 120 mL, Rfl: 0 .  AMOXICILLIN PO, Take 500 mg by mouth. Takes 2000mg  prior to dental procedures only (Patient not taking: Reported on 05/12/2020), Disp: , Rfl:  .  Ascorbic Acid (VITAMIN C) 1000 MG tablet, Take 1,000 mg by mouth daily., Disp: , Rfl:  .  aspirin-acetaminophen-caffeine (EXCEDRIN MIGRAINE) 250-250-65 MG tablet, Take by mouth. (Patient not taking: Reported on 09/06/2020), Disp: , Rfl:  .  b complex vitamins tablet, Take 1 tablet by mouth daily., Disp: , Rfl:  .  betamethasone valerate lotion (VALISONE) 0.1 %, Apply topically as needed. (Patient not taking: Reported on 09/06/2020), Disp: , Rfl:  .  CALCIUM CITRATE PO, Take 1,000 mg by mouth every evening., Disp: , Rfl:  .  Cranberry 250 MG CAPS, Take 500 mg by mouth 2 (two) times daily., Disp: , Rfl:  .  diphenhydrAMINE (BENADRYL) 25 mg capsule, Take 25 mg by mouth at bedtime., Disp: , Rfl:  .  DOCUSATE SODIUM PO, Take 100 mg by mouth at bedtime. Takes Monday, Wednesday, Friday, Sunday, Disp: , Rfl:  .  estradiol (ESTRACE) 0.5 MG tablet, Take 1 tablet (0.5 mg total) by mouth daily., Disp: 90 tablet, Rfl: 4 .  ezetimibe (ZETIA) 10 MG tablet, Take 1 tablet (10 mg total) by mouth daily., Disp: 90 tablet, Rfl: 3 .  FIORICET 50-300-40 MG CAPS, TAKE ONE CAPSULE TWICE A DAY AS NEEDED FOR HEADACHE, Disp: 60 capsule, Rfl: 5 .  hydrochlorothiazide (HYDRODIURIL) 25 MG tablet, Take 1 tablet (25 mg total) by mouth daily., Disp: 90 tablet, Rfl: 3 .  LACTOBACILLUS PO, Take 1 tablet by mouth daily., Disp: , Rfl:  .  levothyroxine (SYNTHROID) 75 MCG tablet, Take 1 tablet (75 mcg total) by mouth daily., Disp: 90 tablet, Rfl: 3 .  LEVSIN 0.125 MG tablet, Take 0.125 mg by mouth as needed.  (Patient not taking: Reported on 05/12/2020), Disp: , Rfl:  .  loratadine (CLARITIN) 10 MG tablet, Take 10 mg by mouth daily., Disp: , Rfl:  .  methenamine (HIPREX) 1 g tablet, Take 1 g by  mouth 2 (two) times daily with a meal., Disp: , Rfl:  .  Multiple Vitamins-Minerals (WOMENS 50+ MULTI VITAMIN/MIN PO), Take 1 tablet by mouth daily., Disp: , Rfl:  .  naproxen sodium (ANAPROX) 220 MG tablet, Take 220 mg by mouth daily. , Disp: , Rfl:  .  omeprazole (PRILOSEC) 20 MG capsule, Take 1 capsule (20 mg total) by mouth daily., Disp: 90 capsule, Rfl: 3 .  predniSONE (DELTASONE) 5 MG tablet, Take 1 tablet (5 mg total) by mouth daily with breakfast., Disp: 30 tablet, Rfl: 2 .  prochlorperazine (COMPAZINE) 10 MG tablet, Take 1 tablet (10 mg total) by mouth every 6 (six) hours as needed for nausea or vomiting. (Patient not taking: Reported on 09/06/2020), Disp: 30 tablet, Rfl: 5 .  progesterone (PROMETRIUM) 100 MG capsule, Take 1 capsule (100 mg total) by mouth daily., Disp: 90 capsule, Rfl: 4 .  propranolol (INDERAL) 40 MG tablet, Take 1 tablet (40 mg total) by mouth daily., Disp: 90 tablet, Rfl: 3 .  pyridoxine (B-6) 200 MG tablet, Take 200 mg by mouth daily., Disp: , Rfl:  .  TOBRAMYCIN OP, 80 mg. EMPTY 1 CAPSULE IN SINUS RINSE BOTTLE AND USE TWICE DAILY, Disp: , Rfl:  .  traMADol (ULTRAM) 50 MG tablet, TAKE 2 TABLETS BY MOUTH EVERY 6 HOURS AS NEEDED FOR  MODERATE  PAIN, Disp: 120 tablet, Rfl: 5 .  zinc gluconate 50 MG tablet, Take 50 mg by mouth daily., Disp: , Rfl:   EXAM:  VITALS per patient if applicable:  GENERAL: alert, oriented, appears well and in no acute distress  HEENT: atraumatic, conjunttiva clear, no obvious abnormalities on inspection of external nose and ears  NECK: normal movements of the head and neck  LUNGS: on inspection no signs of respiratory distress, breathing rate appears normal, no obvious gross SOB, gasping or wheezing  CV: no obvious cyanosis  MS: moves all visible extremities without noticeable abnormality  PSYCH/NEURO: pleasant and cooperative, no obvious depression or anxiety, speech and thought processing grossly intact  ASSESSMENT AND  PLAN:  Discussed the following  assessment and plan:  COVID 19 infection.  She is on day 12.  Past window for monoclonal antibodies.  Fortunately, her symptoms are relatively mild other than persistent cough.  No dyspnea.  No relief with over-the-counter medications. She has history of listed intolerance with codeine with nausea but has apparently tolerated low doses of cough medicine in the past. -Wrote for Hycodan cough syrup 1/2 to 1 teaspoon every 6 hours for severe cough. -Follow-up for any fever, persistent cough, or worsening symptoms     I discussed the assessment and treatment plan with the patient. The patient was provided an opportunity to ask questions and all were answered. The patient agreed with the plan and demonstrated an understanding of the instructions.   The patient was advised to call back or seek an in-person evaluation if the symptoms worsen or if the condition fails to improve as anticipated.     Carolann Littler, MD

## 2020-11-21 ENCOUNTER — Other Ambulatory Visit: Payer: Self-pay | Admitting: Family Medicine

## 2020-11-24 ENCOUNTER — Other Ambulatory Visit: Payer: Self-pay | Admitting: Family Medicine

## 2020-11-29 ENCOUNTER — Other Ambulatory Visit: Payer: Self-pay

## 2020-11-30 ENCOUNTER — Encounter: Payer: Self-pay | Admitting: Family Medicine

## 2020-11-30 ENCOUNTER — Ambulatory Visit (INDEPENDENT_AMBULATORY_CARE_PROVIDER_SITE_OTHER): Payer: PPO | Admitting: Family Medicine

## 2020-11-30 VITALS — BP 126/80 | HR 78 | Temp 98.8°F | Ht 68.0 in | Wt 190.6 lb

## 2020-11-30 DIAGNOSIS — E039 Hypothyroidism, unspecified: Secondary | ICD-10-CM | POA: Diagnosis not present

## 2020-11-30 DIAGNOSIS — R739 Hyperglycemia, unspecified: Secondary | ICD-10-CM | POA: Diagnosis not present

## 2020-11-30 DIAGNOSIS — Z0000 Encounter for general adult medical examination without abnormal findings: Secondary | ICD-10-CM | POA: Diagnosis not present

## 2020-11-30 LAB — URINALYSIS, ROUTINE W REFLEX MICROSCOPIC
Bilirubin Urine: NEGATIVE
Hgb urine dipstick: NEGATIVE
Ketones, ur: NEGATIVE
Nitrite: POSITIVE — AB
RBC / HPF: NONE SEEN (ref 0–?)
Specific Gravity, Urine: 1.025 (ref 1.000–1.030)
Total Protein, Urine: NEGATIVE
Urine Glucose: NEGATIVE
Urobilinogen, UA: 0.2 (ref 0.0–1.0)
pH: 6 (ref 5.0–8.0)

## 2020-11-30 LAB — CBC WITH DIFFERENTIAL/PLATELET
Basophils Absolute: 0 10*3/uL (ref 0.0–0.1)
Basophils Relative: 0.5 % (ref 0.0–3.0)
Eosinophils Absolute: 0.3 10*3/uL (ref 0.0–0.7)
Eosinophils Relative: 3.6 % (ref 0.0–5.0)
HCT: 46.7 % — ABNORMAL HIGH (ref 36.0–46.0)
Hemoglobin: 16.2 g/dL — ABNORMAL HIGH (ref 12.0–15.0)
Lymphocytes Relative: 34.7 % (ref 12.0–46.0)
Lymphs Abs: 2.8 10*3/uL (ref 0.7–4.0)
MCHC: 34.6 g/dL (ref 30.0–36.0)
MCV: 90.1 fl (ref 78.0–100.0)
Monocytes Absolute: 1 10*3/uL (ref 0.1–1.0)
Monocytes Relative: 12.8 % — ABNORMAL HIGH (ref 3.0–12.0)
Neutro Abs: 3.9 10*3/uL (ref 1.4–7.7)
Neutrophils Relative %: 48.4 % (ref 43.0–77.0)
Platelets: 230 10*3/uL (ref 150.0–400.0)
RBC: 5.19 Mil/uL — ABNORMAL HIGH (ref 3.87–5.11)
RDW: 13.3 % (ref 11.5–15.5)
WBC: 8 10*3/uL (ref 4.0–10.5)

## 2020-11-30 LAB — LIPID PANEL
Cholesterol: 223 mg/dL — ABNORMAL HIGH (ref 0–200)
HDL: 44.2 mg/dL (ref >39.00)
NonHDL: 179.26
Total CHOL/HDL Ratio: 5
Triglycerides: 314 mg/dL — ABNORMAL HIGH (ref 0.0–149.0)
VLDL: 62.8 mg/dL — ABNORMAL HIGH (ref 0.0–40.0)

## 2020-11-30 LAB — BASIC METABOLIC PANEL
BUN: 18 mg/dL (ref 6–23)
CO2: 32 mEq/L (ref 19–32)
Calcium: 9.9 mg/dL (ref 8.4–10.5)
Chloride: 100 mEq/L (ref 96–112)
Creatinine, Ser: 0.69 mg/dL (ref 0.40–1.20)
GFR: 89.08 mL/min (ref >60.00)
Glucose, Bld: 117 mg/dL — ABNORMAL HIGH (ref 70–99)
Potassium: 3.8 mEq/L (ref 3.5–5.1)
Sodium: 139 mEq/L (ref 135–145)

## 2020-11-30 LAB — HEPATIC FUNCTION PANEL
ALT: 25 U/L (ref 0–35)
AST: 25 U/L (ref 0–37)
Albumin: 4.2 g/dL (ref 3.5–5.2)
Alkaline Phosphatase: 66 U/L (ref 39–117)
Bilirubin, Direct: 0.1 mg/dL (ref 0.0–0.3)
Total Bilirubin: 0.4 mg/dL (ref 0.2–1.2)
Total Protein: 6.9 g/dL (ref 6.0–8.3)

## 2020-11-30 LAB — T3, FREE: T3, Free: 4.6 pg/mL — ABNORMAL HIGH (ref 2.3–4.2)

## 2020-11-30 LAB — LDL CHOLESTEROL, DIRECT: Direct LDL: 150 mg/dL

## 2020-11-30 LAB — TSH: TSH: 3.86 u[IU]/mL (ref 0.35–4.50)

## 2020-11-30 LAB — HEMOGLOBIN A1C: Hgb A1c MFr Bld: 6.5 % (ref 4.6–6.5)

## 2020-11-30 LAB — T4, FREE: Free T4: 0.78 ng/dL (ref 0.60–1.60)

## 2020-11-30 MED ORDER — BENZONATATE 200 MG PO CAPS: 200.0000 mg | ORAL_CAPSULE | Freq: Four times a day (QID) | ORAL | 2 refills | Status: DC | PRN

## 2020-11-30 MED ORDER — EZETIMIBE 10 MG PO TABS: 10.0000 mg | ORAL_TABLET | Freq: Every day | ORAL | 3 refills | Status: DC

## 2020-11-30 MED ORDER — OMEPRAZOLE 20 MG PO CPDR: 20.0000 mg | DELAYED_RELEASE_CAPSULE | Freq: Every day | ORAL | 3 refills | Status: DC

## 2020-11-30 MED ORDER — PROPRANOLOL HCL 40 MG PO TABS: 40.0000 mg | ORAL_TABLET | Freq: Every day | ORAL | 3 refills | Status: DC

## 2020-11-30 MED ORDER — HYDROCODONE-HOMATROPINE 5-1.5 MG/5ML PO SYRP: 5.0000 mL | ORAL_SOLUTION | ORAL | 0 refills | Status: DC | PRN

## 2020-11-30 MED ORDER — PREDNISONE 5 MG PO TABS: 5.0000 mg | ORAL_TABLET | Freq: Every day | ORAL | 5 refills | Status: DC

## 2020-11-30 NOTE — Progress Notes (Signed)
   Subjective:    Patient ID: Victoria Mosley, female    DOB: [birth year], 70 y.o.   MRN: [id number]  HPI Here for a well exam. She feels fine except she still has a dry lingering cough for a recent Covid infection. She tested positive on 11-07-20, and she and her husband had it together. She has totally recovered at this point except for the cough.    Review of Systems  Constitutional: Negative.   HENT: Negative.   Eyes: Negative.   Respiratory: Positive for cough.   Cardiovascular: Negative.   Gastrointestinal: Negative.   Genitourinary: Negative for decreased urine volume, difficulty urinating, dyspareunia, dysuria, enuresis, flank pain, frequency, hematuria, pelvic pain and urgency.  Musculoskeletal: Negative.   Skin: Negative.   Neurological: Negative.   Psychiatric/Behavioral: Negative.        Objective:   Physical Exam Constitutional:      General: She is not in acute distress.    Appearance: She is well-developed and well-nourished.  HENT:     Head: Normocephalic and atraumatic.     Right Ear: External ear normal.     Left Ear: External ear normal.     Nose: Nose normal.     Mouth/Throat:     Mouth: Oropharynx is clear and moist.     Pharynx: No oropharyngeal exudate.  Eyes:     General: No scleral icterus.    Extraocular Movements: EOM normal.     Conjunctiva/sclera: Conjunctivae normal.     Pupils: Pupils are equal, round, and reactive to light.  Neck:     Thyroid: No thyromegaly.     Vascular: No JVD.  Cardiovascular:     Rate and Rhythm: Normal rate and regular rhythm.     Pulses: Intact distal pulses.     Heart sounds: Normal heart sounds. No murmur heard. No friction rub. No gallop.   Pulmonary:     Effort: Pulmonary effort is normal. No respiratory distress.     Breath sounds: Normal breath sounds. No wheezing or rales.  Chest:     Chest wall: No tenderness.  Abdominal:     General: Bowel sounds are normal. There is no distension.     Palpations:  Abdomen is soft. There is no mass.     Tenderness: There is no abdominal tenderness. There is no guarding or rebound.  Musculoskeletal:        General: No tenderness or edema. Normal range of motion.     Cervical back: Normal range of motion and neck supple.  Lymphadenopathy:     Cervical: No cervical adenopathy.  Skin:    General: Skin is warm and dry.     Findings: No erythema or rash.  Neurological:     Mental Status: She is alert and oriented to person, place, and time.     Cranial Nerves: No cranial nerve deficit.     Motor: No abnormal muscle tone.     Coordination: Coordination normal.     Deep Tendon Reflexes: Reflexes are normal and symmetric. Reflexes normal.  Psychiatric:        Mood and Affect: Mood and affect normal.        Behavior: Behavior normal.        Thought Content: Thought content normal.        Judgment: Judgment normal.           Assessment & Plan:  Well exam. We discussed diet and exercise. Get fasting labs.  Alysia Penna, MD

## 2020-12-02 NOTE — Progress Notes (Signed)
Mychart message sent: Normal except mildly elevated lipids. Watch the diet closely

## 2020-12-09 DIAGNOSIS — L821 Other seborrheic keratosis: Secondary | ICD-10-CM | POA: Diagnosis not present

## 2020-12-09 DIAGNOSIS — B078 Other viral warts: Secondary | ICD-10-CM | POA: Diagnosis not present

## 2020-12-09 DIAGNOSIS — H5203 Hypermetropia, bilateral: Secondary | ICD-10-CM | POA: Diagnosis not present

## 2020-12-09 DIAGNOSIS — L298 Other pruritus: Secondary | ICD-10-CM | POA: Diagnosis not present

## 2020-12-09 DIAGNOSIS — L218 Other seborrheic dermatitis: Secondary | ICD-10-CM | POA: Diagnosis not present

## 2020-12-09 DIAGNOSIS — H524 Presbyopia: Secondary | ICD-10-CM | POA: Diagnosis not present

## 2020-12-09 DIAGNOSIS — H43811 Vitreous degeneration, right eye: Secondary | ICD-10-CM | POA: Diagnosis not present

## 2020-12-09 DIAGNOSIS — H2513 Age-related nuclear cataract, bilateral: Secondary | ICD-10-CM | POA: Diagnosis not present

## 2020-12-09 DIAGNOSIS — D229 Melanocytic nevi, unspecified: Secondary | ICD-10-CM | POA: Diagnosis not present

## 2020-12-09 DIAGNOSIS — L814 Other melanin hyperpigmentation: Secondary | ICD-10-CM | POA: Diagnosis not present

## 2020-12-09 DIAGNOSIS — L570 Actinic keratosis: Secondary | ICD-10-CM | POA: Diagnosis not present

## 2020-12-09 DIAGNOSIS — L819 Disorder of pigmentation, unspecified: Secondary | ICD-10-CM | POA: Diagnosis not present

## 2020-12-14 ENCOUNTER — Other Ambulatory Visit: Payer: Self-pay | Admitting: Family Medicine

## 2020-12-14 NOTE — Telephone Encounter (Signed)
Last office visit- 11/30/2020 Last refill- 11/24/2019--30 tabs with 5 refills  No future appointments scheduled

## 2020-12-21 DIAGNOSIS — Z20822 Contact with and (suspected) exposure to covid-19: Secondary | ICD-10-CM | POA: Diagnosis not present

## 2020-12-21 DIAGNOSIS — Z03818 Encounter for observation for suspected exposure to other biological agents ruled out: Secondary | ICD-10-CM | POA: Diagnosis not present

## 2021-01-24 DIAGNOSIS — N200 Calculus of kidney: Secondary | ICD-10-CM | POA: Diagnosis not present

## 2021-01-24 DIAGNOSIS — N3020 Other chronic cystitis without hematuria: Secondary | ICD-10-CM | POA: Diagnosis not present

## 2021-01-24 DIAGNOSIS — R350 Frequency of micturition: Secondary | ICD-10-CM | POA: Diagnosis not present

## 2021-02-23 ENCOUNTER — Other Ambulatory Visit: Payer: Self-pay | Admitting: Family Medicine

## 2021-02-24 DIAGNOSIS — Z1231 Encounter for screening mammogram for malignant neoplasm of breast: Secondary | ICD-10-CM | POA: Diagnosis not present

## 2021-02-24 LAB — HM MAMMOGRAPHY

## 2021-02-28 ENCOUNTER — Telehealth: Payer: Self-pay | Admitting: Pharmacist

## 2021-02-28 NOTE — Chronic Care Management (AMB) (Addendum)
02/28/2021- Called patient to reminder her of appointment with Jeni Salles, CPP on 02/28/2021 @ 9 am. Patient states she did not know she had an appt tomorrow at 9am, she looked at her calendar and she does see an appointment down with the pharmacist but thought it was a different time. She will not be available to speak tomorrow morning. Patient would like to reschedule, moved appointment out to April 20, 2021 @ 11:00 am, patient states her and her husband will be traveling over the next month and going on a cruise. Patient would like a text message of appointment. Message sent, also change of appointment request change  sent to Calton Golds, [place] scheduler. Jeni Salles, CPP notified.  Pattricia Boss, [place]

## 2021-03-01 ENCOUNTER — Telehealth: Payer: PPO

## 2021-03-11 DIAGNOSIS — M19072 Primary osteoarthritis, left ankle and foot: Secondary | ICD-10-CM | POA: Diagnosis not present

## 2021-03-11 DIAGNOSIS — M19079 Primary osteoarthritis, unspecified ankle and foot: Secondary | ICD-10-CM | POA: Diagnosis not present

## 2021-03-15 ENCOUNTER — Encounter: Payer: Self-pay | Admitting: Family Medicine

## 2021-04-19 ENCOUNTER — Telehealth: Payer: Self-pay | Admitting: Pharmacist

## 2021-04-19 NOTE — Chronic Care Management (AMB) (Signed)
Date- Patient called to remind of appointment with Watt Climes on 06.22.2022 9:00 am.  Patient aware of appointment date, time, and type of appointment (either telephone or in person). Patient aware to have/bring all medications, supplements, blood pressure and/or blood sugar logs to visit.  Questions: Have you had any recent office visit or specialist visit outside of [place]? No Are there any concerns you would like to discuss during your office visit? No Are you having any problems obtaining your medications? (Whether it pharmacy issues or cost) No  Star Rating Drug: none  Any gaps in medications fill history?    Maia Breslow, [place] Pharmacist Assistant [phone]

## 2021-04-20 ENCOUNTER — Ambulatory Visit (INDEPENDENT_AMBULATORY_CARE_PROVIDER_SITE_OTHER): Payer: PPO | Admitting: Pharmacist

## 2021-04-20 DIAGNOSIS — E785 Hyperlipidemia, unspecified: Secondary | ICD-10-CM | POA: Diagnosis not present

## 2021-04-20 DIAGNOSIS — K219 Gastro-esophageal reflux disease without esophagitis: Secondary | ICD-10-CM

## 2021-04-20 NOTE — Progress Notes (Signed)
Chronic Care Management Pharmacy Note  04/22/2021 Name:  Victoria Mosley MRN:  [id number] DOB:  1951-04-04  Summary: LDL above goal of < 100  Recommendations/Changes made from today's visit: -Discussed consideration of adding PCSK9i for further LDL lowering and patient preferred to hold off for now  Plan: Follow up for HLD assessment in 6 months   Subjective: Victoria Mosley is an 70 y.o. year old female who is a primary patient of Laurey Morale, MD.  The CCM team was consulted for assistance with disease management and care coordination needs.    Engaged with patient by telephone for follow up visit in response to provider referral for pharmacy case management and/or care coordination services.   Consent to Services:  The patient was given information about Chronic Care Management services, agreed to services, and gave verbal consent prior to initiation of services.  Please see initial visit note for detailed documentation.   Patient Care Team: Laurey Morale, MD as PCP - General Louis Meckel Viona Gilmore, MD as Attending Physician (Urology) Phineas Real, Belinda Block, MD (Inactive) as Consulting Physician (Gynecology) Irene Shipper, MD as Consulting Physician (Gastroenterology) [name], MD as Consulting Physician (Orthopedic Surgery) Gaynelle Arabian, MD as Consulting Physician (Orthopedic Surgery) Marijean Niemann (Dermatology) Viona Gilmore, [facility] as Pharmacist (Pharmacist)  Recent office visits: 11/30/20 Alysia Penna, MD: Patient presented for annual exam.   11/19/20 Carolann Littler, MD: Patient presented for video visit due to COVID infection and cough. Prescribed Hydrocan.  Recent consult visits: 03/11/21 Ericka Pontiff, PT ([facility]): Unable to access notes.  01/24/21 Louis Meckel, MD (urology): Unable to access notes.  12/09/20 Katy Apo (ophthalmology): Unable to access notes.  12/09/20 Cindie Crumbly (dermatology): Unable to access notes.    Hospital  visits: None in previous 6 months   Objective:  Lab Results  Component Value Date   CREATININE 0.69 11/30/2020   BUN 18 11/30/2020   GFR 89.08 11/30/2020   GFRNONAA >60 02/13/2017   GFRAA >60 02/13/2017   NA 139 11/30/2020   K 3.8 11/30/2020   CALCIUM 9.9 11/30/2020   CO2 32 11/30/2020   GLUCOSE 117 (H) 11/30/2020    Lab Results  Component Value Date/Time   HGBA1C 6.5 11/30/2020 08:42 AM   HGBA1C 6.5 11/28/2019 09:00 AM   GFR 89.08 11/30/2020 08:42 AM   GFR 71.42 11/28/2019 09:00 AM   MICROALBUR neg 08/15/2018 12:00 AM    Last diabetic Eye exam: No results found for: HMDIABEYEEXA  Last diabetic Foot exam: No results found for: HMDIABFOOTEX   Lab Results  Component Value Date   CHOL 223 (H) 11/30/2020   HDL 44.20 11/30/2020   LDLCALC 115 (H) 11/19/2017   LDLDIRECT 150.0 11/30/2020   TRIG 314.0 (H) 11/30/2020   CHOLHDL 5 11/30/2020    Hepatic Function Latest Ref Rng & Units 11/30/2020 11/28/2019 11/18/2018  Total Protein 6.0 - 8.3 g/dL 6.9 6.7 7.0  Albumin 3.5 - 5.2 g/dL 4.2 4.1 4.1  AST 0 - 37 U/L _0 ALT 0 - 35 U/L _1 Alk Phosphatase 39 - 117 U/L 66 53 67  Total Bilirubin 0.2 - 1.2 mg/dL 0.4 0.4 0.5  Bilirubin, Direct 0.0 - 0.3 mg/dL 0.1 0.1 0.1    Lab Results  Component Value Date/Time   TSH 3.86 11/30/2020 08:42 AM   TSH 5.16 (H) 11/28/2019 09:00 AM   FREET4 0.78 11/30/2020 08:42 AM   FREET4 0.83 11/28/2019 09:00 AM    CBC  Latest Ref Rng & Units 11/30/2020 11/28/2019 11/18/2018  WBC 4.0 - 10.5 K/uL 8.0 6.7 9.5  Hemoglobin 12.0 - 15.0 g/dL 16.2(H) 15.4(H) 15.8(H)  Hematocrit 36.0 - 46.0 % 46.7(H) 44.7 45.8  Platelets 150.0 - 400.0 K/uL 230.0 197.0 238.0    No results found for: VD25OH  Clinical ASCVD: No  The 10-year ASCVD risk score Mikey Bussing DC Jr., et al., 2013) is: 11%   Values used to calculate the score:     Age: 23 years     Sex: Female     Is Non-Hispanic African American: No     Diabetic: No     Tobacco smoker: No     Systolic  Blood Pressure: 126 mmHg     Is BP treated: Yes     HDL Cholesterol: 44.2 mg/dL     Total Cholesterol: 223 mg/dL    Depression screen [facility] 2/9 09/06/2020 11/12/2019 08/26/2019  Decreased Interest 0 0 0  Down, Depressed, Hopeless 0 1 0  PHQ - 2 Score 0 1 0  Altered sleeping 0 - -  Tired, decreased energy 0 - -  Change in appetite 0 - -  Feeling bad or failure about yourself  0 - -  Trouble concentrating 0 - -  Moving slowly or fidgety/restless 0 - -  Suicidal thoughts 0 - -  PHQ-9 Score 0 - -  Difficult doing work/chores Not difficult at all - -  Some recent data might be hidden     Social History   Tobacco Use  Smoking Status Former   Packs/day: 0.25   Pack years: 0.00   Types: Cigarettes   Quit date: 12/20/1998   Years since quitting: 22.3  Smokeless Tobacco Never   BP Readings from Last 3 Encounters:  11/30/20 126/80  05/12/20 122/76  11/24/19 120/62   Pulse Readings from Last 3 Encounters:  11/30/20 78  11/24/19 71  11/12/19 73   Wt Readings from Last 3 Encounters:  11/30/20 190 lb 9.6 oz (86.5 kg)  05/12/20 194 lb (88 kg)  11/24/19 200 lb 6.4 oz (90.9 kg)   BMI Readings from Last 3 Encounters:  11/30/20 28.98 kg/m  05/12/20 29.50 kg/m  11/24/19 30.47 kg/m    Assessment/Interventions: Review of patient past medical history, allergies, medications, health status, including review of consultants reports, laboratory and other test data, was performed as part of comprehensive evaluation and provision of chronic care management services.   SDOH:  (Social Determinants of Health) assessments and interventions performed: No  SDOH Screenings   Alcohol Screen: Low Risk    Last Alcohol Screening Score (AUDIT): 1  Depression (PHQ2-9): Low Risk    PHQ-2 Score: 0  Financial Resource Strain: Low Risk    Difficulty of Paying Living Expenses: Not hard at all  Food Insecurity: No Food Insecurity   Worried About Charity fundraiser in the Last Year: Never true   Ran  Out of Food in the Last Year: Never true  Housing: Low Risk    Last Housing Risk Score: 0  Physical Activity: Inactive   Days of Exercise per Week: 0 days   Minutes of Exercise per Session: 0 min  Social Connections: Moderately Integrated   Frequency of Communication with Friends and Family: More than three times a week   Frequency of Social Gatherings with Friends and Family: Twice a week   Attends Religious Services: More than 4 times per year   Active Member of [employer] or Organizations: No   Attends [employer]  or Organization Meetings: Never   Marital Status: Married  Stress: No Stress Concern Present   Feeling of Stress : Not at all  Tobacco Use: Medium Risk   Smoking Tobacco Use: Former   Smokeless Tobacco Use: Never  Transportation Needs: Not on file    CCM Care Plan  Allergies  Allergen Reactions   Levaquin [Levofloxacin In D5w] Anaphylaxis, Rash and Other (See Comments)    Hot, dizziness, diarrhea. Tolerated Ciprofloxacin 12/2017   Pravastatin Sodium [Pravachol]     Joint pain(severe)   Septra [Sulfamethoxazole-Trimethoprim] Anaphylaxis    diarrhea   Biaxin [Clarithromycin] Nausea And Vomiting   Celebrex [Celecoxib]     Severe GI upset   Ciprofloxacin Nausea Only    Severe GI upset   Clarithromycin Nausea Only   Codeine Nausea And Vomiting   Dust Mite Extract Other (See Comments)      Grass, trees also- cause Runny nose, sneezing (allergy symptoms)   Keflex [Cephalexin] Itching    Medications Reviewed Today     Reviewed by Laurey Morale, MD (Physician) on 11/30/20 at [place] List Status: <None>   Medication Order Taking? Sig Documenting Provider Last Dose Status Informant  AMOXICILLIN PO [id number] No Take 500 mg by mouth. Takes 2097m prior to dental procedures only  Patient not taking: Reported on 11/30/2020   [provider] Not Taking Active   Ascorbic Acid (VITAMIN C) 1000 MG tablet [id number] Take 1,000 mg by mouth daily.  Patient not taking:  Reported on 11/30/2020   [provider] Not Taking Active Self  aspirin-acetaminophen-caffeine ([facility] DrMIGRAINE) 2806-310-8554MG tablet [id number] Take by mouth.  Patient not taking: Reported on 11/30/2020   [provider] Not Taking Active   b complex vitamins tablet [id number] Take 1 tablet by mouth daily. [provider] Taking Active Self  betamethasone valerate lotion (VALISONE) 0.1 % [id number] Apply topically as needed. [provider] Taking Active   CALCIUM CITRATE PO [id number] Take 1,000 mg by mouth every evening. [provider] Taking Active Self  Cranberry 250 MG CAPS [id number] Take 500 mg by mouth 2 (two) times daily. [provider] Taking Active Self  diphenhydrAMINE (BENADRYL) 25 mg capsule [id number] Take 25 mg by mouth at bedtime. [provider] Taking Active Self  DOCUSATE SODIUM PO [id number] Take 100 mg by mouth at bedtime. Takes Monday, Wednesday, Friday, Sunday [provider] Taking Active   estradiol (ESTRACE) 0.5 MG tablet [id number] Take 1 tablet (0.5 mg total) by mouth daily. KJoseph Pierini MD Taking Active   ezetimibe (ZETIA) 10 MG tablet [id number] Take 1 tablet (10 mg total) by mouth daily. FLaurey Morale MD Taking Active   FIORICET 50-300-40 MG CAPS [id number] TAKE ONE CAPSULE TWICE A DAY AS NEEDED FOR HEADACHE FLaurey Morale MD Taking Active   hydrochlorothiazide (HYDRODIURIL) 25 MG tablet [id number] Take 1 tablet by mouth once daily FLaurey Morale MD Taking Active   LACTOBACILLUS PO [id number] Take 1 tablet by mouth daily. [provider] Taking Active Self  levothyroxine (SYNTHROID) 75 MCG tablet [id number] Take 1 tablet by mouth once daily FLaurey Morale MD Taking Active   LEVSIN 0.125 MG tablet [id number] Take 0.125 mg by mouth as needed. [provider] Taking Active Self  loratadine (CLARITIN) 10 MG tablet [id number]  Take 10 mg by mouth daily. [provider] Taking Active   methenamine (HIPREX) 1 g  tablet [id number] Yes Take 1 g by mouth 2 (two) times daily with a meal. [provider] Taking Active   Multiple Vitamins-Minerals (WOMENS 50+ MULTI VITAMIN/MIN PO) [id number] Yes Take 1 tablet by mouth daily. [provider] Taking Active Self  naproxen sodium (ANAPROX) 220 MG tablet [id number] Yes Take 220 mg by mouth daily.  [provider] Taking Active Self  omeprazole (PRILOSEC) 20 MG capsule [id number] Yes Take 1 capsule (20 mg total) by mouth daily. Laurey Morale, MD Taking Active   predniSONE (DELTASONE) 5 MG tablet [id number] Yes Take 1 tablet (5 mg total) by mouth daily with breakfast. Laurey Morale, MD Taking Active   prochlorperazine (COMPAZINE) 10 MG tablet [id number] Yes Take 1 tablet (10 mg total) by mouth every 6 (six) hours as needed for nausea or vomiting. Laurey Morale, MD Taking Active   progesterone [facility]) 100 MG capsule [id number] Yes Take 1 capsule (100 mg total) by mouth daily. Joseph Pierini, MD Taking Active   propranolol (INDERAL) 40 MG tablet [id number] Yes Take 1 tablet (40 mg total) by mouth daily. Laurey Morale, MD Taking Active   pyridoxine (B-6) 200 MG tablet [id number] Yes Take 200 mg by mouth daily. [provider] Taking Active   TOBRAMYCIN OP [id number] Yes 80 mg. EMPTY 1 CAPSULE IN SINUS RINSE BOTTLE AND USE TWICE DAILY [provider] Taking Active   traMADol (ULTRAM) 50 MG tablet [id number] Yes TAKE 2 TABLETS BY MOUTH EVERY 6 HOURS AS NEEDED FOR  MODERATE  PAIN Laurey Morale, MD Taking Active   zinc gluconate 50 MG tablet [id number] Yes Take 50 mg by mouth daily. [provider] Taking Active             Patient Active Problem List   Diagnosis Date Noted   Pain in left foot 09/06/2020   Menopause    Chronic UTI (urinary tract infection)    Impingement syndrome of left shoulder region 01/06/2020    Hypothyroidism 11/24/2019   Acquired trigger finger of left middle finger 05/01/2019   Trigger thumb of left hand 05/01/2019   Trigger finger 11/25/2018   Tibialis posterior tendinitis 10/10/2018   Arthritis of hand 07/19/2018   Carpal tunnel syndrome on both sides 06/30/2018   Kidney stone 03/11/2018   Migraine 03/11/2018   Left ankle pain 01/02/2018   Chronic maxillary sinusitis 09/28/2017   OA (osteoarthritis) of hip 09/27/2016   Dizziness 04/17/2016   S/P total knee arthroplasty 12/19/2014   Intractable back pain 12/19/2014   Sciatica of left side 12/19/2014   Migraines 12/19/2014   DDD (degenerative disc disease), lumbar 12/19/2014   Left-sided low back pain with left-sided sciatica    Status post total right knee replacement    OA (osteoarthritis) of knee 12/07/2014   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   Spastic neurogenic bladder 08/06/2012   Migraine with aura, intractable 08/06/2012   Overactive bladder 07/31/2012   Epistaxis 07/12/2012   IBS (irritable bowel syndrome) 07/26/2011   Osteoarthritis 01/26/2010   NEPHROLITHIASIS, HX OF 01/26/2010   DIVERTICULITIS, COLON 11/02/2008   Hyperlipidemia 02/25/2008   Headache 10/17/2007   Allergic rhinitis 06/19/2007   GERD 06/19/2007   Melanoma in situ of right upper arm ([place]) 01/28/2006    Immunization History  Administered Date(s) Administered   Fluad Quad(high Dose 65+) 08/25/2019, 08/26/2020   Influenza Split 07/11/2012   Influenza Whole 08/19/2010   Influenza, High Dose Seasonal PF 07/26/2017, 08/15/2018   Influenza,inj,quad, With  Preservative 07/26/2017   Influenza-Unspecified 07/28/2015, 07/26/2016   PFIZER(Purple Top)SARS-COV-2 Vaccination 11/24/2019, 12/15/2019, 07/27/2020, 02/28/2021   Pneumococcal Conjugate-13 07/29/2015   Pneumococcal Polysaccharide-23 05/28/2012, 08/15/2018   Td 10/30/2001   Tdap 05/28/2012   Zoster, Live 05/28/2012    Conditions to be  addressed/monitored:  Hyperlipidemia, GERD, Hypothyroidism, Osteoarthritis, Allergic Rhinitis, and Migraines, Menopausal symptoms  Care Plan : [place]  Updates made by Viona Gilmore, [place] since 04/22/2021 12:00 AM     Problem: Problem: Hyperlipidemia, GERD, Hypothyroidism, Osteoarthritis, Allergic Rhinitis, and Migraines, Menopausal symptoms      Long-Range Goal: Patient-Specific Goal   Start Date: 04/20/2021  Expected End Date: 04/20/2022  This Visit's Progress: On track  Priority: High  Note:   Current Barriers:  Unable to independently monitor therapeutic efficacy Unable to achieve control of cholesterol   Pharmacist Clinical Goal(s):  Patient will achieve adherence to monitoring guidelines and medication adherence to achieve therapeutic efficacy achieve control of cholesterol as evidenced by next lipid panel  through collaboration with PharmD and provider.   Interventions: 1:1 collaboration with Laurey Morale, MD regarding development and update of comprehensive plan of care as evidenced by provider attestation and co-signature Inter-disciplinary care team collaboration (see longitudinal plan of care) Comprehensive medication review performed; medication list updated in electronic medical record  Hyperlipidemia: (LDL goal < 100) -Uncontrolled -Current treatment: Zetia 10 mg 1 tablet daily -Medications previously tried: pravastatin  -Current dietary patterns: eating less overall; stopping when she is full and has lost weight; little meat and more vegetables and salads; uses olive oil -Current exercise habits: did not discuss -Educated on Cholesterol goals;  Importance of limiting foods high in cholesterol; Exercise goal of 150 minutes per week; -Counseled on diet and exercise extensively Recommended to continue current medication Counseled on benefits of PCSK9 inhibitor and patient preferred to hold off for now.  Migraines (Goal: minimize  symptoms) -Controlled -Current treatment  Propranolol 40mg , 1 tablet once daily Fioricet 50-300-40mg , 1 capsule twice daily as needed for headache (some days does not take at all) Excedrin Migraine 250-250-65mg , 1 tablet as needed  (will take if catching migraine early) - very seldom -Medications previously tried: none  -Recommended to continue current medication  Hypothyroidism (Goal: TSH 0.35-4.5) -Controlled -Current treatment  Levothyroxine 58mcg, 1 tablet once daily  -Medications previously tried: none  -Recommended to continue current medication  Osteopenia (Goal prevent fractures) -Controlled -Last DEXA Scan: 02/2020   T-Score femoral neck: n/a  T-Score total hip: n/a  T-Score lumbar spine: -2.2  T-Score forearm radius: -1.1  10-year probability of major osteoporotic fracture: n/a  10-year probability of hip fracture: n/a -Patient is not a candidate for pharmacologic treatment -Current treatment  calcium citrate 1000mg , 1 tablet every evening  Multivitamin (vitamin D 1000 units) daily -Medications previously tried: none  -Recommend [phone] units of vitamin D daily. Recommend 1200 mg of calcium daily from dietary and supplemental sources. Recommend weight-bearing and muscle strengthening exercises for building and maintaining bone density. -Counseled on diet and exercise extensively Recommended to continue current medication  GERD (Goal: minimize symptoms) -Controlled -Current treatment  Omeprazole 20mg , 1 capsule once daily -Medications previously tried: none  -Recommended to continue current medication  Allergic rhinitis (Goal: minimize symptoms) -Not ideally controlled -Current treatment  diphenhydramine 25mg , 1 capsule at bedtime Cetirizine 10mg , 1 tablet in the morning -Medications previously tried: none  -Counseled on risks of taking diphenhydramine in older adults  Chronic sinusitis (Goal: minimize symptoms) -Controlled -Current treatment  tobramycin  80mg ,  in sinus rinse bottle and use twice daily - uses once daily -Medications previously tried: none  -Recommended to continue current medication  Osteoarthritis (Goal: minimize symptoms of pain) -Controlled -Current treatment  Aleve 287m every morning tramadol 513m 2 tablets every six hours as needed for moderate pain (takes 2-3 times per week) vitamin B-6 2001m1 tablet once daily Biofreeze on back and ankles Voltaren gel as needed -Medications previously tried: none  -Recommended to continue current medication  Menopausal symptoms (Goal: minimize symptoms) -Not ideally controlled -Current treatment  Estradiol 0.5mg44m tablet once daily Progesterone 100mg78m capsule at bedtime -Medications previously tried: none  -Counseled on risks of long term hormone therapy and patient is aware and does not wish to stop hormone therapy.  History of kidney stones (Goal: prevent kidney stone formation) -Controlled -Current treatment  lactobacillus (probiotic), 1 capsule once daily -Medications previously tried: HCTZ (frequent urination)  -Recommended decreasing vitamin C supplementation to 500 mg daily Educated on risk of recurrent kidney stones with vitamin C supplementation  Recurrent UTIs (Goal: prevent UTIs) -Controlled -Current treatment  methenamine 1 gram, 1 tablet twice daily cranberry 500mg,63mapsule twice daily -Medications previously tried: none  -Recommended to continue current medication   Health Maintenance -Vaccine gaps: shingrix, COVID booster -Current therapy:  vitamin B complex-  Patient planning to hold and will see how she feels before restarting docusate 100mg- 80mblet M,W,F,Sunday prochlorperazine 10mg, 17mlet every six hours as needed for nausea/ vomiting betamethasone valerate 0.1% lotion- uses as needed hyoscyamine 0.125mg, 1 66met as needed potassium gluconate 595mg, 1 t21mt once daily -Educated on Cost vs benefit of each product must be  carefully weighed by individual consumer -Patient is satisfied with current therapy and denies issues -Recommended to continue current medication  Patient Goals/Self-Care Activities Patient will:  - take medications as prescribed target a minimum of 150 minutes of moderate intensity exercise weekly  Follow Up Plan: Telephone follow up appointment with care management team member scheduled for: 1 year      Medication Assistance: None required.  Patient affirms current coverage meets needs.  Compliance/Adherence/Medication fill history: Care Gaps: shingrix  Star-Rating Drugs: None  [facility] [id number] - [id number],[place] [phone] box? Yes - one month at a time Pt endorses 100% compliance  We discussed: Current pharmacy is preferred with insurance plan and patient is satisfied with pharmacy services Patient decided to: Continue current medication management strategy  Care Plan and Follow Up Patient Decision:  Patient agrees to Care Plan and Follow-up.  Plan: Telephone follow up appointment with care management team member scheduled for:  1 year  Krimson Massmann PrJeni SallesCACP CliniNances [phone]

## 2021-04-22 NOTE — Patient Instructions (Signed)
Hi Victoria Mosley,  It was great to get to speak with you again! Keep considering the other cholesterol therapy we discussed as I think it would be really beneficial to lower your cholesterol. Also, as a reminder we also discussed limiting your extra vitamin C intake as that can increase you risk for kidney stones.   Please reach out to me if you have any questions or need anything before our follow up!  Best, Maddie  Jeni Salles, PharmD, [facility]   Visit Information   Goals Addressed   None    Patient Care Plan: [facility]CM Pharmacy Care Plan     Problem Identified: Problem: Hyperlipidemia, GERD, Hypothyroidism, Osteoarthritis, Allergic Rhinitis, and Migraines, Menopausal symptoms      Long-Range Goal: Patient-Specific Goal   Start Date: 04/20/2021  Expected End Date: 04/20/2022  This Visit's Progress: On track  Priority: High  Note:   Current Barriers:  Unable to independently monitor therapeutic efficacy Unable to achieve control of cholesterol   Pharmacist Clinical Goal(s):  Patient will achieve adherence to monitoring guidelines and medication adherence to achieve therapeutic efficacy achieve control of cholesterol as evidenced by next lipid panel  through collaboration with PharmD and provider.   Interventions: 1:1 collaboration with Laurey Morale, MD regarding development and update of comprehensive plan of care as evidenced by provider attestation and co-signature Inter-disciplinary care team collaboration (see longitudinal plan of care) Comprehensive medication review performed; medication list updated in electronic medical record  Hyperlipidemia: (LDL goal < 100) -Uncontrolled -Current treatment: Zetia 10 mg 1 tablet daily -Medications previously tried: pravastatin  -Current dietary patterns: eating less overall; stopping when she is full and has lost weight; little meat and more vegetables and salads; uses  olive oil -Current exercise habits: did not discuss -Educated on Cholesterol goals;  Importance of limiting foods high in cholesterol; Exercise goal of 150 minutes per week; -Counseled on diet and exercise extensively Recommended to continue current medication Counseled on benefits of PCSK9 inhibitor and patient preferred to hold off for now.  Migraines (Goal: minimize symptoms) -Controlled -Current treatment  Propranolol 40mg , 1 tablet once daily Fioricet 50-300-40mg , 1 capsule twice daily as needed for headache (some days does not take at all) Excedrin Migraine 250-250-65mg , 1 tablet as needed  (will take if catching migraine early) - very seldom -Medications previously tried: none  -Recommended to continue current medication  Hypothyroidism (Goal: TSH 0.35-4.5) -Controlled -Current treatment  Levothyroxine 22mcg, 1 tablet once daily  -Medications previously tried: none  -Recommended to continue current medication  Osteopenia (Goal prevent fractures) -Controlled -Last DEXA Scan: 02/2020   T-Score femoral neck: n/a  T-Score total hip: n/a  T-Score lumbar spine: -2.2  T-Score forearm radius: -1.1  10-year probability of major osteoporotic fracture: n/a  10-year probability of hip fracture: n/a -Patient is not a candidate for pharmacologic treatment -Current treatment  calcium citrate 1000mg , 1 tablet every evening  Multivitamin (vitamin D 1000 units) daily -Medications previously tried: none  -Recommend [phone] units of vitamin D daily. Recommend 1200 mg of calcium daily from dietary and supplemental sources. Recommend weight-bearing and muscle strengthening exercises for building and maintaining bone density. -Counseled on diet and exercise extensively Recommended to continue current medication  GERD (Goal: minimize symptoms) -Controlled -Current treatment  Omeprazole 20mg , 1 capsule once daily -Medications previously tried: none  -Recommended to continue current  medication  Allergic rhinitis (Goal: minimize symptoms) -Not ideally controlled -Current treatment  diphenhydramine 25mg , 1 capsule at bedtime Cetirizine  10mg , 1 tablet in the morning -Medications previously tried: none  -Counseled on risks of taking diphenhydramine in older adults  Chronic sinusitis (Goal: minimize symptoms) -Controlled -Current treatment  tobramycin 80mg , in sinus rinse bottle and use twice daily - uses once daily -Medications previously tried: none  -Recommended to continue current medication  Osteoarthritis (Goal: minimize symptoms of pain) -Controlled -Current treatment  Aleve 220mg  every morning tramadol 50mg , 2 tablets every six hours as needed for moderate pain (takes 2-3 times per week) vitamin B-6 200mg , 1 tablet once daily Biofreeze on back and ankles Voltaren gel as needed -Medications previously tried: none  -Recommended to continue current medication  Menopausal symptoms (Goal: minimize symptoms) -Not ideally controlled -Current treatment  Estradiol 0.5mg , 1 tablet once daily Progesterone 100mg ,  1 capsule at bedtime -Medications previously tried: none  -Counseled on risks of long term hormone therapy and patient is aware and does not wish to stop hormone therapy.  History of kidney stones (Goal: prevent kidney stone formation) -Controlled -Current treatment  lactobacillus (probiotic), 1 capsule once daily -Medications previously tried: HCTZ (frequent urination)  -Recommended decreasing vitamin C supplementation to 500 mg daily Educated on risk of recurrent kidney stones with vitamin C supplementation  Recurrent UTIs (Goal: prevent UTIs) -Controlled -Current treatment  methenamine 1 gram, 1 tablet twice daily cranberry 500mg , 1 capsule twice daily -Medications previously tried: none  -Recommended to continue current medication   Health Maintenance -Vaccine gaps: shingrix, COVID booster -Current therapy:  vitamin B complex-   Patient planning to hold and will see how she feels before restarting docusate 100mg - 1 tablet M,W,F,Sunday prochlorperazine 10mg , 1 tablet every six hours as needed for nausea/ vomiting betamethasone valerate 0.1% lotion- uses as needed hyoscyamine 0.125mg , 1 tablet as needed potassium gluconate 595mg , 1 tablet once daily -Educated on Cost vs benefit of each product must be carefully weighed by individual consumer -Patient is satisfied with current therapy and denies issues -Recommended to continue current medication  Patient Goals/Self-Care Activities Patient will:  - take medications as prescribed target a minimum of 150 minutes of moderate intensity exercise weekly  Follow Up Plan: Telephone follow up appointment with care management team member scheduled for: 1 year       Patient verbalizes understanding of instructions provided today and agrees to view in [place].  Telephone follow up appointment with pharmacy team member scheduled for: 1 year  Viona Gilmore, [employer]

## 2021-05-17 ENCOUNTER — Ambulatory Visit (INDEPENDENT_AMBULATORY_CARE_PROVIDER_SITE_OTHER): Payer: PPO | Admitting: Nurse Practitioner

## 2021-05-17 ENCOUNTER — Other Ambulatory Visit: Payer: Self-pay

## 2021-05-17 ENCOUNTER — Telehealth: Payer: Self-pay

## 2021-05-17 ENCOUNTER — Encounter: Payer: Self-pay | Admitting: Nurse Practitioner

## 2021-05-17 ENCOUNTER — Encounter: Payer: PPO | Admitting: Obstetrics and Gynecology

## 2021-05-17 ENCOUNTER — Other Ambulatory Visit (HOSPITAL_COMMUNITY)
Admission: RE | Admit: 2021-05-17 | Discharge: 2021-05-17 | Disposition: A | Discharge status: Home / Self Care | Payer: PPO | Source: Ambulatory Visit | Attending: Obstetrics and Gynecology | Admitting: Obstetrics and Gynecology

## 2021-05-17 VITALS — BP 124/80 | Ht 67.0 in | Wt 185.0 lb

## 2021-05-17 DIAGNOSIS — Z01419 Encounter for gynecological examination (general) (routine) without abnormal findings: Secondary | ICD-10-CM | POA: Diagnosis present

## 2021-05-17 DIAGNOSIS — Z8619 Personal history of other infectious and parasitic diseases: Secondary | ICD-10-CM | POA: Diagnosis not present

## 2021-05-17 DIAGNOSIS — Z780 Asymptomatic menopausal state: Secondary | ICD-10-CM

## 2021-05-17 DIAGNOSIS — M8580 Other specified disorders of bone density and structure, unspecified site: Secondary | ICD-10-CM | POA: Diagnosis not present

## 2021-05-17 DIAGNOSIS — Z1151 Encounter for screening for human papillomavirus (HPV): Secondary | ICD-10-CM | POA: Insufficient documentation

## 2021-05-17 DIAGNOSIS — B977 Papillomavirus as the cause of diseases classified elsewhere: Secondary | ICD-10-CM

## 2021-05-17 DIAGNOSIS — Z79890 Hormone replacement therapy (postmenopausal): Secondary | ICD-10-CM | POA: Diagnosis not present

## 2021-05-17 DIAGNOSIS — Z9189 Other specified personal risk factors, not elsewhere classified: Secondary | ICD-10-CM

## 2021-05-17 MED ORDER — PROGESTERONE MICRONIZED 100 MG PO CAPS: 100.0000 mg | ORAL_CAPSULE | Freq: Every day | ORAL | 3 refills | Status: DC

## 2021-05-17 MED ORDER — ESTRADIOL 0.5 MG PO TABS: 0.5000 mg | ORAL_TABLET | Freq: Every day | ORAL | 3 refills | Status: DC

## 2021-05-17 NOTE — Telephone Encounter (Signed)
I did end up refilling the Prometrium as well after she left. Thank you for double checking that.

## 2021-05-17 NOTE — Progress Notes (Signed)
Victoria Mosley [birth year] [id number]   History:  70 y.o. G1P1001 presents for breast and pelvic exam without GYN complaints. Menopausal since mid 11s, on HRT. Has tried to wean in the past but did not tolerate. History of persistent HPV (see below). Mild osteopenia managed by PCP. Normal mammogram history.   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 Saw Dr. Denman Mosley at [facility] who does not feel hysterectomy is best option as she would still require vaginal paps due to possibility of HPV of vulva/vagina 2019/2020 normal cytology positive HPV 2021 normal cytology negative HPV  Gynecologic History No LMP recorded. Patient is postmenopausal.   Contraception: post menopausal status  Health Maintenance Last Pap: 05/12/2020. Results were: Normal Last mammogram: 02/24/2021. Results were: Normal Last colonoscopy: 2018. Results were: tubular adenomas, 5-year recall Last Dexa: 03/23/2020. Results were: T-score -1.1  Past medical history, past surgical history, family history and social history were all reviewed and documented in the EPIC chart. Married. Retired. 94 yo son, has 3 daughters ages 6, 58, and 17.   ROS:  A ROS was performed and pertinent positives and negatives are included.  Exam:  Vitals:   05/17/21 0919  BP: 124/80  Weight: 185 lb (83.9 kg)  Height: 5\' 7"  (1.702 m)   Body mass index is 28.98 kg/m.  General appearance:  Normal Thyroid:  Symmetrical, normal in size, without palpable masses or nodularity. Respiratory  Auscultation:  Clear without wheezing or rhonchi Cardiovascular  Auscultation:  Regular rate, without rubs, murmurs or gallops  Edema/varicosities:  Not grossly evident Abdominal  Soft,nontender, without masses, guarding or rebound.  Liver/spleen:  No organomegaly noted  Hernia:  None appreciated  Skin  Inspection:   Grossly normal Breasts: Examined lying and sitting.   Right: Without masses, retractions, nipple discharge or axillary adenopathy.   Left: Without masses, retractions, nipple discharge or axillary adenopathy. Genitourinary   Inguinal/mons:  Normal without inguinal adenopathy  External genitalia:  Normal appearing vulva with no masses, tenderness, or lesions  BUS/Urethra/Skene's glands:  Normal  Vagina:  Normal appearing with normal color and discharge, no lesions  Cervix:  Normal appearing without discharge or lesions  Uterus:  Normal in size, shape and contour.  Midline and mobile, nontender  Adnexa/parametria:     Rt: Normal in size, without masses or tenderness.   Lt: Normal in size, without masses or tenderness.  Anus and perineum: Normal  Digital rectal exam: Normal sphincter tone without palpated masses or tenderness  Patient informed chaperone available to be present for breast and pelvic exam. Patient has requested no chaperone to be present. Patient has been advised what will be completed during breast and pelvic exam.   Assessment/Plan:  70 y.o. G1P1001 for breast and pelvic exam.   Well female exam with routine gynecological exam - Education provided on SBEs, importance of preventative screenings, current guidelines, high calcium diet, regular exercise, and multivitamin daily. Labs with PCP.   Osteopenia, unspecified location - managed by PCP. Very mild. T-score -1.1. She has had multiple orthopedic surgeries - foot, bilateral hips, right knee, laminectomy.   History of HPV infection - Plan: Cytology - PAP( [name]). See HPI. Normal last year. If normal again we will return to every 3 year screenings.   Postmenopausal - in mid 38s. on HRT.   Hormone replacement therapy (HRT) - Plan: estradiol (ESTRACE) 0.5 MG  tablet daily, Prometrium 100 mg daily. Has tried to wean in the past but did not tolerate. Discussed risks of blood clots, heart attack, stroke, and breast cancer  with continued use. She would like to continue. Refill x 1 year provided.   Screening for breast cancer - Normal mammogram history.  Continue annual screenings.  Normal breast exam today.  Screening for colon cancer - 2018 colonoscopy. Will repeat at GI's recommended interval.   Return in 1 year for annual.   [name] DNP, 10:51 AM 05/17/2021

## 2021-05-17 NOTE — Telephone Encounter (Signed)
Patient called stating she received text from [place] that you send int 11 Rx's for her and it should have only been 2.  [facility] only had one from [facility].  I called [facility] and the other Rxs are from another MD who sent them yesterday. I will tell her that.  Before I called her I wanted to check and see if you wanted to send Progesterone Rx in. She does have her uterus.

## 2021-05-17 NOTE — Telephone Encounter (Signed)
Spoke with patient and informed her [facility] sent only two Rx's and the others were from different provider.

## 2021-05-19 LAB — CYTOLOGY - PAP
Comment: NEGATIVE
Diagnosis: NEGATIVE
High risk HPV: NEGATIVE

## 2021-06-17 ENCOUNTER — Other Ambulatory Visit: Payer: Self-pay | Admitting: Family Medicine

## 2021-06-21 DIAGNOSIS — L812 Freckles: Secondary | ICD-10-CM | POA: Diagnosis not present

## 2021-06-21 DIAGNOSIS — L814 Other melanin hyperpigmentation: Secondary | ICD-10-CM | POA: Diagnosis not present

## 2021-06-21 DIAGNOSIS — L819 Disorder of pigmentation, unspecified: Secondary | ICD-10-CM | POA: Diagnosis not present

## 2021-08-08 ENCOUNTER — Other Ambulatory Visit: Payer: Self-pay

## 2021-08-08 ENCOUNTER — Ambulatory Visit (INDEPENDENT_AMBULATORY_CARE_PROVIDER_SITE_OTHER): Payer: PPO

## 2021-08-08 DIAGNOSIS — Z23 Encounter for immunization: Secondary | ICD-10-CM

## 2021-08-26 ENCOUNTER — Other Ambulatory Visit: Payer: Self-pay | Admitting: Family Medicine

## 2021-08-29 ENCOUNTER — Ambulatory Visit (INDEPENDENT_AMBULATORY_CARE_PROVIDER_SITE_OTHER): Payer: PPO

## 2021-08-29 ENCOUNTER — Telehealth: Payer: Self-pay | Admitting: Family Medicine

## 2021-08-29 ENCOUNTER — Telehealth: Payer: Self-pay

## 2021-08-29 DIAGNOSIS — Z0000 Encounter for general adult medical examination without abnormal findings: Secondary | ICD-10-CM

## 2021-08-29 NOTE — Telephone Encounter (Signed)
Spoke with Victoria Mosley states that she has enough Tramadol to last her until Dr Sarajane Jews returns to the office, message sent to Dr Sarajane Jews for advise

## 2021-08-29 NOTE — Telephone Encounter (Signed)
Patient called stating she let her Rx expire before getting the last refill and would like a call back  traMADol (ULTRAM) 50 MG tablet

## 2021-08-29 NOTE — Progress Notes (Addendum)
Subjective:   Victoria Mosley is a 70 y.o. female who presents for Medicare Annual (Subsequent) preventive examination.  I connected with Kristl Pattison today by telephone and verified that I am speaking with the correct person using two identifiers. Location patient: home Location provider: work Persons participating in the virtual visit: patient, provider.   I discussed the limitations, risks, security and privacy concerns of performing an evaluation and management service by telephone and the availability of in person appointments. I also discussed with the patient that there may be a patient responsible charge related to this service. The patient expressed understanding and verbally consented to this telephonic visit.    Interactive audio and video telecommunications were attempted between this provider and patient, however failed, due to patient having technical difficulties OR patient did not have access to video capability.  We continued and completed visit with audio only.    Review of Systems     Cardiac Risk Factors include: advanced age (>52men, >44 women);dyslipidemia     Objective:    Today's Vitals   There is no height or weight on file to calculate BMI.  Advanced Directives 08/29/2021 09/06/2020 08/15/2018 04/30/2017 02/15/2017 02/13/2017 02/02/2017  Does Patient Have a Medical Advance Directive? Yes Yes Yes No No No No  Type of Paramedic of [place];Living will [place];Living will - - - - -  Does patient want to make changes to medical advance directive? - No - Patient declined - - - - -  Copy of [place] in Chart? No - copy requested No - copy requested - - - - -  Would patient like information on creating a medical advance directive? - - - No - Patient declined No - Patient declined No - Patient declined No - Patient declined  Pre-existing out of facility DNR order (yellow form or pink MOST form) - - - -  - - -    Current Medications (verified) Outpatient Encounter Medications as of 08/29/2021  Medication Sig   amoxicillin (AMOXIL) 500 MG capsule amoxicillin 500 mg capsule  TAKE 4 CAPSULES BY MOUTH 1 HOUR BEFORE DENTAL APPOINTMENT FOR PREMED   b complex vitamins tablet Take 1 tablet by mouth daily.   betamethasone valerate lotion (VALISONE) 0.1 % Apply topically as needed.   CALCIUM CITRATE PO Take 1,000 mg by mouth every evening.   Cranberry 250 MG CAPS Take 500 mg by mouth 2 (two) times daily.   diclofenac Sodium (VOLTAREN) 1 % GEL Apply 4 g topically as needed.   diphenhydrAMINE (BENADRYL) 25 mg capsule Take 25 mg by mouth at bedtime.   DOCUSATE SODIUM PO Take 100 mg by mouth at bedtime. Takes Monday, Wednesday, Friday, Sunday   estradiol (ESTRACE) 0.5 MG tablet Take 1 tablet (0.5 mg total) by mouth daily.   ezetimibe (ZETIA) 10 MG tablet Take 1 tablet (10 mg total) by mouth daily.   FIORICET 50-300-40 MG CAPS TAKE ONE CAPSULE TWICE A DAY AS NEEDED FOR HEADACHE   LACTOBACILLUS PO Take 1 tablet by mouth daily.   levothyroxine (SYNTHROID) 75 MCG tablet Take 1 tablet by mouth once daily   LEVSIN 0.125 MG tablet Take 0.125 mg by mouth as needed.   loratadine (CLARITIN) 10 MG tablet Take 10 mg by mouth daily.   methenamine (HIPREX) 1 g tablet Take 1 g by mouth 2 (two) times daily with a meal.   Multiple Vitamins-Minerals (WOMENS 50+ MULTI VITAMIN/MIN PO) Take 1 tablet by mouth  daily.   naproxen sodium (ANAPROX) 220 MG tablet Take 220 mg by mouth daily.    omeprazole (PRILOSEC) 20 MG capsule Take 1 capsule (20 mg total) by mouth daily.   predniSONE (DELTASONE) 5 MG tablet Take 1 tablet (5 mg total) by mouth daily with breakfast.   prochlorperazine (COMPAZINE) 10 MG tablet TAKE 1 TABLET BY MOUTH EVERY 6 HOURS AS NEEDED FOR NAUSEA OR  VOMITING   progesterone (PROMETRIUM) 100 MG capsule Take 1 capsule (100 mg total) by mouth daily.   propranolol (INDERAL) 40 MG tablet Take 1 tablet (40 mg  total) by mouth daily.   pyridoxine (B-6) 200 MG tablet Take 200 mg by mouth daily.   TOBRAMYCIN OP 80 mg. EMPTY 1 CAPSULE IN SINUS RINSE BOTTLE AND USE TWICE DAILY   traMADol (ULTRAM) 50 MG tablet TAKE 2 TABLETS BY MOUTH EVERY 6 HOURS AS NEEDED FOR MODERATE PAIN   benzonatate (TESSALON) 200 MG capsule Take 1 capsule (200 mg total) by mouth every 6 (six) hours as needed for cough.   No facility-administered encounter medications on file as of 08/29/2021.    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 Denman George 04/30/2017, 2019 negative subtypes 16, 18/45   Hyperlipidemia    IBS (irritable bowel syndrome)    Kidney stones    bilateral   melanoma dx'd 2011   rt arm--surg only   Menopause    sees Dr. Phineas Real   Migraines    Nephrolithiasis    left   Neurogenic bladder    Normal cardiac stress test 2012   PONV (postoperative nausea and vomiting)    Sepsis ([place]) 01/2016   related to infection caused by kidney stone blockage    Tendonitis    right ankle   Thyroid disease    Urinary urgency    UTI (urinary tract infection)    started atb on 03-24-12   Past  Surgical History:  Procedure Laterality Date   BACK SURGERY  10/1991   L5 ruptured disc- Dr.Robinson   CARPAL TUNNEL RELEASE     CESAREAN SECTION  12/1980   COLONOSCOPY  01/29/2017   per Dr. Henrene Pastor, adenomatous polyps,  repeat in 5 yrs   CYSTOSCOPY W/ URETERAL STENT PLACEMENT Right 02/02/2017   Procedure: CYSTOSCOPY WITH RETROGRADE PYELOGRAM/URETERAL STENT PLACEMENT;  Surgeon: Irine Seal, MD;  Location: [facility];  Service: Urology;  Laterality: Right;   CYSTOSCOPY WITH RETROGRADE PYELOGRAM, URETEROSCOPY AND STENT PLACEMENT Left 12/25/2012   Procedure: CYSTOSCOPY WITH RETROGRADE PYELOGRAM, URETEROSCOPY, EXTRACTION OF  LEFT STONE WITH BASKETAND STENT PLACEMENT;  Surgeon: Molli Hazard, MD;  Location: [facility];  Service: Urology;  Laterality: Left;  2.5 HRS    CYSTOSCOPY/URETEROSCOPY/HOLMIUM LASER/STENT PLACEMENT Right 02/15/2017   Procedure: CYSTOSCOPY/URETEROSCOPY/HOLMIUM LASER/STENT EXCHANGE;  Surgeon: Ardis Hughs, MD;  Location: [facility];  Service: Urology;  Laterality: Right;   FOOT SURGERY Bilateral 1987   per Dr. Alphonzo Cruise   HEMATOMA EVACUATION  07/08/2012   Procedure: EVACUATION HEMATOMA;  Surgeon: Kristeen Miss, MD;  Location: [facility];  Service: Neurosurgery;  Laterality: N/A;  Cervical Seven-thoracic One Laminectomy and Decompression of Epidural Hematoma   HEMORRHOID BANDING     June and August 2016, Dr Rosendo Gros   KIDNEY STONE SURGERY  2005   per Dr. Reece Agar   LEEP  07/2015   final pathology LGSIL with clear margins negative ECC   LITHOTRIPSY  2007   x 3   [place], at L5 per Dr. Quentin Cornwall   MELANOMA EXCISION  2007   left upper arm per dr. Teressa Senter   NASAL SEPTUM SURGERY  1992   per Dr. Cynda Familia   NASAL/SINUS ENDOSCOPY  2017   per Dr. Melissa Montane    POSTERIOR CERVICAL LAMINECTOMY  07/08/2012   Procedure: POSTERIOR CERVICAL LAMINECTOMY;  Surgeon: Kristeen Miss, MD;  Location: [place] [facility];  Service: Neurosurgery;  Laterality: N/A;   Cervical Seven- thoracic One Laminectomy and Decompression of Epidural Hematoma   TOTAL HIP ARTHROPLASTY Right 2010   [place]   TOTAL HIP ARTHROPLASTY Left 09/27/2016   Procedure: LEFT TOTAL HIP ARTHROPLASTY ANTERIOR APPROACH;  Surgeon: Gaynelle Arabian, MD;  Location: [facility];  Service: Orthopedics;  Laterality: Left;   TOTAL KNEE ARTHROPLASTY Right 12/07/2014   Procedure: RIGHT TOTAL KNEE ARTHROPLASTY;  Surgeon: [name], MD;  Location: [facility];  Service: Orthopedics;  Laterality: Right;   TRIGGER FINGER RELEASE     TUBAL LIGATION  01/1988   Family History  Problem Relation Age of Onset   Heart disease Mother    Hypertension Mother    Diabetes Mother    Hypertension Father    Leukemia Father    Hypertension Sister    Heart disease Sister    Bladder Cancer Sister    Stomach cancer Paternal Grandmother    Alzheimer's disease Sister    Colon cancer Neg Hx    Esophageal cancer Neg Hx    Rectal cancer Neg Hx    Social History   Socioeconomic History   Marital status: Married    Spouse name: Remo Lipps   Number of children: 1   Years of education: Not on file   Highest education level: Not on file  Occupational History   Occupation: Counselling psychologist  Tobacco Use   Smoking status: Former    Packs/day: 0.25    Types: Cigarettes    Quit date: 12/20/1998    Years since quitting: 22.7   Smokeless tobacco: Never  Vaping Use   Vaping Use: Never used  Substance and Sexual Activity   Alcohol use: Yes    Comment: Very rare   Drug use: No   Sexual activity: Yes    Partners: Male    Birth control/protection: Post-menopausal    Comment: 1st intercourse 65 yo-2 partners  Other Topics Concern   Not on file  Social History Narrative   Not on file   Social Determinants of Health   Financial Resource Strain: Low Risk    Difficulty of Paying Living Expenses: Not hard at all  Food Insecurity: No Food Insecurity   Worried About Charity fundraiser in the  Last Year: Never true    Ran Out of Food in the Last Year: Never true  Transportation Needs: No Transportation Needs   Lack of Transportation (Medical): No   Lack of Transportation (Non-Medical): No  Physical Activity: Inactive   Days of Exercise per Week: 0 days   Minutes of Exercise per Session: 0 min  Stress: No Stress Concern Present   Feeling of Stress : Not at all  Social Connections: Moderately Integrated   Frequency of Communication with Friends and Family: Twice a week   Frequency of Social Gatherings with Friends and Family: Twice a week   Attends Religious Services: 1 to 4 times per year   Active Member of [employer] or Organizations: No   Attends Music therapist: Never   Marital Status: Married    Tobacco Counseling Counseling given: Not Answered   Clinical Intake:  Pre-visit preparation completed: Yes  Pain : No/denies pain     Nutritional Risks: None Diabetes: No  How often do you need to have someone help you when you read instructions, pamphlets, or other written materials from your doctor or pharmacy?: 1 - Never What is the last grade level you completed in school?: [place]  Interpreter Needed?: No  Information entered by :: [place] of Daily Living In your present state of health, do you have any difficulty performing the following activities: 08/29/2021 09/06/2020  Hearing? N Y  Comment - has tinnitus  Vision? N N  Difficulty concentrating or making decisions? N Y  Comment - Patient states has ADHD, and has some issues with remebering  Walking or climbing stairs? N N  Dressing or bathing? N N  Doing errands, shopping? N N  Preparing Food and eating ? N Y  Using the Toilet? N N  In the past six months, have you accidently leaked urine? N N  Do you have problems with loss of bowel control? N N  Managing your Medications? N N  Managing your Finances? N N  Housekeeping or managing your Housekeeping? N N  Some recent data might  be hidden    Patient Care Team: Laurey Morale, MD as PCP - General Louis Meckel Viona Gilmore, MD as Attending Physician (Urology) Phineas Real, Belinda Block, MD (Inactive) as Consulting Physician (Gynecology) Irene Shipper, MD as Consulting Physician (Gastroenterology) [name], MD as Consulting Physician (Orthopedic Surgery) Gaynelle Arabian, MD as Consulting Physician (Orthopedic Surgery) Marijean Niemann (Dermatology) Viona Gilmore, [facility] as Pharmacist (Pharmacist)  Indicate any recent Medical Services you may have received from other than [facility] providers in the past year (date may be approximate).     Assessment:   This is a routine wellness examination for [place].  Hearing/Vision screen Vision Screening - Comments:: Annual eye exams wear glasses   Dietary issues and exercise activities discussed: Current Exercise Habits: The patient does not participate in regular exercise at present   Goals Addressed             This Visit's Progress    Patient Stated   On track    Stay as healthy as possible        Depression Screen PHQ 2/9 Scores 08/29/2021 08/29/2021 09/06/2020 11/12/2019 08/26/2019 11/20/2018 08/15/2018  PHQ - 2 Score 0 0 0 1 0 0 0  PHQ- 9 Score - - 0 - - - -    Fall Risk Fall Risk  08/29/2021 08/25/2021 09/06/2020 11/12/2019 08/26/2019  Falls in the past year? 0  0 1 0 1  Number falls in past yr: 0 - 0 - 0  Injury with Fall? 0 - 0 - 1  Risk for fall due to : - - Impaired balance/gait - -  Follow up Falls evaluation completed - Falls evaluation completed;Falls prevention discussed Falls evaluation completed -    FALL RISK PREVENTION PERTAINING TO THE HOME:  Any stairs in or around the home? No  If so, are there any without handrails? Yes  Home free of loose throw rugs in walkways, pet beds, electrical cords, etc? Yes  Adequate lighting in your home to reduce risk of falls? Yes   ASSISTIVE DEVICES UTILIZED TO PREVENT FALLS:  Life alert? No  Use of a  cane, walker or w/c? No  Grab bars in the bathroom? No  Shower chair or bench in shower? No  Elevated toilet seat or a handicapped toilet? No    Cognitive Function: Normal cognitive status assessed by direct observation by this Nurse Health Advisor. No abnormalities found.   MMSE - Mini Mental State Exam 08/15/2018  Not completed: (No Data)     6CIT Screen 09/06/2020  What Year? 0 points  What month? 0 points  What time? 0 points  Count back from 20 0 points  Months in reverse 0 points  Repeat phrase 0 points  Total Score 0    Immunizations Immunization History  Administered Date(s) Administered   Fluad Quad(high Dose 65+) 08/25/2019, 08/26/2020, 08/08/2021   Influenza Split 07/11/2012   Influenza Whole 08/19/2010   Influenza, High Dose Seasonal PF 07/26/2017, 08/15/2018   Influenza,inj,quad, With Preservative 07/26/2017   Influenza-Unspecified 07/28/2015, 07/26/2016   PFIZER(Purple Top)SARS-COV-2 Vaccination 11/24/2019, 12/15/2019, 07/27/2020, 02/28/2021   Pneumococcal Conjugate-13 07/29/2015   Pneumococcal Polysaccharide-23 05/28/2012, 08/15/2018   Td 10/30/2001   Tdap 05/28/2012   Zoster, Live 05/28/2012    TDAP status: Up to date  Flu Vaccine status: Up to date  Pneumococcal vaccine status: Up to date  Covid-19 vaccine status: Completed vaccines  Qualifies for Shingles Vaccine? Yes   Zostavax completed No   Shingrix Completed?: No.    Education has been provided regarding the importance of this vaccine. Patient has been advised to call insurance company to determine out of pocket expense if they have not yet received this vaccine. Advised may also receive vaccine at local pharmacy or Health Dept. Verbalized acceptance and understanding.  Screening Tests Health Maintenance  Topic Date Due   Zoster Vaccines- Shingrix (2 of 2) 09/22/2021   COLONOSCOPY (Pts 45-47yrs Insurance coverage will need to be confirmed)  01/29/2022   MAMMOGRAM  02/24/2022    TETANUS/TDAP  05/28/2022   Pneumonia Vaccine 70+ Years old  Completed   INFLUENZA VACCINE  Completed   DEXA SCAN  Completed   COVID-19 Vaccine  Completed   Hepatitis C Screening  Completed   HPV VACCINES  Aged Out    Health Maintenance  There are no preventive care reminders to display for this patient.  Colorectal cancer screening: Type of screening: Colonoscopy. Completed 02/17/2017. Repeat every 5 years  Mammogram status: Completed 02/24/2021. Repeat every year  Bone Density status: Completed 03/23/2020. Results reflect: Bone density results: OSTEOPENIA. Repeat every 03/23/2020 years.  Lung Cancer Screening: (Low Dose CT Chest recommended if Age 26-80 years, 30 pack-year currently smoking OR have quit w/in 15years.) does not qualify.   Lung Cancer Screening Referral: n/a  Additional Screening:  Hepatitis C Screening: does not qualify; Completed 07/26/2015  Vision Screening: Recommended annual ophthalmology exams for early  detection of glaucoma and other disorders of the eye. Is the patient up to date with their annual eye exam?  Yes  Who is the provider or what is the name of the office in which the patient attends annual eye exams? Dr.Lyles  If pt is not established with a provider, would they like to be referred to a provider to establish care? No .   Dental Screening: Recommended annual dental exams for proper oral hygiene  Community Resource Referral / Chronic Care Management: CRR required this visit?  No   CCM required this visit?  No      Plan:     I have personally reviewed and noted the following in the patient's chart:   Medical and social history Use of alcohol, tobacco or illicit drugs  Current medications and supplements including opioid prescriptions.  Functional ability and status Nutritional status Physical activity Advanced directives List of other physicians Hospitalizations, surgeries, and ER visits in previous 12 months Vitals Screenings  to include cognitive, depression, and falls Referrals and appointments  In addition, I have reviewed and discussed with patient certain preventive protocols, quality metrics, and best practice recommendations. A written personalized care plan for preventive services as well as general preventive health recommendations were provided to patient.     [name], LPN   [id number]/06/[id number]   Nurse Notes: none  I have read this note and agree with its contents.  Alysia Penna, MD

## 2021-08-29 NOTE — Telephone Encounter (Signed)
Pt LOV was 12/10/2020 Last refill done on 02/23/2021 Pt PCP is out of the office this week Please advise

## 2021-08-29 NOTE — Patient Instructions (Signed)
[name] , Thank you for taking time to come for your Medicare Wellness Visit. I appreciate your ongoing commitment to your health goals. Please review the following plan we discussed and let me know if I can assist you in the future.   Screening recommendations/referrals: Colonoscopy: 02/17/2017  due 2023 Mammogram: 02/24/2021 Bone Density: 03/23/2020 Recommended yearly ophthalmology/optometry visit for glaucoma screening and checkup Recommended yearly dental visit for hygiene and checkup  Vaccinations: Influenza vaccine: completed  Pneumococcal vaccine: completed series  Tdap vaccine: 05/28/2012 Shingles vaccine: due 2nd dose     Advanced directives: will provide copies   Conditions/risks identified: none   Next appointment: none    Preventive Care 69 Years and Older, Female Preventive care refers to lifestyle choices and visits with your health care provider that can promote health and wellness. What does preventive care include? A yearly physical exam. This is also called an annual well check. Dental exams once or twice a year. Routine eye exams. Ask your health care provider how often you should have your eyes checked. Personal lifestyle choices, including: Daily care of your teeth and gums. Regular physical activity. Eating a healthy diet. Avoiding tobacco and drug use. Limiting alcohol use. Practicing safe sex. Taking low-dose aspirin every day. Taking vitamin and mineral supplements as recommended by your health care provider. What happens during an annual well check? The services and screenings done by your health care provider during your annual well check will depend on your age, overall health, lifestyle risk factors, and family history of disease. Counseling  Your health care provider may ask you questions about your: Alcohol use. Tobacco use. Drug use. Emotional well-being. Home and relationship well-being. Sexual activity. Eating habits. History of  falls. Memory and ability to understand (cognition). Work and work Statistician. Reproductive health. Screening  You may have the following tests or measurements: Height, weight, and BMI. Blood pressure. Lipid and cholesterol levels. These may be checked every 5 years, or more frequently if you are over 69 years old. Skin check. Lung cancer screening. You may have this screening every year starting at age 69 if you have a 30-pack-year history of smoking and currently smoke or have quit within the past 15 years. Fecal occult blood test (FOBT) of the stool. You may have this test every year starting at age 69. Flexible sigmoidoscopy or colonoscopy. You may have a sigmoidoscopy every 5 years or a colonoscopy every 10 years starting at age 69. Hepatitis C blood test. Hepatitis B blood test. Sexually transmitted disease (STD) testing. Diabetes screening. This is done by checking your blood sugar (glucose) after you have not eaten for a while (fasting). You may have this done every 1-3 years. Bone density scan. This is done to screen for osteoporosis. You may have this done starting at age 52. Mammogram. This may be done every 1-2 years. Talk to your health care provider about how often you should have regular mammograms. Talk with your health care provider about your test results, treatment options, and if necessary, the need for more tests. Vaccines  Your health care provider may recommend certain vaccines, such as: Influenza vaccine. This is recommended every year. Tetanus, diphtheria, and acellular pertussis (Tdap, Td) vaccine. You may need a Td booster every 10 years. Zoster vaccine. You may need this after age 59. Pneumococcal 13-valent conjugate (PCV13) vaccine. One dose is recommended after age 67. Pneumococcal polysaccharide (PPSV23) vaccine. One dose is recommended after age 63. Talk to your health care provider about which screenings  and vaccines you need and how often you need  them. This information is not intended to replace advice given to you by your health care provider. Make sure you discuss any questions you have with your health care provider. Document Released: 11/12/2015 Document Revised: 07/05/2016 Document Reviewed: 08/17/2015 Elsevier Interactive Patient Education  2017 [employer] Prevention in the Home Falls can cause injuries. They can happen to people of all ages. There are many things you can do to make your home safe and to help prevent falls. What can I do on the outside of my home? Regularly fix the edges of walkways and driveways and fix any cracks. Remove anything that might make you trip as you walk through a door, such as a raised step or threshold. Trim any bushes or trees on the path to your home. Use bright outdoor lighting. Clear any walking paths of anything that might make someone trip, such as rocks or tools. Regularly check to see if handrails are loose or broken. Make sure that both sides of any steps have handrails. Any raised decks and porches should have guardrails on the edges. Have any leaves, snow, or ice cleared regularly. Use sand or salt on walking paths during winter. Clean up any spills in your garage right away. This includes oil or grease spills. What can I do in the bathroom? Use night lights. Install grab bars by the toilet and in the tub and shower. Do not use towel bars as grab bars. Use non-skid mats or decals in the tub or shower. If you need to sit down in the shower, use a plastic, non-slip stool. Keep the floor dry. Clean up any water that spills on the floor as soon as it happens. Remove soap buildup in the tub or shower regularly. Attach bath mats securely with double-sided non-slip rug tape. Do not have throw rugs and other things on the floor that can make you trip. What can I do in the bedroom? Use night lights. Make sure that you have a light by your bed that is easy to reach. Do not use  any sheets or blankets that are too big for your bed. They should not hang down onto the floor. Have a firm chair that has side arms. You can use this for support while you get dressed. Do not have throw rugs and other things on the floor that can make you trip. What can I do in the kitchen? Clean up any spills right away. Avoid walking on wet floors. Keep items that you use a lot in easy-to-reach places. If you need to reach something above you, use a strong step stool that has a grab bar. Keep electrical cords out of the way. Do not use floor polish or wax that makes floors slippery. If you must use wax, use non-skid floor wax. Do not have throw rugs and other things on the floor that can make you trip. What can I do with my stairs? Do not leave any items on the stairs. Make sure that there are handrails on both sides of the stairs and use them. Fix handrails that are broken or loose. Make sure that handrails are as long as the stairways. Check any carpeting to make sure that it is firmly attached to the stairs. Fix any carpet that is loose or worn. Avoid having throw rugs at the top or bottom of the stairs. If you do have throw rugs, attach them to the floor with carpet tape.  Make sure that you have a light switch at the top of the stairs and the bottom of the stairs. If you do not have them, ask someone to add them for you. What else can I do to help prevent falls? Wear shoes that: Do not have high heels. Have rubber bottoms. Are comfortable and fit you well. Are closed at the toe. Do not wear sandals. If you use a stepladder: Make sure that it is fully opened. Do not climb a closed stepladder. Make sure that both sides of the stepladder are locked into place. Ask someone to hold it for you, if possible. Clearly mark and make sure that you can see: Any grab bars or handrails. First and last steps. Where the edge of each step is. Use tools that help you move around (mobility aids)  if they are needed. These include: Canes. Walkers. Scooters. Crutches. Turn on the lights when you go into a dark area. Replace any light bulbs as soon as they burn out. Set up your furniture so you have a clear path. Avoid moving your furniture around. If any of your floors are uneven, fix them. If there are any pets around you, be aware of where they are. Review your medicines with your doctor. Some medicines can make you feel dizzy. This can increase your chance of falling. Ask your doctor what other things that you can do to help prevent falls. This information is not intended to replace advice given to you by your health care provider. Make sure you discuss any questions you have with your health care provider. Document Released: 08/12/2009 Document Revised: 03/23/2016 Document Reviewed: 11/20/2014 Elsevier Interactive Patient Education  2017 [employer].

## 2021-08-29 NOTE — Telephone Encounter (Signed)
[facility] called to get refill on traMADol (ULTRAM) 50 MG tablet    Please send to [place], [address] Phone:  [phone]  Fax:  [phone]       I did let them know Dr.Fry was out of office this week    Please advise

## 2021-08-29 NOTE — Telephone Encounter (Signed)
Spoke with pt state that she tried to refill her Tramadol but pharmacy state that she was one day late for the refill, pt state that she has enough to last her until Dr Sarajane Jews returns to the office, pt wants refill for when she runs out of  the current refill she is on. LOV 11/30/2020 Last refill done 02/23/2021

## 2021-09-01 DIAGNOSIS — M79642 Pain in left hand: Secondary | ICD-10-CM | POA: Diagnosis not present

## 2021-09-01 DIAGNOSIS — M79641 Pain in right hand: Secondary | ICD-10-CM | POA: Diagnosis not present

## 2021-09-01 DIAGNOSIS — M13849 Other specified arthritis, unspecified hand: Secondary | ICD-10-CM | POA: Diagnosis not present

## 2021-09-01 DIAGNOSIS — R2230 Localized swelling, mass and lump, unspecified upper limb: Secondary | ICD-10-CM | POA: Diagnosis not present

## 2021-09-02 MED ORDER — TRAMADOL HCL 50 MG PO TABS: ORAL_TABLET | ORAL | 5 refills | Status: DC

## 2021-09-02 NOTE — Addendum Note (Signed)
Addended by: Alysia Penna A on: 09/02/2021 01:46 PM   Modules accepted: Orders

## 2021-09-02 NOTE — Telephone Encounter (Signed)
Done

## 2021-09-07 ENCOUNTER — Ambulatory Visit: Payer: PPO

## 2021-09-29 HISTORY — PX: CYST REMOVAL HAND: SHX6279

## 2021-10-07 ENCOUNTER — Telehealth: Payer: Self-pay | Admitting: Pharmacist

## 2021-10-07 NOTE — Chronic Care Management (AMB) (Signed)
Chronic Care Management Pharmacy Assistant   Name: Victoria Mosley  MRN: [id number] DOB: 13-Oct-1951   Reason for Encounter: Disease State   Conditions to be addressed/monitored: HLD  Recent office visits:  08/29/21 Victoria Pigg, LPN - Patient presented for Medicare Annual Wellness exam. No medication changes.  Recent consult visits:  09/01/21 Victoria Mosley  ([facility])- Patient presented for Arthritis of the hand and pain. No medication changes.  06/21/21 Victoria Mosley (Specialist) - Patient presented for Disorder of pigmentation and other concerns. No other visit details available.  05/17/21 Victoria Gammon, NP (Gynecology) - Patient presented for Well Woman routine exam. Stopped HCTZ 25 mg. Stopped Hydrocodone - Homatropine 5-1.5 mg  Hospital visits:  None in previous 6 months  Medications: Outpatient Encounter Medications as of 10/07/2021  Medication Sig   amoxicillin (AMOXIL) 500 MG capsule amoxicillin 500 mg capsule  TAKE 4 CAPSULES BY MOUTH 1 HOUR BEFORE DENTAL APPOINTMENT FOR PREMED   b complex vitamins tablet Take 1 tablet by mouth daily.   benzonatate (TESSALON) 200 MG capsule Take 1 capsule (200 mg total) by mouth every 6 (six) hours as needed for cough.   betamethasone valerate lotion (VALISONE) 0.1 % Apply topically as needed.   CALCIUM CITRATE PO Take 1,000 mg by mouth every evening.   Cranberry 250 MG CAPS Take 500 mg by mouth 2 (two) times daily.   diclofenac Sodium (VOLTAREN) 1 % GEL Apply 4 g topically as needed.   diphenhydrAMINE (BENADRYL) 25 mg capsule Take 25 mg by mouth at bedtime.   DOCUSATE SODIUM PO Take 100 mg by mouth at bedtime. Takes Monday, Wednesday, Friday, Sunday   estradiol (ESTRACE) 0.5 MG tablet Take 1 tablet (0.5 mg total) by mouth daily.   ezetimibe (ZETIA) 10 MG tablet Take 1 tablet (10 mg total) by mouth daily.   FIORICET 50-300-40 MG CAPS TAKE ONE CAPSULE TWICE A DAY AS NEEDED FOR HEADACHE   LACTOBACILLUS PO Take 1 tablet by  mouth daily.   levothyroxine (SYNTHROID) 75 MCG tablet Take 1 tablet by mouth once daily   LEVSIN 0.125 MG tablet Take 0.125 mg by mouth as needed.   loratadine (CLARITIN) 10 MG tablet Take 10 mg by mouth daily.   methenamine (HIPREX) 1 g tablet Take 1 g by mouth 2 (two) times daily with a meal.   Multiple Vitamins-Minerals (WOMENS 50+ MULTI VITAMIN/MIN PO) Take 1 tablet by mouth daily.   naproxen sodium (ANAPROX) 220 MG tablet Take 220 mg by mouth daily.    omeprazole (PRILOSEC) 20 MG capsule Take 1 capsule (20 mg total) by mouth daily.   predniSONE (DELTASONE) 5 MG tablet Take 1 tablet (5 mg total) by mouth daily with breakfast.   prochlorperazine (COMPAZINE) 10 MG tablet TAKE 1 TABLET BY MOUTH EVERY 6 HOURS AS NEEDED FOR NAUSEA OR  VOMITING   progesterone (PROMETRIUM) 100 MG capsule Take 1 capsule (100 mg total) by mouth daily.   propranolol (INDERAL) 40 MG tablet Take 1 tablet (40 mg total) by mouth daily.   pyridoxine (B-6) 200 MG tablet Take 200 mg by mouth daily.   TOBRAMYCIN OP 80 mg. EMPTY 1 CAPSULE IN SINUS RINSE BOTTLE AND USE TWICE DAILY   traMADol (ULTRAM) 50 MG tablet TAKE 2 TABLETS BY MOUTH EVERY 6 HOURS AS NEEDED FOR MODERATE PAIN   No facility-administered encounter medications on file as of 10/07/2021.  10/07/2021 Name: Victoria Mosley MRN: [id number] DOB: [birth year] Victoria Mosley is a 70 y.o. year old  female who is a primary care patient of Laurey Morale, MD.  Comprehensive medication review performed; Spoke to patient regarding cholesterol  Lipid Panel    Component Value Date/Time   CHOL 223 (H) 11/30/2020 0842   TRIG 314.0 (H) 11/30/2020 0842   TRIG 91 10/05/2006 1002   HDL 44.20 11/30/2020 0842   LDLCALC 115 (H) 11/19/2017 0917   LDLDIRECT 150.0 11/30/2020 0842    10-year ASCVD risk score: The ASCVD Risk score (Arnett DK, et al., 2019) failed to calculate for the following reasons:   The systolic blood pressure is missing  Current antihyperlipidemic regimen:   Zetia 10 mg 1 tablet daily Previous antihyperlipidemic medications tried: None  ASCVD risk enhancing conditions: age >13 What recent interventions/DTPs have been made by any provider to improve Cholesterol control since last CPP Visit: Patient reports no changes she advised she has been on this medication for several years and has been tolerating it with no effects or issues Any recent hospitalizations or ED visits since last visit with CPP? None What diet changes have been made to improve Cholesterol?  Patient reports she is eating better still and drinking water What exercise is being done to improve Cholesterol?  Patient reports she and her husband are retired and travel every month and she has just returned from a cruise and has lost more weight is now down to 186  Adherence Review: Does the patient have >5 day gap between last estimated fill dates? No  Notes: Patient reports she has been having some pain that is consistent near her navel had concerns that it may be more internal, she states she is waiting to hear if she will be prescribed antibiotics for a procedure she had on her finger on today to give the office an appointment  for her stomach. She reports she has had some gas-x and that seems to be helping her at the moment but will call for an appointment.  Care Gaps: AWV - 08/29/21 Zoster Vaccine - Overdue CCM -6/23  Star Rating Drugs: None   Patient Assistance: None   Ned Clines [place] Clinical Pharmacist Assistant [phone]

## 2021-10-10 DIAGNOSIS — R2231 Localized swelling, mass and lump, right upper limb: Secondary | ICD-10-CM | POA: Diagnosis not present

## 2021-10-10 DIAGNOSIS — Z4789 Encounter for other orthopedic aftercare: Secondary | ICD-10-CM | POA: Diagnosis not present

## 2021-11-24 DIAGNOSIS — J320 Chronic maxillary sinusitis: Secondary | ICD-10-CM | POA: Diagnosis not present

## 2021-12-05 DIAGNOSIS — B078 Other viral warts: Secondary | ICD-10-CM | POA: Diagnosis not present

## 2021-12-05 DIAGNOSIS — L538 Other specified erythematous conditions: Secondary | ICD-10-CM | POA: Diagnosis not present

## 2021-12-05 DIAGNOSIS — L814 Other melanin hyperpigmentation: Secondary | ICD-10-CM | POA: Diagnosis not present

## 2021-12-05 DIAGNOSIS — L821 Other seborrheic keratosis: Secondary | ICD-10-CM | POA: Diagnosis not present

## 2021-12-28 HISTORY — PX: CYST REMOVAL HAND: SHX6279

## 2022-01-11 DIAGNOSIS — R2232 Localized swelling, mass and lump, left upper limb: Secondary | ICD-10-CM | POA: Diagnosis not present

## 2022-01-11 DIAGNOSIS — Z4789 Encounter for other orthopedic aftercare: Secondary | ICD-10-CM | POA: Diagnosis not present

## 2022-01-12 DIAGNOSIS — H2513 Age-related nuclear cataract, bilateral: Secondary | ICD-10-CM | POA: Diagnosis not present

## 2022-01-12 DIAGNOSIS — H5203 Hypermetropia, bilateral: Secondary | ICD-10-CM | POA: Diagnosis not present

## 2022-01-17 ENCOUNTER — Other Ambulatory Visit: Payer: Self-pay | Admitting: Family Medicine

## 2022-01-23 DIAGNOSIS — N3020 Other chronic cystitis without hematuria: Secondary | ICD-10-CM | POA: Diagnosis not present

## 2022-01-25 ENCOUNTER — Encounter: Payer: Self-pay | Admitting: Family Medicine

## 2022-01-25 ENCOUNTER — Ambulatory Visit: Payer: PPO | Admitting: Family Medicine

## 2022-01-25 VITALS — BP 124/72 | HR 65 | Temp 99.4°F | Ht 67.0 in | Wt 186.0 lb

## 2022-01-25 DIAGNOSIS — Z23 Encounter for immunization: Secondary | ICD-10-CM

## 2022-01-25 DIAGNOSIS — R5383 Other fatigue: Secondary | ICD-10-CM | POA: Diagnosis not present

## 2022-01-25 DIAGNOSIS — Z0000 Encounter for general adult medical examination without abnormal findings: Secondary | ICD-10-CM | POA: Diagnosis not present

## 2022-01-25 DIAGNOSIS — E039 Hypothyroidism, unspecified: Secondary | ICD-10-CM | POA: Diagnosis not present

## 2022-01-25 LAB — CBC WITH DIFFERENTIAL/PLATELET
Basophils Absolute: 0 10*3/uL (ref 0.0–0.1)
Basophils Relative: 0.4 % (ref 0.0–3.0)
Eosinophils Absolute: 0.2 10*3/uL (ref 0.0–0.7)
Eosinophils Relative: 2.5 % (ref 0.0–5.0)
HCT: 44.3 % (ref 36.0–46.0)
Hemoglobin: 15 g/dL (ref 12.0–15.0)
Lymphocytes Relative: 35.7 % (ref 12.0–46.0)
Lymphs Abs: 2.3 10*3/uL (ref 0.7–4.0)
MCHC: 33.9 g/dL (ref 30.0–36.0)
MCV: 89.7 fl (ref 78.0–100.0)
Monocytes Absolute: 0.7 10*3/uL (ref 0.1–1.0)
Monocytes Relative: 11.2 % (ref 3.0–12.0)
Neutro Abs: 3.3 10*3/uL (ref 1.4–7.7)
Neutrophils Relative %: 50.2 % (ref 43.0–77.0)
Platelets: 235 10*3/uL (ref 150.0–400.0)
RBC: 4.94 Mil/uL (ref 3.87–5.11)
RDW: 13.3 % (ref 11.5–15.5)
WBC: 6.6 10*3/uL (ref 4.0–10.5)

## 2022-01-25 LAB — HEPATIC FUNCTION PANEL
ALT: 23 U/L (ref 0–35)
AST: 23 U/L (ref 0–37)
Albumin: 4.3 g/dL (ref 3.5–5.2)
Alkaline Phosphatase: 64 U/L (ref 39–117)
Bilirubin, Direct: 0.1 mg/dL (ref 0.0–0.3)
Total Bilirubin: 0.4 mg/dL (ref 0.2–1.2)
Total Protein: 7 g/dL (ref 6.0–8.3)

## 2022-01-25 LAB — BASIC METABOLIC PANEL
BUN: 19 mg/dL (ref 6–23)
CO2: 30 mEq/L (ref 19–32)
Calcium: 9.6 mg/dL (ref 8.4–10.5)
Chloride: 104 mEq/L (ref 96–112)
Creatinine, Ser: 0.68 mg/dL (ref 0.40–1.20)
GFR: 88.67 mL/min (ref >60.00)
Glucose, Bld: 111 mg/dL — ABNORMAL HIGH (ref 70–99)
Potassium: 4.1 mEq/L (ref 3.5–5.1)
Sodium: 141 mEq/L (ref 135–145)

## 2022-01-25 LAB — LIPID PANEL
Cholesterol: 217 mg/dL — ABNORMAL HIGH (ref 0–200)
HDL: 49.6 mg/dL (ref >39.00)
NonHDL: 167.58
Total CHOL/HDL Ratio: 4
Triglycerides: 257 mg/dL — ABNORMAL HIGH (ref 0.0–149.0)
VLDL: 51.4 mg/dL — ABNORMAL HIGH (ref 0.0–40.0)

## 2022-01-25 LAB — T4, FREE: Free T4: 0.99 ng/dL (ref 0.60–1.60)

## 2022-01-25 LAB — LDL CHOLESTEROL, DIRECT: Direct LDL: 154 mg/dL

## 2022-01-25 LAB — T3, FREE: T3, Free: 3.3 pg/mL (ref 2.3–4.2)

## 2022-01-25 LAB — TSH: TSH: 2.53 u[IU]/mL (ref 0.35–5.50)

## 2022-01-25 LAB — HEMOGLOBIN A1C: Hgb A1c MFr Bld: 6.4 % (ref 4.6–6.5)

## 2022-01-25 LAB — VITAMIN B12: Vitamin B-12: 372 pg/mL (ref 211–911)

## 2022-01-25 MED ORDER — LEVOTHYROXINE SODIUM 75 MCG PO TABS: 75.0000 ug | ORAL_TABLET | Freq: Every day | ORAL | 3 refills | Status: DC

## 2022-01-25 MED ORDER — OMEPRAZOLE 20 MG PO CPDR: 20.0000 mg | DELAYED_RELEASE_CAPSULE | Freq: Every day | ORAL | 3 refills | Status: DC

## 2022-01-25 MED ORDER — PROPRANOLOL HCL 40 MG PO TABS
40.0000 mg | ORAL_TABLET | Freq: Every day | ORAL | 3 refills | Status: DC
Start: 2022-01-25 — End: 2023-02-20

## 2022-01-25 MED ORDER — EZETIMIBE 10 MG PO TABS: 10.0000 mg | ORAL_TABLET | Freq: Every day | ORAL | 3 refills | Status: DC

## 2022-01-25 MED ORDER — PREDNISONE 5 MG PO TABS: 5.0000 mg | ORAL_TABLET | Freq: Every day | ORAL | 1 refills | Status: DC

## 2022-01-25 NOTE — Progress Notes (Signed)
? ?Subjective:  ? ? Patient ID: Victoria Mosley, female    DOB: 1951/09/14, 71 y.o.   MRN: [id number] ? ?HPI ?Here for a well exam. She has a few items to discuss. First she has been very tired the past few months, and she asks [place] to check a B12 level today. She admits to having trouble with sleep, and she asks for help. She is due for another colonoscopy next month, but she has not heard from the GI office as yet.  ? ? ?Review of Systems  ?Constitutional:  Positive for fatigue.  ?HENT: Negative.    ?Eyes: Negative.   ?Respiratory: Negative.    ?Cardiovascular: Negative.   ?Gastrointestinal: Negative.   ?Genitourinary:  Negative for decreased urine volume, difficulty urinating, dyspareunia, dysuria, enuresis, flank pain, frequency, hematuria, pelvic pain and urgency.  ?Musculoskeletal: Negative.   ?Skin: Negative.   ?Neurological: Negative.  Negative for headaches.  ?Psychiatric/Behavioral:  Positive for sleep disturbance.   ? ?   ?Objective:  ? Physical Exam ?Constitutional:   ?   General: She is not in acute distress. ?   Appearance: Normal appearance. She is well-developed.  ?HENT:  ?   Head: Normocephalic and atraumatic.  ?   Right Ear: External ear normal.  ?   Left Ear: External ear normal.  ?   Nose: Nose normal.  ?   Mouth/Throat:  ?   Pharynx: No oropharyngeal exudate.  ?Eyes:  ?   General: No scleral icterus. ?   Conjunctiva/sclera: Conjunctivae normal.  ?   Pupils: Pupils are equal, round, and reactive to light.  ?Neck:  ?   Thyroid: No thyromegaly.  ?   Vascular: No JVD.  ?Cardiovascular:  ?   Rate and Rhythm: Normal rate and regular rhythm.  ?   Heart sounds: Normal heart sounds. No murmur heard. ?  No friction rub. No gallop.  ?Pulmonary:  ?   Effort: Pulmonary effort is normal. No respiratory distress.  ?   Breath sounds: Normal breath sounds. No wheezing or rales.  ?Chest:  ?   Chest wall: No tenderness.  ?Abdominal:  ?   General: Bowel sounds are normal. There is no distension.  ?   Palpations:  Abdomen is soft. There is no mass.  ?   Tenderness: There is no abdominal tenderness. There is no guarding or rebound.  ?Musculoskeletal:     ?   General: No tenderness. Normal range of motion.  ?   Cervical back: Normal range of motion and neck supple.  ?Lymphadenopathy:  ?   Cervical: No cervical adenopathy.  ?Skin: ?   General: Skin is warm and dry.  ?   Findings: No erythema or rash.  ?Neurological:  ?   Mental Status: She is alert and oriented to person, place, and time.  ?   Cranial Nerves: No cranial nerve deficit.  ?   Motor: No abnormal muscle tone.  ?   Coordination: Coordination normal.  ?   Deep Tendon Reflexes: Reflexes are normal and symmetric. Reflexes normal.  ?Psychiatric:     ?   Behavior: Behavior normal.     ?   Thought Content: Thought content normal.     ?   Judgment: Judgment normal.  ? ? ? ? ? ?   ?Assessment & Plan:  ?Well exam. We discussed diet and exercise. For the fatigue wewill add a B12 level to her usual wellness labs. For sleep I suggested she try taking 10-20 mg of Melatonin at  bedtime. Get fasting labs. She will contact the GI office about setting up a colonoscopy.  ?Alysia Penna, MD ? ? ?

## 2022-02-27 ENCOUNTER — Other Ambulatory Visit: Payer: Self-pay | Admitting: Family Medicine

## 2022-02-28 ENCOUNTER — Telehealth: Payer: Self-pay | Admitting: Internal Medicine

## 2022-02-28 ENCOUNTER — Encounter: Payer: Self-pay | Admitting: Internal Medicine

## 2022-02-28 NOTE — Telephone Encounter (Signed)
Hi Dr. Henrene Pastor, ? ?This patient was on recall for April of this year and has called requesting to make her appointment for July.  Recall assessment nor letter was available.  Please review for accuracy. ? ?Thank you. ?

## 2022-02-28 NOTE — Telephone Encounter (Signed)
Okay for surveillance colonoscopy in the [place].  She can see previsit. ?If she prefers July, that is okay. ?

## 2022-02-28 NOTE — Telephone Encounter (Signed)
Last OV- 01/25/22 ?Last refill- 06/20/21-60 caps, 5 refills ? ?No future OV scheduled.  ? ? ?

## 2022-03-02 DIAGNOSIS — Z1231 Encounter for screening mammogram for malignant neoplasm of breast: Secondary | ICD-10-CM | POA: Diagnosis not present

## 2022-03-02 LAB — HM MAMMOGRAPHY

## 2022-03-03 ENCOUNTER — Encounter: Payer: Self-pay | Admitting: Family Medicine

## 2022-03-04 ENCOUNTER — Other Ambulatory Visit: Payer: Self-pay | Admitting: Nurse Practitioner

## 2022-03-04 ENCOUNTER — Other Ambulatory Visit: Payer: Self-pay | Admitting: Family Medicine

## 2022-03-04 DIAGNOSIS — Z79890 Hormone replacement therapy (postmenopausal): Secondary | ICD-10-CM

## 2022-03-06 ENCOUNTER — Telehealth: Payer: Self-pay | Admitting: Family Medicine

## 2022-03-06 DIAGNOSIS — M810 Age-related osteoporosis without current pathological fracture: Secondary | ICD-10-CM

## 2022-03-06 NOTE — Telephone Encounter (Signed)
Requesting a bone density exam ?

## 2022-03-06 NOTE — Telephone Encounter (Signed)
Last AEX 05/17/21--scheduled 05/25/22. ?Last mammo 03/02/22.  ? ?Per pharmacy--last refill 01/17/22, so should have enough until 04/17/22 for that one.  ?

## 2022-03-06 NOTE — Telephone Encounter (Signed)
Pt LOV was on 2/29/2023 ?Last refill done on 09/02/2021 ?Please advise ?

## 2022-03-08 NOTE — Telephone Encounter (Signed)
Done

## 2022-03-08 NOTE — Telephone Encounter (Signed)
Called patient message given bone density order has been placed. ?

## 2022-04-21 ENCOUNTER — Telehealth: Payer: PPO

## 2022-04-24 ENCOUNTER — Other Ambulatory Visit: Payer: Self-pay | Admitting: Nurse Practitioner

## 2022-04-24 DIAGNOSIS — Z79890 Hormone replacement therapy (postmenopausal): Secondary | ICD-10-CM

## 2022-04-27 ENCOUNTER — Telehealth: Payer: Self-pay | Admitting: *Deleted

## 2022-04-27 ENCOUNTER — Telehealth: Payer: Self-pay | Admitting: Family Medicine

## 2022-04-27 DIAGNOSIS — Z79890 Hormone replacement therapy (postmenopausal): Secondary | ICD-10-CM

## 2022-04-27 NOTE — Telephone Encounter (Signed)
Pt called to ask where is her referral for her Bone Density Test? Pt states she's been waiting since May to hear back on when and where her appointment is. Pt asked for a call back.   She also stated she will not be available the week of the 16th of July, 2023 as she and her husband will be out of town.   Please advise.

## 2022-04-27 NOTE — Telephone Encounter (Signed)
Patient called stating she picked up her HRT and she has 90 day supply of estradiol tablet, but only 45 day supply of progesterone. Patient said she would like to keep the amount consistent and have 90 day supply of progesterone as well. Patient was confused why only a 45 day supply was sent. Please advise

## 2022-04-28 MED ORDER — PROGESTERONE MICRONIZED 100 MG PO CAPS: 100.0000 mg | ORAL_CAPSULE | Freq: Every day | ORAL | 0 refills | Status: DC

## 2022-04-28 NOTE — Telephone Encounter (Signed)
Yes, please send.  Thanks

## 2022-04-28 NOTE — Telephone Encounter (Signed)
Patient said Victoria Mosley would like the reminding 45 tablet sent to pharmacy since she already has 90 day supply of estradiol. Patient asked if you could send in please. Okay to send?

## 2022-04-28 NOTE — Telephone Encounter (Signed)
Yes, 90 day supply is fine. I will make sure to update this next time she is due for refill. Thanks.

## 2022-04-28 NOTE — Telephone Encounter (Signed)
Rx sent 

## 2022-05-05 ENCOUNTER — Ambulatory Visit (AMBULATORY_SURGERY_CENTER): Payer: Self-pay | Admitting: *Deleted

## 2022-05-05 VITALS — Ht 67.0 in | Wt 185.0 lb

## 2022-05-05 DIAGNOSIS — Z86010 Personal history of colonic polyps: Secondary | ICD-10-CM

## 2022-05-05 MED ORDER — NA SULFATE-K SULFATE-MG SULF 17.5-3.13-1.6 GM/177ML PO SOLN: 1.0000 | Freq: Once | ORAL | 0 refills | Status: AC

## 2022-05-05 NOTE — Progress Notes (Signed)
No egg or soy allergy known to patient  issues known to pt with past sedation with any surgeries or procedures of ponv  Patient denies ever being told they had issues or difficulty with intubation  No FH of Malignant Hyperthermia Pt is not on diet pills Pt is not on  home 02  Pt is not on blood thinners  Pt denies issues with constipation  No A fib or A flutter Have any cardiac testing pending--pt denies   Pt instructed to use [email or web address] or GoodRx for a price reduction on prep

## 2022-05-23 ENCOUNTER — Encounter: Payer: Self-pay | Admitting: Internal Medicine

## 2022-05-25 ENCOUNTER — Other Ambulatory Visit: Payer: Self-pay

## 2022-05-25 ENCOUNTER — Other Ambulatory Visit (HOSPITAL_COMMUNITY)
Admission: RE | Admit: 2022-05-25 | Discharge: 2022-05-25 | Disposition: A | Discharge status: Home / Self Care | Payer: PPO | Source: Ambulatory Visit | Attending: Nurse Practitioner | Admitting: Nurse Practitioner

## 2022-05-25 ENCOUNTER — Ambulatory Visit (INDEPENDENT_AMBULATORY_CARE_PROVIDER_SITE_OTHER): Payer: PPO | Admitting: Nurse Practitioner

## 2022-05-25 ENCOUNTER — Encounter: Payer: Self-pay | Admitting: Nurse Practitioner

## 2022-05-25 VITALS — BP 112/78 | HR 76 | Ht 67.25 in | Wt 188.0 lb

## 2022-05-25 DIAGNOSIS — R350 Frequency of micturition: Secondary | ICD-10-CM | POA: Diagnosis not present

## 2022-05-25 DIAGNOSIS — Z8619 Personal history of other infectious and parasitic diseases: Secondary | ICD-10-CM | POA: Diagnosis not present

## 2022-05-25 DIAGNOSIS — Z124 Encounter for screening for malignant neoplasm of cervix: Secondary | ICD-10-CM

## 2022-05-25 DIAGNOSIS — N3000 Acute cystitis without hematuria: Secondary | ICD-10-CM

## 2022-05-25 DIAGNOSIS — M8580 Other specified disorders of bone density and structure, unspecified site: Secondary | ICD-10-CM

## 2022-05-25 DIAGNOSIS — Z9189 Other specified personal risk factors, not elsewhere classified: Secondary | ICD-10-CM | POA: Diagnosis not present

## 2022-05-25 DIAGNOSIS — Z79890 Hormone replacement therapy (postmenopausal): Secondary | ICD-10-CM

## 2022-05-25 DIAGNOSIS — Z01419 Encounter for gynecological examination (general) (routine) without abnormal findings: Secondary | ICD-10-CM | POA: Diagnosis not present

## 2022-05-25 DIAGNOSIS — Z1151 Encounter for screening for human papillomavirus (HPV): Secondary | ICD-10-CM | POA: Diagnosis not present

## 2022-05-25 MED ORDER — PROGESTERONE MICRONIZED 100 MG PO CAPS: 100.0000 mg | ORAL_CAPSULE | Freq: Every day | ORAL | 3 refills | Status: DC

## 2022-05-25 MED ORDER — NITROFURANTOIN MONOHYD MACRO 100 MG PO CAPS: 100.0000 mg | ORAL_CAPSULE | Freq: Two times a day (BID) | ORAL | 0 refills | Status: DC

## 2022-05-25 MED ORDER — ESTRADIOL 0.5 MG PO TABS: 0.5000 mg | ORAL_TABLET | Freq: Every day | ORAL | 3 refills | Status: DC

## 2022-05-25 NOTE — Progress Notes (Signed)
Opened in error

## 2022-05-25 NOTE — Progress Notes (Signed)
Victoria Mosley Mar 24, 1951 [id number]   History:  71 y.o. G1P1001 presents for breast and pelvic exam. Complains of urinary frequency that required her to get up multiple times overnight the last few nights. Denies dysuria, urgency, or back pain. H/O recurrent UTIs. Menopausal since mid 55s, on HRT. Has tried to wean in the past but does not tolerate. History of persistent HPV (see below). Mild osteopenia, hypothyroidism managed by PCP. Normal mammogram history.   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 Saw Dr. Denman George at [facility] who does not feel hysterectomy is best option as she would still require vaginal paps due to possibility of HPV of vulva/vagina 2019/2020 normal cytology positive HPV 2021 normal cytology negative HPV  Gynecologic History No LMP recorded. Patient is postmenopausal.   Contraception: post menopausal status Sexually active: Yes  Health Maintenance Last Pap: 05/17/2021. Results were: Normal, 3-year repeat Last mammogram: 03/02/2022. Results were: Normal Last colonoscopy: 01/29/2017. Results were: Tubular adenomas. Scheduled tomorrow Last Dexa: 03/23/2020. Results were: T-score -1.1. Scheduled 05/30/2022  Past medical history, past surgical history, family history and social history were all reviewed and documented in the EPIC chart. Married. Retired. Travels often, just went on 6.5 week road trip to [place] [employer]. 91 yo son, has 3 daughters ages 54, 63, and 72. Oldest with plans for vet school.   ROS:  A ROS was performed and pertinent positives and negatives are included.  Exam:  Vitals:   05/25/22 0922  BP: 112/78  Pulse: 76  SpO2: 97%  Weight: 188 lb (85.3 kg)  Height: 5' 7.25" (1.708 m)    Body mass index is 29.23 kg/m.  General appearance:  Normal Thyroid:  Symmetrical, normal in size, without palpable masses or  nodularity. Respiratory  Auscultation:  Clear without wheezing or rhonchi Cardiovascular  Auscultation:  Regular rate, without rubs, murmurs or gallops  Edema/varicosities:  Not grossly evident Abdominal  Soft,nontender, without masses, guarding or rebound.  Liver/spleen:  No organomegaly noted  Hernia:  None appreciated  Skin  Inspection:  Grossly normal Breasts: Examined lying and sitting.   Right: Without masses, retractions, nipple discharge or axillary adenopathy.   Left: Without masses, retractions, nipple discharge or axillary adenopathy. Genitourinary   Inguinal/mons:  Normal without inguinal adenopathy  External genitalia:  Normal appearing vulva with no masses, tenderness, or lesions  BUS/Urethra/Skene's glands:  Normal  Vagina:  Normal appearing with normal color and discharge, no lesions  Cervix:  Normal appearing without discharge or lesions. Stenotic  Uterus:  Difficult to palpate due to body habitus but no gross masses or tenderness  Adnexa/parametria:     Rt: Normal in size, without masses or tenderness.   Lt: Normal in size, without masses or tenderness.  Anus and perineum: Normal  Digital rectal exam: Deferred, colonoscopy tomorrow  Patient informed chaperone available to be present for breast and pelvic exam. Patient has requested no chaperone to be present. Patient has been advised what will be completed during breast and pelvic exam.   UA: 1+ leukocytes, positive nitrites, negative blood, negative protein, dark yellow/cloudy. Microscopic: wbc 10-20, rbc 0-2, many bacteria  Assessment/Plan:  71 y.o. G1P1001 for breast and pelvic exam.   Well female exam with routine gynecological exam - Education provided on SBEs, importance of preventative screenings, current guidelines, high calcium diet, regular exercise, and multivitamin daily. Labs with PCP.   Osteopenia,  unspecified location - Managed by PCP. T-score -1.1. She has had multiple orthopedic surgeries -  foot, bilateral hips, right knee, laminectomy. DXA scheduled 05/30/2022.  History of HPV infection - Plan: Cytology - PAP( [facility]). See HPI for pap history. 3-year repeat recommended but she would like pap today. Aware insurance may not cover.  Postmenopausal hormone therapy (HRT) - Plan: estradiol (ESTRACE) 0.5 MG tablet daily, Prometrium 100 mg daily. Has tried to wean in the past but did not tolerate. Discussed risks of blood clots, heart attack, stroke, and breast cancer with continued use. She would like to continue. Refill x 1 year provided.   Urinary frequency - Plan: Urinalysis,Complete w/RFL Culture  Acute cystitis without hematuria - Plan: nitrofurantoin, macrocrystal-monohydrate, (MACROBID) 100 MG capsule BID x 7 days. Has many allergies. Culture pending. Increase water intake.   Screening for breast cancer - Normal mammogram history.  Continue annual screenings.  Normal breast exam today.  Screening for colon cancer - 2018 colonoscopy. Colonoscopy tomorrow.   Return in 1 year for breast and pelvic exam.     [name] DNP, 10:03 AM 05/25/2022

## 2022-05-26 ENCOUNTER — Encounter: Payer: Self-pay | Admitting: Internal Medicine

## 2022-05-26 ENCOUNTER — Ambulatory Visit (AMBULATORY_SURGERY_CENTER): Payer: PPO | Admitting: Internal Medicine

## 2022-05-26 VITALS — BP 118/63 | HR 62 | Temp 98.9°F | Resp 14 | Ht 67.0 in | Wt 185.0 lb

## 2022-05-26 DIAGNOSIS — D128 Benign neoplasm of rectum: Secondary | ICD-10-CM

## 2022-05-26 DIAGNOSIS — Z09 Encounter for follow-up examination after completed treatment for conditions other than malignant neoplasm: Secondary | ICD-10-CM | POA: Diagnosis not present

## 2022-05-26 DIAGNOSIS — D122 Benign neoplasm of ascending colon: Secondary | ICD-10-CM

## 2022-05-26 DIAGNOSIS — K621 Rectal polyp: Secondary | ICD-10-CM

## 2022-05-26 DIAGNOSIS — Z86010 Personal history of colonic polyps: Secondary | ICD-10-CM

## 2022-05-26 HISTORY — PX: COLONOSCOPY: SHX174

## 2022-05-26 MED ORDER — SODIUM CHLORIDE 0.9 % IV SOLN: 500.0000 mL | Freq: Once | INTRAVENOUS | Status: DC

## 2022-05-26 NOTE — Patient Instructions (Signed)
YOU HAD AN ENDOSCOPIC PROCEDURE TODAY AT THE [facility]:   Refer to the procedure report that was given to you for any specific questions about what was found during the examination.  If the procedure report does not answer your questions, please call your gastroenterologist to clarify.  If you requested that your care partner not be given the details of your procedure findings, then the procedure report has been included in a sealed envelope for you to review at your convenience later.  YOU SHOULD EXPECT: Some feelings of bloating in the abdomen. Passage of more gas than usual.  Walking can help get rid of the air that was put into your GI tract during the procedure and reduce the bloating. If you had a lower endoscopy (such as a colonoscopy or flexible sigmoidoscopy) you may notice spotting of blood in your stool or on the toilet paper. If you underwent a bowel prep for your procedure, you may not have a normal bowel movement for a few days.  Please Note:  You might notice some irritation and congestion in your nose or some drainage.  This is from the oxygen used during your procedure.  There is no need for concern and it should clear up in a day or so.  SYMPTOMS TO REPORT IMMEDIATELY:  Following lower endoscopy (colonoscopy or flexible sigmoidoscopy):  Excessive amounts of blood in the stool  Significant tenderness or worsening of abdominal pains  Swelling of the abdomen that is new, acute  Fever of 100F or higher  Following upper endoscopy (EGD)  Vomiting of blood or coffee ground material  New chest pain or pain under the shoulder blades  Painful or persistently difficult swallowing  New shortness of breath  Fever of 100F or higher  Black, tarry-looking stools  For urgent or emergent issues, a gastroenterologist can be reached at any hour by calling [phone]. Do not use MyChart messaging for urgent concerns.    DIET:  We do recommend a small meal at first, but  then you may proceed to your regular diet.  Drink plenty of fluids but you should avoid alcoholic beverages for 24 hours.  ACTIVITY:  You should plan to take it easy for the rest of today and you should NOT DRIVE or use heavy machinery until tomorrow (because of the sedation medicines used during the test).    FOLLOW UP: Our staff will call the number listed on your records the next business day following your procedure.  We will call around 7:15- 8:00 am to check on you and address any questions or concerns that you may have regarding the information given to you following your procedure. If we do not reach you, we will leave a message.  If you develop any symptoms (ie: fever, flu-like symptoms, shortness of breath, cough etc.) before then, please call [phone].  If you test positive for Covid 19 in the 2 weeks post procedure, please call and report this information to us.    If any biopsies were taken you will be contacted by phone or by letter within the next 1-3 weeks.  Please call us at [phone] if you have not heard about the biopsies in 3 weeks.    SIGNATURES/CONFIDENTIALITY: You and/or your care partner have signed paperwork which will be entered into your electronic medical record.  These signatures attest to the fact that that the information above on your After Visit Summary has been reviewed and is understood.  Full responsibility of the confidentiality   of this discharge information lies with you and/or your care-partner.  

## 2022-05-26 NOTE — Progress Notes (Signed)
Pt's states no medical or surgical changes since previsit or office visit. 

## 2022-05-26 NOTE — Op Note (Signed)
[facility] Patient Name: Victoria Mosley Procedure Date: 05/26/2022 7:27 AM MRN: [id number] Endoscopist: Docia Chuck. Henrene Pastor , MD Age: 71 Referring MD:  Date of Birth: Jun 25, 1951 Gender: Female Account #: [id number] Procedure:                Colonoscopy with cold snare polypectomy x1; biopsy                            polypectomy x1 Indications:              High risk colon cancer surveillance: Personal                            history of multiple (3 or more) adenomas. Previous                            examinations 2004, 2008, 2013, 2018 Medicines:                Monitored Anesthesia Care Procedure:                Pre-Anesthesia Assessment:                           - Prior to the procedure, a History and Physical                            was performed, and patient medications and                            allergies were reviewed. The patient's tolerance of                            previous anesthesia was also reviewed. The risks                            and benefits of the procedure and the sedation                            options and risks were discussed with the patient.                            All questions were answered, and informed consent                            was obtained. Prior Anticoagulants: The patient has                            taken no previous anticoagulant or antiplatelet                            agents. ASA Grade Assessment: II - A patient with                            mild systemic disease. After reviewing the risks  and benefits, the patient was deemed in                            satisfactory condition to undergo the procedure.                           After obtaining informed consent, the colonoscope                            was passed under direct vision. Throughout the                            procedure, the patient's blood pressure, pulse, and                            oxygen saturations were  monitored continuously. The                            CF [id number] #[id number] was introduced through the anus                            and advanced to the the cecum, identified by                            appendiceal orifice and ileocecal valve. The                            ileocecal valve, appendiceal orifice, and rectum                            were photographed. The quality of the bowel                            preparation was excellent. The colonoscopy was                            performed without difficulty. The patient tolerated                            the procedure well. The bowel preparation used was                            SUPREP via split dose instruction. Scope In: 8:13:02 AM Scope Out: 8:30:24 AM Scope Withdrawal Time: 0 hours 12 minutes 59 seconds  Total Procedure Duration: 0 hours 17 minutes 22 seconds  Findings:                 A 5 mm polyp was found in the ascending colon. The                            polyp was removed with a cold snare. Resection and                            retrieval were complete.  A 1 mm polyp was found in the rectum. The polyp was                            removed with a jumbo cold forceps. Resection and                            retrieval were complete.                           Multiple diverticula were found in the left colon.                           External and internal hemorrhoids were found during                            retroflexion. The hemorrhoids were large. Complications:            No immediate complications. Estimated blood loss:                            None. Estimated Blood Loss:     Estimated blood loss: none. Impression:               - One 5 mm polyp in the ascending colon, removed                            with a cold snare. Resected and retrieved.                           - One 1 mm polyp in the rectum, removed with a                            jumbo cold forceps. Resected  and retrieved.                           - Diverticulosis in the left colon.                           - External and internal hemorrhoids. Recommendation:           - Repeat colonoscopy in 5 years for surveillance.                           - Patient has a contact number available for                            emergencies. The signs and symptoms of potential                            delayed complications were discussed with the                            patient. Return to normal activities tomorrow.  Written discharge instructions were provided to the                            patient.                           - Resume previous diet.                           - Continue present medications.                           - Await pathology results. Docia Chuck. Henrene Pastor, MD 05/26/2022 8:39:21 AM This report has been signed electronically.

## 2022-05-26 NOTE — Progress Notes (Signed)
Called to room to assist during endoscopic procedure.  Patient ID and intended procedure confirmed with present staff. Received instructions for my participation in the procedure from the performing physician.  

## 2022-05-26 NOTE — Progress Notes (Signed)
HISTORY OF PRESENT ILLNESS:  Victoria Mosley is a 71 y.o. female with a history of multiple adenomatous colon polyps.  Presents today for surveillance colonoscopy.  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 Denman George 04/30/2017, 2019 negative subtypes 22, 18/45   Hyperlipidemia    IBS (irritable bowel syndrome)    Kidney stones    bilateral   melanoma dx'd 2011   rt arm--surg only   Menopause    sees Dr. Phineas Real   Migraines    Nephrolithiasis    left   Neurogenic bladder    Normal cardiac stress test 2012   PONV (postoperative nausea and vomiting)    Sepsis ([place]) 01/2016   related to infection caused by kidney stone blockage    Tendonitis    right ankle   Thyroid disease    Urinary urgency    UTI (urinary tract infection)    started atb on 03-24-12    Past Surgical History:  Procedure Laterality Date   BACK SURGERY  10/1991   L5 ruptured disc- Dr.Robinson   CARPAL TUNNEL RELEASE Right 06/2018   per Dr. Alberteen Sam TUNNEL RELEASE Left 08/2018   per Dr. Amedeo Plenty   CESAREAN SECTION  12/1980   COLONOSCOPY  01/29/2017   per Dr. Henrene Pastor, adenomatous polyps,  repeat in 5 yrs   CYST REMOVAL  HAND Right 09/2021   middle finger per Dr. Amedeo Plenty   CYST REMOVAL HAND Left 12/2021   ring finger per Dr. Amedeo Plenty   CYSTOSCOPY W/ URETERAL STENT PLACEMENT Right 02/02/2017   Procedure: CYSTOSCOPY WITH RETROGRADE PYELOGRAM/URETERAL STENT PLACEMENT;  Surgeon: Irine Seal, MD;  Location: [facility];  Service: Urology;  Laterality: Right;   CYSTOSCOPY WITH RETROGRADE PYELOGRAM, URETEROSCOPY AND STENT PLACEMENT Left 12/25/2012   Procedure: CYSTOSCOPY WITH RETROGRADE PYELOGRAM, URETEROSCOPY, EXTRACTION OF  LEFT STONE WITH BASKETAND STENT PLACEMENT;  Surgeon: Molli Hazard, MD;  Location: [facility];  Service: Urology;  Laterality: Left;  2.5 HRS    CYSTOSCOPY/URETEROSCOPY/HOLMIUM LASER/STENT PLACEMENT Right 02/15/2017   Procedure: CYSTOSCOPY/URETEROSCOPY/HOLMIUM LASER/STENT EXCHANGE;  Surgeon: Ardis Hughs, MD;  Location: [facility];  Service: Urology;  Laterality: Right;   FOOT SURGERY Bilateral 1987   per Dr. Alphonzo Cruise   HEMATOMA EVACUATION  07/08/2012   Procedure: EVACUATION HEMATOMA;  Surgeon: Kristeen Miss, MD;  Location: [facility];  Service: Neurosurgery;  Laterality: N/A;  Cervical Seven-thoracic One Laminectomy and Decompression of Epidural Hematoma   HEMORRHOID BANDING     June  and August 2016, Dr Rosendo Gros   KIDNEY STONE SURGERY  12/2003   per Dr. Reece Agar   LEEP  07/2015   final pathology LGSIL with clear margins negative ECC   LITHOTRIPSY  2007   x 3/Dr. Long Hill, at L5 per Dr. Quentin Cornwall   MELANOMA EXCISION  2007   left upper arm per dr. Teressa Senter   NASAL SEPTUM SURGERY  1992   per Dr. Cynda Familia   NASAL/SINUS ENDOSCOPY  03/2016   per Dr. Melissa Montane    POSTERIOR CERVICAL LAMINECTOMY  07/08/2012   Procedure: POSTERIOR CERVICAL LAMINECTOMY;  Surgeon: Kristeen Miss, MD;  Location: [facility];  Service: Neurosurgery;  Laterality: N/A;  Cervical Seven- thoracic One Laminectomy and Decompression of Epidural Hematoma   TOTAL HIP  ARTHROPLASTY Right 11/2008   [place]   TOTAL HIP ARTHROPLASTY Left 09/27/2016   Procedure: LEFT TOTAL HIP ARTHROPLASTY ANTERIOR APPROACH;  Surgeon: Gaynelle Arabian, MD;  Location: [facility];  Service: Orthopedics;  Laterality: Left;   TOTAL KNEE ARTHROPLASTY Right 12/07/2014   Procedure: RIGHT TOTAL KNEE ARTHROPLASTY;  Surgeon: [name], MD;  Location: [facility];  Service: Orthopedics;  Laterality: Right;   TRIGGER FINGER RELEASE Left 03/2019   Thumb and middle finger per Dr. Amedeo Plenty   TUBAL LIGATION  01/1988    Social History Victoria Mosley  reports that she quit smoking about 23 years ago. Her smoking use included cigarettes. She smoked an average of .25 packs per day. She has never used smokeless tobacco. She reports current alcohol use. She reports that she does not use drugs.  family history includes Alzheimer's disease in her sister; Bladder Cancer in her sister; Colon polyps in her father; Diabetes in her mother; Heart disease in her mother and sister; Hypertension in her father, mother, and sister; Leukemia in her father; Stomach cancer in her paternal grandmother and paternal uncle.  Allergies  Allergen Reactions   Levaquin [Levofloxacin In D5w] Anaphylaxis, Rash and Other (See Comments)    Hot, dizziness, diarrhea. Tolerated Ciprofloxacin 12/2017   Pravastatin Sodium [Pravachol]     Joint pain(severe)   Septra [Sulfamethoxazole-Trimethoprim] Anaphylaxis    diarrhea   Statins     Joint Pain   Biaxin [Clarithromycin] Nausea And Vomiting   Celebrex [Celecoxib]     Severe GI upset   Ciprofloxacin Nausea Only    Severe GI upset   Codeine Nausea And Vomiting   Doxycycline     Stomach Upset    Dust Mite Extract Other (See Comments)      Grass, trees also- cause Runny nose, sneezing (allergy symptoms)   Keflex [Cephalexin] Itching    Stomach upset       PHYSICAL EXAMINATION: Vital signs: BP (!) 155/81   Pulse 64   Temp 98.9 F (37.2 C) (Skin)   Resp 13   Ht '5\' 7"'$   (1.702 m)   Wt 185 lb (83.9 kg)   SpO2 95%   BMI 28.98 kg/m  General: Well-developed, well-nourished, no acute distress HEENT: Sclerae are anicteric, conjunctiva pink. Oral mucosa intact Lungs: Clear Heart: Regular Abdomen: soft, nontender, nondistended, no obvious ascites, no peritoneal signs, normal bowel sounds. No organomegaly. Extremities: No edema Psychiatric: alert and oriented x3. Cooperative      ASSESSMENT:  Personal history of multiple adenomatous colon polyps   PLAN:  Surveillance colonoscopy

## 2022-05-26 NOTE — Progress Notes (Signed)
PT taken to PACU. Monitors in place. VSS. Report given to RN. 

## 2022-05-27 LAB — URINALYSIS, COMPLETE W/RFL CULTURE
Bilirubin Urine: NEGATIVE
Glucose, UA: NEGATIVE
Hgb urine dipstick: NEGATIVE
Hyaline Cast: NONE SEEN /LPF
Ketones, ur: NEGATIVE
Nitrites, Initial: POSITIVE — AB
Protein, ur: NEGATIVE
Specific Gravity, Urine: 1.02 (ref 1.001–1.035)
pH: 6 (ref 5.0–8.0)

## 2022-05-27 LAB — URINE CULTURE
MICRO NUMBER:: 13702641
SPECIMEN QUALITY:: ADEQUATE

## 2022-05-27 LAB — CULTURE INDICATED

## 2022-05-29 ENCOUNTER — Telehealth: Payer: Self-pay | Admitting: *Deleted

## 2022-05-29 LAB — CYTOLOGY - PAP
Comment: NEGATIVE
Diagnosis: NEGATIVE
High risk HPV: NEGATIVE

## 2022-05-29 NOTE — Telephone Encounter (Signed)
  Follow up Call-     05/26/2022    7:21 AM  Call back number  Post procedure Call Back phone  # [phone]  Permission to leave phone message Yes     Patient questions:  Do you have a fever, pain , or abdominal swelling? No. Pain Score  0 *  Have you tolerated food without any problems? Yes.    Have you been able to return to your normal activities? Yes.    Do you have any questions about your discharge instructions: Diet   No. Medications  No. Follow up visit  No.  Do you have questions or concerns about your Care? No.  Actions: * If pain score is 4 or above: No action needed, pain <4.

## 2022-05-30 ENCOUNTER — Ambulatory Visit (INDEPENDENT_AMBULATORY_CARE_PROVIDER_SITE_OTHER)
Admission: RE | Admit: 2022-05-30 | Discharge: 2022-05-30 | Disposition: A | Discharge status: Home / Self Care | Payer: PPO | Source: Ambulatory Visit

## 2022-05-30 DIAGNOSIS — M810 Age-related osteoporosis without current pathological fracture: Secondary | ICD-10-CM

## 2022-05-31 ENCOUNTER — Encounter: Payer: Self-pay | Admitting: Internal Medicine

## 2022-07-06 ENCOUNTER — Ambulatory Visit (INDEPENDENT_AMBULATORY_CARE_PROVIDER_SITE_OTHER): Payer: PPO

## 2022-07-06 DIAGNOSIS — Z23 Encounter for immunization: Secondary | ICD-10-CM

## 2022-07-21 ENCOUNTER — Telehealth: Payer: Self-pay | Admitting: Family Medicine

## 2022-07-21 NOTE — Telephone Encounter (Signed)
Yes she should get both the newest Covid vaccine and RSV

## 2022-07-21 NOTE — Telephone Encounter (Signed)
Spoke with patient message given.

## 2022-07-21 NOTE — Telephone Encounter (Signed)
Patient would like to know if she should get the newest covid vaccine, as well as the Rsv vaccine. Patient has gotten her flu vaccine and the six previous covid vaccines, but they travel a lot and are about to go on another cruise.      Please advise

## 2022-08-23 ENCOUNTER — Encounter: Payer: Self-pay | Admitting: Family Medicine

## 2022-08-23 ENCOUNTER — Ambulatory Visit (INDEPENDENT_AMBULATORY_CARE_PROVIDER_SITE_OTHER): Payer: PPO | Admitting: Family Medicine

## 2022-08-23 VITALS — Ht 67.0 in | Wt 185.0 lb

## 2022-08-23 DIAGNOSIS — J029 Acute pharyngitis, unspecified: Secondary | ICD-10-CM | POA: Diagnosis not present

## 2022-08-23 LAB — POCT RAPID STREP A (OFFICE): Rapid Strep A Screen: NEGATIVE

## 2022-08-24 NOTE — Progress Notes (Signed)
Lab only.   Husband was seen with strep throat and pt requested rapid strep- which came back negative.  Pt is asymptomatic.     Reassurance  Eulas Post MD [facility]

## 2022-08-30 DIAGNOSIS — M7542 Impingement syndrome of left shoulder: Secondary | ICD-10-CM | POA: Diagnosis not present

## 2022-08-30 DIAGNOSIS — M25512 Pain in left shoulder: Secondary | ICD-10-CM | POA: Diagnosis not present

## 2022-08-31 ENCOUNTER — Ambulatory Visit (INDEPENDENT_AMBULATORY_CARE_PROVIDER_SITE_OTHER): Payer: PPO

## 2022-08-31 VITALS — Ht 67.0 in | Wt 185.0 lb

## 2022-08-31 DIAGNOSIS — Z0000 Encounter for general adult medical examination without abnormal findings: Secondary | ICD-10-CM | POA: Diagnosis not present

## 2022-08-31 NOTE — Patient Instructions (Addendum)
Victoria Mosley , Thank you for taking time to come for your Medicare Wellness Visit. I appreciate your ongoing commitment to your health goals. Please review the following plan we discussed and let me know if I can assist you in the future.   These are the goals we discussed:  Goals       Patient Stated (pt-stated)      Stay as healthy as possible.       Patient Stated      I would like to continue to travel. I have 15 more states to visit         This is a list of the screening recommended for you and due dates:  Health Maintenance  Topic Date Due   COVID-19 Vaccine (7 - Pfizer risk series) 09/16/2022*   Mammogram  03/03/2023   Medicare Annual Wellness Visit  09/01/2023   Colon Cancer Screening  05/27/2027   Tetanus Vaccine  01/26/2032   Pneumonia Vaccine  Completed   Flu Shot  Completed   DEXA scan (bone density measurement)  Completed   Hepatitis C Screening: USPSTF Recommendation to screen - Ages 79-79 yo.  Completed   Zoster (Shingles) Vaccine  Completed   HPV Vaccine  Aged Out  *Topic was postponed. The date shown is not the original due date.  Opioid Pain Medicine Management Opioids are powerful medicines that are used to treat moderate to severe pain. When used for short periods of time, they can help you to: Sleep better. Do better in physical or occupational therapy. Feel better in the first few days after an injury. Recover from surgery. Opioids should be taken with the supervision of a trained health care provider. They should be taken for the shortest period of time possible. This is because opioids can be addictive, and the longer you take opioids, the greater your risk of addiction. This addiction can also be called opioid use disorder. What are the risks? Using opioid pain medicines for longer than 3 days increases your risk of side effects. Side effects include: Constipation. Nausea and vomiting. Breathing difficulties (respiratory  depression). Drowsiness. Confusion. Opioid use disorder. Itching. Taking opioid pain medicine for a long period of time can affect your ability to do daily tasks. It also puts you at risk for: Motor vehicle crashes. Depression. Suicide. Heart attack. Overdose, which can be life-threatening. What is a pain treatment plan? A pain treatment plan is an agreement between you and your health care provider. Pain is unique to each person, and treatments vary depending on your condition. To manage your pain, you and your health care provider need to work together. To help you do this: Discuss the goals of your treatment, including how much pain you might expect to have and how you will manage the pain. Review the risks and benefits of taking opioid medicines. Remember that a good treatment plan uses more than one approach and minimizes the chance of side effects. Be honest about the amount of medicines you take and about any drug or alcohol use. Get pain medicine prescriptions from only one health care provider. Pain can be managed with many types of alternative treatments. Ask your health care provider to refer you to one or more specialists who can help you manage pain through: Physical or occupational therapy. Counseling (cognitive behavioral therapy). Good nutrition. Biofeedback. Massage. Meditation. Non-opioid medicine. Following a gentle exercise program. How to use opioid pain medicine Taking medicine Take your pain medicine exactly as told by your health care  provider. Take it only when you need it. If your pain gets less severe, you may take less than your prescribed dose if your health care provider approves. If you are not having pain, do nottake pain medicine unless your health care provider tells you to take it. If your pain is severe, do nottry to treat it yourself by taking more pills than instructed on your prescription. Contact your health care provider for help. Write down  the times when you take your pain medicine. It is easy to become confused while on pain medicine. Writing the time can help you avoid overdose. Take other over-the-counter or prescription medicines only as told by your health care provider. Keeping yourself and others safe  While you are taking opioid pain medicine: Do not drive, use machinery, or power tools. Do not sign legal documents. Do not drink alcohol. Do not take sleeping pills. Do not supervise children by yourself. Do not do activities that require climbing or being in high places. Do not go to a lake, river, ocean, spa, or swimming pool. Do not share your pain medicine with anyone. Keep pain medicine in a locked cabinet or in a secure area where pets and children cannot reach it. Stopping your use of opioids If you have been taking opioid medicine for more than a few weeks, you may need to slowly decrease (taper) how much you take until you stop completely. Tapering your use of opioids can decrease your risk of symptoms of withdrawal, such as: Pain and cramping in the abdomen. Nausea. Sweating. Sleepiness. Restlessness. Uncontrollable shaking (tremors). Cravings for the medicine. Do not attempt to taper your use of opioids on your own. Talk with your health care provider about how to do this. Your health care provider may prescribe a step-down schedule based on how much medicine you are taking and how long you have been taking it. Getting rid of leftover pills Do not save any leftover pills. Get rid of leftover pills safely by: Taking the medicine to a prescription take-back program. This is usually offered by the county or law enforcement. Bringing them to a pharmacy that has a drug disposal container. Flushing them down the toilet. Check the label or package insert of your medicine to see whether this is safe to do. Throwing them out in the trash. Check the label or package insert of your medicine to see whether this is  safe to do. If it is safe to throw it out, remove the medicine from the original container, put it into a sealable bag or container, and mix it with used coffee grounds, food scraps, dirt, or cat litter before putting it in the trash. Follow these instructions at home: Activity Do exercises as told by your health care provider. Avoid activities that make your pain worse. Return to your normal activities as told by your health care provider. Ask your health care provider what activities are safe for you. General instructions You may need to take these actions to prevent or treat constipation: Drink enough fluid to keep your urine pale yellow. Take over-the-counter or prescription medicines. Eat foods that are high in fiber, such as beans, whole grains, and fresh fruits and vegetables. Limit foods that are high in fat and processed sugars, such as fried or sweet foods. Keep all follow-up visits. This is important. Where to find support If you have been taking opioids for a long time, you may benefit from receiving support for quitting from a local support group or counselor.  Ask your health care provider for a referral to these resources in your area. Where to find more information Centers for Disease Control and Prevention (CDC): http://www.wolf.info/ [place] Food and Drug Administration (FDA): [place] Get help right away if: You may have taken too much of an opioid (overdosed). Common symptoms of an overdose: Your breathing is slower or more shallow than normal. You have a very slow heartbeat (pulse). You have slurred speech. You have nausea and vomiting. Your pupils become very small. You have other potential symptoms: You are very confused. You faint or feel like you will faint. You have cold, clammy skin. You have blue lips or fingernails. You have thoughts of harming yourself or harming others. These symptoms may represent a serious problem that is an emergency. Do not wait to see if the  symptoms will go away. Get medical help right away. Call your local emergency services (911 in the [place] Do not drive yourself to the hospital.  If you ever feel like you may hurt yourself or others, or have thoughts about taking your own life, get help right away. Go to your nearest emergency department or: Call your local emergency services (911 in the [place] Call the [facility] 701-860-8147 in the [place] Call a suicide crisis helpline, such as the [place] at (239)808-0594 or 988 in the [place]. This is open 24 hours a day in the [place] Text the Crisis Text Line at 367-512-1633 (in the [place].). Summary Opioid medicines can help you manage moderate to severe pain for a short period of time. A pain treatment plan is an agreement between you and your health care provider. Discuss the goals of your treatment, including how much pain you might expect to have and how you will manage the pain. If you think that you or someone else may have taken too much of an opioid, get medical help right away. This information is not intended to replace advice given to you by your health care provider. Make sure you discuss any questions you have with your health care provider. Document Revised: 05/11/2021 Document Reviewed: 01/26/2021 Elsevier Patient Education  [place] directives: Please bring a copy of your health care power of attorney and living will to the office to be added to your chart at your convenience.   Conditions/risks identified: None  Next appointment: Follow up in one year for your annual wellness visit     Preventive Care 65 Years and Older, Female Preventive care refers to lifestyle choices and visits with your health care provider that can promote health and wellness. What does preventive care include? A yearly physical exam. This is also called an annual well check. Dental exams once or twice a year. Routine eye exams. Ask  your health care provider how often you should have your eyes checked. Personal lifestyle choices, including: Daily care of your teeth and gums. Regular physical activity. Eating a healthy diet. Avoiding tobacco and drug use. Limiting alcohol use. Practicing safe sex. Taking low-dose aspirin every day. Taking vitamin and mineral supplements as recommended by your health care provider. What happens during an annual well check? The services and screenings done by your health care provider during your annual well check will depend on your age, overall health, lifestyle risk factors, and family history of disease. Counseling  Your health care provider may ask you questions about your: Alcohol use. Tobacco use. Drug use. Emotional well-being. Home and relationship well-being. Sexual activity. Eating habits.  History of falls. Memory and ability to understand (cognition). Work and work Statistician. Reproductive health. Screening  You may have the following tests or measurements: Height, weight, and BMI. Blood pressure. Lipid and cholesterol levels. These may be checked every 5 years, or more frequently if you are over 20 years old. Skin check. Lung cancer screening. You may have this screening every year starting at age 15 if you have a 30-pack-year history of smoking and currently smoke or have quit within the past 15 years. Fecal occult blood test (FOBT) of the stool. You may have this test every year starting at age 63. Flexible sigmoidoscopy or colonoscopy. You may have a sigmoidoscopy every 5 years or a colonoscopy every 10 years starting at age 64. Hepatitis C blood test. Hepatitis B blood test. Sexually transmitted disease (STD) testing. Diabetes screening. This is done by checking your blood sugar (glucose) after you have not eaten for a while (fasting). You may have this done every 1-3 years. Bone density scan. This is done to screen for osteoporosis. You may have this done  starting at age 69. Mammogram. This may be done every 1-2 years. Talk to your health care provider about how often you should have regular mammograms. Talk with your health care provider about your test results, treatment options, and if necessary, the need for more tests. Vaccines  Your health care provider may recommend certain vaccines, such as: Influenza vaccine. This is recommended every year. Tetanus, diphtheria, and acellular pertussis (Tdap, Td) vaccine. You may need a Td booster every 10 years. Zoster vaccine. You may need this after age 45. Pneumococcal 13-valent conjugate (PCV13) vaccine. One dose is recommended after age 24. Pneumococcal polysaccharide (PPSV23) vaccine. One dose is recommended after age 68. Talk to your health care provider about which screenings and vaccines you need and how often you need them. This information is not intended to replace advice given to you by your health care provider. Make sure you discuss any questions you have with your health care provider. Document Released: 11/12/2015 Document Revised: 07/05/2016 Document Reviewed: 08/17/2015 Elsevier Interactive Patient Education  2017 [place] Prevention in the Home Falls can cause injuries. They can happen to people of all ages. There are many things you can do to make your home safe and to help prevent falls. What can I do on the outside of my home? Regularly fix the edges of walkways and driveways and fix any cracks. Remove anything that might make you trip as you walk through a door, such as a raised step or threshold. Trim any bushes or trees on the path to your home. Use bright outdoor lighting. Clear any walking paths of anything that might make someone trip, such as rocks or tools. Regularly check to see if handrails are loose or broken. Make sure that both sides of any steps have handrails. Any raised decks and porches should have guardrails on the edges. Have any leaves, snow, or  ice cleared regularly. Use sand or salt on walking paths during winter. Clean up any spills in your garage right away. This includes oil or grease spills. What can I do in the bathroom? Use night lights. Install grab bars by the toilet and in the tub and shower. Do not use towel bars as grab bars. Use non-skid mats or decals in the tub or shower. If you need to sit down in the shower, use a plastic, non-slip stool. Keep the floor dry. Clean up any water that spills  on the floor as soon as it happens. Remove soap buildup in the tub or shower regularly. Attach bath mats securely with double-sided non-slip rug tape. Do not have throw rugs and other things on the floor that can make you trip. What can I do in the bedroom? Use night lights. Make sure that you have a light by your bed that is easy to reach. Do not use any sheets or blankets that are too big for your bed. They should not hang down onto the floor. Have a firm chair that has side arms. You can use this for support while you get dressed. Do not have throw rugs and other things on the floor that can make you trip. What can I do in the kitchen? Clean up any spills right away. Avoid walking on wet floors. Keep items that you use a lot in easy-to-reach places. If you need to reach something above you, use a strong step stool that has a grab bar. Keep electrical cords out of the way. Do not use floor polish or wax that makes floors slippery. If you must use wax, use non-skid floor wax. Do not have throw rugs and other things on the floor that can make you trip. What can I do with my stairs? Do not leave any items on the stairs. Make sure that there are handrails on both sides of the stairs and use them. Fix handrails that are broken or loose. Make sure that handrails are as long as the stairways. Check any carpeting to make sure that it is firmly attached to the stairs. Fix any carpet that is loose or worn. Avoid having throw rugs at  the top or bottom of the stairs. If you do have throw rugs, attach them to the floor with carpet tape. Make sure that you have a light switch at the top of the stairs and the bottom of the stairs. If you do not have them, ask someone to add them for you. What else can I do to help prevent falls? Wear shoes that: Do not have high heels. Have rubber bottoms. Are comfortable and fit you well. Are closed at the toe. Do not wear sandals. If you use a stepladder: Make sure that it is fully opened. Do not climb a closed stepladder. Make sure that both sides of the stepladder are locked into place. Ask someone to hold it for you, if possible. Clearly mark and make sure that you can see: Any grab bars or handrails. First and last steps. Where the edge of each step is. Use tools that help you move around (mobility aids) if they are needed. These include: Canes. Walkers. Scooters. Crutches. Turn on the lights when you go into a dark area. Replace any light bulbs as soon as they burn out. Set up your furniture so you have a clear path. Avoid moving your furniture around. If any of your floors are uneven, fix them. If there are any pets around you, be aware of where they are. Review your medicines with your doctor. Some medicines can make you feel dizzy. This can increase your chance of falling. Ask your doctor what other things that you can do to help prevent falls. This information is not intended to replace advice given to you by your health care provider. Make sure you discuss any questions you have with your health care provider. Document Released: 08/12/2009 Document Revised: 03/23/2016 Document Reviewed: 11/20/2014 Elsevier Interactive Patient Education  2017 [employer].

## 2022-08-31 NOTE — Progress Notes (Signed)
Subjective:   Victoria Mosley is a 71 y.o. female who presents for Medicare Annual (Subsequent) preventive examination.  Review of Systems    Virtual Visit via Telephone Note  I connected with  [name] on 08/31/22 at  9:15 AM EDT by telephone and verified that I am speaking with the correct person using two identifiers.  Location: Patient: Home Provider: Office Persons participating in the virtual visit: patient/Nurse Health Advisor   I discussed the limitations, risks, security and privacy concerns of performing an evaluation and management service by telephone and the availability of in person appointments. The patient expressed understanding and agreed to proceed.  Interactive audio and video telecommunications were attempted between this nurse and patient, however failed, due to patient having technical difficulties OR patient did not have access to video capability.  We continued and completed visit with audio only.  Some vital signs may be absent or patient reported.   Criselda Peaches, LPN  Cardiac Risk Factors include: advanced age (>77mn, >>91women)     Objective:    Today's Vitals   08/31/22 0918  Weight: 185 lb (83.9 kg)  Height: '5\' 7"'$  (1.702 m)   Body mass index is 28.98 kg/m.     08/31/2022    9:29 AM 08/29/2021    8:12 AM 09/06/2020   10:16 AM 08/15/2018    5:27 PM 04/30/2017   11:58 AM 02/15/2017    6:27 AM 02/13/2017    9:27 AM  Advanced Directives  Does Patient Have a Medical Advance Directive? Yes Yes Yes Yes No No No  Type of AParamedicof [place]Living will [place] will [place] will      Does patient want to make changes to medical advance directive?   No - Patient declined      Copy of HDodgein Chart? No - copy requested No - copy requested No - copy requested      Would patient like information on creating a medical advance directive?      No - Patient declined No - Patient declined No - Patient declined    Current Medications (verified) Outpatient Encounter Medications as of 08/31/2022  Medication Sig   Acetaminophen (TYLENOL PO) Take by mouth daily as needed.   amoxicillin (AMOXIL) 500 MG capsule amoxicillin 500 mg capsule  TAKE 4 CAPSULES BY MOUTH 1 HOUR BEFORE DENTAL APPOINTMENT FOR PREMED   Ascorbic Acid (VITAMIN C) 1000 MG tablet Take 1,000 mg by mouth daily.   B Complex Vitamins (VITAMIN B COMPLEX PO) Take by mouth.   betamethasone valerate lotion (VALISONE) 0.1 % Apply topically as needed.   CALCIUM CITRATE PO Take 1,000 mg by mouth at bedtime.   Cranberry 250 MG CAPS Take 500 mg by mouth 2 (two) times daily.   Cyanocobalamin (VITAMIN B 12 PO) Take 500 mcg by mouth daily.   DOCUSATE SODIUM PO Take 100 mg by mouth as needed. Takes Monday, Wednesday, Friday, Sunday   estradiol (ESTRACE) 0.5 MG tablet Take 1 tablet (0.5 mg total) by mouth daily.   ezetimibe (ZETIA) 10 MG tablet Take 1 tablet (10 mg total) by mouth daily.   FIORICET 50-300-40 MG CAPS TAKE ONE CAPSULE TWICE A DAY AS NEEDED FOR HEADACHE   LACTOBACILLUS PO Take 5 mg by mouth daily.   levothyroxine (SYNTHROID) 75 MCG tablet Take 1 tablet (75 mcg total) by mouth daily.   loratadine (CLARITIN) 10 MG tablet Take 10  mg by mouth daily.   methenamine (HIPREX) 1 g tablet Take 1 g by mouth 2 (two) times daily with a meal.   Multiple Vitamins-Minerals (WOMENS 50+ MULTI VITAMIN/MIN PO) Take 1 tablet by mouth daily.   naproxen sodium (ANAPROX) 220 MG tablet Take 220 mg by mouth daily.    omeprazole (PRILOSEC) 20 MG capsule Take 1 capsule (20 mg total) by mouth daily.   POTASSIUM PO Take 595 mg by mouth at bedtime.   predniSONE (DELTASONE) 5 MG tablet Take 1 tablet (5 mg total) by mouth daily with breakfast.   prochlorperazine (COMPAZINE) 10 MG tablet TAKE 1 TABLET BY MOUTH EVERY 6 HOURS AS NEEDED FOR NAUSEA OR  VOMITING   progesterone (PROMETRIUM) 100 MG capsule  Take 1 capsule (100 mg total) by mouth daily.   propranolol (INDERAL) 40 MG tablet Take 1 tablet (40 mg total) by mouth daily.   Pyridoxine HCl (VITAMIN B-6 PO) Take 100 mg by mouth at bedtime.   TOBRAMYCIN OP 80 mg. EMPTY 1 CAPSULE IN SINUS RINSE BOTTLE AND USE TWICE DAILY   traMADol (ULTRAM) 50 MG tablet TAKE 2 TABLETS BY MOUTH EVERY 6 HOURS AS NEEDED FOR MODERATE PAIN   UNABLE TO FIND as needed. Med Name: Hydrocortisone Lotion USP 2.5%   No facility-administered encounter medications on file as of 08/31/2022.    Allergies (verified) Levaquin [levofloxacin in d5w], Pravastatin sodium [pravachol], Septra [sulfamethoxazole-trimethoprim], Statins, Biaxin [clarithromycin], Celebrex [celecoxib], Ciprofloxacin, Codeine, Doxycycline, Dust mite extract, and Keflex [cephalexin]   History: Past Medical History:  Diagnosis Date   Acne    and granuloma annulare, sees Dr. Allyson Sabal    Adenomatous colon polyp 2004   Allergy    Anemia    iron  transfusions   Arthritis    Bilateral knee pain    sees Dr. 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 Denman George 04/30/2017, 2019 negative subtypes 16, 18/45   Hyperlipidemia    IBS (irritable bowel syndrome)    Kidney stones    bilateral   melanoma dx'd 2011   rt arm--surg only   Menopause    sees Dr. Phineas Real   Migraines    Nephrolithiasis    left   Neurogenic bladder    Normal cardiac stress test 2012   PONV (postoperative nausea and vomiting)     Sepsis ([place]) 01/2016   related to infection caused by kidney stone blockage    Tendonitis    right ankle   Thyroid disease    Urinary urgency    UTI (urinary tract infection)    started atb on 03-24-12   Past Surgical History:  Procedure Laterality Date   BACK SURGERY  10/1991   L5 ruptured disc- Dr.Robinson   CARPAL TUNNEL RELEASE Right 06/2018   per Dr. Alberteen Sam TUNNEL RELEASE Left 08/2018   per Dr. Amedeo Plenty   CESAREAN SECTION  12/1980   COLONOSCOPY  01/29/2017   per Dr. Henrene Pastor, adenomatous polyps,  repeat in 5 yrs   CYST REMOVAL HAND Right 09/2021   middle finger per Dr. Amedeo Plenty   CYST REMOVAL HAND Left 12/2021   ring finger per Dr. Amedeo Plenty   CYSTOSCOPY W/ URETERAL STENT PLACEMENT Right 02/02/2017   Procedure: CYSTOSCOPY WITH RETROGRADE PYELOGRAM/URETERAL STENT PLACEMENT;  Surgeon: Irine Seal, MD;  Location: [facility];  Service: Urology;  Laterality: Right;   CYSTOSCOPY WITH RETROGRADE PYELOGRAM, URETEROSCOPY AND STENT PLACEMENT Left 12/25/2012   Procedure: CYSTOSCOPY WITH RETROGRADE PYELOGRAM, URETEROSCOPY, EXTRACTION OF  LEFT STONE WITH BASKETAND STENT PLACEMENT;  Surgeon: Molli Hazard, MD;  Location: [facility];  Service: Urology;  Laterality: Left;  2.5 HRS    CYSTOSCOPY/URETEROSCOPY/HOLMIUM LASER/STENT PLACEMENT Right 02/15/2017   Procedure: CYSTOSCOPY/URETEROSCOPY/HOLMIUM LASER/STENT EXCHANGE;  Surgeon: Ardis Hughs, MD;  Location: [facility];  Service: Urology;  Laterality: Right;   FOOT SURGERY Bilateral 1987   per Dr. Alphonzo Cruise   HEMATOMA EVACUATION  07/08/2012   Procedure: EVACUATION HEMATOMA;  Surgeon: Kristeen Miss, MD;  Location: [place] [facility];  Service: Neurosurgery;  Laterality: N/A;  Cervical Seven-thoracic One Laminectomy and Decompression of Epidural Hematoma   HEMORRHOID BANDING     June and August 2016, Dr Rosendo Gros   KIDNEY STONE SURGERY  12/2003   per Dr. Reece Agar   LEEP  07/2015   final pathology LGSIL with clear margins negative  ECC   LITHOTRIPSY  2007   x 3/Dr. Del Muerto, at L5 per Dr. Quentin Cornwall   MELANOMA EXCISION  2007   left upper arm per dr. Teressa Senter   NASAL SEPTUM SURGERY  1992   per Dr. Cynda Familia   NASAL/SINUS ENDOSCOPY  03/2016   per Dr. Melissa Montane    POSTERIOR CERVICAL LAMINECTOMY  07/08/2012   Procedure: POSTERIOR CERVICAL LAMINECTOMY;  Surgeon: Kristeen Miss, MD;  Location: [place];  Service: Neurosurgery;  Laterality: N/A;  Cervical Seven- thoracic One Laminectomy and Decompression of Epidural Hematoma   TOTAL HIP ARTHROPLASTY Right 11/2008   [place]   TOTAL HIP ARTHROPLASTY Left 09/27/2016   Procedure: LEFT TOTAL HIP ARTHROPLASTY ANTERIOR APPROACH;  Surgeon: Gaynelle Arabian, MD;  Location: [facility];  Service: Orthopedics;  Laterality: Left;   TOTAL KNEE ARTHROPLASTY Right 12/07/2014   Procedure: RIGHT TOTAL KNEE ARTHROPLASTY;  Surgeon: [name], MD;  Location: [facility];  Service: Orthopedics;  Laterality: Right;   TRIGGER FINGER RELEASE Left 03/2019   Thumb and middle finger per Dr. Amedeo Plenty   TUBAL LIGATION  01/1988   Family History  Problem Relation Age of Onset   Heart disease Mother    Hypertension Mother    Diabetes Mother    Colon polyps Father    Hypertension Father    Leukemia Father    Hypertension Sister    Heart disease Sister    Bladder Cancer Sister    Alzheimer's disease Sister    Stomach cancer Paternal Uncle    Stomach cancer Paternal Grandmother    Colon cancer Neg Hx    Esophageal cancer Neg Hx    Rectal cancer Neg Hx    Social History   Socioeconomic History   Marital status: Married    Spouse name: Remo Lipps   Number of children: 1   Years of education: Not on file   Highest education level: Not on file  Occupational History   Occupation: Counselling psychologist  Tobacco Use   Smoking  status: Former    Packs/day: 0.25    Types: Cigarettes    Quit date: 12/20/1998    Years since quitting: 23.7   Smokeless tobacco: Never  Vaping  Use   Vaping Use: Never used  Substance and Sexual Activity   Alcohol use: Yes    Comment: Very rare   Drug use: No   Sexual activity: Yes    Partners: Male    Birth control/protection: Post-menopausal    Comment: 1st intercourse 9 yo-2 partners  Other Topics Concern   Not on file  Social History Narrative   Not on file   Social Determinants of Health   Financial Resource Strain: Low Risk  (08/31/2022)   Overall Financial Resource Strain (CARDIA)    Difficulty of Paying Living Expenses: Not hard at all  Food Insecurity: No Food Insecurity (08/31/2022)   Hunger Vital Sign    Worried About Running Out of Food in the Last Year: Never true    [place] in the Last Year: Never true  Transportation Needs: No Transportation Needs (08/31/2022)   PRAPARE - Hydrologist (Medical): No    Lack of Transportation (Non-Medical): No  Physical Activity: Inactive (08/31/2022)   Exercise Vital Sign    Days of Exercise per Week: 0 days    Minutes of Exercise per Session: 0 min  Stress: No Stress Concern Present (08/31/2022)   [place]    Feeling of Stress : Not at all  Social Connections: [place] (08/31/2022)   Social Connection and Isolation Panel [NHANES]    Frequency of Communication with Friends and Family: More than three times a week    Frequency of Social Gatherings with Friends and Family: More than three times a week    Attends Religious Services: More than 4 times per year    Active Member of [employer] or Organizations: Yes    Attends Music therapist: More than 4 times per year    Marital Status: Married    Tobacco Counseling Counseling given: Not Answered   Clinical Intake:  Pre-visit preparation completed: No  Pain : No/denies pain     BMI - recorded: 28.98 Nutritional Status: BMI 25 -29 Overweight Nutritional Risks: None Diabetes: No  How often  do you need to have someone help you when you read instructions, pamphlets, or other written materials from your doctor or pharmacy?: 1 - Never  Diabetic?  No  Interpreter Needed?: No  Information entered by :: Rolene Arbour LPN   Activities of Daily Living    08/31/2022    9:28 AM  In your present state of health, do you have any difficulty performing the following activities:  Hearing? 0  Vision? 0  Difficulty concentrating or making decisions? 0  Walking or climbing stairs? 0  Dressing or bathing? 0  Doing errands, shopping? 0  Preparing Food and eating ? N  Using the Toilet? N  In the past six months, have you accidently leaked urine? N  Do you have problems with loss of bowel control? N  Managing your Medications? N  Managing your Finances? N  Housekeeping or managing your Housekeeping? N    Patient Care Team: Laurey Morale, MD as PCP - General Louis Meckel Viona Gilmore, MD as Attending Physician (Urology) Fontaine, Belinda Block, MD (Inactive) as Consulting Physician (Gynecology) Irene Shipper, MD as Consulting Physician (Gastroenterology) [name], MD as Consulting Physician (Orthopedic Surgery) [place],  Pilar Plate, MD as Consulting Physician (Orthopedic Surgery) Marijean Niemann (Dermatology) Viona Gilmore, [facility] as Pharmacist (Pharmacist)  Indicate any recent Medical Services you may have received from other than [facility] providers in the past year (date may be approximate).     Assessment:   This is a routine wellness examination for [place].  Hearing/Vision screen Hearing Screening - Comments:: Denies hearing difficulties   Vision Screening - Comments:: Wears rx glasses - up to date with routine eye exams with  Dr Prudencio Burly  Dietary issues and exercise activities discussed: Exercise limited by: None identified   Goals Addressed               This Visit's Progress     Patient Stated (pt-stated)        Stay as healthy as possible.        Depression  Screen    08/31/2022    9:27 AM 08/29/2021    8:13 AM 08/29/2021    8:09 AM 09/06/2020   10:21 AM 11/12/2019    2:15 PM 08/26/2019    2:16 PM 11/20/2018    8:49 AM  PHQ 2/9 Scores  PHQ - 2 Score 0 0 0 0 1 0 0  PHQ- 9 Score    0       Fall Risk    08/31/2022    9:28 AM 08/29/2021    8:13 AM 08/25/2021    7:45 PM 09/06/2020   10:19 AM 11/12/2019    2:15 PM  Fall Risk   Falls in the past year? 0 0 0 1 0  Number falls in past yr: 0 0  0   Injury with Fall? 0 0  0   Risk for fall due to : No Fall Risks   Impaired balance/gait   Follow up Falls prevention discussed Falls evaluation completed  Falls evaluation completed;Falls prevention discussed Falls evaluation completed    FALL RISK PREVENTION PERTAINING TO THE HOME:  Any stairs in or around the home? No  If so, are there any without handrails? No  Home free of loose throw rugs in walkways, pet beds, electrical cords, etc? Yes  Adequate lighting in your home to reduce risk of falls? Yes   ASSISTIVE DEVICES UTILIZED TO PREVENT FALLS:  Life alert? No  Use of a cane, walker or w/c? No  Grab bars in the bathroom? No  Shower chair or bench in shower? Yes  Elevated toilet seat or a handicapped toilet? No   TIMED UP AND GO:  Was the test performed? No . Audio Visit    Cognitive Function:        08/31/2022    9:29 AM 09/06/2020   10:25 AM  6CIT Screen  What Year? 0 points 0 points  What month? 0 points 0 points  What time? 0 points 0 points  Count back from 20 0 points 0 points  Months in reverse 0 points 0 points  Repeat phrase 0 points 0 points  Total Score 0 points 0 points    Immunizations Immunization History  Administered Date(s) Administered   Fluad Quad(high Dose 65+) 08/25/2019, 08/26/2020, 08/08/2021, 07/06/2022   Influenza Split 07/11/2012   Influenza Whole 08/19/2010   Influenza, High Dose Seasonal PF 07/26/2017, 08/15/2018   Influenza,inj,quad, With Preservative 07/26/2017   Influenza-Unspecified  07/28/2015, 07/26/2016   PFIZER(Purple Top)SARS-COV-2 Vaccination 11/24/2019, 12/15/2019, 07/27/2020, 02/28/2021   Pfizer Covid-19 Vaccine Bivalent Booster 5yr & up 08/03/2021, 03/03/2022   Pneumococcal Conjugate-13 07/29/2015   Pneumococcal Polysaccharide-23  05/28/2012, 08/15/2018   Td 10/30/2001   Tdap 05/28/2012, 01/25/2022   Zoster Recombinat (Shingrix) 07/28/2021, 11/30/2021   Zoster, Live 05/28/2012    TDAP status: Up to date  Flu Vaccine status: Up to date  Pneumococcal vaccine status: Up to date  Covid-19 vaccine status: Completed vaccines  Qualifies for Shingles Vaccine? Yes   Zostavax completed Yes   Shingrix Completed?: Yes  Screening Tests Health Maintenance  Topic Date Due   COVID-19 Vaccine (7 - Pfizer risk series) 09/16/2022 (Originally 04/28/2022)   MAMMOGRAM  03/03/2023   Medicare Annual Wellness ([place])  09/01/2023   COLONOSCOPY (Pts 45-86yr Insurance coverage will need to be confirmed)  05/27/2027   TETANUS/TDAP  01/26/2032   Pneumonia Vaccine 70 Years old  Completed   INFLUENZA VACCINE  Completed   DEXA SCAN  Completed   Hepatitis C Screening  Completed   Zoster Vaccines- Shingrix  Completed   HPV VACCINES  Aged Out    Health Maintenance  There are no preventive care reminders to display for this patient.   Colorectal cancer screening: Type of screening: Colonoscopy. Completed 05/26/22. Repeat every 5 years  Mammogram status: Completed 03/02/22. Repeat every year  Bone Density status: Completed 05/30/22. Results reflect: Bone density results: OSTEOPOROSIS. Repeat every   years.  Lung Cancer Screening: (Low Dose CT Chest recommended if Age 70-80years, 30 pack-year currently smoking OR have quit w/in 15years.) does not qualify.     Additional Screening:  Hepatitis C Screening: does qualify; Completed 07/29/15  Vision Screening: Recommended annual ophthalmology exams for early detection of glaucoma and other disorders of the eye. Is the patient  up to date with their annual eye exam?  Yes  Who is the provider or what is the name of the office in which the patient attends annual eye exams? Dr LPrudencio BurlyIf pt is not established with a provider, would they like to be referred to a provider to establish care? No .   Dental Screening: Recommended annual dental exams for proper oral hygiene  Community Resource Referral / Chronic Care Management:  CRR required this visit?  No   CCM required this visit?  No      Plan:     I have personally reviewed and noted the following in the patient's chart:   Medical and social history Use of alcohol, tobacco or illicit drugs  Current medications and supplements including opioid prescriptions. Patient is currently taking opioid prescriptions. Information provided to patient regarding non-opioid alternatives. Patient advised to discuss non-opioid treatment plan with their provider. Functional ability and status Nutritional status Physical activity Advanced directives List of other physicians Hospitalizations, surgeries, and ER visits in previous 12 months Vitals Screenings to include cognitive, depression, and falls Referrals and appointments  In addition, I have reviewed and discussed with patient certain preventive protocols, quality metrics, and best practice recommendations. A written personalized care plan for preventive services as well as general preventive health recommendations were provided to patient.     BCriselda Peaches LPN   [id number]  Nurse Notes: None

## 2022-09-07 DIAGNOSIS — Z86006 Personal history of melanoma in-situ: Secondary | ICD-10-CM | POA: Diagnosis not present

## 2022-09-07 DIAGNOSIS — L578 Other skin changes due to chronic exposure to nonionizing radiation: Secondary | ICD-10-CM | POA: Diagnosis not present

## 2022-09-07 DIAGNOSIS — Z08 Encounter for follow-up examination after completed treatment for malignant neoplasm: Secondary | ICD-10-CM | POA: Diagnosis not present

## 2022-09-07 DIAGNOSIS — L821 Other seborrheic keratosis: Secondary | ICD-10-CM | POA: Diagnosis not present

## 2022-09-07 DIAGNOSIS — D225 Melanocytic nevi of trunk: Secondary | ICD-10-CM | POA: Diagnosis not present

## 2022-09-07 DIAGNOSIS — L814 Other melanin hyperpigmentation: Secondary | ICD-10-CM | POA: Diagnosis not present

## 2022-10-09 ENCOUNTER — Other Ambulatory Visit: Payer: Self-pay | Admitting: Family Medicine

## 2022-10-09 NOTE — Telephone Encounter (Addendum)
Last refill-03/07/22--120 tabs, 5 refills Last OV with Dr. Burchette-08/23/22  No future OV scheduled.

## 2022-10-11 ENCOUNTER — Other Ambulatory Visit: Payer: Self-pay | Admitting: Family Medicine

## 2022-10-12 NOTE — Telephone Encounter (Signed)
Pt Victoria Mosley was on 01/25/22 Last refill done on 01/25/22 Please advise

## 2022-10-16 ENCOUNTER — Other Ambulatory Visit: Payer: Self-pay | Admitting: Family Medicine

## 2022-10-16 ENCOUNTER — Ambulatory Visit (INDEPENDENT_AMBULATORY_CARE_PROVIDER_SITE_OTHER): Payer: PPO | Admitting: Family Medicine

## 2022-10-16 ENCOUNTER — Telehealth: Payer: Self-pay | Admitting: Family Medicine

## 2022-10-16 ENCOUNTER — Encounter: Payer: Self-pay | Admitting: Family Medicine

## 2022-10-16 VITALS — BP 126/80 | HR 71 | Temp 99.3°F | Wt 187.0 lb

## 2022-10-16 DIAGNOSIS — M5442 Lumbago with sciatica, left side: Secondary | ICD-10-CM

## 2022-10-16 MED ORDER — FIORICET 50-300-40 MG PO CAPS: 1.0000 | ORAL_CAPSULE | Freq: Two times a day (BID) | ORAL | 5 refills | Status: DC | PRN

## 2022-10-16 MED ORDER — HYDROMORPHONE HCL 8 MG PO TABS: 8.0000 mg | ORAL_TABLET | Freq: Three times a day (TID) | ORAL | 0 refills | Status: DC | PRN

## 2022-10-16 MED ORDER — PREDNISONE 20 MG PO TABS: 20.0000 mg | ORAL_TABLET | Freq: Two times a day (BID) | ORAL | 0 refills | Status: DC

## 2022-10-16 MED ORDER — DIAZEPAM 5 MG PO TABS: ORAL_TABLET | ORAL | 0 refills | Status: DC

## 2022-10-16 NOTE — Telephone Encounter (Signed)
Pt states that her pharmacy does not have Hydromorphone in stock, states that [facility] [address] [facility] mg if ok she can take 2 tablets Please advise

## 2022-10-16 NOTE — Telephone Encounter (Signed)
Requesting to change Dilaudid to '4mg'$ .   Pharmacy updated.

## 2022-10-16 NOTE — Progress Notes (Signed)
   Subjective:    Patient ID: Victoria Mosley, female    DOB: [birth year], 71 y.o.   MRN: [id number]  HPI Here for sharp "burning" pains that start in the left lower back and which radiate to the left anterior thigh and knee area. No weakness or numbness. No recent trauma, but the pain started after she [place] dock one week [place] (she had been on a cruise). Since then she has tried her usual Prednisone 5 mg BID, Aleve, and Ibuprofen with no relief. The pain keeps her up at night. Of note she had lumbar spine surgery at L5-S1 in 1993.    Review of Systems  Constitutional: Negative.   Respiratory: Negative.    Cardiovascular: Negative.   Musculoskeletal:  Positive for back pain.       Objective:   Physical Exam Constitutional:      Comments: In pain, limping   Cardiovascular:     Rate and Rhythm: Normal rate and regular rhythm.     Pulses: Normal pulses.     Heart sounds: Normal heart sounds.  Pulmonary:     Effort: Pulmonary effort is normal.     Breath sounds: Normal breath sounds.  Musculoskeletal:     Comments: She is quite tender in the lower back and over the left sciatic notch. ROM is limited by pain, but SLR are negative   Neurological:     Mental Status: She is alert.           Assessment & Plan:  Low back pain with sciatica. Increase the Prednisone to 20 mg BID. Add Dilaudid 8 mg as needed for pain. Set up an MRI of the lumbar spine.  Alysia Penna, MD

## 2022-10-16 NOTE — Telephone Encounter (Signed)
Pt called to ask if MD could please re-route the Rx for the  HYDROmorphone (DILAUDID) 8 MG tablet  [id number] [place] [place] [address]: [phone] Fax: [phone]    Because [facility] [place] not [facility] it.  LOV:  08/23/22

## 2022-10-16 NOTE — Telephone Encounter (Signed)
Pt asking HYDROmorphone (DILAUDID) 8 MG tablet be sent  [id number] [place] [place] [address]: [phone] Fax: [phone]   And asking you change it from '8mg'$  to '4mg'$  because they do not have the '8mg'$ 

## 2022-10-17 MED ORDER — HYDROMORPHONE HCL 4 MG PO TABS: 4.0000 mg | ORAL_TABLET | Freq: Four times a day (QID) | ORAL | 0 refills | Status: AC | PRN

## 2022-10-17 NOTE — Telephone Encounter (Signed)
Pt calling to check on progress of this refill.

## 2022-10-17 NOTE — Telephone Encounter (Signed)
I made the change

## 2022-10-17 NOTE — Addendum Note (Signed)
Addended by: Alysia Penna A on: 10/17/2022 01:35 PM   Modules accepted: Orders

## 2022-10-18 NOTE — Telephone Encounter (Signed)
Pt notified via My Chart

## 2022-10-31 ENCOUNTER — Telehealth: Payer: Self-pay | Admitting: Family Medicine

## 2022-10-31 NOTE — Telephone Encounter (Signed)
Patient called to get refill on predniSONE (DELTASONE) 20 MG tablet     Patient is needing enough to last until her MRI results, her MRI is scheduled for 11/10/22.      Please send to [place], [place] - [address] Phone: [phone]  Fax: [phone]         Please advise

## 2022-10-31 NOTE — Telephone Encounter (Signed)
Call in Prednisone 20 mg BID, # 60 with no rf

## 2022-11-01 ENCOUNTER — Other Ambulatory Visit: Payer: Self-pay

## 2022-11-01 DIAGNOSIS — M549 Dorsalgia, unspecified: Secondary | ICD-10-CM

## 2022-11-01 MED ORDER — PREDNISONE 20 MG PO TABS: 20.0000 mg | ORAL_TABLET | Freq: Two times a day (BID) | ORAL | 0 refills | Status: DC

## 2022-11-01 NOTE — Telephone Encounter (Signed)
Refill for Prednisone 20 mg sent to [place]

## 2022-11-10 ENCOUNTER — Ambulatory Visit
Admission: RE | Admit: 2022-11-10 | Discharge: 2022-11-10 | Disposition: A | Discharge status: Home / Self Care | Payer: PPO | Source: Ambulatory Visit | Attending: Family Medicine | Admitting: Family Medicine

## 2022-11-10 DIAGNOSIS — M4727 Other spondylosis with radiculopathy, lumbosacral region: Secondary | ICD-10-CM | POA: Diagnosis not present

## 2022-11-10 DIAGNOSIS — M4807 Spinal stenosis, lumbosacral region: Secondary | ICD-10-CM | POA: Diagnosis not present

## 2022-11-10 DIAGNOSIS — M5442 Lumbago with sciatica, left side: Secondary | ICD-10-CM

## 2022-11-10 DIAGNOSIS — M5117 Intervertebral disc disorders with radiculopathy, lumbosacral region: Secondary | ICD-10-CM | POA: Diagnosis not present

## 2022-11-10 MED ORDER — GADOPICLENOL 0.5 MMOL/ML IV SOLN
7.5000 mL | Freq: Once | INTRAVENOUS | Status: AC | PRN
  Administered 2022-11-10: 7.5 mL via INTRAVENOUS

## 2022-11-15 ENCOUNTER — Telehealth: Payer: Self-pay | Admitting: Family Medicine

## 2022-11-15 NOTE — Telephone Encounter (Signed)
Pt already has appointment scheduled for 12/27/22 with Dr Kristeen Miss Neurosurgeon. Pt wants to know what she needs to do about the Partially visualized uterine fibroid along the fundus noted on the MRI.

## 2022-11-15 NOTE — Telephone Encounter (Signed)
Pt call and stated she need her result from her mrn and want a call back because she is going out of town for  a mo.

## 2022-11-16 NOTE — Telephone Encounter (Signed)
We do not need to do anything about this. This is a benign growth on the uterus which is common. We saw it on a CT scan in 2018 as well

## 2022-11-16 NOTE — Telephone Encounter (Signed)
Spoke with pt advised of Dr Fry recommendation, verbalized understanding 

## 2022-11-21 ENCOUNTER — Other Ambulatory Visit: Payer: Self-pay | Admitting: Family Medicine

## 2022-12-27 DIAGNOSIS — M4696 Unspecified inflammatory spondylopathy, lumbar region: Secondary | ICD-10-CM | POA: Diagnosis not present

## 2022-12-27 DIAGNOSIS — Z6829 Body mass index (BMI) 29.0-29.9, adult: Secondary | ICD-10-CM | POA: Diagnosis not present

## 2023-01-04 NOTE — Telephone Encounter (Signed)
error 

## 2023-01-10 DIAGNOSIS — L239 Allergic contact dermatitis, unspecified cause: Secondary | ICD-10-CM | POA: Diagnosis not present

## 2023-01-18 DIAGNOSIS — H5203 Hypermetropia, bilateral: Secondary | ICD-10-CM | POA: Diagnosis not present

## 2023-01-18 DIAGNOSIS — H2513 Age-related nuclear cataract, bilateral: Secondary | ICD-10-CM | POA: Diagnosis not present

## 2023-01-18 DIAGNOSIS — H25011 Cortical age-related cataract, right eye: Secondary | ICD-10-CM | POA: Diagnosis not present

## 2023-01-18 DIAGNOSIS — H524 Presbyopia: Secondary | ICD-10-CM | POA: Diagnosis not present

## 2023-02-20 ENCOUNTER — Ambulatory Visit (INDEPENDENT_AMBULATORY_CARE_PROVIDER_SITE_OTHER): Payer: PPO | Admitting: Family Medicine

## 2023-02-20 ENCOUNTER — Encounter: Payer: Self-pay | Admitting: Family Medicine

## 2023-02-20 VITALS — BP 118/78 | HR 66 | Temp 98.6°F | Ht 67.0 in | Wt 189.0 lb

## 2023-02-20 DIAGNOSIS — E039 Hypothyroidism, unspecified: Secondary | ICD-10-CM | POA: Diagnosis not present

## 2023-02-20 DIAGNOSIS — Z0000 Encounter for general adult medical examination without abnormal findings: Secondary | ICD-10-CM | POA: Diagnosis not present

## 2023-02-20 LAB — LIPID PANEL
Cholesterol: 192 mg/dL (ref 0–200)
HDL: 37.8 mg/dL — ABNORMAL LOW (ref >39.00)
NonHDL: 154.65
Total CHOL/HDL Ratio: 5
Triglycerides: 328 mg/dL — ABNORMAL HIGH (ref 0.0–149.0)
VLDL: 65.6 mg/dL — ABNORMAL HIGH (ref 0.0–40.0)

## 2023-02-20 LAB — HEPATIC FUNCTION PANEL
ALT: 23 U/L (ref 0–35)
AST: 20 U/L (ref 0–37)
Albumin: 3.9 g/dL (ref 3.5–5.2)
Alkaline Phosphatase: 58 U/L (ref 39–117)
Bilirubin, Direct: 0.1 mg/dL (ref 0.0–0.3)
Total Bilirubin: 0.4 mg/dL (ref 0.2–1.2)
Total Protein: 6.5 g/dL (ref 6.0–8.3)

## 2023-02-20 LAB — CBC WITH DIFFERENTIAL/PLATELET
Basophils Absolute: 0 10*3/uL (ref 0.0–0.1)
Basophils Relative: 0.3 % (ref 0.0–3.0)
Eosinophils Absolute: 0.1 10*3/uL (ref 0.0–0.7)
Eosinophils Relative: 1.7 % (ref 0.0–5.0)
HCT: 45.1 % (ref 36.0–46.0)
Hemoglobin: 15.2 g/dL — ABNORMAL HIGH (ref 12.0–15.0)
Lymphocytes Relative: 36.6 % (ref 12.0–46.0)
Lymphs Abs: 2.7 10*3/uL (ref 0.7–4.0)
MCHC: 33.8 g/dL (ref 30.0–36.0)
MCV: 92.7 fl (ref 78.0–100.0)
Monocytes Absolute: 0.9 10*3/uL (ref 0.1–1.0)
Monocytes Relative: 11.6 % (ref 3.0–12.0)
Neutro Abs: 3.7 10*3/uL (ref 1.4–7.7)
Neutrophils Relative %: 49.8 % (ref 43.0–77.0)
Platelets: 217 10*3/uL (ref 150.0–400.0)
RBC: 4.86 Mil/uL (ref 3.87–5.11)
RDW: 13.2 % (ref 11.5–15.5)
WBC: 7.4 10*3/uL (ref 4.0–10.5)

## 2023-02-20 LAB — TSH: TSH: 1.74 u[IU]/mL (ref 0.35–5.50)

## 2023-02-20 LAB — BASIC METABOLIC PANEL
BUN: 15 mg/dL (ref 6–23)
CO2: 28 mEq/L (ref 19–32)
Calcium: 9.1 mg/dL (ref 8.4–10.5)
Chloride: 105 mEq/L (ref 96–112)
Creatinine, Ser: 0.67 mg/dL (ref 0.40–1.20)
GFR: 88.33 mL/min (ref >60.00)
Glucose, Bld: 123 mg/dL — ABNORMAL HIGH (ref 70–99)
Potassium: 4 mEq/L (ref 3.5–5.1)
Sodium: 143 mEq/L (ref 135–145)

## 2023-02-20 LAB — T3, FREE: T3, Free: 3.1 pg/mL (ref 2.3–4.2)

## 2023-02-20 LAB — LDL CHOLESTEROL, DIRECT: Direct LDL: 127 mg/dL

## 2023-02-20 LAB — HEMOGLOBIN A1C: Hgb A1c MFr Bld: 7.1 % — ABNORMAL HIGH (ref 4.6–6.5)

## 2023-02-20 LAB — T4, FREE: Free T4: 1.12 ng/dL (ref 0.60–1.60)

## 2023-02-20 MED ORDER — PROCHLORPERAZINE MALEATE 10 MG PO TABS: 10.0000 mg | ORAL_TABLET | Freq: Four times a day (QID) | ORAL | 3 refills | Status: AC | PRN

## 2023-02-20 MED ORDER — TRAMADOL HCL 50 MG PO TABS: 100.0000 mg | ORAL_TABLET | Freq: Four times a day (QID) | ORAL | 3 refills | Status: DC | PRN

## 2023-02-20 MED ORDER — FIORICET 50-300-40 MG PO CAPS: 1.0000 | ORAL_CAPSULE | Freq: Two times a day (BID) | ORAL | 5 refills | Status: DC | PRN

## 2023-02-20 MED ORDER — PREDNISONE 5 MG PO TABS: 5.0000 mg | ORAL_TABLET | Freq: Two times a day (BID) | ORAL | 3 refills | Status: DC | PRN

## 2023-02-20 MED ORDER — EZETIMIBE 10 MG PO TABS: 10.0000 mg | ORAL_TABLET | Freq: Every day | ORAL | 3 refills | Status: DC

## 2023-02-20 MED ORDER — SERTRALINE HCL 50 MG PO TABS: 50.0000 mg | ORAL_TABLET | Freq: Every day | ORAL | 3 refills | Status: DC

## 2023-02-20 MED ORDER — PROPRANOLOL HCL 40 MG PO TABS: 40.0000 mg | ORAL_TABLET | Freq: Every day | ORAL | 3 refills | Status: DC

## 2023-02-20 MED ORDER — LEVOTHYROXINE SODIUM 75 MCG PO TABS: 75.0000 ug | ORAL_TABLET | Freq: Every day | ORAL | 3 refills | Status: DC

## 2023-02-20 MED ORDER — OMEPRAZOLE 20 MG PO CPDR: 20.0000 mg | DELAYED_RELEASE_CAPSULE | Freq: Every day | ORAL | 3 refills | Status: DC

## 2023-02-20 MED ORDER — PROPRANOLOL HCL 40 MG PO TABS: 40.0000 mg | ORAL_TABLET | Freq: Every day | ORAL | 0 refills | Status: DC

## 2023-02-20 NOTE — Progress Notes (Signed)
Subjective:    Patient ID: Victoria Mosley, female    DOB: 04-06-1951, 72 y.o.   MRN: [id number]  HPI Here for a well exam. She has a few issues to discuss. First her springtime allergies have been bothering her with stuffy head, sneezing and PND. She has tried Claritin and Zyrtec with poor results. She does find that Sudafed helps with congestion. Second she has been struggling with anxiety the past 3 months and she does not know why. She worries about things and she feels tense most of the time. No depression symptoms.    Review of Systems  Constitutional: Negative.   HENT:  Positive for postnasal drip and sneezing.   Eyes: Negative.   Respiratory: Negative.    Cardiovascular: Negative.   Gastrointestinal: Negative.   Genitourinary:  Negative for decreased urine volume, difficulty urinating, dyspareunia, dysuria, enuresis, flank pain, frequency, hematuria, pelvic pain and urgency.  Musculoskeletal: Negative.   Skin: Negative.   Neurological: Negative.  Negative for headaches.  Psychiatric/Behavioral:  Negative for agitation, behavioral problems, confusion, decreased concentration, dysphoric mood, hallucinations, self-injury, sleep disturbance and suicidal ideas. The patient is nervous/anxious.        Objective:   Physical Exam Constitutional:      General: She is not in acute distress.    Appearance: Normal appearance. She is well-developed.  HENT:     Head: Normocephalic and atraumatic.     Right Ear: External ear normal.     Left Ear: External ear normal.     Nose: Nose normal.     Mouth/Throat:     Pharynx: No oropharyngeal exudate.  Eyes:     General: No scleral icterus.    Conjunctiva/sclera: Conjunctivae normal.     Pupils: Pupils are equal, round, and reactive to light.  Neck:     Thyroid: No thyromegaly.     Vascular: No JVD.  Cardiovascular:     Rate and Rhythm: Normal rate and regular rhythm.     Heart sounds: Normal heart sounds. No murmur heard.    No  friction rub. No gallop.  Pulmonary:     Effort: Pulmonary effort is normal. No respiratory distress.     Breath sounds: Normal breath sounds. No wheezing or rales.  Chest:     Chest wall: No tenderness.  Abdominal:     General: Bowel sounds are normal. There is no distension.     Palpations: Abdomen is soft. There is no mass.     Tenderness: There is no abdominal tenderness. There is no guarding or rebound.  Musculoskeletal:        General: No tenderness. Normal range of motion.     Cervical back: Normal range of motion and neck supple.  Lymphadenopathy:     Cervical: No cervical adenopathy.  Skin:    General: Skin is warm and dry.     Findings: No erythema or rash.  Neurological:     Mental Status: She is alert and oriented to person, place, and time.     Cranial Nerves: No cranial nerve deficit.     Motor: No abnormal muscle tone.     Coordination: Coordination normal.     Deep Tendon Reflexes: Reflexes are normal and symmetric. Reflexes normal.  Psychiatric:        Behavior: Behavior normal.        Thought Content: Thought content normal.        Judgment: Judgment normal.           Assessment &  Plan:  Well exam. We discussed diet and exercise. Get fasting labs. For the allergies, I suggested she try Xyzal. For the anxiety, she will start on Zoloft 50 mg daily. Report back in 3-4 weeks.  Gershon Crane, MD

## 2023-02-26 ENCOUNTER — Telehealth: Payer: Self-pay | Admitting: Family Medicine

## 2023-02-26 DIAGNOSIS — Z87442 Personal history of urinary calculi: Secondary | ICD-10-CM | POA: Diagnosis not present

## 2023-02-26 DIAGNOSIS — N3020 Other chronic cystitis without hematuria: Secondary | ICD-10-CM | POA: Diagnosis not present

## 2023-02-26 NOTE — Telephone Encounter (Signed)
Pt discontinued taking sertraline (ZOLOFT) 50 MG tablet, says she didn't like the way it made her feel. Says the issue she was taking it for has improved and she will reach out if she feels she needs something

## 2023-02-27 ENCOUNTER — Other Ambulatory Visit: Payer: Self-pay

## 2023-02-27 NOTE — Telephone Encounter (Signed)
FYI

## 2023-03-29 DIAGNOSIS — Z1231 Encounter for screening mammogram for malignant neoplasm of breast: Secondary | ICD-10-CM | POA: Diagnosis not present

## 2023-03-29 LAB — HM MAMMOGRAPHY

## 2023-03-30 ENCOUNTER — Encounter: Payer: Self-pay | Admitting: Family Medicine

## 2023-06-11 ENCOUNTER — Telehealth: Payer: Self-pay | Admitting: Family Medicine

## 2023-06-11 NOTE — Telephone Encounter (Signed)
Pt is going to dental on Wednesday and need #12 amoxicillin 500 mg  [facility] [address], [place] - [address] Phone: [phone]  Fax: [phone]

## 2023-06-12 ENCOUNTER — Other Ambulatory Visit: Payer: Self-pay | Admitting: Nurse Practitioner

## 2023-06-12 DIAGNOSIS — Z79890 Hormone replacement therapy (postmenopausal): Secondary | ICD-10-CM

## 2023-06-12 MED ORDER — AMOXICILLIN 500 MG PO CAPS: 2000.0000 mg | ORAL_CAPSULE | ORAL | 3 refills | Status: DC | PRN

## 2023-06-12 NOTE — Telephone Encounter (Signed)
Done

## 2023-06-13 NOTE — Telephone Encounter (Signed)
Med refill request: Estrace 0.5 mg tab PO daily And Prometrium 100 mg PO daily  Last AEX: 05/05/22 -TW Next AEX: 07/17/23 -TW Last MMG (if hormonal med) 04/29/23 -BiRads 1 neg  Spoke with patient. Patient states she will run out of medication prior to next visit. Requesting refill. Patient states insurance now covers 100 caps per month, asking for 100. States she travels often and this gives her "extra" if she is not back to refill. Advised I will send request to [facility]. Keep AEX as scheduled for future refills.   Refill authorized: Please Advise?

## 2023-06-18 ENCOUNTER — Ambulatory Visit: Payer: PPO | Admitting: Nurse Practitioner

## 2023-07-17 ENCOUNTER — Encounter: Payer: Self-pay | Admitting: Nurse Practitioner

## 2023-07-17 ENCOUNTER — Ambulatory Visit (INDEPENDENT_AMBULATORY_CARE_PROVIDER_SITE_OTHER): Payer: PPO | Admitting: Nurse Practitioner

## 2023-07-17 VITALS — BP 128/82 | HR 78 | Ht 67.0 in | Wt 184.0 lb

## 2023-07-17 DIAGNOSIS — R350 Frequency of micturition: Secondary | ICD-10-CM | POA: Diagnosis not present

## 2023-07-17 DIAGNOSIS — N3000 Acute cystitis without hematuria: Secondary | ICD-10-CM

## 2023-07-17 DIAGNOSIS — Z79890 Hormone replacement therapy (postmenopausal): Secondary | ICD-10-CM

## 2023-07-17 DIAGNOSIS — N390 Urinary tract infection, site not specified: Secondary | ICD-10-CM

## 2023-07-17 DIAGNOSIS — N958 Other specified menopausal and perimenopausal disorders: Secondary | ICD-10-CM

## 2023-07-17 DIAGNOSIS — Z9289 Personal history of other medical treatment: Secondary | ICD-10-CM

## 2023-07-17 DIAGNOSIS — Z9189 Other specified personal risk factors, not elsewhere classified: Secondary | ICD-10-CM

## 2023-07-17 DIAGNOSIS — Z01419 Encounter for gynecological examination (general) (routine) without abnormal findings: Secondary | ICD-10-CM

## 2023-07-17 DIAGNOSIS — M8580 Other specified disorders of bone density and structure, unspecified site: Secondary | ICD-10-CM

## 2023-07-17 MED ORDER — PROGESTERONE MICRONIZED 100 MG PO CAPS: 100.0000 mg | ORAL_CAPSULE | Freq: Every day | ORAL | 3 refills | Status: DC

## 2023-07-17 MED ORDER — ESTRADIOL 0.1 MG/GM VA CREA: 1.0000 g | TOPICAL_CREAM | VAGINAL | 1 refills | Status: DC

## 2023-07-17 MED ORDER — ESTRADIOL 0.5 MG PO TABS: 0.5000 mg | ORAL_TABLET | Freq: Every day | ORAL | 3 refills | Status: DC

## 2023-07-17 MED ORDER — NITROFURANTOIN MONOHYD MACRO 100 MG PO CAPS: 100.0000 mg | ORAL_CAPSULE | Freq: Two times a day (BID) | ORAL | 0 refills | Status: DC

## 2023-07-17 NOTE — Progress Notes (Signed)
Victoria Mosley May 14, 1951 [id number]   History:  72 y.o. G1P1001 presents for breast and pelvic exam. Menopausal since mid 30s, on HRT. Has tried to wean in the past but does not tolerate. History of persistent HPV (see below). Mild osteopenia, hypothyroidism managed by PCP. Normal mammogram history. H/O recurrent UTIs. Complains of urinary frequency and odor for a few days.   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 Saw Dr. Andrey Farmer at [facility] who does not feel hysterectomy is best option as she would still require vaginal paps due to possibility of HPV of vulva/vagina 2019/2020 normal cytology positive HPV 2021 normal cytology negative HPV 2022/2023 Normal cytology neg HPV  Gynecologic History No LMP recorded. Patient is postmenopausal.   Contraception: post menopausal status Sexually active: Yes  Health Maintenance Last Pap: 05/25/2022. Results were: Normal neg HPV, 3-year repeat Last mammogram: 03/29/2023. Results were: Normal Last colonoscopy: 05/26/2022. Results were: Tubular adenoma, 5-year recall Last Dexa: 05/30/2022. Results were: T-score -1.2  Past medical history, past surgical history, family history and social history were all reviewed and documented in the EPIC chart. Married. Retired. Travels often. Going on 6 week trip to [place] next [place]. 15 yo son, has 3 daughters ages 67, 82, and 14. Oldest with plans for vet school.   ROS:  A ROS was performed and pertinent positives and negatives are included.  Exam:  Vitals:   07/17/23 0905  BP: 128/82  Pulse: 78  SpO2: 100%  Weight: 184 lb (83.5 kg)  Height: 5\' 7"  (1.702 m)     Body mass index is 28.82 kg/m.  General appearance:  Normal Thyroid:  Symmetrical, normal in size, without palpable masses or nodularity. Respiratory  Auscultation:  Clear without wheezing or  rhonchi Cardiovascular  Auscultation:  Regular rate, without rubs, murmurs or gallops  Edema/varicosities:  Not grossly evident Abdominal  Soft,nontender, without masses, guarding or rebound.  Liver/spleen:  No organomegaly noted  Hernia:  None appreciated  Skin  Inspection:  Grossly normal Breasts: Examined lying and sitting.   Right: Without masses, retractions, nipple discharge or axillary adenopathy.   Left: Without masses, retractions, nipple discharge or axillary adenopathy. Genitourinary   Inguinal/mons:  Normal without inguinal adenopathy  External genitalia:  Normal appearing vulva with no masses, tenderness, or lesions  BUS/Urethra/Skene's glands:  Normal  Vagina:  Normal appearing with normal color and discharge, no lesions. Atrophic changes  Cervix:  Normal appearing without discharge or lesions. Stenotic  Uterus:  Difficult to palpate due to body habitus but no gross masses or tenderness  Adnexa/parametria:     Rt: Normal in size, without masses or tenderness.   Lt: Normal in size, without masses or tenderness.  Anus and perineum: Normal  Digital rectal exam: Not indicated  Patient informed chaperone available to be present for breast and pelvic exam. Patient has requested no chaperone to be present. Patient has been advised what will be completed during breast and pelvic exam.   UA 1+ leukocytes, positive nitrites, negative blood, dark yellow/cloudy. Microscopic: wbc 10-20, rbc none, many bacteria, + odor  Assessment/Plan:  72 y.o. G1P1001 for breast and pelvic exam.   Encounter for breast and pelvic examination - Education provided on SBEs, importance of preventative screenings, current guidelines, high calcium diet, regular exercise, and multivitamin daily. Labs with PCP.   Osteopenia, unspecified location - Managed by PCP. T-score -  1.2. She has had multiple orthopedic surgeries - foot, bilateral hips, right knee, laminectomy.   Postmenopausal hormone therapy  (HRT) - Plan: estradiol (ESTRACE) 0.5 MG tablet daily, Prometrium 100 mg daily. Has tried to wean in the past but did not tolerate. Discussed risks of blood clots, heart attack, stroke, and breast cancer with continued use. She would like to continue. Refill x 1 year provided.   Recurrent UTI - Plan: estradiol (ESTRACE VAGINAL) 0.1 MG/GM vaginal cream twice weekly. Apply externally. Sees urology.   Genitourinary syndrome of menopause - Plan: estradiol (ESTRACE VAGINAL) 0.1 MG/GM vaginal cream twice weekly.   Urinary frequency - Plan: Urinalysis,Complete w/RFL Culture  Acute cystitis without hematuria - Plan: nitrofurantoin, macrocrystal-monohydrate, (MACROBID) 100 MG capsule BID x 7 days. Culture pending.   Screening for cervical cancer - See HPI for pap history. H/O persistent HPV, normal 2021/2022/2023. Will repeat at 3-year interval per guidelines.  Screening for breast cancer - Normal mammogram history.  Continue annual screenings.  Normal breast exam today.  Screening for colon cancer - 04/2022 colonoscopy. Will repeat at 5-year interval per GI recommendation.   Return in 1 year for breast and pelvic exam.     Olivia Mackie DNP, 9:28 AM 07/17/2023

## 2023-07-20 LAB — URINE CULTURE
MICRO NUMBER:: 15477191
SPECIMEN QUALITY:: ADEQUATE

## 2023-07-20 LAB — URINALYSIS, COMPLETE W/RFL CULTURE
Bilirubin Urine: NEGATIVE
Glucose, UA: NEGATIVE
Hgb urine dipstick: NEGATIVE
Hyaline Cast: NONE SEEN /LPF
Ketones, ur: NEGATIVE
Nitrites, Initial: POSITIVE — AB
Protein, ur: NEGATIVE
RBC / HPF: NONE SEEN /HPF (ref 0–2)
Specific Gravity, Urine: 1.025 (ref 1.001–1.035)
pH: 6 (ref 5.0–8.0)

## 2023-07-20 LAB — CULTURE INDICATED

## 2023-07-24 ENCOUNTER — Other Ambulatory Visit: Payer: Self-pay

## 2023-09-01 ENCOUNTER — Other Ambulatory Visit: Payer: Self-pay | Admitting: Family Medicine

## 2023-09-04 ENCOUNTER — Ambulatory Visit (INDEPENDENT_AMBULATORY_CARE_PROVIDER_SITE_OTHER): Payer: PPO | Admitting: Family Medicine

## 2023-09-04 ENCOUNTER — Encounter: Payer: Self-pay | Admitting: Family Medicine

## 2023-09-04 DIAGNOSIS — Z0000 Encounter for general adult medical examination without abnormal findings: Secondary | ICD-10-CM | POA: Diagnosis not present

## 2023-09-04 NOTE — Progress Notes (Signed)
PATIENT CHECK-IN and HEALTH RISK ASSESSMENT QUESTIONNAIRE:  -completed by phone/video for upcoming Medicare Preventive Visit  Pre-Visit Check-in: 1)Vitals (height, wt, BP, etc) - record in vitals section for visit on day of visit Request home vitals (wt, BP, etc.) and enter into vitals, THEN update Vital Signs SmartPhrase below at the top of the HPI. See below.  2)Review and Update Medications, Allergies PMH, Surgeries, Social history in Epic 3)Hospitalizations in the last year with date/reason? No   4)Review and Update Care Team (patient's specialists) in Epic 5) Complete PHQ9 in Epic  6) Complete Fall Screening in Epic 7)Review all Health Maintenance Due and order under PCP if not done.  8)Medicare Wellness Questionnaire: Answer theses question about your habits: Do you drink alcohol? No  If yes, how many drinks do you have a day?NO  Have you ever smoked?Yes  Quit date if applicable? Stopped 2000  How many packs a day do/did you smoke? 5 cig a day  Do you use smokeless tobacco?NO  Do you use an illicit drugs?No  Do you exercises? NO - has bad ankle and needs ankle replacement and has not done that - wears clip on brace. Has a list of exercises for her ankle and sometimes does that.  Are you sexually active? Yes Number of partners?One  Typical breakfast: eggs, bacon toast  Typical lunch: something light: sandwich Typical dinner: Meat, Veg,  Typical snacks:trail Mix  Beverages: Lemonade, water, soda  Answer theses question about you: Can you perform most household chores?NO, ankle and back pain  Do you find it hard to follow a conversation in a noisy room? Yes, ADHD - reports did have audiology eval in the past. Feels more is add.  Do you often ask people to speak up or repeat themselves?Yes  Do you feel that you have a problem with memory?Yes  Do you balance your checkbook and or bank acounts?yes with Husband Do you feel safe at home?Yes  Last dentist visit? 6 weeks ago  Do you  need assistance with any of the following: Please note if so   Driving? Yes   Feeding yourself? NO   Getting from bed to chair? No   Getting to the toilet? NO   Bathing or showering? NO   Dressing yourself? NO   Managing money? NO   Climbing a flight of stairs? Yes   Preparing meals? Yes   Do you have Advanced Directives in place (Living Will, Healthcare Power or Attorney)? Yes    Last eye Exam and location?march 21,2024, Dr.LYLES    Do you currently use prescribed or non-prescribed narcotic or opioid pain medications? Yes   Do you have a history or close family history of breast, ovarian, tubal or peritoneal cancer or a family member with BRCA (breast cancer susceptibility 1 and 2) gene mutations? NO   Request home vitals (wt, BP, etc.) and enter into vitals, THEN update Vital Signs SmartPhrase below at the top of the HPI. See below.   Nurse/Assistant Credentials/time stamp: Stann Ore CMA 11:48am    ----------------------------------------------------------------------------------------------------------------------------------------------------------------------------------------------------------------------  Because this visit was a virtual/telehealth visit, some criteria may be missing or patient reported. Any vitals not documented were not able to be obtained and vitals that have been documented are patient reported.    MEDICARE ANNUAL PREVENTIVE VISIT WITH PROVIDER: (Welcome to Medicare, initial annual wellness or annual wellness exam)  Virtual Visit via Phone Note  I connected with Victoria Mosley on 09/04/23 by phone  and verified that I am  speaking with the correct person using two identifiers.  Location patient: home Location provider:work or home office Persons participating in the virtual visit: patient, provider  Concerns and/or follow up today: stable   See HM section in Epic for other details of completed HM.    ROS: negative for report of fevers,  unintentional weight loss, vision changes, vision loss, hearing loss or change, chest pain, sob, hemoptysis, melena, hematochezia, hematuria, falls, bleeding or bruising, thoughts of suicide or self harm, memory loss  Patient-completed extensive health risk assessment - reviewed and discussed with the patient: See Health Risk Assessment completed with patient prior to the visit either above or in recent phone note. This was reviewed in detailed with the patient today and appropriate recommendations, orders and referrals were placed as needed per Summary below and patient instructions.   Review of Medical History: -PMH, PSH, Family History and current specialty and care providers reviewed and updated and listed below   Patient Care Team: Nelwyn Salisbury, MD as PCP - General Crist Fat, MD as Attending Physician (Urology) Fontaine, Nadyne Coombes, MD (Inactive) as Consulting Physician (Gynecology) Hilarie Fredrickson, MD as Consulting Physician (Gastroenterology) Dominica Severin, MD as Consulting Physician (Orthopedic Surgery) Ollen Gross, MD as Consulting Physician (Orthopedic Surgery) Donetta Potts (Dermatology) Verner Chol, [facility] (Inactive) as Pharmacist (Pharmacist)   Past Medical History:  Diagnosis Date   Acne    and granuloma annulare, 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 Andrey Farmer 04/30/2017, 2019 negative subtypes 16, 18/45   Hyperlipidemia    IBS (irritable bowel syndrome)    Kidney stones    bilateral   melanoma dx'd 2011   rt arm--surg only   Menopause    sees Dr. Audie Box   Migraines    Nephrolithiasis    left   Neurogenic bladder    Normal cardiac stress test 2012   PONV (postoperative nausea and vomiting)    Sepsis (HCC) 01/2016   related to infection caused by kidney stone blockage    Tendonitis    right ankle   Thyroid disease    Urinary urgency    UTI (urinary tract infection)    started atb on 03-24-12    Past Surgical History:  Procedure Laterality Date   BACK SURGERY  10/1991   L5 ruptured disc- Dr.Robinson   CARPAL TUNNEL RELEASE Right 06/2018   per Dr. Lytle Michaels TUNNEL RELEASE Left 08/2018   per Dr. Amanda Pea   CESAREAN SECTION  12/1980   COLONOSCOPY  05/26/2022   per Dr. Marina Goodell, adenomatous polyps,  repeat in 5 yrs   CYST REMOVAL HAND Right 09/2021  middle finger per Dr. Amanda Pea   CYST REMOVAL HAND Left 12/2021   ring finger per Dr. Amanda Pea   CYSTOSCOPY W/ URETERAL STENT PLACEMENT Right 02/02/2017   Procedure: CYSTOSCOPY WITH RETROGRADE PYELOGRAM/URETERAL STENT PLACEMENT;  Surgeon: Bjorn Pippin, MD;  Location: [facility];  Service: Urology;  Laterality: Right;   CYSTOSCOPY WITH RETROGRADE PYELOGRAM, URETEROSCOPY AND STENT PLACEMENT Left 12/25/2012   Procedure: CYSTOSCOPY WITH RETROGRADE PYELOGRAM, URETEROSCOPY, EXTRACTION OF  LEFT STONE WITH BASKETAND STENT PLACEMENT;  Surgeon: Milford Cage, MD;  Location[facility];  Service: Urology;  Laterality: Left;  2.5 HRS    CYSTOSCOPY/URETEROSCOPY/HOLMIUM LASER/STENT PLACEMENT Right 02/15/2017   Procedure: CYSTOSCOPY/URETEROSCOPY/HOLMIUM LASER/STENT EXCHANGE;  Surgeon: Crist Fat, MD;  Location: [facility];  Service: Urology;  Laterality: Right;   FOOT SURGERY Bilateral 1987   per Dr. Chaney Malling   HEMATOMA  EVACUATION  07/08/2012   Procedure: EVACUATION HEMATOMA;  Surgeon: Barnett Abu, MD;  Location: [facility];  Service: Neurosurgery;  Laterality: N/A;  Cervical Seven-thoracic One Laminectomy and Decompression of Epidural Hematoma   HEMORRHOID BANDING     June and August 2016, Dr Derrell Lolling   KIDNEY STONE SURGERY  12/2003   per Dr. Wanda Plump   LEEP  07/2015   final pathology LGSIL with clear margins negative ECC   LITHOTRIPSY  2007   x 3/Dr. Wanda Plump   LUMBAR LAMINECTOMY  1993   1993, at L5 per Dr. Roxan Hockey   MELANOMA EXCISION  2007   left upper arm per dr. Lonni Fix   NASAL SEPTUM SURGERY  1992   per Dr. Berna Bue   NASAL/SINUS ENDOSCOPY  03/2016   per Dr. Suzanna Obey    POSTERIOR CERVICAL LAMINECTOMY  07/08/2012   Procedure: POSTERIOR CERVICAL LAMINECTOMY;  Surgeon: Barnett Abu, MD;  Location: [facility];  Service: Neurosurgery;  Laterality: N/A;  Cervical Seven- thoracic One Laminectomy and Decompression of Epidural Hematoma   TOTAL HIP ARTHROPLASTY Right 11/2008   Aluscio   TOTAL HIP ARTHROPLASTY Left 09/27/2016   Procedure: LEFT TOTAL HIP ARTHROPLASTY ANTERIOR APPROACH;  Surgeon: Ollen Gross, MD;  Location: [facility];  Service: Orthopedics;  Laterality: Left;   TOTAL KNEE ARTHROPLASTY Right 12/07/2014   Procedure: RIGHT TOTAL KNEE ARTHROPLASTY;  Surgeon: Loanne Drilling, MD;  Location: [facility];  Service: Orthopedics;  Laterality: Right;   TRIGGER FINGER RELEASE Left 03/2019   Thumb and middle finger per Dr. Amanda Pea   TUBAL LIGATION  01/1988    Social History   Socioeconomic History   Marital status: Married    Spouse name: Viviann Spare   Number of children: 1   Years of education: Not on file   Highest education level: 12th grade  Occupational History   Occupation: Film/video editor  Tobacco Use   Smoking status: Former    Current packs/day: 0.00    Types: Cigarettes    Quit date: 12/20/1998    Years since quitting: 24.7   Smokeless tobacco: Never  Vaping Use   Vaping status:  Never Used  Substance and Sexual Activity   Alcohol use: Yes    Comment: Very rare   Drug use: No   Sexual activity: Yes    Partners: Male    Birth control/protection: Post-menopausal    Comment: 1st intercourse 85 yo-2 partners  Other Topics Concern   Not on file  Social History Narrative   Not on file   Social Determinants of Health   Financial Resource Strain: Low Risk  (10/13/2022)   Overall Financial Resource Strain (CARDIA)  Difficulty of Paying Living Expenses: Not hard at all  Food Insecurity: No Food Insecurity (10/13/2022)   Hunger Vital Sign    Worried About Running Out of Food in the Last Year: Never true    Ran Out of Food in the Last Year: Never true  Transportation Needs: No Transportation Needs (10/13/2022)   PRAPARE - Administrator, Civil Service (Medical): No    Lack of Transportation (Non-Medical): No  Physical Activity: Unknown (10/13/2022)   Exercise Vital Sign    Days of Exercise per Week: 0 days    Minutes of Exercise per Session: Not on file  Recent Concern: Physical Activity - Inactive (10/13/2022)   Exercise Vital Sign    Days of Exercise per Week: 0 days    Minutes of Exercise per Session: 0 min  Stress: No Stress Concern Present (10/13/2022)   [employer] of Occupational Health - Occupational Stress Questionnaire    Feeling of Stress : Only a little  Social Connections: Socially Integrated (10/13/2022)   Social Connection and Isolation Panel [NHANES]    Frequency of Communication with Friends and Family: More than three times a week    Frequency of Social Gatherings with Friends and Family: Once a week    Attends Religious Services: 1 to 4 times per year    Active Member of [employer] or Organizations: Yes    Attends Engineer, structural: More than 4 times per year    Marital Status: Married  Catering manager Violence: Not At Risk (08/31/2022)   Humiliation, Afraid, Rape, and Kick questionnaire    Fear of Current or  Ex-Partner: No    Emotionally Abused: No    Physically Abused: No    Sexually Abused: No    Family History  Problem Relation Age of Onset   Heart disease Mother    Hypertension Mother    Diabetes Mother    Colon polyps Father    Hypertension Father    Leukemia Father    Hypertension Sister    Heart disease Sister    Bladder Cancer Sister    Alzheimer's disease Sister    Stomach cancer Paternal Uncle    Stomach cancer Paternal Grandmother    Colon cancer Neg Hx    Esophageal cancer Neg Hx    Rectal cancer Neg Hx     Current Outpatient Medications on File Prior to Visit  Medication Sig Dispense Refill   Acetaminophen (TYLENOL PO) Take by mouth daily as needed.     amoxicillin (AMOXIL) 500 MG capsule Take 4 capsules (2,000 mg total) by mouth as needed (dental procedures). 12 capsule 3   Ascorbic Acid (VITAMIN C) 1000 MG tablet Take 1,000 mg by mouth daily.     B Complex Vitamins (VITAMIN B COMPLEX PO) Take by mouth.     betamethasone valerate lotion (VALISONE) 0.1 % Apply topically as needed.     CALCIUM CITRATE PO Take 1,000 mg by mouth at bedtime.     Cranberry 250 MG CAPS Take 500 mg by mouth 2 (two) times daily.     Cyanocobalamin (VITAMIN B 12 PO) Take 500 mcg by mouth daily.     diazepam (VALIUM) 5 MG tablet Take 1 or 2 pills 30 minutes before an MRI scan 10 tablet 0   DOCUSATE SODIUM PO Take 100 mg by mouth as needed. Takes Monday, Wednesday, Friday, Sunday     estradiol (ESTRACE VAGINAL) 0.1 MG/GM vaginal cream Place 1 g vaginally 2 (two) times a week.  42.5 g 1   estradiol (ESTRACE) 0.5 MG tablet Take 1 tablet (0.5 mg total) by mouth daily. 100 tablet 3   ezetimibe (ZETIA) 10 MG tablet Take 1 tablet (10 mg total) by mouth daily. 100 tablet 3   FIORICET 50-300-40 MG CAPS Take 1 capsule by mouth 2 (two) times daily as needed (headaches). 60 capsule 5   LACTOBACILLUS PO Take 5 mg by mouth daily.     levocetirizine (XYZAL) 5 MG tablet Take 5 mg by mouth every evening.      levothyroxine (SYNTHROID) 75 MCG tablet Take 1 tablet (75 mcg total) by mouth daily. 100 tablet 3   Multiple Vitamins-Minerals (WOMENS 50+ MULTI VITAMIN/MIN PO) Take 1 tablet by mouth daily.     naproxen sodium (ANAPROX) 220 MG tablet Take 220 mg by mouth daily.      nitrofurantoin, macrocrystal-monohydrate, (MACROBID) 100 MG capsule Take 1 capsule (100 mg total) by mouth 2 (two) times daily. 14 capsule 0   omeprazole (PRILOSEC) 20 MG capsule Take 1 capsule (20 mg total) by mouth daily. 90 capsule 3   POTASSIUM PO Take 595 mg by mouth at bedtime.     predniSONE (DELTASONE) 5 MG tablet Take 1 tablet (5 mg total) by mouth 2 (two) times daily as needed (joint pain). 100 tablet 3   prochlorperazine (COMPAZINE) 10 MG tablet Take 1 tablet (10 mg total) by mouth every 6 (six) hours as needed. 100 tablet 3   progesterone (PROMETRIUM) 100 MG capsule Take 1 capsule (100 mg total) by mouth daily. 100 capsule 3   propranolol (INDERAL) 40 MG tablet Take 1 tablet (40 mg total) by mouth daily. 100 tablet 3   Pyridoxine HCl (VITAMIN B-6 PO) Take 100 mg by mouth at bedtime.     TOBRAMYCIN OP 80 mg. EMPTY 1 CAPSULE IN SINUS RINSE BOTTLE AND USE TWICE DAILY     traMADol (ULTRAM) 50 MG tablet Take 2 tablets (100 mg total) by mouth every 6 (six) hours as needed for moderate pain. 100 tablet 3   UNABLE TO FIND as needed. Med Name: Hydrocortisone Lotion USP 2.5%     No current facility-administered medications on file prior to visit.    Allergies  Allergen Reactions   Levaquin [Levofloxacin In D5w] Anaphylaxis, Rash and Other (See Comments)    Hot, dizziness, diarrhea. Tolerated Ciprofloxacin 12/2017   Pravastatin Sodium [Pravachol]     Joint pain(severe)   Septra [Sulfamethoxazole-Trimethoprim] Anaphylaxis    diarrhea   Statins     Joint Pain   Biaxin [Clarithromycin] Nausea And Vomiting   Celebrex [Celecoxib]     Severe GI upset   Ciprofloxacin Nausea Only    Severe GI upset   Codeine Nausea And Vomiting    Doxycycline     Stomach Upset    Dust Mite Extract Other (See Comments)      Grass, trees also- cause Runny nose, sneezing (allergy symptoms)   Keflex [Cephalexin] Itching    Stomach upset       Physical Exam Vitals requested from patient and listed below if patient had equipment and was able to obtain at home for this virtual visit: There were no vitals filed for this visit. Estimated body mass index is 28.82 kg/m as calculated from the following:   Height as of 07/17/23: 5\' 7"  (1.702 m).   Weight as of 07/17/23: 184 lb (83.5 kg).  EKG (optional): deferred due to virtual visit  GENERAL: alert, oriented, no acute distress detected, full vision exam deferred  due to pandemic and/or virtual encounter  PSYCH/NEURO: pleasant and cooperative, no obvious depression or anxiety, speech and thought processing grossly intact, Cognitive function grossly intact  Flowsheet Row Office Visit from 09/04/2023 in [facility] [place]  PHQ-9 Total Score 8           09/04/2023   11:28 AM 02/20/2023    9:29 AM 10/16/2022    1:51 PM 08/31/2022    9:27 AM 08/29/2021    8:13 AM  Depression screen PHQ 2/9  Decreased Interest 0 0 1 0 0  Down, Depressed, Hopeless 0 0 1 0 0  PHQ - 2 Score 0 0 2 0 0  Altered sleeping 3 0 2    Tired, decreased energy 1 1 1     Change in appetite 1 1 1     Feeling bad or failure about yourself  0 0 0    Trouble concentrating 3 0 0    Moving slowly or fidgety/restless 0 0 1    Suicidal thoughts 0 0 0    PHQ-9 Score 8 2 7     Difficult doing work/chores  Somewhat difficult Somewhat difficult         08/31/2022    9:28 AM 10/13/2022   12:47 PM 10/16/2022    1:51 PM 07/17/2023    8:57 AM 09/04/2023   11:27 AM  Fall Risk  Falls in the past year? 0 0 0 0 0  Was there an injury with Fall? 0  0 0 0  Fall Risk Category Calculator 0  0 0 0  Fall Risk Category (Retired) Low  Low    (RETIRED) Patient Fall Risk Level Low fall risk  Low fall risk     Patient at Risk for Falls Due to No Fall Risks  No Fall Risks  No Fall Risks  Fall risk Follow up Falls prevention discussed  Falls evaluation completed       SUMMARY AND PLAN:  Encounter for Medicare annual wellness exam   Discussed applicable health maintenance/preventive health measures and advised and referred or ordered per patient preferences: -she is completely up to date on everything! Health Maintenance  Topic Date Due   COVID-19 Vaccine (9 - 2023-24 season) 09/15/2023   MAMMOGRAM  03/28/2024   Medicare Annual Wellness (AWV)  09/03/2024   Colonoscopy  05/27/2027   DTaP/Tdap/Td (4 - Td or Tdap) 01/26/2032   Pneumonia Vaccine 83+ Years old  Completed   INFLUENZA VACCINE  Completed   DEXA SCAN  Completed   Hepatitis C Screening  Completed   Zoster Vaccines- Shingrix  Completed   HPV VACCINES  Aged Out     Education and counseling on the following was provided based on the above review of health and a plan/checklist for the patient, along with additional information discussed, was provided for the patient in the patient instructions :    -Advised and counseled on a healthy lifestyle - including the importance of a healthy diet, regular physical activity, social connections and stress management. -Reviewed patient's current diet. Advised and counseled on a whole foods based healthy diet. A summary of a healthy diet was provided in the Patient Instructions.  -reviewed patient's current physical activity level and discussed exercise guidelines for adults. Discussed community resources and ideas for safe exercise at home to assist in meeting exercise guideline recommendations in a safe and healthy way.  -Advise yearly dental visits at minimum and regular eye exams    Follow up: see patient instructions  Patient Instructions  I really enjoyed getting to talk with you today! I am available on Tuesdays and Thursdays for virtual visits if you have any questions or  concerns, or if I can be of any further assistance.   CHECKLIST FROM ANNUAL WELLNESS VISIT:  -Follow up (please call to schedule if not scheduled after visit):   -yearly for annual wellness visit with primary care office  Here is a list of your preventive care/health maintenance measures and the plan for each if any are due:  PLAN For any measures below that may be due:   Health Maintenance  Topic Date Due   COVID-19 Vaccine (9 - 2023-24 season) 09/15/2023   MAMMOGRAM  03/28/2024   Medicare Annual Wellness (AWV)  09/03/2024   Colonoscopy  05/27/2027   DTaP/Tdap/Td (4 - Td or Tdap) 01/26/2032   Pneumonia Vaccine 21+ Years old  Completed   INFLUENZA VACCINE  Completed   DEXA SCAN  Completed   Hepatitis C Screening  Completed   Zoster Vaccines- Shingrix  Completed   HPV VACCINES  Aged Out    -See a dentist at least yearly  -Get your eyes checked and then per your eye specialist's recommendations  -Other issues addressed today:   -I have included below further information regarding a healthy whole foods based diet, physical activity guidelines for adults, stress management and opportunities for social connections. I hope you find this information useful.   -----------------------------------------------------------------------------------------------------------------------------------------------------------------------------------------------------------------------------------------------------------  NUTRITION: -eat real food: lots of colorful vegetables (half the plate) and fruits -5-7 servings of vegetables and fruits per day (fresh or steamed is best), exp. 2 servings of vegetables with lunch and dinner and 2 servings of fruit per day. Berries and greens such as kale and collards are great choices.  -consume on a regular basis: whole grains (make sure first ingredient on label contains the word "whole"), fresh fruits, fish, nuts, seeds, healthy oils (such as olive oil,  avocado oil, grape seed oil) -may eat small amounts of dairy and lean meat on occasion, but avoid processed meats such as ham, bacon, lunch meat, etc. -drink water -try to avoid fast food and pre-packaged foods, processed meat -most experts advise limiting sodium to < 2300mg  per day, should limit further is any chronic conditions such as high blood pressure, heart disease, diabetes, etc. The American Heart Association advised that < 1500mg  is is ideal -try to avoid foods that contain any ingredients with names you do not recognize  -try to avoid sugar/sweets (except for the natural sugar that occurs in fresh fruit) -try to avoid sweet drinks -try to avoid white rice, white bread, pasta (unless whole grain), white or yellow potatoes  EXERCISE GUIDELINES FOR ADULTS: -if you wish to increase your physical activity, do so gradually and with the approval of your doctor -STOP and seek medical care immediately if you have any chest pain, chest discomfort or trouble breathing when starting or increasing exercise  -move and stretch your body, legs, feet and arms when sitting for long periods -Physical activity guidelines for optimal health in adults: -least 150 minutes per week of aerobic exercise (can talk, but not sing) once approved by your doctor, 20-30 minutes of sustained activity or two 10 minute episodes of sustained activity every day.  -resistance training at least 2 days per week if approved by your doctor -balance exercises 3+ days per week:   Stand somewhere where you have something sturdy to hold onto if you lose balance.    1) lift up  on toes, start with 5x per day and work up to 20x   2) stand and lift on leg straight out to the side so that foot is a few inches of the floor, start with 5x each side and work up to 20x each side   3) stand on one foot, start with 5 seconds each side and work up to 20 seconds on each side  If you need ideas or help with getting more active:  -Silver  sneakers https://tools.silversneakers.com  -Walk with a Doc: http://www.duncan-williams.com/  -try to include resistance (weight lifting/strength building) and balance exercises twice per week: or the following link for ideas: http://castillo-powell.com/  BuyDucts.dk  STRESS MANAGEMENT: -can try meditating, or just sitting quietly with deep breathing while intentionally relaxing all parts of your body for 5 minutes daily -if you need further help with stress, anxiety or depression please follow up with your primary doctor or contact the wonderful folks at [facility]: [phone]  SOCIAL CONNECTIONS: -options in [place] if you wish to engage in more social and exercise related activities:  -Silver sneakers https://tools.silversneakers.com  -Walk with a Doc: http://www.duncan-williams.com/  -Check out the [facility] Active Adults 50+ section on the [place] of [employer] (hiking clubs, book clubs, cards and games, chess, exercise classes, aquatic classes and much more) - see the website for details: https://www.[place].gov/departments/parks-recreation/active-adults50  -YouTube has lots of exercise videos for different ages and abilities as well  -[facility] (a variety of indoor and outdoor inperson activities for adults). [phone]. [address]  -Virtual Online Classes (a variety of topics): see seniorplanet.org or call ([phone]) [phone]  -consider volunteering at a school, hospice center, church, senior center or elsewhere           Terressa Koyanagi, DO

## 2023-09-04 NOTE — Telephone Encounter (Signed)
Pt LOV was on 02/20/23 Last refill was done on 02/20/23 Please advise

## 2023-09-04 NOTE — Patient Instructions (Signed)
I really enjoyed getting to talk with you today! I am available on Tuesdays and Thursdays for virtual visits if you have any questions or concerns, or if I can be of any further assistance.   CHECKLIST FROM ANNUAL WELLNESS VISIT:  -Follow up (please call to schedule if not scheduled after visit):   -yearly for annual wellness visit with primary care office  Here is a list of your preventive care/health maintenance measures and the plan for each if any are due:  PLAN For any measures below that may be due:   Health Maintenance  Topic Date Due   COVID-19 Vaccine (9 - 2023-24 season) 09/15/2023   MAMMOGRAM  03/28/2024   Medicare Annual Wellness (AWV)  09/03/2024   Colonoscopy  05/27/2027   DTaP/Tdap/Td (4 - Td or Tdap) 01/26/2032   Pneumonia Vaccine 16+ Years old  Completed   INFLUENZA VACCINE  Completed   DEXA SCAN  Completed   Hepatitis C Screening  Completed   Zoster Vaccines- Shingrix  Completed   HPV VACCINES  Aged Out    -See a dentist at least yearly  -Get your eyes checked and then per your eye specialist's recommendations  -Other issues addressed today:   -I have included below further information regarding a healthy whole foods based diet, physical activity guidelines for adults, stress management and opportunities for social connections. I hope you find this information useful.   -----------------------------------------------------------------------------------------------------------------------------------------------------------------------------------------------------------------------------------------------------------  NUTRITION: -eat real food: lots of colorful vegetables (half the plate) and fruits -5-7 servings of vegetables and fruits per day (fresh or steamed is best), exp. 2 servings of vegetables with lunch and dinner and 2 servings of fruit per day. Berries and greens such as kale and collards are great choices.  -consume on a regular basis: whole  grains (make sure first ingredient on label contains the word "whole"), fresh fruits, fish, nuts, seeds, healthy oils (such as olive oil, avocado oil, grape seed oil) -may eat small amounts of dairy and lean meat on occasion, but avoid processed meats such as ham, bacon, lunch meat, etc. -drink water -try to avoid fast food and pre-packaged foods, processed meat -most experts advise limiting sodium to < 2300mg  per day, should limit further is any chronic conditions such as high blood pressure, heart disease, diabetes, etc. The American Heart Association advised that < 1500mg  is is ideal -try to avoid foods that contain any ingredients with names you do not recognize  -try to avoid sugar/sweets (except for the natural sugar that occurs in fresh fruit) -try to avoid sweet drinks -try to avoid white rice, white bread, pasta (unless whole grain), white or yellow potatoes  EXERCISE GUIDELINES FOR ADULTS: -if you wish to increase your physical activity, do so gradually and with the approval of your doctor -STOP and seek medical care immediately if you have any chest pain, chest discomfort or trouble breathing when starting or increasing exercise  -move and stretch your body, legs, feet and arms when sitting for long periods -Physical activity guidelines for optimal health in adults: -least 150 minutes per week of aerobic exercise (can talk, but not sing) once approved by your doctor, 20-30 minutes of sustained activity or two 10 minute episodes of sustained activity every day.  -resistance training at least 2 days per week if approved by your doctor -balance exercises 3+ days per week:   Stand somewhere where you have something sturdy to hold onto if you lose balance.    1) lift up on toes, start with  5x per day and work up to 20x   2) stand and lift on leg straight out to the side so that foot is a few inches of the floor, start with 5x each side and work up to 20x each side   3) stand on one foot,  start with 5 seconds each side and work up to 20 seconds on each side  If you need ideas or help with getting more active:  -Silver sneakers https://tools.silversneakers.com  -Walk with a Doc: http://www.duncan-williams.com/  -try to include resistance (weight lifting/strength building) and balance exercises twice per week: or the following link for ideas: http://castillo-powell.com/  BuyDucts.dk  STRESS MANAGEMENT: -can try meditating, or just sitting quietly with deep breathing while intentionally relaxing all parts of your body for 5 minutes daily -if you need further help with stress, anxiety or depression please follow up with your primary doctor or contact the wonderful folks at [facility]: [phone]  SOCIAL CONNECTIONS: -options in [place] if you wish to engage in more social and exercise related activities:  -Silver sneakers https://tools.silversneakers.com  -Walk with a Doc: http://www.duncan-williams.com/  -Check out the [facility] Active Adults 50+ section on the [place] [employer] (hiking clubs, book clubs, cards and games, chess, exercise classes, aquatic classes and much more) - see the website for details: https://www.[place].gov/departments/parks-recreation/active-adults50  -YouTube has lots of exercise videos for different ages and abilities as well  -[facility] (a variety of indoor and outdoor inperson activities for adults). [phone]. [address].  -Virtual Online Classes (a variety of topics): see seniorplanet.org or call [phone]  -consider volunteering at a school, hospice center, church, senior center or elsewhere

## 2023-09-04 NOTE — Progress Notes (Signed)
"  Patient was unable to self-report due to a lack of equipment at home via telehealth"

## 2023-09-12 DIAGNOSIS — D225 Melanocytic nevi of trunk: Secondary | ICD-10-CM | POA: Diagnosis not present

## 2023-09-12 DIAGNOSIS — L218 Other seborrheic dermatitis: Secondary | ICD-10-CM | POA: Diagnosis not present

## 2023-09-12 DIAGNOSIS — L570 Actinic keratosis: Secondary | ICD-10-CM | POA: Diagnosis not present

## 2023-09-12 DIAGNOSIS — L814 Other melanin hyperpigmentation: Secondary | ICD-10-CM | POA: Diagnosis not present

## 2023-09-12 DIAGNOSIS — L821 Other seborrheic keratosis: Secondary | ICD-10-CM | POA: Diagnosis not present

## 2023-12-02 ENCOUNTER — Ambulatory Visit (HOSPITAL_COMMUNITY)
Admission: EM | Admit: 2023-12-02 | Discharge: 2023-12-02 | Disposition: A | Discharge status: Home / Self Care | Payer: PPO

## 2023-12-02 ENCOUNTER — Encounter (HOSPITAL_COMMUNITY): Payer: Self-pay | Admitting: Emergency Medicine

## 2023-12-02 DIAGNOSIS — J069 Acute upper respiratory infection, unspecified: Secondary | ICD-10-CM | POA: Diagnosis not present

## 2023-12-02 MED ORDER — AZITHROMYCIN 250 MG PO TABS: ORAL_TABLET | ORAL | 0 refills | Status: DC

## 2023-12-02 MED ORDER — PROMETHAZINE-DM 6.25-15 MG/5ML PO SYRP: 5.0000 mL | ORAL_SOLUTION | Freq: Three times a day (TID) | ORAL | 0 refills | Status: DC | PRN

## 2023-12-02 NOTE — ED Provider Notes (Signed)
[facility]    CSN: [id number] Arrival date & time: 12/02/23  1655      History   Chief Complaint Chief Complaint  Patient presents with   Cough   Fever    HPI Victoria Mosley is a 73 y.o. female.   73 year old female who presents to urgent care with complaints of cough, fevers, congestion and bodyaches.  This started about 3 to 4 days ago.  Her and her husband just got off a cruise.  She has been using Sudafed, Mucinex and Alka-Seltzer but her symptoms persist.  She has some chronic sinus issues that she relates makes her symptoms more severe.  She has been on steroids for arthritic symptoms while having to walk longer distances on the cruise ship.  She is tapering off of these now.  She denies any nausea, vomiting, shortness of breath.  She did a COVID test yesterday that was negative.   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 Andrey Farmer  04/30/2017, 2019 negative subtypes 16, 18/45   Hyperlipidemia    IBS (irritable bowel syndrome)    Kidney stones    bilateral   melanoma dx'd 2011   rt arm--surg only   Menopause    sees Dr. Audie Box   Migraines    Nephrolithiasis    left   Neurogenic bladder    Normal cardiac stress test 2012   PONV (postoperative nausea and vomiting)    Sepsis (HCC) 01/2016   related to infection caused by kidney stone blockage    Tendonitis    right ankle   Thyroid disease    Urinary urgency    UTI (urinary tract infection)    started atb on 03-24-12    Patient Active Problem List   Diagnosis Date Noted   Pain in left foot 09/06/2020   Menopause    Chronic UTI (urinary tract infection)    Impingement syndrome of left shoulder region 01/06/2020   Hypothyroidism 11/24/2019   Acquired trigger finger of left middle finger 05/01/2019   Trigger thumb of left hand 05/01/2019   Trigger finger 11/25/2018   Tibialis posterior tendinitis 10/10/2018   Arthritis of hand 07/19/2018   Carpal tunnel syndrome on both sides 06/30/2018  Kidney stone 03/11/2018   Migraine 03/11/2018   Left ankle pain 01/02/2018   Chronic maxillary sinusitis 09/28/2017   OA (osteoarthritis) of hip 09/27/2016   Dizziness 04/17/2016   Intractable back pain 12/19/2014   Sciatica of left side 12/19/2014   Migraines 12/19/2014   DDD (degenerative disc disease), lumbar 12/19/2014   Left-sided low back pain with left-sided sciatica    OA (osteoarthritis) of knee 12/07/2014   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 Lytle Michaels TUNNEL RELEASE Left 08/2018   per Dr. Amanda Pea   CESAREAN SECTION  12/1980   COLONOSCOPY  05/26/2022   per Dr. Marina Goodell, adenomatous polyps,  repeat in 5 yrs   CYST REMOVAL HAND Right 09/2021   middle finger per Dr. Amanda Pea   CYST REMOVAL HAND Left 12/2021   ring finger per Dr. Amanda Pea   CYSTOSCOPY W/ URETERAL STENT PLACEMENT Right 02/02/2017   Procedure: CYSTOSCOPY WITH RETROGRADE PYELOGRAM/URETERAL STENT PLACEMENT;  Surgeon: Bjorn Pippin, MD;  Location: [facility];  Service: Urology;  Laterality: Right;   CYSTOSCOPY WITH RETROGRADE PYELOGRAM, URETEROSCOPY AND STENT PLACEMENT Left 12/25/2012   Procedure: CYSTOSCOPY WITH RETROGRADE PYELOGRAM, URETEROSCOPY, EXTRACTION OF  LEFT STONE WITH BASKETAND STENT PLACEMENT;  Surgeon: Milford Cage, MD;  [facility]: [facility];  Service: Urology;  Laterality: Left;  2.5 HRS    CYSTOSCOPY/URETEROSCOPY/HOLMIUM LASER/STENT PLACEMENT Right 02/15/2017   Procedure: CYSTOSCOPY/URETEROSCOPY/HOLMIUM LASER/STENT EXCHANGE;  Surgeon: Crist Fat, MD;  Location: [facility];  Service: Urology;  Laterality: Right;   FOOT SURGERY Bilateral 1987   per Dr. Chaney Malling   HEMATOMA EVACUATION  07/08/2012   Procedure: EVACUATION HEMATOMA;  Surgeon: Barnett Abu, MD;  Location: [facility];  Service: Neurosurgery;  Laterality: N/A;  Cervical Seven-thoracic One Laminectomy and Decompression of Epidural Hematoma   HEMORRHOID BANDING     June and August 2016, Dr Derrell Lolling   KIDNEY STONE SURGERY  12/2003   per Dr. Wanda Plump   LEEP  07/2015   final pathology LGSIL with clear margins negative ECC   LITHOTRIPSY  2007   x 3/Dr. Wanda Plump   LUMBAR LAMINECTOMY  1993   1993, at L5 per Dr. Roxan Hockey   MELANOMA EXCISION  2007   left upper arm per dr. Lonni Fix   NASAL SEPTUM SURGERY  1992   per Dr. Berna Bue   NASAL/SINUS ENDOSCOPY   03/2016   per Dr. Suzanna Obey    POSTERIOR CERVICAL LAMINECTOMY  07/08/2012   Procedure: POSTERIOR CERVICAL LAMINECTOMY;  Surgeon: Barnett Abu, MD;  Location: [facility];  Service: Neurosurgery;  Laterality: N/A;  Cervical Seven- thoracic One Laminectomy and Decompression of Epidural Hematoma   TOTAL HIP ARTHROPLASTY Right 11/2008   [place]io   TOTAL HIP ARTHROPLASTY Left 09/27/2016   Procedure: LEFT TOTAL HIP ARTHROPLASTY ANTERIOR APPROACH;  Surgeon: Ollen Gross, MD;  Location: [facility];  Service: Orthopedics;  Laterality: Left;   TOTAL KNEE ARTHROPLASTY Right 12/07/2014   Procedure: RIGHT TOTAL KNEE ARTHROPLASTY;  Surgeon: Loanne Drilling, MD;  Location: [facility];  Service: Orthopedics;  Laterality: Right;   TRIGGER FINGER RELEASE Left 03/2019   Thumb and middle finger per Dr. Amanda Pea   TUBAL LIGATION  01/1988    OB History     Gravida  1   Para  1   Term  1   Preterm      AB      Living  1      SAB      IAB      Ectopic      Multiple      Live Births               Home Medications    Prior to Admission medications   Medication Sig Start Date End Date Taking? Authorizing Provider  azithromycin (ZITHROMAX) 250 MG tablet Take first 2 tablets together, then 1 every day until finished. 12/02/23  Yes Cleopha Indelicato A, PA-C  betamethasone, augmented, (DIPROLENE) 0.05 % lotion Apply topically daily as needed. 09/12/23  Yes [provider]  promethazine-dextromethorphan (PROMETHAZINE-DM) 6.25-15 MG/5ML syrup Take 5 mLs by mouth every 8 (eight) hours as needed for cough. 12/02/23  Yes Jadavion Spoelstra A, PA-C  tacrolimus (PROTOPIC) 0.1 % ointment Apply topically daily. 09/12/23  Yes [provider]  Acetaminophen (TYLENOL PO) Take by mouth daily as needed.    [provider]  amoxicillin (AMOXIL) 500 MG capsule Take 4 capsules (2,000 mg total) by mouth as needed (dental procedures). 06/12/23   Nelwyn Salisbury, MD  Ascorbic Acid (VITAMIN C) 1000 MG  tablet Take 1,000 mg by mouth daily.    [provider]  B Complex Vitamins (VITAMIN B COMPLEX PO) Take by mouth.    [provider]  betamethasone valerate lotion (VALISONE) 0.1 % Apply topically as needed.    [provider]  CALCIUM CITRATE PO Take 1,000 mg by mouth at bedtime.    [provider]  Cranberry 250 MG CAPS Take 500 mg by mouth 2 (two) times daily.    [provider]  Cyanocobalamin (VITAMIN B 12 PO) Take 500 mcg by mouth daily.    [provider]  diazepam (VALIUM) 5 MG tablet Take 1 or 2 pills 30 minutes before an MRI scan 10/16/22   Nelwyn Salisbury, MD  DOCUSATE SODIUM PO Take 100 mg by mouth as needed. Takes Monday, Wednesday, Friday, Sunday    [provider]  estradiol (ESTRACE VAGINAL) 0.1 MG/GM vaginal cream Place 1 g vaginally 2 (two) times a week. 07/19/23   Olivia Mackie, NP  estradiol (ESTRACE) 0.5 MG tablet Take 1 tablet (0.5 mg total) by mouth daily. 07/17/23   Olivia Mackie, NP  ezetimibe (ZETIA) 10 MG tablet Take 1 tablet (10 mg total) by mouth daily. 02/20/23   Nelwyn Salisbury, MD  FIORICET 50-300-40 MG CAPS Take 1 capsule by mouth 2 (two) times daily as needed (headaches). 02/20/23   Nelwyn Salisbury, MD  LACTOBACILLUS PO Take 5 mg by mouth daily.    [provider]  levocetirizine (XYZAL) 5 MG tablet Take 5 mg by mouth every evening.    [provider]  levothyroxine (SYNTHROID) 75 MCG tablet Take 1 tablet (75 mcg total) by mouth daily. 02/20/23   Nelwyn Salisbury, MD  Multiple Vitamins-Minerals (WOMENS 50+ MULTI VITAMIN/MIN PO) Take 1 tablet by mouth daily.    [provider]  naproxen sodium (ANAPROX) 220 MG tablet Take 220 mg by  mouth daily.     [provider]  nitrofurantoin, macrocrystal-monohydrate, (MACROBID) 100 MG capsule Take 1 capsule (100 mg total) by mouth 2 (two) times daily. 07/17/23   Olivia Mackie, NP  omeprazole (PRILOSEC) 20 MG capsule Take 1  capsule (20 mg total) by mouth daily. 02/20/23   Nelwyn Salisbury, MD  POTASSIUM PO Take 595 mg by mouth at bedtime.    [provider]  predniSONE (DELTASONE) 5 MG tablet Take 1 tablet (5 mg total) by mouth 2 (two) times daily as needed (joint pain). 02/20/23   Nelwyn Salisbury, MD  prochlorperazine (COMPAZINE) 10 MG tablet Take 1 tablet (10 mg total) by mouth every 6 (six) hours as needed. 02/20/23   Nelwyn Salisbury, MD  progesterone (PROMETRIUM) 100 MG capsule Take 1 capsule (100 mg total) by mouth daily. 07/17/23   Olivia Mackie, NP  propranolol (INDERAL) 40 MG tablet Take 1 tablet (40 mg total) by mouth daily. 02/20/23   Nelwyn Salisbury, MD  Pyridoxine HCl (VITAMIN B-6 PO) Take 100 mg by mouth at bedtime.    [provider]  TOBRAMYCIN OP 80 mg. EMPTY 1 CAPSULE IN SINUS RINSE BOTTLE AND USE TWICE DAILY 06/30/18   [provider]  traMADol (ULTRAM) 50 MG tablet Take 2 tablets (100 mg total) by mouth every 6 (six) hours as needed for moderate pain (pain score 4-6). 09/04/23   Nelwyn Salisbury, MD  UNABLE TO FIND as needed. Med Name: Hydrocortisone Lotion USP 2.5%    [provider]    Family History Family History  Problem Relation Age of Onset   Heart disease Mother    Hypertension Mother    Diabetes Mother    Colon polyps Father    Hypertension Father    Leukemia Father    Hypertension Sister    Heart disease Sister    Bladder Cancer Sister    Alzheimer's disease Sister    Stomach cancer Paternal Uncle    Stomach cancer Paternal Grandmother    Colon cancer Neg Hx    Esophageal cancer Neg Hx    Rectal cancer Neg Hx     Social History Social History   Tobacco Use   Smoking status: Former    Current packs/day: 0.00    Types: Cigarettes    Quit date: 12/20/1998    Years since quitting: 24.9   Smokeless tobacco: Never  Vaping Use   Vaping status: Never Used  Substance Use Topics   Alcohol use: Yes    Comment: Very rare   Drug use: No      Allergies   Levaquin [levofloxacin in d5w], Pravastatin sodium [pravachol], Septra [sulfamethoxazole-trimethoprim], Statins, Biaxin [clarithromycin], Celebrex [celecoxib], Ciprofloxacin, Codeine, Doxycycline, Dust mite extract, and Keflex [cephalexin]   Review of Systems Review of Systems  Constitutional:  Positive for fever. Negative for chills.  HENT:  Positive for congestion and sinus pressure. Negative for ear pain and sore throat.   Eyes:  Negative for pain and visual disturbance.  Respiratory:  Positive for cough. Negative for shortness of breath.   Cardiovascular:  Negative for chest pain and palpitations.  Gastrointestinal:  Negative for abdominal pain and vomiting.  Genitourinary:  Negative for dysuria and hematuria.  Musculoskeletal:  Negative for arthralgias and back pain.  Skin:  Negative for color change and rash.  Neurological:  Negative for seizures and syncope.  All other systems reviewed and are negative.    Physical Exam Triage Vital Signs ED Triage Vitals  Encounter Vitals Group     BP 12/02/23 1747 (!) 110/48     Systolic BP Percentile --      Diastolic BP Percentile --      Pulse Rate 12/02/23 1747 99     Resp 12/02/23 1747 20     Temp 12/02/23 1747 99.8 F (37.7 C)     Temp Source 12/02/23 1747 Oral     SpO2 12/02/23 1747 98 %     Weight --      Height --      Head Circumference --      Peak Flow --      Pain Score 12/02/23 1743 9     Pain Loc --      Pain Education --      Exclude from Growth Chart --    No data found.  Updated Vital Signs BP (!) 110/48 (BP Location: Left Arm)   Pulse 99   Temp 99.8 F (37.7 C) (Oral)   Resp 20   SpO2 98%   Visual Acuity Right Eye Distance:   Left Eye Distance:   Bilateral Distance:    Right Eye Near:   Left Eye Near:    Bilateral Near:     Physical Exam Vitals and nursing note reviewed.  Constitutional:      General: She is not in acute distress.    Appearance: She is well-developed.   HENT:     Head: Normocephalic and atraumatic.     Right Ear: Tympanic membrane normal.     Left Ear: Tympanic membrane normal.     Nose: Congestion present.     Mouth/Throat:     Pharynx: Oropharyngeal exudate and posterior oropharyngeal erythema present.  Eyes:     Conjunctiva/sclera: Conjunctivae normal.  Cardiovascular:     Rate and Rhythm: Normal rate and regular rhythm.     Heart sounds: No murmur heard. Pulmonary:     Effort: Pulmonary effort is normal. No respiratory distress.     Breath sounds: Normal breath sounds.  Abdominal:     Palpations: Abdomen is soft.     Tenderness: There is no abdominal tenderness.  Musculoskeletal:        General: No swelling.     Cervical back: Neck supple.  Skin:    General: Skin is warm and dry.     Capillary Refill: Capillary refill takes less than 2 seconds.  Neurological:     Mental Status: She is alert.  Psychiatric:        Mood and Affect: Mood normal.      UC Treatments / Results  Labs (all labs ordered are listed, but only abnormal results are displayed) Labs Reviewed - No data to display  EKG   Radiology No results found.  Procedures Procedures (including critical care time)  Medications Ordered in UC Medications - No data to display  Initial Impression / Assessment and Plan / UC Course  I have reviewed the triage vital signs and the nursing notes.  Pertinent labs & imaging results that were available during my care of the patient were reviewed by me and considered in my medical decision making (see chart for details).     Acute upper respiratory infection   Acute upper respiratory infection. This is likely bacterial in nature. We will treat with the following:  Azithromycin 250mg  Take 2 tablets today and the 1 tablet daily for 4 more days. Promethazine DM 5 mL every 8 hours as needed for cough.  Use caution  as this medication can cause drowsiness.  Rest and stay hydrated.  Return to urgent care or PCP if  symptoms worsen or fail to resolve.    Final Clinical Impressions(s) / UC Diagnoses   Final diagnoses:  Acute upper respiratory infection     Discharge Instructions      Acute upper respiratory infection. This is likely bacterial in nature. We will treat with the following:  Azithromycin 250mg  Take 2 tablets today and the 1 tablet daily for 4 more days. Promethazine DM 5 mL every 8 hours as needed for cough.  Use caution as this medication can cause drowsiness.  Rest and stay hydrated.  Return to urgent care or PCP if symptoms worsen or fail to resolve.     ED Prescriptions     Medication Sig Dispense Auth. Provider   promethazine-dextromethorphan (PROMETHAZINE-DM) 6.25-15 MG/5ML syrup Take 5 mLs by mouth every 8 (eight) hours as needed for cough. 180 mL Joia Doyle A, PA-C   azithromycin (ZITHROMAX) 250 MG tablet Take first 2 tablets together, then 1 every day until finished. 6 tablet [place]ins, [place]      PDMP not reviewed this encounter.   Landis Martins, [place] 12/02/23 [phone]

## 2023-12-02 NOTE — ED Triage Notes (Signed)
Pt c/o cough, body aches, congestion, fevers for 4 days. Taking Sudafed, Mucinex, Alka Seltzer.

## 2023-12-02 NOTE — Discharge Instructions (Addendum)
Acute upper respiratory infection. This is likely bacterial in nature. We will treat with the following:  Azithromycin 250mg  Take 2 tablets today and the 1 tablet daily for 4 more days. Promethazine DM 5 mL every 8 hours as needed for cough.  Use caution as this medication can cause drowsiness.  Rest and stay hydrated.  Return to urgent care or PCP if symptoms worsen or fail to resolve.

## 2023-12-04 ENCOUNTER — Telehealth (HOSPITAL_BASED_OUTPATIENT_CLINIC_OR_DEPARTMENT_OTHER): Payer: PPO | Admitting: Family Medicine

## 2023-12-04 ENCOUNTER — Encounter (HOSPITAL_BASED_OUTPATIENT_CLINIC_OR_DEPARTMENT_OTHER): Payer: Self-pay | Admitting: Family Medicine

## 2023-12-04 ENCOUNTER — Ambulatory Visit: Payer: Self-pay | Admitting: Family Medicine

## 2023-12-04 VITALS — BP 144/69 | HR 66 | Temp 98.1°F | Ht 67.0 in | Wt 182.0 lb

## 2023-12-04 DIAGNOSIS — J069 Acute upper respiratory infection, unspecified: Secondary | ICD-10-CM | POA: Diagnosis not present

## 2023-12-04 DIAGNOSIS — M4690 Unspecified inflammatory spondylopathy, site unspecified: Secondary | ICD-10-CM | POA: Insufficient documentation

## 2023-12-04 MED ORDER — AMOXICILLIN-POT CLAVULANATE 875-125 MG PO TABS: 1.0000 | ORAL_TABLET | Freq: Two times a day (BID) | ORAL | 0 refills | Status: AC

## 2023-12-04 NOTE — Patient Instructions (Signed)
  Rest, drink plenty of fluids.  Tylenol for fever, body aches.   For cough: Take Mucinex DM or Robitussin-DM OTC.  Follow the instructions in the box.  If you have high Blood pressure , use Coricidin HBP for cough. You can use plain (not DM) Mucinex for congestion.   For nasal congestion: -Use over-the-counter Flonase: 2 nasal sprays on each side of the nose in the morning until you feel better  -Use OTC Astepro 2 nasal sprays on each side of the nose twice daily until better  Avoid decongestants such as  Pseudoephedrine or phenylephrine   Take the antibiotic as prescribed    Call if not gradually better over the next  10 days  Call anytime if the symptoms are severe, you have high fever, short of breath, chest pain

## 2023-12-04 NOTE — Telephone Encounter (Signed)
 Copied from CRM [id number]. Topic: Clinical - Red Word Triage >> Dec 04, 2023 10:15 AM Russell PARAS wrote: Red Word that prompted transfer to Nurse Triage: Was seen in [facility] urgent care on Sunday. Did not have Covid or flu testing avail, and were diagnosed with upper respiratory infection, likely bacterial. Was prescirbed azithromycin  and cough syrup.   Began having diarrhea, abdominal pain, nausea, and loss of appetite since beginning the antibiotic. Would like to have provider prescribe another antibiotic due to being unable to tolerate side effects. Still having drainage and cough from illness.   Is wanting to know what precautions to take in consideration with husband due to his history of pneumonia   Chief Complaint: Side effects while taking Azithromycin  Symptoms: diarrhea, abd pain, nausea, loss of appetite, dizziness Disposition: [] ED /[] Urgent Care (no appt availability in office) / [x] Appointment(In office/virtual)/ []  [facility]/ [] Home Care/ [] Refused Recommended Disposition /[] [place]/ []  Follow-up with PCP Additional Notes: Patient was recently seen in urgent care and was prescribed Azithromycin  for pneumonia. She suspects that she is having a reaction to the Azithromycin  and does not want to take it anymore. Patient started the antibiotics on Sunday and the same day she started having diarrhea, abd pain, nausea, loss of appetite, dizziness. Patient wants to be prescribed something else. Appointment scheduled this afternoon. Patient requested vitual appt rather than in-office because she is feeling too sick.  Reason for Disposition  MODERATE diarrhea (e.g., 4-6 times / day more than normal)  Answer Assessment - Initial Assessment Questions 1. ANTIBIOTIC: What antibiotic are you taking? How many times per day?     Azithromycin  250 mg  2. ANTIBIOTIC ONSET: When was the antibiotic started?     Sunday  3. DIARRHEA SEVERITY: How bad is the diarrhea?  How many more stools have you had in the past 24 hours than normal?    - NO DIARRHEA (SCALE 0)   - MILD (SCALE 1-3): Few loose or mushy BMs; increase of 1-3 stools over normal daily number of stools; mild increase in ostomy output.   -  MODERATE (SCALE 4-7): Increase of 4-6 stools daily over normal; moderate increase in ostomy output. * SEVERE (SCALE 8-10; OR 'WORST POSSIBLE'): Increase of 7 or more stools daily over normal; moderate increase in ostomy output; incontinence.     3 episodes of diarrhea in past 24 hours  4. ONSET: When did the diarrhea begin?      Sunday  5. BM CONSISTENCY: How loose or watery is the diarrhea?      Loose stool  6. VOMITING: Are you also vomiting? If Yes, ask: How many times in the past 24 hours?      No  7. ABDOMEN PAIN: Are you having any abdomen pain? If Yes, ask: What does it feel like? (e.g., crampy, dull, intermittent, constant)      Yes  8. ABDOMEN PAIN SEVERITY: If present, ask: How bad is the pain?  (e.g., Scale 1-10; mild, moderate, or severe)   - MILD (1-3): doesn't interfere with normal activities, abdomen soft and not tender to touch    - MODERATE (4-7): interferes with normal activities or awakens from sleep, abdomen tender to touch    - SEVERE (8-10): excruciating pain, doubled over, unable to do any normal activities       3/10  9. OTHER SYMPTOMS: Do you have any other symptoms? (e.g., fever, blood in stool)       Abd pain, nausea, loss  of appetite, dizziness  Protocols used: Diarrhea on Antibiotics-A-AH

## 2023-12-04 NOTE — Progress Notes (Signed)
 Virtual Video Visit  I connected with Victoria Mosley on 12/04/23 at  3:30 PM EST by a video enabled telemedicine application and verified that I am speaking with the correct person using two identifiers.   Location patient: Home Location provider: Clinic Persons participating in the virtual visit: Patient; [name] CMA; Thersia Stark, FNP-C  I discussed the limitations of evaluation and management by telemedicine and the availability of in-person appointments. The patient expressed understanding and agreed to proceed.  Chief Complaint  Patient presents with   Nausea    Called and spoke with pt who states she has been having problems with nausea and diarrhea after beginning abx to help treat pneumonia. States she started on abx 3 days ago.    SUBJECTIVE:  HPI:   Patient is a 73 yo female with hx of migraines, hypothyroidism, and hyperlipidemia who reports she was just recently on a cruise and began having a cough for the last 2 nights of the cruise. She did take a COVId test on the cruise and it was negative. Patient went to UC on 12/02/23 and was started on Promethazine  DM for cough and Zpack for URI. She was not tested for flu as clinic was out of tests.   Patient states she is having nausea, abdominal pain, increased diarrhea since starting Zpack. She is using pepto bismol and TUMS. She states her URI symptoms have improved but the side effects of her azithromycin  are intolerable. She reports improvement of fever and congestion. She is having productive cough with yellow phlegm. She has been using OTC medications for relief. Denies SHOB, Wheezing, chest pain.   The following portions of the patient's history were reviewed and updated as appropriate: medical history, surgical history, medications, allergies, social history, and family history.    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 Eloy 04/30/2017, 2019 negative subtypes 16, 18/45   Hyperlipidemia    IBS (irritable bowel syndrome)    Kidney stones    bilateral   melanoma dx'd 2011   rt arm--surg only   Menopause    sees Dr. Rockney   Migraines    Nephrolithiasis    left   Neurogenic bladder    Normal cardiac stress test 2012   PONV (postoperative nausea and vomiting)    Sepsis (HCC) 01/2016   related to infection caused by kidney stone blockage    Tendonitis    right ankle   Thyroid  disease    Urinary urgency    UTI (urinary tract infection)    started atb on 03-24-12  Past Surgical History:  Procedure Laterality Date   BACK SURGERY  10/1991   L5 ruptured disc- Dr.Robinson   CARPAL TUNNEL RELEASE Right 06/2018   per Dr. Camella CARRY TUNNEL RELEASE Left 08/2018   per Dr. Camella   CESAREAN SECTION  12/1980   COLONOSCOPY  05/26/2022   per Dr. Abran, adenomatous polyps,  repeat in 5 yrs   CYST REMOVAL HAND Right 09/2021   middle finger per Dr. Camella   CYST REMOVAL HAND Left 12/2021   ring finger per Dr. Camella   CYSTOSCOPY W/ URETERAL STENT PLACEMENT Right 02/02/2017   Procedure: CYSTOSCOPY WITH RETROGRADE PYELOGRAM/URETERAL STENT PLACEMENT;  Surgeon: Norleen Seltzer, MD;  Location: [facility];  Service: Urology;  Laterality: Right;   CYSTOSCOPY WITH RETROGRADE PYELOGRAM,  URETEROSCOPY AND STENT PLACEMENT Left 12/25/2012   Procedure: CYSTOSCOPY WITH RETROGRADE PYELOGRAM, URETEROSCOPY, EXTRACTION OF  LEFT STONE WITH BASKETAND STENT PLACEMENT;  Surgeon: Toribio Neysa Repine, MD;  Location: [facility];  Service: Urology;  Laterality: Left;  2.5 HRS    CYSTOSCOPY/URETEROSCOPY/HOLMIUM LASER/STENT PLACEMENT Right 02/15/2017   Procedure: CYSTOSCOPY/URETEROSCOPY/HOLMIUM LASER/STENT EXCHANGE;  Surgeon: Morene LELON Salines, MD;  Location: [facility];  Service: Urology;  Laterality: Right;   FOOT SURGERY Bilateral 1987   per Dr. Van   HEMATOMA EVACUATION  07/08/2012   Procedure: EVACUATION HEMATOMA;  Surgeon: Victory Gens, MD;  Location: [facility];  Service: Neurosurgery;  Laterality: N/A;  Cervical Seven-thoracic One Laminectomy and Decompression of Epidural Hematoma   HEMORRHOID BANDING     June and August 2016, Dr Rubin   KIDNEY STONE SURGERY  12/2003   per Dr. Amiel   LEEP  07/2015   final pathology LGSIL with clear margins negative ECC   LITHOTRIPSY  2007   x 3/Dr. Amiel   LUMBAR LAMINECTOMY  1993   1993, at L5 per Dr. Lang   MELANOMA EXCISION  2007   left upper arm per dr. Scot   NASAL SEPTUM SURGERY  1992   per Dr. Ann   NASAL/SINUS ENDOSCOPY  03/2016   per Dr. Norleen Notice    POSTERIOR CERVICAL LAMINECTOMY  07/08/2012   Procedure: POSTERIOR CERVICAL LAMINECTOMY;  Surgeon: Victory Gens, MD;  Location: [facility];  Service: Neurosurgery;  Laterality: N/A;  Cervical Seven- thoracic One Laminectomy and Decompression of Epidural Hematoma   TOTAL HIP ARTHROPLASTY Right 11/2008   Aluscio   TOTAL HIP ARTHROPLASTY Left 09/27/2016   Procedure: LEFT TOTAL HIP ARTHROPLASTY ANTERIOR APPROACH;  Surgeon: Dempsey Moan, MD;  Location: [facility];  Service: Orthopedics;  Laterality: Left;   TOTAL KNEE ARTHROPLASTY Right 12/07/2014   Procedure: RIGHT TOTAL KNEE ARTHROPLASTY;  Surgeon: Dempsey Moan GAILS, MD;  Location: [facility];  Service:  Orthopedics;  Laterality: Right;   TRIGGER FINGER RELEASE Left 03/2019   Thumb and middle finger per Dr. Camella   TUBAL LIGATION  01/1988     Current Outpatient Medications:    Acetaminophen  (TYLENOL  PO), Take by mouth daily as needed., Disp: , Rfl:    amoxicillin  (AMOXIL ) 500 MG capsule, Take 4 capsules (2,000 mg total) by mouth as needed (dental procedures)., Disp: 12 capsule, Rfl: 3   amoxicillin -clavulanate (AUGMENTIN ) 875-125 MG tablet, Take 1 tablet by mouth 2 (two) times daily for 7 days., Disp: 14 tablet, Rfl: 0   Ascorbic Acid  (VITAMIN C ) 1000 MG tablet, Take 1,000 mg by mouth daily., Disp: , Rfl:    B Complex Vitamins (VITAMIN B COMPLEX PO), Take by mouth., Disp: , Rfl:  betamethasone  valerate lotion (VALISONE ) 0.1 %, Apply topically as needed., Disp: , Rfl:    betamethasone , augmented, (DIPROLENE ) 0.05 % lotion, Apply topically daily as needed., Disp: , Rfl:    CALCIUM CITRATE PO, Take 1,000 mg by mouth at bedtime., Disp: , Rfl:    Cranberry 250 MG CAPS, Take 500 mg by mouth 2 (two) times daily., Disp: , Rfl:    Cyanocobalamin  (VITAMIN B 12 PO), Take 500 mcg by mouth daily., Disp: , Rfl:    DOCUSATE SODIUM  PO, Take 100 mg by mouth as needed. Takes Monday, Wednesday, Friday, Sunday, Disp: , Rfl:    estradiol  (ESTRACE ) 0.5 MG tablet, Take 1 tablet (0.5 mg total) by mouth daily., Disp: 100 tablet, Rfl: 3   ezetimibe  (ZETIA ) 10 MG tablet, Take 1 tablet (10 mg total) by mouth daily., Disp: 100 tablet, Rfl: 3   FIORICET  50-300-40 MG CAPS, Take 1 capsule by mouth 2 (two) times daily as needed (headaches)., Disp: 60 capsule, Rfl: 5   LACTOBACILLUS PO, Take 5 mg by mouth daily., Disp: , Rfl:    levocetirizine (XYZAL) 5 MG tablet, Take 5 mg by mouth every evening., Disp: , Rfl:    levothyroxine  (SYNTHROID ) 75 MCG tablet, Take 1 tablet (75 mcg total) by mouth daily., Disp: 100 tablet, Rfl: 3   Multiple Vitamins-Minerals (WOMENS 50+ MULTI VITAMIN/MIN PO), Take 1 tablet by mouth daily.,  Disp: , Rfl:    naproxen sodium (ANAPROX) 220 MG tablet, Take 220 mg by mouth daily. , Disp: , Rfl:    omeprazole  (PRILOSEC) 20 MG capsule, Take 1 capsule (20 mg total) by mouth daily., Disp: 90 capsule, Rfl: 3   POTASSIUM PO, Take 595 mg by mouth at bedtime., Disp: , Rfl:    predniSONE  (DELTASONE ) 5 MG tablet, Take 1 tablet (5 mg total) by mouth 2 (two) times daily as needed (joint pain)., Disp: 100 tablet, Rfl: 3   prochlorperazine  (COMPAZINE ) 10 MG tablet, Take 1 tablet (10 mg total) by mouth every 6 (six) hours as needed., Disp: 100 tablet, Rfl: 3   progesterone  (PROMETRIUM ) 100 MG capsule, Take 1 capsule (100 mg total) by mouth daily., Disp: 100 capsule, Rfl: 3   propranolol  (INDERAL ) 40 MG tablet, Take 1 tablet (40 mg total) by mouth daily., Disp: 100 tablet, Rfl: 3   Pyridoxine HCl (VITAMIN B-6 PO), Take 100 mg by mouth at bedtime., Disp: , Rfl:    tacrolimus (PROTOPIC) 0.1 % ointment, Apply topically daily., Disp: , Rfl:    TOBRAMYCIN OP, 80 mg. EMPTY 1 CAPSULE IN SINUS RINSE BOTTLE AND USE TWICE DAILY, Disp: , Rfl:    traMADol  (ULTRAM ) 50 MG tablet, Take 2 tablets (100 mg total) by mouth every 6 (six) hours as needed for moderate pain (pain score 4-6)., Disp: 100 tablet, Rfl: 3   UNABLE TO FIND, as needed. Med Name: Hydrocortisone  Lotion USP 2.5%, Disp: , Rfl:    diazepam  (VALIUM ) 5 MG tablet, Take 1 or 2 pills 30 minutes before an MRI scan (Patient not taking: Reported on 12/04/2023), Disp: 10 tablet, Rfl: 0 Allergies  Allergen Reactions   Levaquin  [Levofloxacin  In D5w] Anaphylaxis, Rash and Other (See Comments)    Hot, dizziness, diarrhea. Tolerated Ciprofloxacin  12/2017   Pravastatin Sodium [Pravachol]     Joint pain(severe)   Septra  [Sulfamethoxazole -Trimethoprim ] Anaphylaxis    diarrhea   Statins     Joint Pain   Azithromycin  Nausea And Vomiting   Biaxin [Clarithromycin] Nausea And Vomiting   Celebrex [Celecoxib]     Severe  GI upset   Ciprofloxacin  Nausea Only    Severe GI  upset   Codeine Nausea And Vomiting   Doxycycline      Stomach Upset    Dust Mite Extract Other (See Comments)      Grass, trees also- cause Runny nose, sneezing (allergy symptoms)   Keflex  [Cephalexin ] Itching    Stomach upset    Social History   Socioeconomic History   Marital status: Married    Spouse name: Elspeth   Number of children: 1   Years of education: Not on file   Highest education level: 12th grade  Occupational History   Occupation: Film/video editor  Tobacco Use   Smoking status: Former    Current packs/day: 0.00    Types: Cigarettes    Quit date: 12/20/1998    Years since quitting: 24.9   Smokeless tobacco: Never  Vaping Use   Vaping status: Never Used  Substance and Sexual Activity   Alcohol use: Yes    Comment: Very rare   Drug use: No   Sexual activity: Yes    Partners: Male    Birth control/protection: Post-menopausal    Comment: 1st intercourse 81 yo-2 partners  Other Topics Concern   Not on file  Social History Narrative   Not on file   Social Drivers of Health   Financial Resource Strain: Low Risk  (10/13/2022)   Overall Financial Resource Strain (CARDIA)    Difficulty of Paying Living Expenses: Not hard at all  Food Insecurity: No Food Insecurity (10/13/2022)   Hunger Vital Sign    Worried About Running Out of Food in the Last Year: Never true    Ran Out of Food in the Last Year: Never true  Transportation Needs: No Transportation Needs (10/13/2022)   PRAPARE - Administrator, Civil Service (Medical): No    Lack of Transportation (Non-Medical): No  Physical Activity: Inactive (12/04/2023)   Exercise Vital Sign    Days of Exercise per Week: 0 days    Minutes of Exercise per Session: 0 min  Stress: No Stress Concern Present (12/04/2023)   [employer] of Occupational Health - Occupational Stress Questionnaire    Feeling of Stress : Not at all  Social Connections: Socially Integrated (10/13/2022)   Social Connection and  Isolation Panel [NHANES]    Frequency of Communication with Friends and Family: More than three times a week    Frequency of Social Gatherings with Friends and Family: Once a week    Attends Religious Services: 1 to 4 times per year    Active Member of [employer] or Organizations: Yes    Attends Engineer, Structural: More than 4 times per year    Marital Status: Married  Catering Manager Violence: Not At Risk (08/31/2022)   Humiliation, Afraid, Rape, and Kick questionnaire    Fear of Current or Ex-Partner: No    Emotionally Abused: No    Physically Abused: No    Sexually Abused: No    Family History  Problem Relation Age of Onset   Heart disease Mother    Hypertension Mother    Diabetes Mother    Colon polyps Father    Hypertension Father    Leukemia Father    Hypertension Sister    Heart disease Sister    Bladder Cancer Sister    Alzheimer's disease Sister    Stomach cancer Paternal Uncle    Stomach cancer Paternal Grandmother    Colon cancer Neg Hx  Esophageal cancer Neg Hx    Rectal cancer Neg Hx      ROS: A complete ROS was performed with pertinent positives/negatives noted in the HPI. The remainder of the ROS are negative.    OBJECTIVE:  VITALS per patient if applicable: Today's Vitals   12/04/23 1523  BP: (!) 144/69  Pulse: 66  Temp: 98.1 F (36.7 C)  TempSrc: Oral  Weight: 182 lb (82.6 kg)  Height: 5' 7 (1.702 m)   Body mass index is 28.51 kg/m.   GENERAL: Alert and oriented. Appears well and in no acute distress. HEENT: Atraumatic. Conjunctiva clear. No obvious abnormalities on inspection of external nose and ears. NECK: Normal movements of the head and neck. LUNGS: On inspection, no signs of respiratory distress. Breathing rate appears normal. No obvious gross SOB, gasping or wheezing, and no conversational dyspnea. Voice is slightly hoarse.  CV: No obvious cyanosis. MS: Moves all visible extremities without noticeable  abnormality. PSYCH/NEURO: Pleasant and cooperative. No obvious depression or anxiety. Speech and thought processing grossly intact.  ASSESSMENT AND PLAN: 1. Acute URI (Primary) Stop Azithromycin . Updated to allergy list per pt request. Will start Augmentin  as patient is able to tolerate. Continue OTC meds w/ monitoring of BP and recommend daily use of probiotic. If no improvement or worsening in 48 hours, reach out to PCP.  - amoxicillin -clavulanate (AUGMENTIN ) 875-125 MG tablet; Take 1 tablet by mouth 2 (two) times daily for 7 days.  Dispense: 14 tablet; Refill: 0    I discussed the assessment and treatment plan with the patient. The patient was provided an opportunity to ask questions and all were answered. The patient agreed with the plan and demonstrated an understanding of the instructions.   The patient was advised to call back or seek an in-person evaluation if the symptoms worsen or if the condition fails to improve as anticipated.  I provided 8 minutes of non-face-to-face time during this encounter.  Return if symptoms worsen or fail to improve.  Thersia Schuyler Stark, [place]

## 2023-12-06 ENCOUNTER — Other Ambulatory Visit: Payer: Self-pay | Admitting: Family Medicine

## 2023-12-06 NOTE — Telephone Encounter (Signed)
 She had a virtual visit for this and was given Augmentin 

## 2023-12-11 ENCOUNTER — Telehealth: Payer: Self-pay | Admitting: Family Medicine

## 2023-12-11 NOTE — Telephone Encounter (Signed)
The medication FIORICET 50-300-40 MG CAPS  Is now 500 dollars for 60 pills patient is going have the pharmacy give the dr a call regarding this issue patient can not afford the medication she is requesting the generic brand which will be cheaper for her .

## 2023-12-13 NOTE — Telephone Encounter (Signed)
E2C2 called and she will let pt know md out of the office until 12-17-2023

## 2023-12-21 MED ORDER — BUTALBITAL-APAP-CAFFEINE 50-300-40 MG PO CAPS: 1.0000 | ORAL_CAPSULE | Freq: Two times a day (BID) | ORAL | 5 refills | Status: DC | PRN

## 2023-12-21 NOTE — Telephone Encounter (Signed)
I sent in the generic version.

## 2024-01-01 ENCOUNTER — Telehealth: Payer: Self-pay | Admitting: Family Medicine

## 2024-01-01 NOTE — Telephone Encounter (Signed)
 Copied from CRM [id number]. Topic: Clinical - Prescription Issue >> Jan 01, 2024 11:01 AM Florestine Avers wrote: Reason for CRM: Patient wants to know if Dr. Clent Ridges sent over the generic version for Butalbital-APAP-Caffeine (FIORICET) 50-300-40 MG CAPS being that the name brand costs too much.

## 2024-01-14 ENCOUNTER — Other Ambulatory Visit: Payer: Self-pay | Admitting: Family Medicine

## 2024-01-21 DIAGNOSIS — H52203 Unspecified astigmatism, bilateral: Secondary | ICD-10-CM | POA: Diagnosis not present

## 2024-01-21 DIAGNOSIS — H25013 Cortical age-related cataract, bilateral: Secondary | ICD-10-CM | POA: Diagnosis not present

## 2024-01-21 DIAGNOSIS — H2513 Age-related nuclear cataract, bilateral: Secondary | ICD-10-CM | POA: Diagnosis not present

## 2024-02-19 DIAGNOSIS — L281 Prurigo nodularis: Secondary | ICD-10-CM | POA: Diagnosis not present

## 2024-02-19 DIAGNOSIS — L218 Other seborrheic dermatitis: Secondary | ICD-10-CM | POA: Diagnosis not present

## 2024-03-03 DIAGNOSIS — N3020 Other chronic cystitis without hematuria: Secondary | ICD-10-CM | POA: Diagnosis not present

## 2024-03-03 DIAGNOSIS — N200 Calculus of kidney: Secondary | ICD-10-CM | POA: Diagnosis not present

## 2024-03-05 ENCOUNTER — Ambulatory Visit: Payer: PPO

## 2024-03-05 ENCOUNTER — Telehealth: Payer: Self-pay | Admitting: Family Medicine

## 2024-03-05 ENCOUNTER — Ambulatory Visit (INDEPENDENT_AMBULATORY_CARE_PROVIDER_SITE_OTHER): Payer: PPO | Admitting: Family Medicine

## 2024-03-05 ENCOUNTER — Encounter: Payer: Self-pay | Admitting: Family Medicine

## 2024-03-05 VITALS — BP 124/70 | HR 66 | Temp 99.3°F | Ht 67.0 in | Wt 184.0 lb

## 2024-03-05 DIAGNOSIS — Z136 Encounter for screening for cardiovascular disorders: Secondary | ICD-10-CM

## 2024-03-05 DIAGNOSIS — Z0000 Encounter for general adult medical examination without abnormal findings: Secondary | ICD-10-CM

## 2024-03-05 LAB — CBC WITH DIFFERENTIAL/PLATELET
Basophils Absolute: 0 10*3/uL (ref 0.0–0.1)
Basophils Relative: 0.2 % (ref 0.0–3.0)
Eosinophils Absolute: 0.2 10*3/uL (ref 0.0–0.7)
Eosinophils Relative: 2.4 % (ref 0.0–5.0)
HCT: 47.1 % — ABNORMAL HIGH (ref 36.0–46.0)
Hemoglobin: 15.7 g/dL — ABNORMAL HIGH (ref 12.0–15.0)
Lymphocytes Relative: 43 % (ref 12.0–46.0)
Lymphs Abs: 3.1 10*3/uL (ref 0.7–4.0)
MCHC: 33.4 g/dL (ref 30.0–36.0)
MCV: 91 fl (ref 78.0–100.0)
Monocytes Absolute: 0.6 10*3/uL (ref 0.1–1.0)
Monocytes Relative: 8.5 % (ref 3.0–12.0)
Neutro Abs: 3.3 10*3/uL (ref 1.4–7.7)
Neutrophils Relative %: 45.9 % (ref 43.0–77.0)
Platelets: 215 10*3/uL (ref 150.0–400.0)
RBC: 5.18 Mil/uL — ABNORMAL HIGH (ref 3.87–5.11)
RDW: 12.8 % (ref 11.5–15.5)
WBC: 7.3 10*3/uL (ref 4.0–10.5)

## 2024-03-05 LAB — HEPATIC FUNCTION PANEL
ALT: 22 U/L (ref 0–35)
AST: 23 U/L (ref 0–37)
Albumin: 4.4 g/dL (ref 3.5–5.2)
Alkaline Phosphatase: 58 U/L (ref 39–117)
Bilirubin, Direct: 0.1 mg/dL (ref 0.0–0.3)
Total Bilirubin: 0.5 mg/dL (ref 0.2–1.2)
Total Protein: 7.1 g/dL (ref 6.0–8.3)

## 2024-03-05 LAB — LIPID PANEL
Cholesterol: 213 mg/dL — ABNORMAL HIGH (ref 0–200)
HDL: 41.9 mg/dL (ref >39.00)
LDL Cholesterol: 108 mg/dL — ABNORMAL HIGH (ref 0–99)
NonHDL: 171.02
Total CHOL/HDL Ratio: 5
Triglycerides: 314 mg/dL — ABNORMAL HIGH (ref 0.0–149.0)
VLDL: 62.8 mg/dL — ABNORMAL HIGH (ref 0.0–40.0)

## 2024-03-05 LAB — TSH: TSH: 2.9 u[IU]/mL (ref 0.35–5.50)

## 2024-03-05 LAB — BASIC METABOLIC PANEL WITH GFR
BUN: 16 mg/dL (ref 6–23)
CO2: 26 meq/L (ref 19–32)
Calcium: 9.6 mg/dL (ref 8.4–10.5)
Chloride: 103 meq/L (ref 96–112)
Creatinine, Ser: 0.72 mg/dL (ref 0.40–1.20)
GFR: 83.88 mL/min (ref >60.00)
Glucose, Bld: 117 mg/dL — ABNORMAL HIGH (ref 70–99)
Potassium: 4 meq/L (ref 3.5–5.1)
Sodium: 140 meq/L (ref 135–145)

## 2024-03-05 LAB — HEMOGLOBIN A1C: Hgb A1c MFr Bld: 6.8 % — ABNORMAL HIGH (ref 4.6–6.5)

## 2024-03-05 MED ORDER — HYDROXYZINE HCL 25 MG PO TABS: 25.0000 mg | ORAL_TABLET | Freq: Every day | ORAL | 3 refills | Status: DC

## 2024-03-05 MED ORDER — LEVOTHYROXINE SODIUM 75 MCG PO TABS: 75.0000 ug | ORAL_TABLET | Freq: Every day | ORAL | 3 refills | Status: AC

## 2024-03-05 MED ORDER — EZETIMIBE 10 MG PO TABS: 10.0000 mg | ORAL_TABLET | Freq: Every day | ORAL | 3 refills | Status: DC

## 2024-03-05 MED ORDER — OMEPRAZOLE 20 MG PO CPDR: 20.0000 mg | DELAYED_RELEASE_CAPSULE | Freq: Every day | ORAL | 3 refills | Status: DC

## 2024-03-05 MED ORDER — PROPRANOLOL HCL 40 MG PO TABS: 40.0000 mg | ORAL_TABLET | Freq: Every day | ORAL | 3 refills | Status: AC

## 2024-03-05 NOTE — Progress Notes (Signed)
   Subjective:    Patient ID: Piper Marcial Denning, female    DOB: 1951-08-29, 73 y.o.   MRN: [id number]  HPI Here for a well exam. She feels great.    Review of Systems  Constitutional: Negative.   HENT: Negative.    Eyes: Negative.   Respiratory: Negative.    Cardiovascular: Negative.   Gastrointestinal: Negative.   Genitourinary:  Negative for decreased urine volume, difficulty urinating, dyspareunia, dysuria, enuresis, flank pain, frequency, hematuria, pelvic pain and urgency.  Musculoskeletal: Negative.   Skin: Negative.   Neurological: Negative.  Negative for headaches.  Psychiatric/Behavioral: Negative.         Objective:   Physical Exam Constitutional:      General: She is not in acute distress.    Appearance: Normal appearance. She is well-developed.  HENT:     Head: Normocephalic and atraumatic.     Right Ear: External ear normal.     Left Ear: External ear normal.     Nose: Nose normal.     Mouth/Throat:     Pharynx: No oropharyngeal exudate.  Eyes:     General: No scleral icterus.    Conjunctiva/sclera: Conjunctivae normal.     Pupils: Pupils are equal, round, and reactive to light.  Neck:     Thyroid : No thyromegaly.     Vascular: No JVD.  Cardiovascular:     Rate and Rhythm: Normal rate and regular rhythm.     Pulses: Normal pulses.     Heart sounds: Normal heart sounds. No murmur heard.    No friction rub. No gallop.  Pulmonary:     Effort: Pulmonary effort is normal. No respiratory distress.     Breath sounds: Normal breath sounds. No wheezing or rales.  Chest:     Chest wall: No tenderness.  Abdominal:     General: Bowel sounds are normal. There is no distension.     Palpations: Abdomen is soft. There is no mass.     Tenderness: There is no abdominal tenderness. There is no guarding or rebound.  Musculoskeletal:        General: No tenderness. Normal range of motion.     Cervical back: Normal range of motion and neck supple.  Lymphadenopathy:      Cervical: No cervical adenopathy.  Skin:    General: Skin is warm and dry.     Findings: No erythema or rash.  Neurological:     General: No focal deficit present.     Mental Status: She is alert and oriented to person, place, and time.     Cranial Nerves: No cranial nerve deficit.     Motor: No abnormal muscle tone.     Coordination: Coordination normal.     Deep Tendon Reflexes: Reflexes are normal and symmetric. Reflexes normal.  Psychiatric:        Mood and Affect: Mood normal.        Behavior: Behavior normal.        Thought Content: Thought content normal.        Judgment: Judgment normal.           Assessment & Plan:  Well exam. We discussed diet and exercise. Get fasting labs. Per her request we will order a cardiac CT calcium screen.  Corita Diego, MD

## 2024-03-06 ENCOUNTER — Encounter: Payer: Self-pay | Admitting: *Deleted

## 2024-03-07 ENCOUNTER — Other Ambulatory Visit: Payer: Self-pay | Admitting: *Deleted

## 2024-03-07 MED ORDER — TRAMADOL HCL 50 MG PO TABS: 100.0000 mg | ORAL_TABLET | Freq: Four times a day (QID) | ORAL | 0 refills | Status: DC | PRN

## 2024-03-31 DIAGNOSIS — Z1231 Encounter for screening mammogram for malignant neoplasm of breast: Secondary | ICD-10-CM | POA: Diagnosis not present

## 2024-03-31 LAB — HM MAMMOGRAPHY

## 2024-04-02 ENCOUNTER — Encounter: Payer: Self-pay | Admitting: Family Medicine

## 2024-04-27 ENCOUNTER — Other Ambulatory Visit: Payer: Self-pay | Admitting: Family Medicine

## 2024-04-30 ENCOUNTER — Ambulatory Visit (HOSPITAL_BASED_OUTPATIENT_CLINIC_OR_DEPARTMENT_OTHER)
Admission: RE | Admit: 2024-04-30 | Discharge: 2024-04-30 | Disposition: A | Discharge status: Home / Self Care | Payer: Self-pay | Source: Ambulatory Visit | Attending: Family Medicine | Admitting: Family Medicine

## 2024-04-30 DIAGNOSIS — Z136 Encounter for screening for cardiovascular disorders: Secondary | ICD-10-CM | POA: Insufficient documentation

## 2024-05-01 ENCOUNTER — Ambulatory Visit: Payer: Self-pay | Admitting: Family Medicine

## 2024-05-01 DIAGNOSIS — Z136 Encounter for screening for cardiovascular disorders: Secondary | ICD-10-CM

## 2024-05-24 ENCOUNTER — Encounter (HOSPITAL_COMMUNITY): Payer: Self-pay | Admitting: *Deleted

## 2024-05-24 ENCOUNTER — Ambulatory Visit (HOSPITAL_COMMUNITY)
Admission: EM | Admit: 2024-05-24 | Discharge: 2024-05-24 | Disposition: A | Discharge status: Home / Self Care | Attending: Physician Assistant | Admitting: Physician Assistant

## 2024-05-24 ENCOUNTER — Other Ambulatory Visit: Payer: Self-pay

## 2024-05-24 DIAGNOSIS — B029 Zoster without complications: Secondary | ICD-10-CM

## 2024-05-24 MED ORDER — VALACYCLOVIR HCL 1 G PO TABS: 1000.0000 mg | ORAL_TABLET | Freq: Three times a day (TID) | ORAL | 0 refills | Status: DC

## 2024-05-24 NOTE — ED Provider Notes (Signed)
 [facility]    CSN: [id number] Arrival date & time: 05/24/24  1105      History   Chief Complaint Chief Complaint  Patient presents with   Rash    HPI Victoria Mosley is a 73 y.o. female.   Patient presents with rash to her right thenar eminence, right lateral forearm, and right upper arm.  She reports initially 5 days ago she was having pain over the thenar eminence and then the rash appeared.  Rash later noted to the right forearm.  She reports last night noticing new blisters to the right upper arm.  Rash is painful, mildly itchy.  She initially started prednisone  with minimal relief.    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 Eloy 04/30/2017, 2019 negative subtypes 16, 18/45   Hyperlipidemia    IBS (irritable bowel syndrome)    Kidney stones    bilateral   melanoma dx'd 2011   rt arm--surg only   Menopause    sees Dr. Rockney   Migraines    Nephrolithiasis    left   Neurogenic bladder    Normal cardiac stress test 2012   PONV (postoperative nausea and vomiting)    Sepsis (HCC) 01/2016   related to infection caused by kidney stone blockage    Tendonitis    right ankle   Thyroid  disease    Urinary urgency    UTI (urinary tract  infection)    started atb on 03-24-12    Patient Active Problem List   Diagnosis Date Noted   Spondylitis (HCC) 12/04/2023   Pain in left foot 09/06/2020   Menopause    Chronic UTI (urinary tract infection)    Impingement syndrome of left shoulder region 01/06/2020   Hypothyroidism 11/24/2019   Acquired trigger finger of left middle finger 05/01/2019   Trigger thumb of left hand 05/01/2019   Trigger finger 11/25/2018   Tibialis posterior tendinitis 10/10/2018   Arthritis of hand 07/19/2018   Carpal tunnel syndrome on both sides 06/30/2018   Kidney stone 03/11/2018   Migraine 03/11/2018   Left ankle pain 01/02/2018   Chronic maxillary sinusitis 09/28/2017   Cervical radiculopathy 06/06/2017   OA (osteoarthritis) of hip 09/27/2016   Dizziness 04/17/2016   Lumbar spondylosis with myelopathy 01/28/2015   Intractable back pain 12/19/2014   Sciatica of left side 12/19/2014   Migraines 12/19/2014   DDD (degenerative disc disease), lumbar 12/19/2014   Left-sided low back pain with left-sided sciatica    OA (osteoarthritis) of knee 12/07/2014   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 Camella CARRY TUNNEL RELEASE Left 08/2018   per Dr. Camella   CESAREAN SECTION  12/1980   COLONOSCOPY  05/26/2022   per Dr. Abran, adenomatous polyps,  repeat in 5 yrs   CYST REMOVAL HAND Right 09/2021   middle finger per Dr. Camella   CYST REMOVAL HAND Left 12/2021   ring finger  per Dr. Camella   CYSTOSCOPY W/ URETERAL STENT PLACEMENT Right 02/02/2017   Procedure: CYSTOSCOPY WITH RETROGRADE PYELOGRAM/URETERAL STENT PLACEMENT;  Surgeon: Norleen Seltzer, MD;  Location: [facility];  Service: Urology;  Laterality: Right;   CYSTOSCOPY WITH RETROGRADE PYELOGRAM, URETEROSCOPY AND STENT PLACEMENT Left 12/25/2012   Procedure: CYSTOSCOPY WITH RETROGRADE PYELOGRAM, URETEROSCOPY, EXTRACTION OF  LEFT STONE WITH BASKETAND STENT PLACEMENT;  Surgeon: Toribio Neysa Repine, MD;  Location: [facility];  Service: Urology;  Laterality: Left;  2.5 HRS    CYSTOSCOPY/URETEROSCOPY/HOLMIUM LASER/STENT PLACEMENT Right 02/15/2017   Procedure: CYSTOSCOPY/URETEROSCOPY/HOLMIUM LASER/STENT EXCHANGE;  Surgeon: Morene LELON Salines, MD;  Location: [facility];  Service: Urology;  Laterality: Right;   FOOT SURGERY Bilateral 1987   per Dr. Van   HEMATOMA EVACUATION  07/08/2012   Procedure: EVACUATION HEMATOMA;  Surgeon: Victory Gens, MD;  Location: [facility];  Service: Neurosurgery;  Laterality: N/A;  Cervical Seven-thoracic One Laminectomy and Decompression of Epidural Hematoma   HEMORRHOID BANDING     June and August 2016, Dr Rubin   KIDNEY STONE SURGERY  12/2003   per Dr. Amiel   LEEP  07/2015   final pathology LGSIL with clear margins negative ECC   LITHOTRIPSY  2007   x 3/Dr. Amiel   LUMBAR LAMINECTOMY  1993   1993, at L5 per Dr. Lang   MELANOMA EXCISION  2007   left upper arm per dr. Scot   NASAL SEPTUM SURGERY  1992   per Dr. Ann   NASAL/SINUS ENDOSCOPY  03/2016   per Dr. Norleen Notice    POSTERIOR CERVICAL LAMINECTOMY  07/08/2012   Procedure: POSTERIOR CERVICAL LAMINECTOMY;  Surgeon: Victory Gens, MD;  Location: [facility];  Service: Neurosurgery;  Laterality: N/A;  Cervical Seven- thoracic One Laminectomy and Decompression of Epidural Hematoma   TOTAL HIP ARTHROPLASTY Right 11/2008   Aluscio   TOTAL HIP ARTHROPLASTY Left 09/27/2016   Procedure: LEFT TOTAL HIP  ARTHROPLASTY ANTERIOR APPROACH;  Surgeon: Dempsey Moan, MD;  Location: [facility];  Service: Orthopedics;  Laterality: Left;   TOTAL KNEE ARTHROPLASTY Right 12/07/2014   Procedure: RIGHT TOTAL KNEE ARTHROPLASTY;  Surgeon: Dempsey Moan GAILS, MD;  Location: [facility];  Service: Orthopedics;  Laterality: Right;   TRIGGER FINGER RELEASE Left 03/2019   Thumb and middle finger per Dr. Camella   TUBAL LIGATION  01/1988    OB History     Gravida  1   Para  1   Term  1   Preterm      AB      Living  1  SAB      IAB      Ectopic      Multiple      Live Births               Home Medications    Prior to Admission medications   Medication Sig Start Date End Date Taking? Authorizing Provider  valACYclovir  (VALTREX ) 1000 MG tablet Take 1 tablet (1,000 mg total) by mouth 3 (three) times daily. 05/24/24  Yes Ward, Harlene PEDLAR, PA-C  Acetaminophen  (TYLENOL  PO) Take by mouth daily as needed.    [provider]  amoxicillin  (AMOXIL ) 500 MG capsule Take 4 capsules (2,000 mg total) by mouth as needed (dental procedures). 06/12/23   [name] LABOR, MD  Ascorbic Acid  (VITAMIN C ) 1000 MG tablet Take 1,000 mg by mouth daily.    [provider]  B Complex Vitamins (VITAMIN B COMPLEX PO) Take by mouth.    [provider]  betamethasone  valerate lotion (VALISONE ) 0.1 % Apply topically as needed.    [provider]  betamethasone , augmented, (DIPROLENE ) 0.05 % lotion Apply topically daily as needed. 09/12/23   [provider]  Butalbital -APAP-Caffeine  (FIORICET ) 50-300-40 MG CAPS Take 1 capsule by mouth 2 (two) times daily as needed (headaches). 12/21/23   [name] LABOR, MD  CALCIUM CITRATE PO Take 1,000 mg by mouth at bedtime.    [provider]  Cranberry 250 MG CAPS Take 500 mg by mouth 2 (two) times daily.    [provider]  Cyanocobalamin  (VITAMIN B 12 PO) Take 500 mcg by mouth daily.    [provider]  DOCUSATE SODIUM  PO  Take 100 mg by mouth as needed. Takes Monday, Wednesday, Friday, Sunday    [provider]  estradiol  (ESTRACE ) 0.5 MG tablet Take 1 tablet (0.5 mg total) by mouth daily. 07/17/23   Prentiss Annabella LABOR, NP  ezetimibe  (ZETIA ) 10 MG tablet Take 1 tablet (10 mg total) by mouth daily. 03/05/24   [name] LABOR, MD  hydrOXYzine  (ATARAX ) 25 MG tablet Take 1 tablet (25 mg total) by mouth at bedtime. 03/05/24   [name] LABOR, MD  LACTOBACILLUS PO Take 5 mg by mouth daily.    [provider]  levocetirizine (XYZAL) 5 MG tablet Take 5 mg by mouth every evening.    [provider]  levothyroxine  (SYNTHROID ) 75 MCG tablet Take 1 tablet (75 mcg total) by mouth daily. 03/05/24   [name] LABOR, MD  Multiple Vitamins-Minerals (WOMENS 50+ MULTI VITAMIN/MIN PO) Take 1 tablet by mouth daily.    [provider]  naproxen sodium (ANAPROX) 220 MG tablet Take 220 mg by mouth daily.     [provider]  omeprazole  (PRILOSEC) 20 MG capsule Take 1 capsule (20 mg total) by mouth daily. 03/05/24   [name] LABOR, MD  POTASSIUM PO Take 595 mg by mouth at bedtime.    [provider]  predniSONE  (DELTASONE ) 5 MG tablet Take 1 tablet by mouth once daily with breakfast 12/17/23   [name] LABOR, MD  prochlorperazine  (COMPAZINE ) 10 MG tablet Take 1 tablet (10 mg total) by mouth every 6 (six) hours as needed. 02/20/23   [name] LABOR, MD  progesterone  (PROMETRIUM ) 100 MG capsule Take 1 capsule (100 mg total) by mouth daily. 07/17/23   Prentiss Annabella LABOR, NP  propranolol  (INDERAL ) 40 MG tablet Take 1 tablet (40 mg total) by mouth daily. 03/05/24   [name] LABOR, MD  Pyridoxine HCl (VITAMIN B-6  PO) Take 100 mg by mouth at bedtime.    [provider]  tacrolimus (PROTOPIC) 0.1 % ointment Apply topically daily. 09/12/23   [provider]  TOBRAMYCIN OP 80 mg. EMPTY 1 CAPSULE IN SINUS RINSE BOTTLE AND USE TWICE DAILY 06/30/18   [provider]  traMADol  (ULTRAM ) 50 MG  tablet Take 2 tablets (100 mg total) by mouth every 6 (six) hours as needed. 04/28/24   [name] LABOR, MD  UNABLE TO FIND as needed. Med Name: Hydrocortisone  Lotion USP 2.5%    [provider]    Family History Family History  Problem Relation Age of Onset   Heart disease Mother    Hypertension Mother    Diabetes Mother    Colon polyps Father    Hypertension Father    Leukemia Father    Hypertension Sister    Heart disease Sister    Bladder Cancer Sister    Alzheimer's disease Sister    Stomach cancer Paternal Uncle    Stomach cancer Paternal Grandmother    Colon cancer Neg Hx    Esophageal cancer Neg Hx    Rectal cancer Neg Hx     Social History Social History   Tobacco Use   Smoking status: Former    Current packs/day: 0.00    Types: Cigarettes    Quit date: 12/20/1998    Years since quitting: 25.4   Smokeless tobacco: Never  Vaping Use   Vaping status: Never Used  Substance Use Topics   Alcohol use: Yes    Comment: Very rare   Drug use: No     Allergies   Levaquin  [levofloxacin  in d5w], Pravastatin sodium [pravachol], Septra  [sulfamethoxazole -trimethoprim ], Statins, Azithromycin , Biaxin [clarithromycin], Celebrex [celecoxib], Ciprofloxacin , Codeine, Doxycycline , Dust mite extract, and Keflex  [cephalexin ]   Review of Systems Review of Systems  Constitutional:  Negative for chills and fever.  HENT:  Negative for ear pain and sore throat.   Eyes:  Negative for pain and visual disturbance.  Respiratory:  Negative for cough and shortness of breath.   Cardiovascular:  Negative for chest pain and palpitations.  Gastrointestinal:  Negative for abdominal pain and vomiting.  Genitourinary:  Negative for dysuria and hematuria.  Musculoskeletal:  Negative for arthralgias and back pain.  Skin:  Positive for rash. Negative for color change.  Neurological:  Negative for seizures and syncope.  All other systems reviewed and are negative.    Physical  Exam Triage Vital Signs ED Triage Vitals  Encounter Vitals Group     BP 05/24/24 1119 (!) 153/70     Girls Systolic BP Percentile --      Girls Diastolic BP Percentile --      Boys Systolic BP Percentile --      Boys Diastolic BP Percentile --      Pulse Rate 05/24/24 1119 65     Resp 05/24/24 1119 18     Temp 05/24/24 1119 98.8 F (37.1 C)     Temp src --      SpO2 05/24/24 1119 94 %     Weight --      Height --      Head Circumference --      Peak Flow --      Pain Score 05/24/24 1118 2     Pain Loc --      Pain Education --      Exclude from Growth Chart --    No data found.  Updated Vital Signs BP (!) 153/70  Pulse 65   Temp 98.8 F (37.1 C)   Resp 18   SpO2 94%   Visual Acuity Right Eye Distance:   Left Eye Distance:   Bilateral Distance:    Right Eye Near:   Left Eye Near:    Bilateral Near:     Physical Exam Vitals and nursing note reviewed.  Constitutional:      General: She is not in acute distress.    Appearance: She is well-developed.  HENT:     Head: Normocephalic and atraumatic.  Eyes:     Conjunctiva/sclera: Conjunctivae normal.  Cardiovascular:     Rate and Rhythm: Normal rate and regular rhythm.     Heart sounds: No murmur heard. Pulmonary:     Effort: Pulmonary effort is normal. No respiratory distress.     Breath sounds: Normal breath sounds.  Abdominal:     Palpations: Abdomen is soft.     Tenderness: There is no abdominal tenderness.  Musculoskeletal:        General: No swelling.     Cervical back: Neck supple.  Skin:    General: Skin is warm and dry.     Capillary Refill: Capillary refill takes less than 2 seconds.     Comments: Vesicular rash noted to right thenar eminence, right lateral forearm, right upper arm all following dermatome pattern.  Neurological:     Mental Status: She is alert.  Psychiatric:        Mood and Affect: Mood normal.      UC Treatments / Results  Labs (all labs ordered are listed, but only  abnormal results are displayed) Labs Reviewed - No data to display  EKG   Radiology No results found.  Procedures Procedures (including critical care time)  Medications Ordered in UC Medications - No data to display  Initial Impression / Assessment and Plan / UC Course  I have reviewed the triage vital signs and the nursing notes.  Pertinent labs & imaging results that were available during my care of the patient were reviewed by me and considered in my medical decision making (see chart for details).     Rash consistent with shingles.  Will treat with valacyclovir  given new vesicles that popped last night even though she has had the rash greater than 72 hours.  Kidney function normal.  Supportive care discussed.  Return precautions discussed. Final Clinical Impressions(s) / UC Diagnoses   Final diagnoses:  Herpes zoster without complication     Discharge Instructions      Take Valacyclovir  1g three times per day for 7 days. Blisters should start to crust over after.   Blisters typically scab over in 7 to 10 days and clear up within 2-4 weeks. Shingles is only contagious to people that have never had the chickenpox, the chickenpox vaccine, or anyone who has a compromised immune system.  You should avoid contact with these type of people until your blisters scab over.  I have prescribed Valacyclovir  1g three times daily for 7 days and also Gabapentin  300mg  twice daily as needed for pain   HOME CARE: Apply ice packs (wrapped in a thin towel), cool compresses, or soak in cool bath to help reduce pain. Use calamine lotion to calm itchy skin. Avoid scratching the rash. Avoid direct sunlight.     ED Prescriptions     Medication Sig Dispense Auth. Provider   valACYclovir  (VALTREX ) 1000 MG tablet Take 1 tablet (1,000 mg total) by mouth 3 (three) times daily. 21 tablet  Ward, Harlene PEDLAR, PA-C      PDMP not reviewed this encounter.   Ward, Harlene PEDLAR, PA-C 05/24/24  1214

## 2024-05-24 NOTE — Discharge Instructions (Addendum)
 Take Valacyclovir  1g three times per day for 7 days. Blisters should start to crust over after.   Blisters typically scab over in 7 to 10 days and clear up within 2-4 weeks. Shingles is only contagious to people that have never had the chickenpox, the chickenpox vaccine, or anyone who has a compromised immune system.  You should avoid contact with these type of people until your blisters scab over.  I have prescribed Valacyclovir  1g three times daily for 7 days and also Gabapentin  300mg  twice daily as needed for pain   HOME CARE: Apply ice packs (wrapped in a thin towel), cool compresses, or soak in cool bath to help reduce pain. Use calamine lotion to calm itchy skin. Avoid scratching the rash. Avoid direct sunlight.

## 2024-05-24 NOTE — ED Triage Notes (Signed)
 Pt reports 5 days ago she developed a rash on RT hand and now has a rash up the RT arm. Pt thinks she has shingles. Pt has taken benadryl  ,prednisone  . Pt took Family members Valacyclovir .

## 2024-06-25 ENCOUNTER — Telehealth: Payer: Self-pay

## 2024-06-25 DIAGNOSIS — J320 Chronic maxillary sinusitis: Secondary | ICD-10-CM | POA: Diagnosis not present

## 2024-06-25 DIAGNOSIS — M810 Age-related osteoporosis without current pathological fracture: Secondary | ICD-10-CM

## 2024-06-25 NOTE — Telephone Encounter (Signed)
 Copied from CRM [id number]. Topic: General - Other >> Jun 23, 2024 11:55 AM Revonda D wrote: Reason for CRM: Pt stated that she is due for a bone density scan in November and would like for the office to get the appt scheduled and callback with an update.

## 2024-07-01 NOTE — Telephone Encounter (Signed)
 I placed the order.

## 2024-07-01 NOTE — Addendum Note (Signed)
 Addended by: JOHNNY SENIOR A on: 07/01/2024 10:12 AM   Modules accepted: Orders

## 2024-07-04 NOTE — Telephone Encounter (Signed)
 Pt bone density order sent to front office staff for scheduling

## 2024-07-29 NOTE — Telephone Encounter (Signed)
 error

## 2024-08-21 ENCOUNTER — Other Ambulatory Visit: Payer: Self-pay | Admitting: Family Medicine

## 2024-08-25 ENCOUNTER — Ambulatory Visit (INDEPENDENT_AMBULATORY_CARE_PROVIDER_SITE_OTHER): Payer: PPO | Admitting: Nurse Practitioner

## 2024-08-25 VITALS — BP 116/78 | HR 70 | Ht 67.25 in | Wt 177.0 lb

## 2024-08-25 DIAGNOSIS — Z9189 Other specified personal risk factors, not elsewhere classified: Secondary | ICD-10-CM | POA: Diagnosis not present

## 2024-08-25 DIAGNOSIS — Z124 Encounter for screening for malignant neoplasm of cervix: Secondary | ICD-10-CM | POA: Diagnosis not present

## 2024-08-25 DIAGNOSIS — Z79890 Hormone replacement therapy (postmenopausal): Secondary | ICD-10-CM

## 2024-08-25 DIAGNOSIS — M8580 Other specified disorders of bone density and structure, unspecified site: Secondary | ICD-10-CM | POA: Diagnosis not present

## 2024-08-25 DIAGNOSIS — Z01419 Encounter for gynecological examination (general) (routine) without abnormal findings: Secondary | ICD-10-CM

## 2024-08-25 DIAGNOSIS — Z780 Asymptomatic menopausal state: Secondary | ICD-10-CM

## 2024-08-25 MED ORDER — ESTRADIOL 0.5 MG PO TABS: 0.5000 mg | ORAL_TABLET | Freq: Every day | ORAL | 3 refills | Status: AC

## 2024-08-25 MED ORDER — PROGESTERONE MICRONIZED 100 MG PO CAPS: 100.0000 mg | ORAL_CAPSULE | Freq: Every day | ORAL | 3 refills | Status: AC

## 2024-08-25 NOTE — Progress Notes (Signed)
 Victoria Mosley 11-Nov-1950 [id number]   History:  73 y.o. G1P1001 presents for breast and pelvic exam. Menopausal since mid 30s, on HRT. Has tried to wean in the past but does not tolerate. History of persistent HPV (see below). Mild osteopenia, hypothyroidism managed by PCP.  H/O recurrent UTIs. Stopped vaginal estrogen. Coronary calcium score 163, sees cardiology soon.   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 Saw Dr. Eloy at [facility] who does not feel hysterectomy is best option as she would still require vaginal paps due to possibility of HPV of vulva/vagina 2019/2020 normal cytology positive HPV 2021/2022/2023 Normal cytology neg HPV  Gynecologic History No LMP recorded. Patient is postmenopausal.   Contraception: post menopausal status Sexually active: Infrequent  Health Maintenance Last Pap: 05/25/2022. Results were: Normal neg HPV, 3-year repeat Last mammogram: 03/31/2024. Results were: Normal Last colonoscopy: 05/26/2022. Results were: Tubular adenoma, 5-year recall Last Dexa: 05/30/2022. Results were: T-score -1.2  Past medical history, past surgical history, family history and social history were all reviewed and documented in the EPIC chart. Married. Retired. Travels often. 54 yo son, lives local, has 3 daughters ages 35, 32, and 43.   ROS:  A ROS was performed and pertinent positives and negatives are included.  Exam:  Vitals:   08/25/24 0947  BP: 116/78  Pulse: 70  SpO2: 97%  Weight: 177 lb (80.3 kg)  Height: 5' 7.25 (1.708 m)      Body mass index is 27.52 kg/m.  General appearance:  Normal Thyroid :  Symmetrical, normal in size, without palpable masses or nodularity. Respiratory  Auscultation:  Clear without wheezing or rhonchi Cardiovascular  Auscultation:  Regular rate, without rubs, murmurs or  gallops  Edema/varicosities:  Not grossly evident Abdominal  Soft,nontender, without masses, guarding or rebound.  Liver/spleen:  No organomegaly noted  Hernia:  None appreciated  Skin  Inspection:  Grossly normal Breasts: Examined lying and sitting.   Right: Without masses, retractions, nipple discharge or axillary adenopathy.   Left: Without masses, retractions, nipple discharge or axillary adenopathy. Pelvic: External genitalia:  no lesions              Urethra:  normal appearing urethra with no masses, tenderness or lesions              Bartholins and Skenes: normal                 Vagina: normal appearing vagina with normal color and discharge, no lesions. Atrophic changes              Cervix: no lesions Bimanual Exam:  Uterus:  no masses or tenderness              Adnexa: no mass, fullness, tenderness              Rectovaginal: Deferred              Anus:  normal, no lesions  Victoria Mosley, CMA present as chaperone.   Assessment/Plan:  73 y.o. G1P1001 for breast and pelvic exam.   Encounter for breast and pelvic examination - Education provided on SBEs, importance of preventative screenings, current guidelines, high calcium diet, regular exercise, and multivitamin daily. Labs with PCP.   Osteopenia, unspecified location - Managed by PCP. T-score -1.2. She has had multiple orthopedic surgeries - foot, bilateral hips, right knee, laminectomy. Plans to  schedule DXA in November.   Postmenopausal hormone therapy (HRT) - Plan: estradiol  (ESTRACE ) 0.5 MG tablet daily, Prometrium  100 mg daily. Has tried to wean in the past but did not tolerate. Discussed safety with patch but declines at this time and wants to continue PO.   Screening for cervical cancer - See HPI for pap history. H/O persistent HPV, normal 2021/2022/2023. Will repeat at 3-year interval per guidelines.  Screening for breast cancer - Normal mammogram history.  Continue annual screenings.  Normal breast exam  today.  Screening for colon cancer - 04/2022 colonoscopy. Will repeat at 5-year interval per GI recommendation.   Return in about 1 year (around 08/25/2025) for B&P (high risk).     Victoria DELENA Shutter DNP, 10:32 AM 08/25/2024

## 2024-08-26 ENCOUNTER — Telehealth: Payer: Self-pay | Admitting: Pharmacy Technician

## 2024-08-26 ENCOUNTER — Other Ambulatory Visit: Payer: Self-pay | Admitting: Family Medicine

## 2024-08-26 ENCOUNTER — Encounter: Payer: Self-pay | Admitting: Internal Medicine

## 2024-08-26 ENCOUNTER — Ambulatory Visit: Attending: Internal Medicine | Admitting: Internal Medicine

## 2024-08-26 VITALS — BP 146/77 | HR 59 | Resp 16 | Ht 67.0 in | Wt 180.5 lb

## 2024-08-26 DIAGNOSIS — I25709 Atherosclerosis of coronary artery bypass graft(s), unspecified, with unspecified angina pectoris: Secondary | ICD-10-CM | POA: Diagnosis not present

## 2024-08-26 DIAGNOSIS — E785 Hyperlipidemia, unspecified: Secondary | ICD-10-CM

## 2024-08-26 DIAGNOSIS — T466X5D Adverse effect of antihyperlipidemic and antiarteriosclerotic drugs, subsequent encounter: Secondary | ICD-10-CM | POA: Diagnosis not present

## 2024-08-26 DIAGNOSIS — T466X5A Adverse effect of antihyperlipidemic and antiarteriosclerotic drugs, initial encounter: Secondary | ICD-10-CM

## 2024-08-26 DIAGNOSIS — M791 Myalgia, unspecified site: Secondary | ICD-10-CM | POA: Diagnosis not present

## 2024-08-26 DIAGNOSIS — I700 Atherosclerosis of aorta: Secondary | ICD-10-CM

## 2024-08-26 MED ORDER — NEXLIZET 180-10 MG PO TABS: 1.0000 | ORAL_TABLET | Freq: Every day | ORAL | 3 refills | Status: AC

## 2024-08-26 NOTE — Telephone Encounter (Signed)
 Pharmacy Patient Advocate Encounter  Received notification from [employer] that Prior Authorization for nexlizet has been APPROVED from 08/26/24 to 02/22/25   PA #/Case ID/Reference #: [id number]

## 2024-08-26 NOTE — Progress Notes (Signed)
 OFFICE NOTE  Chief Complaint:  Establish cardiologist  Primary Care Physician: [name] LABOR, MD  HPI:  Victoria Mosley is a 73 y.o. female with a past medial history significant for multiple medical problems as outlined below and statin intolerance, as she believe it lead to bilateral hip replacement. She had a remote stress test in 2012, which was negative. There is a family history of CAD which is primarily on her mother side and is very strong although her mother had some coronary artery disease in her late 41s.  I believe she said her uncle had a bypass and she had sisters with coronary artery disease.  She recently had a coronary calcium score which was elevated at 163, 77 percentile for age and sex matched controls.  There was aortic atherosclerosis as well.  She has been only tolerating ezetimibe  for lipid lowering.  Lipids in May showed total cholesterol 213, triglycerides 314, HDL 41 and LDL 108.  She says that she has had high cholesterol even since her early 6s when she weighed less than 100 pounds.  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 Eloy 04/30/2017, 2019 negative subtypes 16, 18/45   Hyperlipidemia    IBS (irritable bowel syndrome)    Kidney stones    bilateral   melanoma dx'd 2011   rt arm--surg only   Menopause    sees Dr. Rockney   Migraines    Nephrolithiasis    left   Neurogenic bladder    Normal cardiac stress test 2012   PONV (postoperative nausea and vomiting)    Sepsis (HCC) 01/2016   related to infection caused by kidney stone blockage    Shingles    Tendonitis    right ankle   Thyroid  disease    Urinary urgency    UTI (urinary tract infection)    started atb on 03-24-12    Past Surgical History:  Procedure Laterality Date   BACK SURGERY  10/1991   L5 ruptured disc- Dr.Robinson   CARPAL TUNNEL RELEASE Right 06/2018   per Dr. Camella CARRY TUNNEL RELEASE Left 08/2018   per Dr. Camella   CESAREAN SECTION  12/1980   COLONOSCOPY  05/26/2022   per Dr. Abran, adenomatous polyps,  repeat in 5 yrs   CYST REMOVAL HAND Right 09/2021   middle finger per Dr. Camella   CYST REMOVAL HAND  Left 12/2021   ring finger per Dr. Camella   CYSTOSCOPY W/ URETERAL STENT PLACEMENT Right 02/02/2017   Procedure: CYSTOSCOPY WITH RETROGRADE PYELOGRAM/URETERAL STENT PLACEMENT;  Surgeon: Norleen Seltzer, MD;  Location: [facility];  Service: Urology;  Laterality: Right;   CYSTOSCOPY WITH RETROGRADE PYELOGRAM, URETEROSCOPY AND STENT PLACEMENT Left 12/25/2012   Procedure: CYSTOSCOPY WITH RETROGRADE PYELOGRAM, URETEROSCOPY, EXTRACTION OF  LEFT STONE WITH BASKETAND STENT PLACEMENT;  Surgeon: Toribio Neysa Repine, MD;  Location: [facility];  Service: Urology;  Laterality: Left;  2.5 HRS    CYSTOSCOPY/URETEROSCOPY/HOLMIUM LASER/STENT PLACEMENT Right 02/15/2017   Procedure: CYSTOSCOPY/URETEROSCOPY/HOLMIUM LASER/STENT EXCHANGE;  Surgeon: Morene LELON Salines, MD;  Location: [facility];  Service: Urology;  Laterality: Right;   FOOT SURGERY Bilateral 1987   per Dr. Van   HEMATOMA EVACUATION   07/08/2012   Procedure: EVACUATION HEMATOMA;  Surgeon: Victory Gens, MD;  Location: [facility];  Service: Neurosurgery;  Laterality: N/A;  Cervical Seven-thoracic One Laminectomy and Decompression of Epidural Hematoma   HEMORRHOID BANDING     June and August 2016, Dr Rubin   JOINT REPLACEMENT     KIDNEY STONE SURGERY  12/2003   per Dr. Amiel   LEEP  07/2015   final pathology LGSIL with clear margins negative ECC   LITHOTRIPSY  2007   x 3/Dr. Amiel   LUMBAR LAMINECTOMY  1993   1993, at L5 per Dr. Lang   MELANOMA EXCISION  2007   left upper arm per dr. Scot   NASAL SEPTUM SURGERY  1992   per Dr. Ann   NASAL/SINUS ENDOSCOPY  03/2016   per Dr. Norleen Notice    POSTERIOR CERVICAL LAMINECTOMY  07/08/2012   Procedure: POSTERIOR CERVICAL LAMINECTOMY;  Surgeon: Victory Gens, MD;  Location: [facility];  Service: Neurosurgery;  Laterality: N/A;  Cervical Seven- thoracic One Laminectomy and Decompression of Epidural Hematoma   SPINE SURGERY     TOTAL HIP ARTHROPLASTY Right 11/2008   Aluscio   TOTAL HIP ARTHROPLASTY Left 09/27/2016   Procedure: LEFT TOTAL HIP ARTHROPLASTY ANTERIOR APPROACH;  Surgeon: Dempsey Moan, MD;  Location: [facility];  Service: Orthopedics;  Laterality: Left;   TOTAL KNEE ARTHROPLASTY Right 12/07/2014   Procedure: RIGHT TOTAL KNEE ARTHROPLASTY;  Surgeon: Dempsey Moan GAILS, MD;  Location: [facility];  Service: Orthopedics;  Laterality: Right;   TRIGGER FINGER RELEASE Left 03/2019   Thumb and middle finger per Dr. Camella   TUBAL LIGATION  01/1988    FAMHx:  Family History  Problem Relation Age of Onset   Heart disease Mother    Hypertension Mother    Diabetes Mother    Anxiety disorder Mother    Arthritis Mother    Colon polyps Father    Hypertension Father    Leukemia Father    ADD / ADHD Father    Cancer Father    Hearing loss Father    Hypertension Sister    Heart disease Sister    Bladder Cancer Sister    Arthritis Sister    Intellectual  disability Sister    Alzheimer's disease Sister    Stomach cancer Paternal Uncle    Stomach cancer Paternal Grandmother    ADD / ADHD Son    Cancer Sister    Hearing loss Sister    Heart disease Sister    Hyperlipidemia Sister    Colon cancer Neg Hx    Esophageal cancer Neg Hx    Rectal cancer Neg Hx     SOCHx:   reports  that she quit smoking about 25 years ago. Her smoking use included cigarettes. She has never used smokeless tobacco. She reports that she does not currently use alcohol. She reports that she does not use drugs.  ALLERGIES:  Allergies  Allergen Reactions   Levaquin  [Levofloxacin  In D5w] Anaphylaxis, Rash and Other (See Comments)    Hot, dizziness, diarrhea. Tolerated Ciprofloxacin  12/2017   Pravastatin Sodium [Pravachol]     Joint pain(severe)   Septra  [Sulfamethoxazole -Trimethoprim ] Anaphylaxis    diarrhea   Statins     Joint Pain   Azithromycin  Nausea And Vomiting   Biaxin [Clarithromycin] Nausea And Vomiting   Celebrex [Celecoxib]     Severe GI upset   Ciprofloxacin  Nausea Only    Severe GI upset   Codeine Nausea And Vomiting   Doxycycline      Stomach Upset    Dust Mite Extract Other (See Comments)      Grass, trees also- cause Runny nose, sneezing (allergy symptoms)   Keflex  [Cephalexin ] Itching    Stomach upset    ROS: Pertinent items noted in HPI and remainder of comprehensive ROS otherwise negative.  HOME MEDS: Current Outpatient Medications on File Prior to Visit  Medication Sig Dispense Refill   Acetaminophen  (TYLENOL  PO) Take by mouth daily as needed.     amoxicillin  (AMOXIL ) 500 MG capsule Take 4 capsules (2,000 mg total) by mouth as needed (dental procedures). 12 capsule 3   Ascorbic Acid  (VITAMIN C ) 1000 MG tablet Take 1,000 mg by mouth daily.     betamethasone  valerate lotion (VALISONE ) 0.1 % Apply topically as needed.     CALCIUM CITRATE PO Take 1,000 mg by mouth at bedtime.     Cranberry 250 MG CAPS Take 500 mg by mouth 2 (two)  times daily.     Cyanocobalamin  (VITAMIN B 12 PO) Take 500 mcg by mouth daily.     DOCUSATE SODIUM  PO Take 100 mg by mouth as needed. Takes Monday, Wednesday, Friday, Sunday     estradiol  (ESTRACE ) 0.5 MG tablet Take 1 tablet (0.5 mg total) by mouth daily. 100 tablet 3   LACTOBACILLUS PO Take 5 mg by mouth daily.     levocetirizine (XYZAL) 5 MG tablet Take 5 mg by mouth as needed for allergies.     levothyroxine  (SYNTHROID ) 75 MCG tablet Take 1 tablet (75 mcg total) by mouth daily. 100 tablet 3   Multiple Vitamins-Minerals (WOMENS 50+ MULTI VITAMIN/MIN PO) Take 1 tablet by mouth daily.     naproxen sodium (ANAPROX) 220 MG tablet Take 220 mg by mouth daily.      omeprazole  (PRILOSEC) 20 MG capsule Take 1 capsule (20 mg total) by mouth daily. 100 capsule 3   POTASSIUM PO Take 595 mg by mouth at bedtime.     predniSONE  (DELTASONE ) 5 MG tablet Take 1 tablet by mouth once daily with breakfast 90 tablet 3   prochlorperazine  (COMPAZINE ) 10 MG tablet Take 1 tablet (10 mg total) by mouth every 6 (six) hours as needed. 100 tablet 3   progesterone  (PROMETRIUM ) 100 MG capsule Take 1 capsule (100 mg total) by mouth daily. 100 capsule 3   propranolol  (INDERAL ) 40 MG tablet Take 1 tablet (40 mg total) by mouth daily. 100 tablet 3   Pyridoxine HCl (VITAMIN B6 PO) Take by mouth.     TOBRAMYCIN OP 80 mg. EMPTY 1 CAPSULE IN SINUS RINSE BOTTLE AND USE TWICE DAILY     traMADol  (ULTRAM ) 50 MG tablet TAKE 2 TABLETS BY MOUTH EVERY 6  HOURS AS NEEDED 100 tablet 0   UNABLE TO FIND as needed. Med Name: Hydrocortisone  Lotion USP 2.5%     No current facility-administered medications on file prior to visit.    LABS/IMAGING: No results found for this or any previous visit (from the past 48 hours). No results found.  LIPID PANEL:    Component Value Date/Time   CHOL 213 (H) 03/05/2024 0940   TRIG 314.0 (H) 03/05/2024 0940   TRIG 91 10/05/2006 1002   HDL 41.90 03/05/2024 0940   CHOLHDL 5 03/05/2024 0940   VLDL 62.8  (H) 03/05/2024 0940   LDLCALC 108 (H) 03/05/2024 0940   LDLDIRECT 127.0 02/20/2023 0918     WEIGHTS: Wt Readings from Last 3 Encounters:  08/26/24 180 lb 8 oz (81.9 kg)  08/25/24 177 lb (80.3 kg)  03/05/24 184 lb (83.5 kg)    VITALS: BP (!) 146/77 (BP Location: Right Arm, Patient Position: Sitting, Cuff Size: Normal)   Pulse (!) 59   Resp 16   Ht 5' 7 (1.702 m)   Wt 180 lb 8 oz (81.9 kg)   SpO2 94%   BMI 28.27 kg/m   EXAM: General appearance: alert and no distress Lungs: clear to auscultation bilaterally Heart: regular rate and rhythm, S1, S2 normal, no murmur, click, rub or gallop Extremities: extremities normal, atraumatic, no cyanosis or edema Neurologic: Grossly normal  EKG: EKG Interpretation Date/Time:  Tuesday August 26 2024 11:08:33 EDT Ventricular Rate:  62 PR Interval:  170 QRS Duration:  90 QT Interval:  426 QTC Calculation: 432 R Axis:   -33  Text Interpretation: Normal sinus rhythm Left axis deviation Low voltage QRS Cannot rule out Anterior infarct , age undetermined No significant change since last tracing Confirmed by Mona Kent [phone]) on 08/26/2024 11:28:00 AM - personally reviewed  ASSESSMENT: CAC score of 163, 77th percentile (04/2024) Aortic atherosclerosis Statin intolerant-myalgias Dyslipidemia, goal LDL less than 70 Strong family history of heart disease on her mothers side  PLAN: 1.   Ms. Tondreau has a history of heart disease in her family and a dyslipidemia but has been intolerant to statins.  She is currently on ezetimibe  but was found recently to have age advanced coronary artery calcification and aortic atherosclerosis.  She needs additional lipid-lowering.  I think a good option for her would be adding Nexletol to her current ezetimibe  as Nexlizet.  This should simplify pill burden and help her further achieve her target LDL.  Will reach out for prior authorization for this.  Plan repeat lipids including NMR and LP(a) in about 3  months.  Follow-up otherwise with me annually or sooner as necessary  Kent KYM Mona, MD, [facility], FNLA, FACP  [place]  [facility]  Medical Director of the Advanced Lipid Disorders &  Cardiovascular Risk Reduction Clinic Diplomate of the American Board of Clinical Lipidology Attending Cardiologist  Direct Dial: [phone]  Fax: [phone]  Website:  [email or web address]   Kent BROCKS Ameisha Mcclellan 08/26/2024, 12:12 PM

## 2024-08-26 NOTE — Telephone Encounter (Signed)
   Pharmacy Patient Advocate Encounter   Received notification from CoverMyMeds that prior authorization for nexlizet is required/requested.   Insurance verification completed.   The patient is insured through [facility] ADVANTAGE/RX ADVANCE.   Per test claim: PA required; PA submitted to above mentioned insurance via Latent Key/confirmation #/[id number] Status is pending

## 2024-08-26 NOTE — Patient Instructions (Signed)
 Medication Instructions:  START Nexlizet 180/10mg  once daily  *If you need a refill on your cardiac medications before your next appointment, please call your pharmacy*  Lab Work: FASTING lab work in 3 months -- NMR lipoprofile and LPa  If you have labs (blood work) drawn today and your tests are completely normal, you will receive your results only by: MyChart Message (if you have MyChart) OR A paper copy in the mail If you have any lab test that is abnormal or we need to change your treatment, we will call you to review the results.  Follow-Up: At [facility], you and your health needs are our priority.  As part of our continuing mission to provide you with exceptional heart care, our providers are all part of one team.  This team includes your primary Cardiologist (physician) and Advanced Practice Providers or APPs (Physician Assistants and Nurse Practitioners) who all work together to provide you with the care you need, when you need it.  Your next appointment:    12 months with Dr. Mona   We recommend signing up for the patient portal called MyChart.  Sign up information is provided on this After Visit Summary.  MyChart is used to connect with patients for Virtual Visits (Telemedicine).  Patients are able to view lab/test results, encounter notes, upcoming appointments, etc.  Non-urgent messages can be sent to your provider as well.   To learn more about what you can do with MyChart, go to [email or web address].   Other Instructions

## 2024-08-27 ENCOUNTER — Other Ambulatory Visit (HOSPITAL_COMMUNITY): Payer: Self-pay

## 2024-08-27 ENCOUNTER — Telehealth: Payer: Self-pay | Admitting: Pharmacy Technician

## 2024-08-27 NOTE — Telephone Encounter (Signed)
   Patient Advocate Encounter   The patient was approved for a [employer] grant that will help cover the cost of nexlizet Total amount awarded, 2500.  Effective: 07/28/24 - 07/27/25   APW:[id number] ERW:[id number] Hmnle:[id number] PI:[id number] [employer] ID: [id number]   Pharmacy provided with approval and processing information. Patient informed via mychart/telephone

## 2024-09-01 ENCOUNTER — Other Ambulatory Visit: Payer: Self-pay | Admitting: Family Medicine

## 2024-09-01 DIAGNOSIS — M79644 Pain in right finger(s): Secondary | ICD-10-CM | POA: Diagnosis not present

## 2024-09-01 DIAGNOSIS — Z4789 Encounter for other orthopedic aftercare: Secondary | ICD-10-CM | POA: Diagnosis not present

## 2024-09-01 DIAGNOSIS — M65341 Trigger finger, right ring finger: Secondary | ICD-10-CM | POA: Diagnosis not present

## 2024-09-02 ENCOUNTER — Ambulatory Visit (INDEPENDENT_AMBULATORY_CARE_PROVIDER_SITE_OTHER)
Admission: RE | Admit: 2024-09-02 | Discharge: 2024-09-02 | Disposition: A | Discharge status: Home / Self Care | Source: Ambulatory Visit | Attending: Family Medicine | Admitting: Family Medicine

## 2024-09-02 DIAGNOSIS — M810 Age-related osteoporosis without current pathological fracture: Secondary | ICD-10-CM

## 2024-09-03 ENCOUNTER — Ambulatory Visit: Payer: Self-pay | Admitting: Family Medicine

## 2024-09-09 ENCOUNTER — Ambulatory Visit: Admitting: Family Medicine

## 2024-09-09 ENCOUNTER — Encounter: Payer: Self-pay | Admitting: Family Medicine

## 2024-09-09 VITALS — BP 135/70 | Wt 177.0 lb

## 2024-09-09 DIAGNOSIS — Z0000 Encounter for general adult medical examination without abnormal findings: Secondary | ICD-10-CM

## 2024-09-09 NOTE — Patient Instructions (Signed)
 I really enjoyed getting to talk with you today! I am available on Tuesdays and Thursdays for virtual visits if you have any questions or concerns, or if I can be of any further assistance.   CHECKLIST FROM ANNUAL WELLNESS VISIT:  -Follow up (please call to schedule if not scheduled after visit):   -yearly for annual wellness visit with primary care office  Here is a list of your preventive care/health maintenance measures and the plan for each if any are due:  PLAN For any measures below that may be due:   Health Maintenance  Topic Date Due   COVID-19 Vaccine (12 - Pfizer risk 2025-26 season) 01/07/2025   Mammogram  03/31/2025   Medicare Annual Wellness (AWV)  09/09/2025   Colonoscopy  05/27/2027   DTaP/Tdap/Td (4 - Td or Tdap) 01/26/2032   Pneumococcal Vaccine: 50+ Years  Completed   Influenza Vaccine  Completed   DEXA SCAN  Completed   Hepatitis C Screening  Completed   Zoster Vaccines- Shingrix  Completed   Meningococcal B Vaccine  Aged Out    -See a dentist at least yearly  -Get your eyes checked and then per your eye specialist's recommendations  -Other issues addressed today:   -I have included below further information regarding a healthy whole foods based diet, physical activity guidelines for adults, stress management and opportunities for social connections. I hope you find this information useful.   -----------------------------------------------------------------------------------------------------------------------------------------------------------------------------------------------------------------------------------------------------------    NUTRITION: -eat real food: lots of colorful vegetables (half the plate) and fruits -5-7 servings of vegetables and fruits per day (fresh or steamed is best), exp. 2 servings of vegetables with lunch and dinner and 2 servings of fruit per day. Berries and greens such as kale and collards are great choices.  -consume  on a regular basis:  fresh fruits, fresh veggies, fish, nuts, seeds, healthy oils (such as olive oil, avocado oil), whole grains (make sure for bread/pasta/crackers/etc., that the first ingredient on label contains the word whole), legumes. -can eat small amounts of dairy and lean meat (no larger than the palm of your hand), but avoid processed meats such as ham, bacon, lunch meat, etc. -drink water  -try to avoid fast food and pre-packaged foods, processed meat, ultra processed foods/beverages (donuts, candy, etc.) -most experts advise limiting sodium to < 2300mg  per day, should limit further is any chronic conditions such as high blood pressure, heart disease, diabetes, etc. The American Heart Association advised that < 1500mg  is is ideal -try to avoid foods/beverages that contain any ingredients with names you do not recognize  -try to avoid foods/beverages  with added sugar or sweeteners/sweets  -try to avoid sweet drinks (including diet drinks): soda, juice, Gatorade, sweet tea, power drinks, diet drinks -try to avoid white rice, white bread, pasta (unless whole grain)  EXERCISE GUIDELINES FOR ADULTS: -if you wish to increase your physical activity, do so gradually and with the approval of your doctor -STOP and seek medical care immediately if you have any chest pain, chest discomfort or trouble breathing when starting or increasing exercise  -move and stretch your body, legs, feet and arms when sitting for long periods -Physical activity guidelines for optimal health in adults: -get at least 150 minutes per week of moderate exercise (can talk, but not sing); this is about 20-30 minutes of sustained activity 5-7 days per week or two 10-15 minute episodes of sustained activity 5-7 days per week -do some muscle building/resistance training/strength training at least 2 days per week  -balance  exercises 3+ days per week:   Stand somewhere where you have something sturdy to hold onto if you lose  balance    1) lift up on toes, then back down, start with 5x per day and work up to 20x   2) stand and lift one leg straight out to the side so that foot is a few inches of the floor, start with 5x each side and work up to 20x each side   3) stand on one foot, start with 5 seconds each side and work up to 20 seconds on each side  If you need ideas or help with getting more active:  -Silver sneakers https://tools.silversneakers.com  -Walk with a Doc: Http://www.duncan-williams.com/  -try to include resistance (weight lifting/strength building) and balance exercises twice per week: or the following link for ideas: http://castillo-powell.com/  buyducts.dk  STRESS MANAGEMENT: -can try meditating, or just sitting quietly with deep breathing while intentionally relaxing all parts of your body for 5 minutes daily -if you need further help with stress, anxiety or depression please follow up with your primary doctor or contact the wonderful folks at [facility]: [phone]  SOCIAL CONNECTIONS: -options in [place] if you wish to engage in more social and exercise related activities:  -Silver sneakers https://tools.silversneakers.com  -Walk with a Doc: Http://www.duncan-williams.com/  -Check out the [facility] Active Adults 50+ section on the [place] of [employer] companies (hiking clubs, book clubs, cards and games, chess, exercise classes, aquatic classes and much more) - see the website for details: https://www.[place].gov/departments/parks-recreation/active-adults50  -YouTube has lots of exercise videos for different ages and abilities as well  -[facility] (a variety of indoor and outdoor inperson activities for adults). 304-063-0306. [address].  -Virtual Online Classes (a variety of topics): see seniorplanet.org or call 6141821299  -consider volunteering at a  school, hospice center, church, senior center or elsewhere

## 2024-09-09 NOTE — Progress Notes (Signed)
 ----------------------------------------------------------------------------------------------------------------------------------------------------------------------------------------------------------------------  Because this visit was a virtual/telehealth visit, some criteria may be missing or patient reported. Any vitals not documented were not able to be obtained and vitals that have been documented are patient reported.    MEDICARE ANNUAL PREVENTIVE VISIT WITH PROVIDER: (Welcome to Medicare, initial annual wellness or annual wellness exam)  Virtual Visit via Video Note  I connected with Victoria Mosley on 09/09/24 by a video enabled telemedicine application and verified that I am speaking with the correct person using two identifiers.  Location patient: home Location provider:work or home office Persons participating in the virtual visit: patient, provider  Concerns and/or follow up today: detailed intake and risks/health assessment completed in flowsheets and below - please see for details.   How often do you have a drink containing alcohol?does not drink - very rarely will have 1 drink  How many drinks containing alcohol do you have on a typical day when you are drinking?na How often do you have six or more drinks on one occasion?never Have you ever smoked?y Quit date if applicable? 2000  How many packs a day do/did you smoke? Na - very light Do you use smokeless tobacco?na Do you use an illicit drugs?n Do you feel safe at home?y Last dentist visit?goes every six months Last eye Exam and location?goes yearly   See HM section in Epic for other details of completed HM.    ROS: negative for report of fevers, unintentional weight loss, vision changes, vision loss, hearing loss or change, chest pain, sob, hemoptysis, melena, hematochezia, hematuria, falls, bleeding or bruising, thoughts of suicide or self harm, memory loss  Patient-completed extensive health risk assessment  - reviewed and discussed with the patient: See Health Risk Assessment completed with patient prior to the visit either above or in recent phone note. This was reviewed in detailed with the patient today and appropriate recommendations, orders and referrals were placed as needed per Summary below and patient instructions.   Review of Medical History: -PMH, PSH, Family History and current specialty and care providers reviewed and updated and listed below   Patient Care Team: [name] LABOR, MD as PCP - General Cam Morene ORN, MD as Attending Physician (Urology) Fontaine, Evalene SQUIBB, MD (Inactive) as Consulting Physician (Gynecology) Abran Norleen SAILOR, MD as Consulting Physician (Gastroenterology) Camella Fallow, MD as Consulting Physician (Orthopedic Surgery) Melodi Lerner, MD as Consulting Physician (Orthopedic Surgery) Elnor Zebedee KATHEE DEVONNA (Dermatology) Liane Sharyne MATSU, [facility] (Inactive) as Pharmacist (Pharmacist) Dr. 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 Eloy 04/30/2017, 2019 negative subtypes 16, 18/45   Hyperlipidemia    IBS (irritable bowel syndrome)    Kidney stones     bilateral   melanoma dx'd 2011   rt arm--surg only   Menopause    sees Dr. Rockney   Migraines    Nephrolithiasis    left   Neurogenic bladder    Normal cardiac stress test 2012   PONV (postoperative nausea and vomiting)    Sepsis (HCC) 01/2016   related to infection caused by kidney stone blockage    Shingles    Tendonitis    right ankle   Thyroid  disease    Urinary urgency    UTI (urinary tract infection)    started atb on 03-24-12    Past Surgical History:  Procedure Laterality Date   BACK SURGERY  10/1991   L5 ruptured disc- Dr.Robinson   CARPAL TUNNEL RELEASE Right 06/2018   per Dr. Camella CARRY TUNNEL RELEASE Left 08/2018   per Dr. Camella   CESAREAN SECTION  12/1980   COLONOSCOPY  05/26/2022   per Dr. Abran, adenomatous polyps,  repeat in 5 yrs   CYST REMOVAL HAND Right 09/2021   middle finger per Dr. Camella   CYST REMOVAL HAND Left 12/2021   ring finger per Dr. Camella   CYSTOSCOPY W/ URETERAL STENT PLACEMENT Right 02/02/2017   Procedure: CYSTOSCOPY WITH RETROGRADE PYELOGRAM/URETERAL STENT PLACEMENT;  Surgeon: Norleen Seltzer, MD;  Location: [facility];  Service: Urology;  Laterality: Right;   CYSTOSCOPY WITH RETROGRADE PYELOGRAM, URETEROSCOPY AND STENT PLACEMENT Left 12/25/2012   Procedure: CYSTOSCOPY WITH RETROGRADE PYELOGRAM, URETEROSCOPY, EXTRACTION OF  LEFT STONE WITH BASKETAND STENT PLACEMENT;  Surgeon: Toribio Neysa Repine, MD;  Location[facility];  Service: Urology;  Laterality: Left;  2.5 HRS    CYSTOSCOPY/URETEROSCOPY/HOLMIUM LASER/STENT PLACEMENT Right 02/15/2017   Procedure: CYSTOSCOPY/URETEROSCOPY/HOLMIUM LASER/STENT EXCHANGE;  Surgeon: Morene LELON Salines, MD;  Location: [facility];  Service: Urology;  Laterality: Right;   FOOT SURGERY Bilateral 1987   per Dr. Van   HEMATOMA EVACUATION  07/08/2012   Procedure: EVACUATION HEMATOMA;  Surgeon: Victory Gens, MD;  Location: [facility];  Service: Neurosurgery;  Laterality: N/A;  Cervical  Seven-thoracic One Laminectomy and Decompression of Epidural Hematoma   HEMORRHOID BANDING     June and August 2016, Dr Rubin   JOINT REPLACEMENT     KIDNEY STONE SURGERY  12/2003   per Dr. Amiel   LEEP  07/2015   final pathology LGSIL with clear margins negative ECC   LITHOTRIPSY  2007   x 3/Dr. Amiel   LUMBAR LAMINECTOMY  1993   1993, at L5 per Dr. Lang   MELANOMA EXCISION  2007   left upper arm per dr. Scot   NASAL SEPTUM SURGERY  1992   per Dr. Ann   NASAL/SINUS ENDOSCOPY  03/2016   per Dr. Norleen Notice    POSTERIOR CERVICAL LAMINECTOMY  07/08/2012   Procedure: POSTERIOR CERVICAL LAMINECTOMY;  Surgeon: Victory Gens, MD;  Location: [facility];  Service: Neurosurgery;  Laterality: N/A;  Cervical Seven- thoracic One Laminectomy and Decompression of Epidural Hematoma   SPINE SURGERY     TOTAL HIP ARTHROPLASTY Right 11/2008   Aluscio   TOTAL HIP ARTHROPLASTY Left 09/27/2016   Procedure: LEFT TOTAL HIP ARTHROPLASTY ANTERIOR APPROACH;  Surgeon: Dempsey Moan, MD;  Location: [facility];  Service: Orthopedics;  Laterality: Left;   TOTAL  KNEE ARTHROPLASTY Right 12/07/2014   Procedure: RIGHT TOTAL KNEE ARTHROPLASTY;  Surgeon: Dempsey Melodi GAILS, MD;  Location: [facility];  Service: Orthopedics;  Laterality: Right;   TRIGGER FINGER RELEASE Left 03/2019   Thumb and middle finger per Dr. Camella   TUBAL LIGATION  01/1988    Social History   Socioeconomic History   Marital status: Married    Spouse name: Elspeth   Number of children: 1   Years of education: Not on file   Highest education level: 12th grade  Occupational History   Occupation: Film/video editor  Tobacco Use   Smoking status: Former    Current packs/day: 0.00    Types: Cigarettes    Quit date: 12/20/1998    Years since quitting: 25.7   Smokeless tobacco: Never  Vaping Use   Vaping status: Never Used  Substance and Sexual Activity   Alcohol use: Not Currently   Drug use: No   Sexual activity: Yes     Partners: Male    Birth control/protection: Post-menopausal    Comment: 1st intercourse 30 yo-2 partners  Other Topics Concern   Not on file  Social History Narrative   Not on file   Social Drivers of Health   Financial Resource Strain: Low Risk  (09/08/2024)   Overall Financial Resource Strain (CARDIA)    Difficulty of Paying Living Expenses: Not hard at all  Food Insecurity: No Food Insecurity (09/09/2024)   Hunger Vital Sign    Worried About Running Out of Food in the Last Year: Never true    Ran Out of Food in the Last Year: Never true  Transportation Needs: No Transportation Needs (09/09/2024)   PRAPARE - Administrator, Civil Service (Medical): No    Lack of Transportation (Non-Medical): No  Physical Activity: Inactive (09/09/2024)   Exercise Vital Sign    Days of Exercise per Week: 0 days    Minutes of Exercise per Session: 0 min  Stress: Stress Concern Present (09/09/2024)   [employer] of Occupational Health - Occupational Stress Questionnaire    Feeling of Stress: To some extent  Social Connections: Socially Integrated (09/09/2024)   Social Connection and Isolation Panel    Frequency of Communication with Friends and Family: Three times a week    Frequency of Social Gatherings with Friends and Family: Three times a week    Attends Religious Services: 1 to 4 times per year    Active Member of Clubs or Organizations: No    Attends Banker Meetings: More than 4 times per year    Marital Status: Married  Catering Manager Violence: Not At Risk (08/31/2022)   Humiliation, Afraid, Rape, and Kick questionnaire    Fear of Current or Ex-Partner: No    Emotionally Abused: No    Physically Abused: No    Sexually Abused: No    Family History  Problem Relation Age of Onset   Heart disease Mother    Hypertension Mother    Diabetes Mother    Anxiety disorder Mother    Arthritis Mother    Colon polyps Father    Hypertension Father    Leukemia  Father    ADD / ADHD Father    Cancer Father    Hearing loss Father    Hypertension Sister    Heart disease Sister    Bladder Cancer Sister    Arthritis Sister    Intellectual disability Sister    Alzheimer's disease Sister    Stomach cancer  Paternal Uncle    Stomach cancer Paternal Grandmother    ADD / ADHD Son    Cancer Sister    Hearing loss Sister    Heart disease Sister    Hyperlipidemia Sister    Colon cancer Neg Hx    Esophageal cancer Neg Hx    Rectal cancer Neg Hx     Current Outpatient Medications on File Prior to Visit  Medication Sig Dispense Refill   Acetaminophen  (TYLENOL  PO) Take by mouth daily as needed.     Ascorbic Acid  (VITAMIN C ) 1000 MG tablet Take 1,000 mg by mouth daily.     Bempedoic Acid-Ezetimibe  (NEXLIZET) 180-10 MG TABS Take 1 tablet by mouth daily. 90 tablet 3   betamethasone  valerate lotion (VALISONE ) 0.1 % Apply topically as needed.     Butalbital -APAP-Caffeine  50-300-40 MG CAPS TAKE 1 CAPSULE BY MOUTH TWICE DAILY AS NEEDED FOR HEADACHE 60 capsule 2   CALCIUM CITRATE PO Take 1,000 mg by mouth at bedtime.     Cranberry 250 MG CAPS Take 500 mg by mouth 2 (two) times daily.     Cyanocobalamin  (VITAMIN B-12) 1000 MCG SUBL      Docusate Sodium  100 MG/10ML LIQD      estradiol  (ESTRACE ) 0.5 MG tablet Take 1 tablet (0.5 mg total) by mouth daily. 100 tablet 3   LACTOBACILLUS PO Take 5 mg by mouth daily.     levocetirizine (XYZAL) 5 MG tablet Take 5 mg by mouth as needed for allergies.     levothyroxine  (SYNTHROID ) 75 MCG tablet Take 1 tablet (75 mcg total) by mouth daily. 100 tablet 3   Multiple Vitamins-Minerals (WOMENS 50+ MULTI VITAMIN/MIN PO) Take 1 tablet by mouth daily.     naproxen sodium (ANAPROX) 220 MG tablet Take 220 mg by mouth daily.      omeprazole  (PRILOSEC) 20 MG capsule Take 1 capsule (20 mg total) by mouth daily. 100 capsule 3   POTASSIUM GLUCONATE PO      predniSONE  (DELTASONE ) 5 MG tablet Take 1 tablet by mouth once daily with  breakfast 90 tablet 3   prochlorperazine  (COMPAZINE ) 10 MG tablet Take 1 tablet (10 mg total) by mouth every 6 (six) hours as needed. 100 tablet 3   progesterone  (PROMETRIUM ) 100 MG capsule Take 1 capsule (100 mg total) by mouth daily. 100 capsule 3   propranolol  (INDERAL ) 40 MG tablet Take 1 tablet (40 mg total) by mouth daily. 100 tablet 3   Pyridoxine HCl (VITAMIN B6 PO) Take by mouth.     TOBRAMYCIN OP 80 mg. EMPTY 1 CAPSULE IN SINUS RINSE BOTTLE AND USE TWICE DAILY     traMADol  (ULTRAM ) 50 MG tablet TAKE 2 TABLETS BY MOUTH EVERY 6 HOURS AS NEEDED 100 tablet 0   UNABLE TO FIND as needed. Med Name: Hydrocortisone  Lotion USP 2.5%     amoxicillin  (AMOXIL ) 500 MG capsule TAKE 4 CAPSULES BY MOUTH AS NEEDED (DENTAL PROCEDURES) (Patient not taking: Reported on 09/09/2024) 12 capsule 2   DOCUSATE SODIUM  PO Take 100 mg by mouth as needed. Takes Monday, Wednesday, Friday, Sunday     No current facility-administered medications on file prior to visit.    Allergies  Allergen Reactions   Levaquin  [Levofloxacin  In D5w] Anaphylaxis, Rash and Other (See Comments)    Hot, dizziness, diarrhea. Tolerated Ciprofloxacin  12/2017   Pravastatin Sodium [Pravachol]     Joint pain(severe)   Septra  [Sulfamethoxazole -Trimethoprim ] Anaphylaxis    diarrhea   Statins     Joint Pain  Azithromycin  Nausea And Vomiting   Biaxin [Clarithromycin] Nausea And Vomiting   Celebrex [Celecoxib]     Severe GI upset   Ciprofloxacin  Nausea Only    Severe GI upset   Codeine Nausea And Vomiting   Doxycycline      Stomach Upset    Dust Mite Extract Other (See Comments)      Grass, trees also- cause Runny nose, sneezing (allergy symptoms)   Keflex  [Cephalexin ] Itching    Stomach upset       Physical Exam Vitals requested from patient and listed below if patient had equipment and was able to obtain at home for this virtual visit: Vitals:   09/09/24 1015  BP: 135/70   Estimated body mass index is 27.72 kg/m as  calculated from the following:   Height as of 08/26/24: 5' 7 (1.702 m).   Weight as of this encounter: 177 lb (80.3 kg).  EKG (optional): deferred due to virtual visit  GENERAL: alert, oriented, no acute distress detected, full vision exam deferred due to pandemic and/or virtual encounter  HEENT: atraumatic, conjunttiva clear, no obvious abnormalities on inspection of external nose and ears  NECK: normal movements of the head and neck  LUNGS: on inspection no signs of respiratory distress, breathing rate appears normal, no obvious gross SOB, gasping or wheezing  CV: no obvious cyanosis  MS: moves all visible extremities without noticeable abnormality  PSYCH/NEURO: pleasant and cooperative, no obvious depression or anxiety, speech and thought processing grossly intact, Cognitive function grossly intact  Flowsheet Row Clinical Support from 09/09/2024 in [facility] [place]  PHQ-9 Total Score 7        09/09/2024    9:53 AM 12/04/2023    3:23 PM 09/04/2023   11:28 AM 02/20/2023    9:29 AM 10/16/2022    1:51 PM  Depression screen PHQ 2/9  Decreased Interest 0 0 0 0 1  Down, Depressed, Hopeless 0 0 0 0 1  PHQ - 2 Score 0 0 0 0 2  Altered sleeping 1 3 3  0 2  Tired, decreased energy 1 2 1 1 1   Change in appetite 2 1 1 1 1   Feeling bad or failure about yourself  0 0 0 0 0  Trouble concentrating 3 3 3  0 0  Moving slowly or fidgety/restless 0 0 0 0 1  Suicidal thoughts 0 0 0 0 0  PHQ-9 Score 7 9  8  2  7    Difficult doing work/chores  Not difficult at all  Somewhat difficult Somewhat difficult     Data saved with a previous flowsheet row definition       12/04/2023    3:23 PM 08/25/2024   10:00 AM 09/08/2024    6:41 PM 09/09/2024    9:52 AM 09/09/2024   10:16 AM  Fall Risk  Falls in the past year? 0 0 0 0   Was there an injury with Fall? 0 0  0   Fall Risk Category Calculator 0 0  0   Patient at Risk for Falls Due to No Fall Risks No Fall Risks   No  Fall Risks  Fall risk Follow up Falls evaluation completed Falls evaluation completed  Falls evaluation completed Falls evaluation completed     SUMMARY AND PLAN:  Encounter for Medicare annual wellness exam   Discussed applicable health maintenance/preventive health measures and advised and referred or ordered per patient preferences:  Health Maintenance  Topic Date Due   COVID-19 Vaccine (12 -  Pfizer risk 2025-26 season) 01/07/2025   Mammogram  03/31/2025   Medicare Annual Wellness (AWV)  09/09/2025   Colonoscopy  05/27/2027   DTaP/Tdap/Td (4 - Td or Tdap) 01/26/2032   Pneumococcal Vaccine: 50+ Years  Completed   Influenza Vaccine  Completed   DEXA SCAN  Completed   Hepatitis C Screening  Completed   Zoster Vaccines- Shingrix  Completed   Meningococcal B Vaccine  Aged Out      Education and counseling on the following was provided based on the above review of health and a plan/checklist for the patient, along with additional information discussed, was provided for the patient in the patient instructions :   -Advised and counseled on a healthy lifestyle - including the importance of a healthy diet, regular physical activity, social connections and stress management. -Reviewed patient's current diet. Advised and counseled on a whole foods based healthy diet. A summary of a healthy diet was provided in the Patient Instructions.  -reviewed patient's current physical activity level and discussed exercise guidelines for adults. Discussed community resources and ideas for safe exercise at home to assist in meeting exercise guideline recommendations in a safe and healthy way.  -Advise yearly dental visits at minimum and regular eye exams -Dr. Johnny manages pain, stable, discussed risks and proper use Follow up: see patient instructions     Patient Instructions  I really enjoyed getting to talk with you today! I am available on Tuesdays and Thursdays for virtual visits if you have  any questions or concerns, or if I can be of any further assistance.   CHECKLIST FROM ANNUAL WELLNESS VISIT:  -Follow up (please call to schedule if not scheduled after visit):   -yearly for annual wellness visit with primary care office  Here is a list of your preventive care/health maintenance measures and the plan for each if any are due:  PLAN For any measures below that may be due:   Health Maintenance  Topic Date Due   COVID-19 Vaccine (12 - Pfizer risk 2025-26 season) 01/07/2025   Mammogram  03/31/2025   Medicare Annual Wellness (AWV)  09/09/2025   Colonoscopy  05/27/2027   DTaP/Tdap/Td (4 - Td or Tdap) 01/26/2032   Pneumococcal Vaccine: 50+ Years  Completed   Influenza Vaccine  Completed   DEXA SCAN  Completed   Hepatitis C Screening  Completed   Zoster Vaccines- Shingrix  Completed   Meningococcal B Vaccine  Aged Out    -See a dentist at least yearly  -Get your eyes checked and then per your eye specialist's recommendations  -Other issues addressed today:   -I have included below further information regarding a healthy whole foods based diet, physical activity guidelines for adults, stress management and opportunities for social connections. I hope you find this information useful.   -----------------------------------------------------------------------------------------------------------------------------------------------------------------------------------------------------------------------------------------------------------    NUTRITION: -eat real food: lots of colorful vegetables (half the plate) and fruits -5-7 servings of vegetables and fruits per day (fresh or steamed is best), exp. 2 servings of vegetables with lunch and dinner and 2 servings of fruit per day. Berries and greens such as kale and collards are great choices.  -consume on a regular basis:  fresh fruits, fresh veggies, fish, nuts, seeds, healthy oils (such as olive oil, avocado oil), whole  grains (make sure for bread/pasta/crackers/etc., that the first ingredient on label contains the word whole), legumes. -can eat small amounts of dairy and lean meat (no larger than the palm of your hand), but avoid processed meats such  as ham, bacon, lunch meat, etc. -drink water  -try to avoid fast food and pre-packaged foods, processed meat, ultra processed foods/beverages (donuts, candy, etc.) -most experts advise limiting sodium to < 2300mg  per day, should limit further is any chronic conditions such as high blood pressure, heart disease, diabetes, etc. The American Heart Association advised that < 1500mg  is is ideal -try to avoid foods/beverages that contain any ingredients with names you do not recognize  -try to avoid foods/beverages  with added sugar or sweeteners/sweets  -try to avoid sweet drinks (including diet drinks): soda, juice, Gatorade, sweet tea, power drinks, diet drinks -try to avoid white rice, white bread, pasta (unless whole grain)  EXERCISE GUIDELINES FOR ADULTS: -if you wish to increase your physical activity, do so gradually and with the approval of your doctor -STOP and seek medical care immediately if you have any chest pain, chest discomfort or trouble breathing when starting or increasing exercise  -move and stretch your body, legs, feet and arms when sitting for long periods -Physical activity guidelines for optimal health in adults: -get at least 150 minutes per week of moderate exercise (can talk, but not sing); this is about 20-30 minutes of sustained activity 5-7 days per week or two 10-15 minute episodes of sustained activity 5-7 days per week -do some muscle building/resistance training/strength training at least 2 days per week  -balance exercises 3+ days per week:   Stand somewhere where you have something sturdy to hold onto if you lose balance    1) lift up on toes, then back down, start with 5x per day and work up to 20x   2) stand and lift one leg  straight out to the side so that foot is a few inches of the floor, start with 5x each side and work up to 20x each side   3) stand on one foot, start with 5 seconds each side and work up to 20 seconds on each side  If you need ideas or help with getting more active:  -Silver sneakers https://tools.silversneakers.com  -Walk with a Doc: Http://www.duncan-williams.com/  -try to include resistance (weight lifting/strength building) and balance exercises twice per week: or the following link for ideas: http://castillo-powell.com/  buyducts.dk  STRESS MANAGEMENT: -can try meditating, or just sitting quietly with deep breathing while intentionally relaxing all parts of your body for 5 minutes daily -if you need further help with stress, anxiety or depression please follow up with your primary doctor or contact the wonderful folks at [facility]: [phone]  SOCIAL CONNECTIONS: -options in [place] if you wish to engage in more social and exercise related activities:  -Silver sneakers https://tools.silversneakers.com  -Walk with a Doc: Http://www.duncan-williams.com/  -Check out the [facility] Active Adults 50+ section on the [place] of [employer] companies (hiking clubs, book clubs, cards and games, chess, exercise classes, aquatic classes and much more) - see the website for details: https://www.[place].gov/departments/parks-recreation/active-adults50  -YouTube has lots of exercise videos for different ages and abilities as well  -[facility] (a variety of indoor and outdoor inperson activities for adults). [phone]. [address]  -Virtual Online Classes (a variety of topics): see seniorplanet.org or call [phone]  -consider volunteering at a school, hospice center, church, senior center or elsewhere            Victoria JONELLE Cramp, DO

## 2024-09-11 DIAGNOSIS — D2361 Other benign neoplasm of skin of right upper limb, including shoulder: Secondary | ICD-10-CM | POA: Diagnosis not present

## 2024-09-11 DIAGNOSIS — D2362 Other benign neoplasm of skin of left upper limb, including shoulder: Secondary | ICD-10-CM | POA: Diagnosis not present

## 2024-09-11 DIAGNOSIS — L814 Other melanin hyperpigmentation: Secondary | ICD-10-CM | POA: Diagnosis not present

## 2024-09-11 DIAGNOSIS — D225 Melanocytic nevi of trunk: Secondary | ICD-10-CM | POA: Diagnosis not present

## 2024-09-11 DIAGNOSIS — L821 Other seborrheic keratosis: Secondary | ICD-10-CM | POA: Diagnosis not present

## 2024-09-12 ENCOUNTER — Telehealth: Payer: Self-pay

## 2024-09-12 NOTE — Telephone Encounter (Signed)
 Copied from CRM [id number]. Topic: Clinical - Medication Question >> Sep 10, 2024  8:46 AM Deaijah H wrote: Reason for CRM: Patient called in stating she meant to mention to Chiquita Cramp during AWV that the reason she does not sleep good is because she has acid reflux & starts once she lays on her side and would like to know if she can switch from her current medication omeprazole  (PRILOSEC) 20 MG capsule to protonix  which is what she use to take. Please call to confirm. Call before 11 or after 130 @ [phone]

## 2024-09-15 ENCOUNTER — Telehealth: Payer: Self-pay | Admitting: *Deleted

## 2024-09-15 MED ORDER — PANTOPRAZOLE SODIUM 40 MG PO TBEC
40.0000 mg | DELAYED_RELEASE_TABLET | Freq: Every day | ORAL | 3 refills | Status: AC
Start: 2024-09-15 — End: ?

## 2024-09-15 NOTE — Telephone Encounter (Signed)
 Pt notified via MyChart.

## 2024-09-15 NOTE — Telephone Encounter (Signed)
 Copied from CRM #[id number]. Topic: General - Other >> Sep 15, 2024  1:12 PM Paige D wrote: Reason for CRM: Pt is calling in regards to a missed call from nancy called CAL nancy is with patients will call pt back when she's free please call pt cell   [phone]

## 2024-09-15 NOTE — Addendum Note (Signed)
 Addended by: JOHNNY SENIOR A on: 09/15/2024 08:11 AM   Modules accepted: Orders

## 2024-09-15 NOTE — Telephone Encounter (Signed)
 I sent in Protonix  40 mg daily

## 2024-09-16 NOTE — Telephone Encounter (Signed)
 Pt notified via MyChart aware that Rx quantity was changed with her pharmacy

## 2024-09-21 ENCOUNTER — Other Ambulatory Visit: Payer: Self-pay | Admitting: Family Medicine

## 2024-12-02 ENCOUNTER — Telehealth: Payer: Self-pay | Admitting: Family Medicine

## 2024-12-02 ENCOUNTER — Telehealth: Payer: Self-pay

## 2024-12-02 DIAGNOSIS — E559 Vitamin D deficiency, unspecified: Secondary | ICD-10-CM

## 2024-12-02 NOTE — Telephone Encounter (Signed)
 Copied from CRM [id number]. Topic: Clinical - Medication Refill >> Dec 02, 2024  2:43 PM [name] wrote: Medication: traMADol  (ULTRAM ) 50 MG tablet  Has the patient contacted their pharmacy? No (Agent: If no, request that the patient contact the pharmacy for the refill. If patient does not wish to contact the pharmacy document the reason why and proceed with request.) (Agent: If yes, when and what did the pharmacy advise?)  This is the patient's preferred pharmacy:  [facility] [address], [place] - [address] [place] [address] Phone: [phone] Fax: [phone]   Is this the correct pharmacy for this prescription? Yes If no, delete pharmacy and type the correct one.   Has the prescription been filled recently? Yes  Is the patient out of the medication? No  Has the patient been seen for an appointment in the last year OR does the patient have an upcoming appointment? Yes  Can we respond through MyChart? No  Agent: Please be advised that Rx refills may take up to 3 business days. We ask that you follow-up with your pharmacy.

## 2024-12-03 MED ORDER — TRAMADOL HCL 50 MG PO TABS: 100.0000 mg | ORAL_TABLET | Freq: Four times a day (QID) | ORAL | 2 refills | Status: AC | PRN

## 2024-12-03 NOTE — Telephone Encounter (Signed)
 Done

## 2024-12-03 NOTE — Addendum Note (Signed)
 Addended by: JOHNNY SENIOR A on: 12/03/2024 12:52 PM   Modules accepted: Orders

## 2024-12-03 NOTE — Telephone Encounter (Signed)
 I ordered the vitamin D level

## 2024-12-04 NOTE — Telephone Encounter (Signed)
 Spoke with pt lab app scheduled to check Vit D level. Pt state to let Dr Johnny know that she needs a prescription for a scooter and a letter stating that she qualifies  for a scooter due to her ankle pain. Pt is aware that she may need in person visit for this. Please advise

## 2024-12-04 NOTE — Telephone Encounter (Signed)
Set up an in person OV for this  

## 2024-12-05 NOTE — Telephone Encounter (Signed)
 Pt has been scheduled for OV on 12/09/24

## 2024-12-09 ENCOUNTER — Other Ambulatory Visit

## 2024-12-09 ENCOUNTER — Ambulatory Visit: Admitting: Family Medicine

## 2025-03-09 ENCOUNTER — Encounter: Admitting: Family Medicine

## 2025-08-26 ENCOUNTER — Encounter: Admitting: Nurse Practitioner

## 2025-09-09 ENCOUNTER — Encounter: Admitting: Nurse Practitioner

## 2025-09-11 ENCOUNTER — Ambulatory Visit
